# Patient Record
Sex: Male | Born: 1950 | Race: Black or African American | Hispanic: No | Marital: Married | Smoking: Former smoker
Health system: Southern US, Community
[De-identification: ages and names within clinical notes are randomized; demographics above are authoritative.]

## PROBLEM LIST (undated history)

## (undated) DIAGNOSIS — I10 Essential (primary) hypertension: Secondary | ICD-10-CM

## (undated) DIAGNOSIS — C801 Malignant (primary) neoplasm, unspecified: Secondary | ICD-10-CM

## (undated) DIAGNOSIS — E119 Type 2 diabetes mellitus without complications: Secondary | ICD-10-CM

## (undated) DIAGNOSIS — R0600 Dyspnea, unspecified: Secondary | ICD-10-CM

## (undated) HISTORY — PX: CARDIAC SURGERY: SHX584

## (undated) HISTORY — PX: OTHER SURGICAL HISTORY: SHX169

---

## 1997-08-03 ENCOUNTER — Emergency Department (HOSPITAL_COMMUNITY): Admission: EM | Admit: 1997-08-03 | Discharge: 1997-08-03 | Payer: Self-pay | Admitting: Emergency Medicine

## 1997-08-16 ENCOUNTER — Emergency Department (HOSPITAL_COMMUNITY): Admission: EM | Admit: 1997-08-16 | Discharge: 1997-08-16 | Payer: Self-pay | Admitting: Emergency Medicine

## 1997-10-16 ENCOUNTER — Ambulatory Visit (HOSPITAL_COMMUNITY): Admission: RE | Admit: 1997-10-16 | Discharge: 1997-10-16 | Payer: Self-pay | Admitting: Pulmonary Disease

## 1997-10-23 ENCOUNTER — Ambulatory Visit (HOSPITAL_COMMUNITY): Admission: RE | Admit: 1997-10-23 | Discharge: 1997-10-23 | Payer: Self-pay | Admitting: Pulmonary Disease

## 1998-10-21 ENCOUNTER — Emergency Department (HOSPITAL_COMMUNITY): Admission: EM | Admit: 1998-10-21 | Discharge: 1998-10-21 | Payer: Self-pay | Admitting: Emergency Medicine

## 1999-08-06 ENCOUNTER — Ambulatory Visit (HOSPITAL_COMMUNITY): Admission: RE | Admit: 1999-08-06 | Discharge: 1999-08-06 | Payer: Self-pay | Admitting: Pulmonary Disease

## 1999-08-06 ENCOUNTER — Encounter: Payer: Self-pay | Admitting: Pulmonary Disease

## 2000-07-06 ENCOUNTER — Emergency Department (HOSPITAL_COMMUNITY): Admission: EM | Admit: 2000-07-06 | Discharge: 2000-07-06 | Payer: Self-pay

## 2001-07-07 ENCOUNTER — Encounter: Payer: Self-pay | Admitting: Pulmonary Disease

## 2001-07-07 ENCOUNTER — Ambulatory Visit (HOSPITAL_COMMUNITY): Admission: RE | Admit: 2001-07-07 | Discharge: 2001-07-07 | Payer: Self-pay | Admitting: Pulmonary Disease

## 2001-11-15 ENCOUNTER — Encounter: Payer: Self-pay | Admitting: Pulmonary Disease

## 2001-11-15 ENCOUNTER — Ambulatory Visit (HOSPITAL_COMMUNITY): Admission: RE | Admit: 2001-11-15 | Discharge: 2001-11-15 | Payer: Self-pay | Admitting: Pulmonary Disease

## 2003-01-23 ENCOUNTER — Ambulatory Visit: Admission: RE | Admit: 2003-01-23 | Discharge: 2003-04-23 | Payer: Self-pay | Admitting: Radiation Oncology

## 2003-04-24 ENCOUNTER — Ambulatory Visit: Admission: RE | Admit: 2003-04-24 | Discharge: 2003-07-23 | Payer: Self-pay | Admitting: Radiation Oncology

## 2003-05-30 ENCOUNTER — Ambulatory Visit (HOSPITAL_COMMUNITY): Admission: RE | Admit: 2003-05-30 | Discharge: 2003-05-30 | Payer: Self-pay | Admitting: Pulmonary Disease

## 2003-11-07 ENCOUNTER — Ambulatory Visit: Admission: RE | Admit: 2003-11-07 | Discharge: 2003-11-07 | Payer: Self-pay | Admitting: Radiation Oncology

## 2004-03-18 ENCOUNTER — Emergency Department (HOSPITAL_COMMUNITY): Admission: EM | Admit: 2004-03-18 | Discharge: 2004-03-18 | Payer: Self-pay | Admitting: Emergency Medicine

## 2005-12-30 ENCOUNTER — Emergency Department (HOSPITAL_COMMUNITY): Admission: EM | Admit: 2005-12-30 | Discharge: 2005-12-30 | Payer: Self-pay | Admitting: Emergency Medicine

## 2006-12-07 ENCOUNTER — Emergency Department (HOSPITAL_COMMUNITY): Admission: EM | Admit: 2006-12-07 | Discharge: 2006-12-07 | Payer: Self-pay | Admitting: Emergency Medicine

## 2007-01-27 ENCOUNTER — Inpatient Hospital Stay (HOSPITAL_COMMUNITY): Admission: EM | Admit: 2007-01-27 | Discharge: 2007-01-31 | Payer: Self-pay | Admitting: Emergency Medicine

## 2007-02-18 ENCOUNTER — Encounter (HOSPITAL_COMMUNITY): Admission: RE | Admit: 2007-02-18 | Discharge: 2007-05-05 | Payer: Self-pay | Admitting: Cardiology

## 2007-03-12 ENCOUNTER — Ambulatory Visit (HOSPITAL_COMMUNITY): Admission: RE | Admit: 2007-03-12 | Discharge: 2007-03-12 | Payer: Self-pay | Admitting: Cardiology

## 2007-05-06 ENCOUNTER — Encounter (HOSPITAL_COMMUNITY): Admission: RE | Admit: 2007-05-06 | Discharge: 2007-07-03 | Payer: Self-pay | Admitting: Cardiology

## 2008-02-25 ENCOUNTER — Ambulatory Visit (HOSPITAL_COMMUNITY): Admission: RE | Admit: 2008-02-25 | Discharge: 2008-02-25 | Payer: Self-pay | Admitting: Cardiology

## 2008-12-16 ENCOUNTER — Observation Stay (HOSPITAL_COMMUNITY): Admission: EM | Admit: 2008-12-16 | Discharge: 2008-12-19 | Payer: Self-pay | Admitting: Emergency Medicine

## 2010-03-07 ENCOUNTER — Ambulatory Visit (HOSPITAL_COMMUNITY): Admission: RE | Admit: 2010-03-07 | Discharge: 2010-03-07 | Payer: Self-pay | Admitting: Pulmonary Disease

## 2010-06-13 ENCOUNTER — Encounter (HOSPITAL_COMMUNITY)
Admission: RE | Admit: 2010-06-13 | Discharge: 2010-06-13 | Disposition: A | Discharge status: Home / Self Care | Payer: BC Managed Care – PPO | Source: Ambulatory Visit | Attending: Orthopaedic Surgery | Admitting: Orthopaedic Surgery

## 2010-06-13 ENCOUNTER — Ambulatory Visit (HOSPITAL_COMMUNITY)
Admission: RE | Admit: 2010-06-13 | Discharge: 2010-06-13 | Disposition: A | Discharge status: Home / Self Care | Payer: BC Managed Care – PPO | Source: Ambulatory Visit | Attending: Orthopaedic Surgery | Admitting: Orthopaedic Surgery

## 2010-06-13 ENCOUNTER — Other Ambulatory Visit (HOSPITAL_COMMUNITY): Payer: Self-pay | Admitting: Orthopaedic Surgery

## 2010-06-13 DIAGNOSIS — M48061 Spinal stenosis, lumbar region without neurogenic claudication: Secondary | ICD-10-CM

## 2010-06-13 DIAGNOSIS — Z01818 Encounter for other preprocedural examination: Secondary | ICD-10-CM | POA: Insufficient documentation

## 2010-06-13 LAB — SURGICAL PCR SCREEN
MRSA, PCR: NEGATIVE
Staphylococcus aureus: POSITIVE — AB

## 2010-06-13 LAB — COMPREHENSIVE METABOLIC PANEL
AST: 25 U/L (ref 0–37)
Alkaline Phosphatase: 51 U/L (ref 39–117)
Calcium: 9.3 mg/dL (ref 8.4–10.5)
Creatinine, Ser: 1.05 mg/dL (ref 0.4–1.5)
Glucose, Bld: 145 mg/dL — ABNORMAL HIGH (ref 70–99)
Potassium: 4.7 mEq/L (ref 3.5–5.1)
Total Bilirubin: 0.9 mg/dL (ref 0.3–1.2)
Total Protein: 6.2 g/dL (ref 6.0–8.3)

## 2010-06-13 LAB — DIFFERENTIAL
Basophils Relative: 1 % (ref 0–1)
Eosinophils Absolute: 0.1 10*3/uL (ref 0.0–0.7)
Eosinophils Relative: 2 % (ref 0–5)
Neutro Abs: 4.7 10*3/uL (ref 1.7–7.7)
Neutrophils Relative %: 57 % (ref 43–77)

## 2010-06-13 LAB — CBC
MCH: 33.2 pg (ref 26.0–34.0)
RDW: 12.8 % (ref 11.5–15.5)
WBC: 8.2 10*3/uL (ref 4.0–10.5)

## 2010-06-14 ENCOUNTER — Other Ambulatory Visit (HOSPITAL_COMMUNITY): Payer: Self-pay | Admitting: Orthopaedic Surgery

## 2010-06-14 ENCOUNTER — Observation Stay (HOSPITAL_COMMUNITY)
Admission: RE | Admit: 2010-06-14 | Discharge: 2010-06-15 | Disposition: A | Discharge status: Home / Self Care | Payer: BC Managed Care – PPO | Source: Ambulatory Visit | Attending: Orthopaedic Surgery | Admitting: Orthopaedic Surgery

## 2010-06-14 ENCOUNTER — Ambulatory Visit (HOSPITAL_COMMUNITY)
Admission: RE | Admit: 2010-06-14 | Discharge: 2010-06-14 | Disposition: A | Discharge status: Home / Self Care | Payer: BC Managed Care – PPO | Source: Ambulatory Visit | Attending: Orthopaedic Surgery | Admitting: Orthopaedic Surgery

## 2010-06-14 DIAGNOSIS — M545 Low back pain, unspecified: Secondary | ICD-10-CM

## 2010-06-14 DIAGNOSIS — M48061 Spinal stenosis, lumbar region without neurogenic claudication: Secondary | ICD-10-CM | POA: Insufficient documentation

## 2010-06-14 DIAGNOSIS — Z01810 Encounter for preprocedural cardiovascular examination: Secondary | ICD-10-CM | POA: Insufficient documentation

## 2010-06-14 DIAGNOSIS — M8569 Other cyst of bone, multiple sites: Principal | ICD-10-CM | POA: Insufficient documentation

## 2010-06-14 LAB — GLUCOSE, CAPILLARY
Glucose-Capillary: 171 mg/dL — ABNORMAL HIGH (ref 70–99)
Glucose-Capillary: 179 mg/dL — ABNORMAL HIGH (ref 70–99)

## 2010-06-14 LAB — PROTIME-INR: Prothrombin Time: 12.7 seconds (ref 11.6–15.2)

## 2010-06-14 LAB — APTT: aPTT: 34 seconds (ref 24–37)

## 2010-06-15 LAB — GLUCOSE, CAPILLARY

## 2010-06-17 LAB — GLUCOSE, CAPILLARY: Glucose-Capillary: 124 mg/dL — ABNORMAL HIGH (ref 70–99)

## 2010-06-18 NOTE — Op Note (Signed)
Zachary Knox, Zachary Knox               ACCOUNT NO.:  [id number]  MEDICAL RECORD NO.:  [id number]           PATIENT TYPE:  I  LOCATION:  5015                         FACILITY:  [facility]  PHYSICIAN:  Mark C. Ophelia Charter, M.D.    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  PROCEDURES:  Left L3 partial hemilaminotomy.  Foraminotomy and removal of compressive facet cyst.  SURGEON:  Mark C. Ophelia Charter, MD  ASSISTANT:  Wende Neighbors, PA-C  ANESTHESIA:  GOT plus Marcaine skin local.  This 60 year old male has had progressive increased back pain, leg pain, and left leg weakness and over the last 6-9 months has had progressive enlargement of facet cyst, now causing severe stenosis which is really pseudostenosis from the cyst.  There was no contralateral right foraminal stenosis.  He had increasing pain, weakness, and leg giving way in his left quad.  PROCEDURE:  After induction of general anesthesia, the patient was placed in prone position, chest rolls, careful padding and positioning. Back was prepped with DuraPrep.  Preoperative Ancef was given.  Area was squared with towels.  Betadine and Steri-Drape were applied after the standard DuraPrep and squaring it with towels.  Laminectomy sheet, spinal needle placed, 3-4 cross-table lateral x-ray.  Cross-table lateral confirmed that it was at 3-4 level.  A midline incision was made.  Foraminotomy was performed on the left.  Lamina was thinned with the bur.  2 and 3 mm Kerrisons were used to remove the remaining portions of the lamina and then the wall out to the level of pedicle. There were thick chunks of ligamentum which were carefully removed after probing with the operative microscope draped and  brought in to make sure that there was not any adherent dura.  The cyst was large and actually cyst itself was larger than the size of the dura by MRI was causing significant compression.  Microdissection developed the layer where it was somewhat adherent to the dura, released some scar tissue once it was isolated.  Chunks were then removed with the large pituitary and 3 and 4- mm Kerrison with the dura, gently protected with the D'Errico. Laminotomy was removed above and below, and the entire top to bottom portion of the disk was visualized and halfway up the vertebral body on the floor to make sure that there was no remaining cyst cephalad or caudad to cause any remaining compression.  Foramen was enlarged passing with a hockey stick underneath the nerve root, on the shoulder of the nerve root was performed.  No central area of compression.  Disk was flat.  No microdiskectomy was performed.  Copious irrigation was then gained with palpation.  Operative site was dry.  Some veins in the lateral gutter were coagulated with bipolar during the case under microscopic visualization.  Dura was intact, round, free.  No areas of compression along the lateral gutter.  Irrigation with saline  solution in standard layered closure with 0 Vicryl on the deep fascia, 2- 0 on the subcutaneous tissue, subcuticular skin closure.  Tincture of benzoin and Steri-Strips, Marcaine infiltration, postop dressing, and transferred to the recovery room.  A time-out procedure was completed at the time of closure.     Mark C. Ophelia Charter, M.D.     MCY/MEDQ  D:  06/14/2010  T:  06/15/2010  Job:  [id number]  Electronically Signed by Annell Greening M.D. on 06/18/2010 05:07:43 PM

## 2010-08-10 LAB — GLUCOSE, CAPILLARY
Glucose-Capillary: 118 mg/dL — ABNORMAL HIGH (ref 70–99)
Glucose-Capillary: 148 mg/dL — ABNORMAL HIGH (ref 70–99)
Glucose-Capillary: 149 mg/dL — ABNORMAL HIGH (ref 70–99)
Glucose-Capillary: 149 mg/dL — ABNORMAL HIGH (ref 70–99)

## 2010-08-10 LAB — HEPARIN LEVEL (UNFRACTIONATED): Heparin Unfractionated: 0.12 IU/mL — ABNORMAL LOW (ref 0.30–0.70)

## 2010-08-10 LAB — CBC
RBC: 4.38 MIL/uL (ref 4.22–5.81)
WBC: 6.6 10*3/uL (ref 4.0–10.5)

## 2010-08-11 LAB — URINALYSIS, ROUTINE W REFLEX MICROSCOPIC
Bilirubin Urine: NEGATIVE
Ketones, ur: NEGATIVE mg/dL
Nitrite: NEGATIVE
Protein, ur: NEGATIVE mg/dL
Specific Gravity, Urine: 1.022 (ref 1.005–1.030)
Urobilinogen, UA: 1 mg/dL (ref 0.0–1.0)

## 2010-08-11 LAB — CBC
HCT: 44.1 % (ref 39.0–52.0)
Hemoglobin: 15.1 g/dL (ref 13.0–17.0)
MCHC: 34.3 g/dL (ref 30.0–36.0)
MCHC: 34.4 g/dL (ref 30.0–36.0)
MCV: 98 fL (ref 78.0–100.0)
MCV: 99.2 fL (ref 78.0–100.0)
Platelets: 237 10*3/uL (ref 150–400)
RBC: 4.45 MIL/uL (ref 4.22–5.81)
RDW: 13.7 % (ref 11.5–15.5)
RDW: 14.2 % (ref 11.5–15.5)

## 2010-08-11 LAB — DIFFERENTIAL
Eosinophils Relative: 3 % (ref 0–5)
Lymphocytes Relative: 17 % (ref 12–46)
Lymphs Abs: 1.5 10*3/uL (ref 0.7–4.0)

## 2010-08-11 LAB — GLUCOSE, CAPILLARY
Glucose-Capillary: 156 mg/dL — ABNORMAL HIGH (ref 70–99)
Glucose-Capillary: 159 mg/dL — ABNORMAL HIGH (ref 70–99)
Glucose-Capillary: 159 mg/dL — ABNORMAL HIGH (ref 70–99)

## 2010-08-11 LAB — HEMOGLOBIN A1C
Hgb A1c MFr Bld: 7.4 % — ABNORMAL HIGH (ref 4.6–6.1)
Mean Plasma Glucose: 166 mg/dL

## 2010-08-11 LAB — HEPARIN LEVEL (UNFRACTIONATED)
Heparin Unfractionated: 0.72 IU/mL — ABNORMAL HIGH (ref 0.30–0.70)
Heparin Unfractionated: 0.94 IU/mL — ABNORMAL HIGH (ref 0.30–0.70)

## 2010-08-11 LAB — LIPASE, BLOOD: Lipase: 27 U/L (ref 11–59)

## 2010-08-11 LAB — POCT CARDIAC MARKERS
CKMB, poc: <1 ng/mL — ABNORMAL LOW (ref 1.0–8.0)
Troponin i, poc: <0.05 ng/mL (ref 0.00–0.09)

## 2010-08-11 LAB — COMPREHENSIVE METABOLIC PANEL
AST: 27 U/L (ref 0–37)
Albumin: 3.7 g/dL (ref 3.5–5.2)
CO2: 25 mEq/L (ref 19–32)
Calcium: 9.3 mg/dL (ref 8.4–10.5)
Creatinine, Ser: 1.09 mg/dL (ref 0.4–1.5)
GFR calc Af Amer: >60 mL/min (ref >60)
GFR calc non Af Amer: >60 mL/min (ref >60)

## 2010-08-11 LAB — CARDIAC PANEL(CRET KIN+CKTOT+MB+TROPI): Troponin I: 0.03 ng/mL (ref 0.00–0.06)

## 2010-08-11 LAB — LIPID PANEL
HDL: 34 mg/dL — ABNORMAL LOW (ref >39)
Total CHOL/HDL Ratio: 3.1 RATIO

## 2010-08-11 LAB — CK TOTAL AND CKMB (NOT AT ARMC): CK, MB: 2.7 ng/mL (ref 0.3–4.0)

## 2010-08-11 LAB — TROPONIN I: Troponin I: 0.02 ng/mL (ref 0.00–0.06)

## 2010-09-10 ENCOUNTER — Ambulatory Visit (HOSPITAL_COMMUNITY)
Admission: RE | Admit: 2010-09-10 | Discharge: 2010-09-10 | Disposition: A | Discharge status: Home / Self Care | Payer: BC Managed Care – PPO | Source: Ambulatory Visit | Attending: Cardiology | Admitting: Cardiology

## 2010-09-10 DIAGNOSIS — E119 Type 2 diabetes mellitus without complications: Secondary | ICD-10-CM | POA: Insufficient documentation

## 2010-09-10 DIAGNOSIS — Z9861 Coronary angioplasty status: Secondary | ICD-10-CM | POA: Insufficient documentation

## 2010-09-10 DIAGNOSIS — I252 Old myocardial infarction: Secondary | ICD-10-CM | POA: Insufficient documentation

## 2010-09-10 DIAGNOSIS — R0602 Shortness of breath: Secondary | ICD-10-CM | POA: Insufficient documentation

## 2010-09-10 DIAGNOSIS — I2510 Atherosclerotic heart disease of native coronary artery without angina pectoris: Secondary | ICD-10-CM | POA: Insufficient documentation

## 2010-09-10 DIAGNOSIS — F17200 Nicotine dependence, unspecified, uncomplicated: Secondary | ICD-10-CM | POA: Insufficient documentation

## 2010-09-10 DIAGNOSIS — Z8249 Family history of ischemic heart disease and other diseases of the circulatory system: Secondary | ICD-10-CM | POA: Insufficient documentation

## 2010-09-10 DIAGNOSIS — I10 Essential (primary) hypertension: Secondary | ICD-10-CM | POA: Insufficient documentation

## 2010-09-10 DIAGNOSIS — E7800 Pure hypercholesterolemia, unspecified: Secondary | ICD-10-CM | POA: Insufficient documentation

## 2010-09-10 DIAGNOSIS — E039 Hypothyroidism, unspecified: Secondary | ICD-10-CM | POA: Insufficient documentation

## 2010-09-10 DIAGNOSIS — R079 Chest pain, unspecified: Secondary | ICD-10-CM | POA: Insufficient documentation

## 2010-09-10 LAB — GLUCOSE, CAPILLARY: Glucose-Capillary: 196 mg/dL — ABNORMAL HIGH (ref 70–99)

## 2010-09-17 NOTE — Cardiovascular Report (Signed)
Zachary Knox               ACCOUNT NO.:  [id number]   MEDICAL RECORD NO.:  [id number]          PATIENT TYPE:  INP   LOCATION:  3712                         FACILITY:  [facility]   PHYSICIAN:  Zachary Knox, M.D. DATE OF BIRTH:  1951-04-19   DATE OF PROCEDURE:  01/27/2007  DATE OF DISCHARGE:  01/31/2007                            CARDIAC CATHETERIZATION   PROCEDURE:  1. Left cardiac catheterization.  2. Selective left and right coronary angiography.  3. Left ventriculography via right groin using Judkins technique.   INDICATIONS FOR PROCEDURE:  Zachary Knox is a 60 year old black  male with past medical history significant for hypertension,  hypercholesteremia, hypothyroidism, tobacco abuse who came to the ER  complaining of retrosternal chest pain localized, grade 8/10 while  moving chairs without associated symptoms.  The patient received aspirin  and nitroglycerin without relief and then received morphine sulfate with  relief of chest pain.  Denies any shortness of breath, palpitation,  lightheadedness or syncope.  Denies such episodes of chest pain in the  past.  EKG done in the ER showed normal sinus rhythm with septal Q waves  and reverse R wave progression in V1-V3. States chest pain gets better  after burping. The patient was noted to have mildly elevated CPK-MB and  troponin I suggestive of non-Q-wave myocardial infarction. Due to  multiple risk factors and elevated cardiac enzymes, discussed with the  patient regarding left catheterization, possible PTCA stenting, its  risks and benefits; i.e., death, MI, stroke, need for emergency CABG,  risk of restenosis, local vascular complications, etc., and consented  for the procedure.   DESCRIPTION OF PROCEDURE:  After obtaining informed consent, the patient  was brought to the catheterization lab and was placed on fluoroscopy  table.  The right groin was prepped and draped in usual fashion; 2%  Xylocaine was used for  local anesthesia in the right groin. With the  help of thin-wall needle, a 6-French arterial sheath was placed.  The  sheath was aspirated and flushed.  Next, 6-French left Judkins catheter  was advanced over the wire under fluoroscopic guidance up to the  ascending aorta.  Wire was pulled out. The catheter was aspirated and  connected to the manifold.  Catheter was further advanced and engaged  into left coronary ostium.  Multiple views of the left system were  taken. Next, the catheter was disengaged and was pulled out over the  wire and was replaced with 6-French right Judkins catheter which was  advanced over the wire under fluoroscopic guidance to the ascending  aorta.  Wire was pulled out. The catheter was aspirated and connected to  the manifold.  Catheter was further advanced and engaged into right  coronary ostium.  Multiple views of the right system were taken.  Next,  the catheter was disengaged and was pulled out over the wire and was  replaced with 6-French pigtail catheter which was advanced over the wire  under fluoroscopic guidance up to the ascending aorta.  Wire was pulled  out. The catheter was aspirated and connected to the manifold.  Catheter  was further  advanced across aortic valve into the LV.  LV pressures were  recorded.  Next, left ventriculography was done in 30-degree RAO  position.  Post angiographic pressures were recorded from LV, and then  pullback pressures were recorded from the aorta.  There was no gradient  across the aortic valve.  Next, pigtail catheter was pulled out.  Sheaths were aspirated and flushed.   FINDINGS:  LV showed good  LV systolic function.  There was mild  inferior wall hypokinesia, EF of 50-55%.   Left main was patent.   LAD has 40% proximal stenosis and 20-30% mid stenosis and 50-60% ostial  diagonal-1 stenosis.   Left circumflex was 100% occluded proximally with TIMI-0 flow.  RCA has  15-20% proximal and 10-15% distal stenoses  providing collaterals to the  distal left circumflex. PDA has 20-30% distal stenosis.   INTERVENTIONAL PROCEDURE:  Successful PTCA to proximal 100% occluded  left circumflex done using 2.5 x 8 mm long Voyager balloon for  predilatation, and then 3.0 x 16 mm long TAXUS drug-eluting stent was  deployed at 15 atmospheres pressure in proximal left circumflex.  The  proximal left circumflex stent was post dilated using 3.25 x 8 mm long  PowerSail balloon going up to 18 atmospheres pressure.  Angiogram showed  filling defect beyond the distal edge of the stent at the bifurcation  with OM-1 and then PTCA to the left circumflex done using 3.0 x 8 mm  long Voyager balloon going up to 6 atmospheres pressure.  Angiogram  showed TIMI-3 distal flow without evidence of dissection or distal  embolization with persistent minimal haziness.  Lesion was dilated from  100% to 0% residual with excellent TIMI grade 3 distal flow.  The  patient received weight-based heparin, Integrilin, and total of 600 mg  of Plavix during the procedure.  The patient tolerated procedure well.  There are no complications.  The patient was transferred to the recovery  room in stable condition.      Zachary Knox. Zachary Knox, M.D.  Electronically Signed     MNH/MEDQ  D:  01/31/2007  T:  01/31/2007  Job:  [id number]   cc:   Zachary Knox, M.D.  Catheterization Lab

## 2010-09-17 NOTE — Discharge Summary (Signed)
NAMEPARKE, JANDREAU               ACCOUNT NO.:  [id number]   MEDICAL RECORD NO.:  [id number]          PATIENT TYPE:  INP   LOCATION:  3712                         FACILITY:  [facility]   PHYSICIAN:  Mohan N. Sharyn Lull, M.D. DATE OF BIRTH:  14-Dec-1950   DATE OF ADMISSION:  01/26/2007  DATE OF DISCHARGE:  01/31/2007                               DISCHARGE SUMMARY   ADMITTING DIAGNOSES:  1. Atypical chest, pain rule out myocardial infarction.  2. Hypertension.  3. Hypercholesteremia.  4. Tobacco abuse.  5. Positive family history of coronary artery disease.  6. Hypothyroidism.   DISCHARGE DIAGNOSIS:  1. Status post non-Q-wave myocardial infarction, status post      percutaneous transluminal cardiac angioplasty and stenting to left      circumflex.  2. Hypertension.  3. Hypercholesteremia.  4. Hypothyroidism.  5. Tobacco abuse.  6. Positive family history of coronary artery disease.   DISCHARGE HOME MEDICATIONS:  1. Enteric-coated aspirin 325 mg 1 tablet daily.  2. Plavix 75 mg 1 tablet daily with food.  3. Toprol XL 50 mg 1 tablet daily.  4. Altace 2.5 mg 1 capsule daily.  5. Lipitor 20 mg 1 tablet daily.  6. Zantac 150 mg twice daily.  7. Synthroid 100 mcg 1 tablet daily.  8. Nitrostat 0.4 mg sublingual. Use as directed.   DIET:  Low salt, low cholesterol.   Post PTCA and stent instructions have been given.   ACTIVITY:  Increase activity slowly but avoid any lifting, pushing or  pulling for 1 week.   FOLLOWUP:  Follow up with me in 1 week and Dr. Petra Kuba as scheduled.   CONDITION AT DISCHARGE:  Stable.   BRIEF HISTORY AND HOSPITAL COURSE:  Zachary Knox is a 60 year old  black male with past medical history significant for hypertension,  hypercholesteremia, hypothyroidism, tobacco abuse who came to the ER  complaining of retrosternal chest pain localized, grade 8/10 while  moving chairs without associated symptoms.  The patient received aspirin  and nitroglycerin  without relief and then received morphine sulfate with  relief of chest pain.  Denies any shortness of breath, palpitations,  lightheadedness or syncope.  Denies such episodes of chest pain in the  past.  EKG done in the ER showed normal sinus rhythm with septal Q-waves  with reverse R-wave progression in V1-V3.  Denies any cough, fever,  chills.  States chest pain feels better after burping.   PAST MEDICAL HISTORY:  As above.   PAST SURGICAL HISTORY:  None.   ALLERGIES:  None.   MEDICATION AT HOME:  He is on Zantac, Bentyl, Benicar, Synthroid and  Lipitor.   SOCIAL HISTORY:  He is single, one child.  Smoked 1/2 pack per day for  38 years.  No history of alcohol abuse.  Used to drink socially.  He  works as Naval architect   FAMILY HISTORY:  Father died of MI at the age of 18.  Mother died of  ruptured aneurysm at age of 44.  One sister had heart problems and  pacemaker.   PHYSICAL EXAMINATION:  GENERAL:  He was alert,  awake, oriented x3 in no  acute distress.  VITAL SIGNS:  Blood pressure was 117/73, pulse 69 and regular.  HEENT:  Conjunctivae was pink.  NECK:  Supple, no JVD, no bruit.  LUNGS:  Clear to auscultation without rhonchi or rales.  CARDIOVASCULAR:  S1 and S2 were normal.  There was soft systolic murmur.  ABDOMEN:  Soft.  Bowel sounds were present, nontender.  EXTREMITIES:  There was no clubbing, cyanosis or edema.   LABORATORY DATA THIS ADMISSION:  CPK-MB was 1.3; second was 1.1.  Troponin I two sets were 0.05 x2. Creatinine was 0.9.  Repeat CPK was  278, MB of 16.3, relative index 5.9.  Troponin I was 0.24.  His next CK  was 1531, MB 230, and relative index 15.1. Potassium was 4.0, BUN 9,  creatinine 1.0.  Troponin I was 29.16.  Today next set CPK was 598, MB  21.5, relative index 4.2.  Troponin I was 12.07.  Next CPK was 278, MB  4.1, troponin 10.89.  Today labs show CPK 197, MB 2.0.  Troponin I was  5.99.  TSH is 0.14,   BRIEF HOSPITAL COURSE:  The patient on  was taken to the catheterization  lab and had PTCA and stenting to left circumflex as per procedure  report.  The patient tolerated procedure well.  There were no  complications..  Phase I cardiac rehab was called.  The patient has been  ambulating in hallway without any problems.  Postprocedure, the patient  did not have any episodes of chest pain during the hospital stay.  His  groin is stable.  He has no evidence of hematoma or bruit.  The patient  will be discharged home on above medications and will be followed up in  my office in 1 week and with Dr. Petra Kuba as scheduled.  The patient  will be referred for phase II cardiac rehab as outpatient.     Eduardo Osier. Sharyn Lull, M.D.  Electronically Signed    MNH/MEDQ  D:  01/31/2007  T:  01/31/2007  Job:  [id number]   cc:   Mina Marble, M.D.

## 2010-09-17 NOTE — Discharge Summary (Signed)
NAMEETIENNE, MOWERS               ACCOUNT NO.:  [id number]   MEDICAL RECORD NO.:  [id number]          PATIENT TYPE:  OBV   LOCATION:  3729                         FACILITY:  [facility]   PHYSICIAN:  Mohan N. Sharyn Lull, M.D. DATE OF BIRTH:  02-May-1951   DATE OF ADMISSION:  12/16/2008  DATE OF DISCHARGE:  12/19/2008                               DISCHARGE SUMMARY   ADMITTING DIAGNOSES:  1. Unstable angina.  2. Rule out myocardial infarction.  3. Coronary artery disease.  4. 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  5. History of percutaneous transluminal coronary angioplasty stenting      to 100% occluded left circumflex.  6. Hypercholesteremia.  7. Hypertension.  8. Hypothyroidism.  9. Tobacco abuse.   FINAL DIAGNOSES:  1. Stable angina.  2. Myocardial infarction ruled out.  3. Negative Persantine Myoview.  4. Coronary artery disease.  5. 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  6. Status post percutaneous coronary intervention to 100% occluded      left circumflex.  7. Hypertension.  8. New onset diabetes mellitus.  9. Hypercholesteremia.  10.Hypothyroidism.  11.Tobacco abuse.   DISCHARGE HOME MEDICATIONS:  1. Enteric-coated aspirin 81 mg 1 tablet daily.  2. Plavix 75 mg 1 tablet daily with food.  3. Toprol-XL 50 mg 1 tablet daily.  4. Lipitor 20 mg 1 tablet daily.  5. Trilipix 135 mg 1 tablet daily.  6. Protonix 40 mg 1 tablet daily.  7. Levothyroxine 150 mcg 1 tablet daily.  8. Nitrostat 0.4 mg sublingual use as directed.  9. Metformin 500 mg 1 tablet twice daily.   DIET:  Low salt and low cholesterol, 1800 calories ADA diet.   ACTIVITY:  Increase activity as tolerated.   DISCHARGE INSTRUCTIONS:  Monitor blood sugar twice daily and chart.   FOLLOWUP:  Follow up with me in 1 week.   CONDITION AT DISCHARGE:  Stable.   BRIEF HISTORY AND HOSPITAL COURSE:  Mr. Baskette is a 60 year old black  male with past medical history  significant for coronary artery disease,  history of non-Q-wave myocardial infarction, hypertension,  hypothyroidism, tobacco abuse, and hypercholesteremia, was admitted by  Dr. Shana Chute on December 16, 2008, because of retrosternal chest pain.  He  described the chest pain as discomfort in the substernal region  radiating to the right side associated with mild dizziness.  There was  no palpitation, lightheadedness, or syncope.  The patient received  sublingual nitro in ED with partial relief of chest pain.  The patient  had EKG done, which showed no evidence of acute ischemic changes.  The  patient was admitted for further evaluation.   PAST MEDICAL HISTORY:  As above.   PAST SURGICAL HISTORY:  He continues to smoke occasionally.  No history  of alcohol abuse.   FAMILY HISTORY:  Positive for coronary artery disease.   PHYSICAL EXAMINATION:  GENERAL:  He is alert, awake, and oriented x3.  VITAL SIGNS:  Blood pressure was 116/91 and pulse was 74.  HEENT:  Unremarkable.  NECK:  No JVD or no bruit.  LUNGS:  Clear to auscultation.  CARDIOVASCULAR:  S1 and S2 was normal.  There was no S3 gallop or rub.  ABDOMEN:  Soft.  No masses.  EXTREMITIES:  There is no clubbing, cyanosis, or edema.   LABORATORIES:  Admission hemoglobin was 15.4, hematocrit 44.9, and white  count of 8.8.  Sodium was 136, potassium 4.0, glucose 155, BUN 15, and  creatinine 1.09.  Three sets of cardiac enzymes were negative.  Cholesterol was 106, triglyceride 160, HDL 34, and LDL of 40.  His  hemoglobin A1c was 7.4.  His CBGs during the hospital stay remained  between 120-160.   BRIEF HOSPITAL COURSE:  The patient was admitted to telemetry unit.  MI  was ruled out by serial enzymes and EKG.  The patient did not have any  further episodes of chest pain during the hospital stay.  The patient  underwent Persantine Myoview yesterday, which showed no evidence of  infarction or ischemia with normal EF of 61%.  The patient  has been  ambulating in the hallway without any problems.  The patient has been  counseled about stopping  the tobacco abuse to which he agrees.  The patient will be followed up  in my office in 1 week.  The patient has been advised to monitor blood  sugar twice daily and chart, and will be monitored closely as an  outpatient.  The patient will be scheduled for his outpatient diabetic  education classes.      Eduardo Osier. Sharyn Lull, M.D.  Electronically Signed     Eduardo Osier. Sharyn Lull, M.D.  Electronically Signed    MNH/MEDQ  D:  12/19/2008  T:  12/19/2008  Job:  [id number]

## 2010-09-22 NOTE — Cardiovascular Report (Signed)
Zachary Knox, Zachary Knox               ACCOUNT NO.:  [id number]  MEDICAL RECORD NO.:  [id number]           PATIENT TYPE:  O  LOCATION:  [facility]                         FACILITY:  [facility]  PHYSICIAN:  Traye Bates N. Sharyn Lull, M.D. DATE OF BIRTH:  07-Mar-1951  DATE OF PROCEDURE:  09/10/2010 DATE OF DISCHARGE:                           CARDIAC CATHETERIZATION   PROCEDURES: 1. Left cardiac catheterization with selective left and right coronary     angiography, left ventriculography via right groin using Judkins     technique. 2. Successful closure of arteriotomy using ProGlide Perclose without     complications.  INDICATIONS FOR PROCEDURE:  Mr. 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  States also lately gets short of breath with minimal exertion associated with feeling weak, tired, and fatigued, and no energy and have to stop while working.  Denies any palpitation, lightheadedness, or syncope.  Denies PND, orthopnea, or leg swelling.  Denies relation of chest pain to food, breathing, or movement.  PAST MEDICAL HISTORY:  As above.  PAST SURGICAL HISTORY:  None.  ALLERGIES:  No known drug allergies.  MEDICATIONS:  At home he is on; 1. Enteric-coated aspirin 81 mg p.o. daily. 2. Plavix 75 mg p.o. daily, which was recently stopped and then     restarted. 3. Toprol-XL 50 mg p.o. daily. 4. Lipitor 20 mg p.o. daily. 5. Metformin 500 mg p.o. b.i.d. 6. Synthroid 150 mcg p.o. daily. 7. Nitrostat sublingual p.r.n.  SOCIAL HISTORY:  He is married, has one child.  Smoked half pack per day for 38 years.  No history of alcohol abuse.  FAMILY HISTORY:  Father  died of MI at the age of 11.  Mother died of ruptured aneurysm at the age of 42.  One sister has heart problem and permanent pacemaker.  PHYSICAL EXAMINATION:  GENERAL:  He is alert, awake, and oriented x3, in no acute distress. VITAL SIGNS:  Blood pressure is 130/80, pulse of 68. HEENT:  Conjunctivae pink. NECK:  Supple.  No JVD, no bruit. LUNGS:  Clear to auscultation without rhonchi or rales. CARDIOVASCULAR:  S1, S2 is normal.  There is soft systolic murmur. ABDOMEN:  Soft.  Bowel sounds are present, nontender. EXTREMITIES:  There is no clubbing, cyanosis, or edema.  IMPRESSION: 1. New-onset angina, rule out progression of coronary artery disease. 2. Coronary artery disease. 3. History of non-Q-wave myocardial infarction in the past status post     percutaneous coronary intervention to left circumflex in the past. 4. Hypertension. 5. Non-insulin-dependent diabetes mellitus. 6. Hypothyroidism. 7. Hypercholesteremia. 8. Tobacco abuse. 9. Positive family history of coronary artery disease.  Discussed with the patient regarding noninvasive stress testing versus left cath, possible PTCA and stenting, its risks and benefits, i.e., death, MI, and stroke, need for emergency CABG, local vascular complications, risk of restenosis, etc.  He consented for PCI.  PROCEDURE IN DETAIL:  After obtaining the informed consent, the patient was brought to the cath lab and was placed on fluoroscopy table.  Right groin was prepped and draped in usual fashion.  Xylocaine 1% was used for local anesthesia in the right groin.  With the help of thin-wall needle,  5-French arterial sheath was placed.  The sheath was aspirated and flushed.  Next, 5-French left Judkins catheter was advanced over the wire under fluoroscopic guidance up to the ascending aorta.  Wire was pulled out, the catheter was aspirated and connected to the manifold. Catheter was further advanced and engaged into the left coronary  ostium. Multiple views of the left system were taken.  Next, catheter was disengaged and was pulled out over the wire and was replaced with 5- [place] right Judkins catheter, which was advanced over the wire under fluoroscopic guidance up to the ascending aorta.  Wire was pulled out. The catheter was aspirated and connected to the manifold.  Catheter was further advanced and engaged into right coronary ostium.  Multiple views of the right system were taken.  Next, the catheter was disengaged and was pulled out over the wire and was replaced with 5-French pigtail catheter, which was advanced over the wire under fluoroscopic guidance up to the ascending aorta.  Wire was pulled out, the catheter was aspirated and connected to the manifold.  Catheter was further advanced across the aortic valve into the LV.  LV pressures were recorded.  Next LV graft was done in 30-degree RAO position.  Post angiographic pressures were recorded from LV and then pullback pressures were recorded from aorta.  There was no gradient across the aortic valve. Next, the pigtail catheter was pulled out over the wire.  Sheaths were aspirated and flushed.  FINDINGS:  LV showed inferior mid and basal wall hypokinesia, EF of 50- 55%.  Left main was patent.  The LAD has 30-40% proximal and mid stenosis, which appears better than prior angiogram.  Diagonal 1 has 20- 30% ostial stenosis, which also appears to be better than prior angiogram.  Left circumflex has 10-20% ostial and proximal stenosis. Stented segment is widely patent.  OM1 is very, very small.  OM2 is moderate size and has mild disease.  OM3 is small, which is patent.  RCA has 10-15% proximal and mid stenosis.  PDA has 30-40% mid stenosis.  PLV branches were small, which were patent.  The patient tolerated the procedure well.  There were no complications.  Arteriotomy was closed by ProGlide Perclose without any complications.  The patient tolerated  the procedure well and was transferred to recovery room in stable condition.     Eduardo Osier. Sharyn Lull, M.D.     MNH/MEDQ  D:  09/10/2010  T:  09/10/2010  Job:  [id number]  Electronically Signed by Rinaldo Cloud M.D. on 09/22/2010 08:56:32 PM

## 2011-02-13 LAB — CBC
HCT: 34.1 — ABNORMAL LOW
HCT: 34.5 — ABNORMAL LOW
HCT: 40.5
Hemoglobin: 11.6 — ABNORMAL LOW
Hemoglobin: 14.2
MCHC: 33.8
MCV: 96.8
MCV: 97.1
Platelets: 196
Platelets: 204
Platelets: 255
RBC: 3.47 — ABNORMAL LOW
RBC: 3.56 — ABNORMAL LOW
RBC: 4.27
RDW: 13
WBC: 10.1
WBC: 11.2 — ABNORMAL HIGH
WBC: 14.2 — ABNORMAL HIGH
WBC: 9.7

## 2011-02-13 LAB — COMPREHENSIVE METABOLIC PANEL
ALT: 35
Albumin: 3.7
Alkaline Phosphatase: 79
BUN: 11
GFR calc Af Amer: >60
Potassium: 4.3
Sodium: 135
Total Protein: 6.7

## 2011-02-13 LAB — LIPID PANEL
Total CHOL/HDL Ratio: 3
VLDL: 18

## 2011-02-13 LAB — BASIC METABOLIC PANEL
BUN: 11
BUN: 7
BUN: 8
CO2: 21
CO2: 23
CO2: 26
Chloride: 104
Chloride: 106
Chloride: 109
Glucose, Bld: 108 — ABNORMAL HIGH
Glucose, Bld: 116 — ABNORMAL HIGH
Potassium: 4
Potassium: 4
Potassium: 4.2
Sodium: 140

## 2011-02-13 LAB — CK TOTAL AND CKMB (NOT AT ARMC)
CK, MB: 16.3 — ABNORMAL HIGH
Relative Index: 5.9 — ABNORMAL HIGH

## 2011-02-13 LAB — DIFFERENTIAL
Basophils Relative: 0
Basophils Relative: 1
Eosinophils Absolute: 0.2
Eosinophils Relative: 1
Lymphs Abs: 3.1
Monocytes Absolute: 0.4
Monocytes Relative: 4
Neutro Abs: 9.3 — ABNORMAL HIGH
Neutrophils Relative %: 70

## 2011-02-13 LAB — I-STAT 8, (EC8 V) (CONVERTED LAB)
Acid-base deficit: 2
Chloride: 107
Glucose, Bld: 155 — ABNORMAL HIGH
Hemoglobin: 14.6
Potassium: 3.6
Sodium: 139
TCO2: 21

## 2011-02-13 LAB — CARDIAC PANEL(CRET KIN+CKTOT+MB+TROPI)
CK, MB: 2
CK, MB: 294.9 — ABNORMAL HIGH
Relative Index: 14.2 — ABNORMAL HIGH
Relative Index: 18.8 — ABNORMAL HIGH
Relative Index: 4.2 — ABNORMAL HIGH
Total CK: 197
Troponin I: 12.03
Troponin I: 37.54

## 2011-02-13 LAB — HEPATIC FUNCTION PANEL
Bilirubin, Direct: 0.2
Indirect Bilirubin: 0.7
Total Protein: 6.4

## 2011-02-13 LAB — POCT CARDIAC MARKERS
Myoglobin, poc: 119
Operator id: 277751
Operator id: 277751

## 2011-02-13 LAB — APTT: aPTT: 31

## 2011-02-13 LAB — TSH: TSH: 0.154 — ABNORMAL LOW

## 2011-02-13 LAB — POCT I-STAT CREATININE: Operator id: 277751

## 2011-02-13 LAB — TROPONIN I: Troponin I: 10.89

## 2011-02-13 LAB — PLATELET COUNT: Platelets: 205

## 2011-04-10 ENCOUNTER — Other Ambulatory Visit: Payer: Self-pay | Admitting: Cardiology

## 2012-08-07 ENCOUNTER — Encounter (HOSPITAL_COMMUNITY): Payer: Self-pay | Admitting: *Deleted

## 2012-08-07 ENCOUNTER — Emergency Department (HOSPITAL_COMMUNITY)
Admission: EM | Admit: 2012-08-07 | Discharge: 2012-08-07 | Disposition: A | Discharge status: Home / Self Care | Payer: BC Managed Care – PPO | Attending: Emergency Medicine | Admitting: Emergency Medicine

## 2012-08-07 DIAGNOSIS — J029 Acute pharyngitis, unspecified: Secondary | ICD-10-CM

## 2012-08-07 DIAGNOSIS — Z87891 Personal history of nicotine dependence: Secondary | ICD-10-CM | POA: Insufficient documentation

## 2012-08-07 DIAGNOSIS — R05 Cough: Secondary | ICD-10-CM | POA: Insufficient documentation

## 2012-08-07 DIAGNOSIS — R059 Cough, unspecified: Secondary | ICD-10-CM | POA: Insufficient documentation

## 2012-08-07 DIAGNOSIS — R599 Enlarged lymph nodes, unspecified: Secondary | ICD-10-CM | POA: Insufficient documentation

## 2012-08-07 DIAGNOSIS — J3489 Other specified disorders of nose and nasal sinuses: Secondary | ICD-10-CM | POA: Insufficient documentation

## 2012-08-07 DIAGNOSIS — J019 Acute sinusitis, unspecified: Secondary | ICD-10-CM | POA: Insufficient documentation

## 2012-08-07 DIAGNOSIS — E119 Type 2 diabetes mellitus without complications: Secondary | ICD-10-CM | POA: Insufficient documentation

## 2012-08-07 HISTORY — DX: Type 2 diabetes mellitus without complications: E11.9

## 2012-08-07 MED ORDER — HYDROCOD POLST-CHLORPHEN POLST 10-8 MG/5ML PO LQCR: 5.0000 mL | Freq: Two times a day (BID) | ORAL | Status: DC | PRN

## 2012-08-07 MED ORDER — FLUTICASONE PROPIONATE 50 MCG/ACT NA SUSP: 2.0000 | Freq: Every day | NASAL | Status: DC

## 2012-08-07 MED ORDER — AMOXICILLIN 500 MG PO CAPS: 500.0000 mg | ORAL_CAPSULE | Freq: Three times a day (TID) | ORAL | Status: DC

## 2012-08-07 NOTE — ED Provider Notes (Signed)
History     CSN: [id number]  Arrival date & time 08/07/12  0525   First MD Initiated Contact with Patient 08/07/12 [phone]      Chief Complaint  Patient presents with  . Sore Throat  . Sinus Problem    (Consider location/radiation/quality/duration/timing/severity/associated sxs/prior treatment) HPI Comments: Pt presents to the ED for sinus congestion and sore throat x3 days.  Associated sx include an intermittent, non-productive cough.  Denies any chest pain, SOB, dizziness, fever, difficulty swallowing, nausea, vomiting, or diarrhea.  No sick contacts.  Has tried OTC cold medication and saline nasal spray without significant relief.  The history is provided by the patient.    Past Medical History  Diagnosis Date  . Diabetes mellitus without complication     Past Surgical History  Procedure Laterality Date  . Stent in heart    . Cardiac surgery      Heart attack in 2008 with stent placement    History reviewed. No pertinent family history.  History  Substance Use Topics  . Smoking status: Former Games developer  . Smokeless tobacco: Not on file  . Alcohol Use: Yes     Comment: occassional       Review of Systems  HENT: Positive for sore throat and sinus pressure.   All other systems reviewed and are negative.    Allergies  Review of patient's allergies indicates no known allergies.  Home Medications  No current outpatient prescriptions on file.  BP 133/98  Pulse 98  Temp(Src) 97.5 F (36.4 C) (Oral)  SpO2 96%  Physical Exam  Nursing note and vitals reviewed. Constitutional: He is oriented to person, place, and time. He appears well-developed and well-nourished.  HENT:  Head: Normocephalic and atraumatic. No trismus in the jaw.  Right Ear: Tympanic membrane and ear canal normal.  Left Ear: Tympanic membrane and ear canal normal.  Nose: Right sinus exhibits maxillary sinus tenderness and frontal sinus tenderness. Left sinus exhibits maxillary sinus tenderness  and frontal sinus tenderness.  Mouth/Throat: Uvula is midline and mucous membranes are normal. No oral lesions. No edematous. Posterior oropharyngeal erythema present. No oropharyngeal exudate, posterior oropharyngeal edema or tonsillar abscesses.  Frontal and maxillary sinus tenderness, turbinates swollen and erythematous  Eyes: Conjunctivae and EOM are normal. Pupils are equal, round, and reactive to light.  Neck: Normal range of motion. Neck supple.  Cardiovascular: Normal rate, regular rhythm and normal heart sounds.   Pulmonary/Chest: Effort normal and breath sounds normal. He has no wheezes. He has no rhonchi.  Abdominal: Soft. Bowel sounds are normal. There is no tenderness. There is no guarding.  Musculoskeletal: Normal range of motion. He exhibits no edema.  Lymphadenopathy:    He has cervical adenopathy (anterior).  Neurological: He is alert and oriented to person, place, and time.  Skin: Skin is warm and dry.  Psychiatric: He has a normal mood and affect.    ED Course  Procedures (including critical care time)  Labs Reviewed - No data to display No results found.   1. Acute sinusitis   2. Pharyngitis       MDM    Pt presenting to ED for sore throat and sinus congestion x3 days.  No SOB or wheezes, lungs CTAB- low suspicion for bronchitis or pneumonia.  Deferred rapid strep- will tx with abx regardless of result.  Rx amoxicillin, tussionex, and flonase.  FU with PCP if sx not improving.  Discussed plan with pt- he agreed.  Return precautions advised.  Garlon Hatchet, PA-C 08/07/12 [phone]

## 2012-08-07 NOTE — ED Provider Notes (Signed)
Medical screening examination/treatment/procedure(s) were performed by non-physician practitioner and as supervising physician I was immediately available for consultation/collaboration.  Olivia Mackie, MD 08/07/12 [phone]

## 2012-08-07 NOTE — ED Notes (Signed)
Pt stated that about 3-4 days ago he's been experiencing sore throat, head congestion, and problems with his sinus.

## 2012-08-07 NOTE — Discharge Instructions (Signed)
Take prescribed medications as directed. Follow up with your primary care physician if symptoms worsen or you feel you are not improving. Return to the ED for new concerns.

## 2012-08-21 ENCOUNTER — Emergency Department (HOSPITAL_COMMUNITY)
Admission: EM | Admit: 2012-08-21 | Discharge: 2012-08-21 | Disposition: A | Discharge status: Home / Self Care | Payer: BC Managed Care – PPO | Attending: Emergency Medicine | Admitting: Emergency Medicine

## 2012-08-21 ENCOUNTER — Encounter (HOSPITAL_COMMUNITY): Payer: Self-pay | Admitting: Family Medicine

## 2012-08-21 DIAGNOSIS — I10 Essential (primary) hypertension: Secondary | ICD-10-CM | POA: Insufficient documentation

## 2012-08-21 DIAGNOSIS — Z79899 Other long term (current) drug therapy: Secondary | ICD-10-CM | POA: Insufficient documentation

## 2012-08-21 DIAGNOSIS — R21 Rash and other nonspecific skin eruption: Secondary | ICD-10-CM

## 2012-08-21 DIAGNOSIS — E119 Type 2 diabetes mellitus without complications: Secondary | ICD-10-CM | POA: Insufficient documentation

## 2012-08-21 DIAGNOSIS — T783XXA Angioneurotic edema, initial encounter: Secondary | ICD-10-CM

## 2012-08-21 DIAGNOSIS — Z7982 Long term (current) use of aspirin: Secondary | ICD-10-CM | POA: Insufficient documentation

## 2012-08-21 DIAGNOSIS — Z87891 Personal history of nicotine dependence: Secondary | ICD-10-CM | POA: Insufficient documentation

## 2012-08-21 MED ORDER — DEXAMETHASONE SODIUM PHOSPHATE 10 MG/ML IJ SOLN
10.0000 mg | Freq: Once | INTRAMUSCULAR | Status: AC
  Administered 2012-08-21: 10 mg via INTRAMUSCULAR
  Filled 2012-08-21: qty 1

## 2012-08-21 MED ORDER — FAMOTIDINE IN NACL 20-0.9 MG/50ML-% IV SOLN
20.0000 mg | Freq: Once | INTRAVENOUS | Status: AC
  Administered 2012-08-21: 20 mg via INTRAVENOUS
  Filled 2012-08-21: qty 50

## 2012-08-21 MED ORDER — DIPHENHYDRAMINE HCL 25 MG PO CAPS: 25.0000 mg | ORAL_CAPSULE | Freq: Four times a day (QID) | ORAL | Status: DC | PRN

## 2012-08-21 MED ORDER — METHYLPREDNISOLONE SODIUM SUCC 125 MG IJ SOLR
125.0000 mg | Freq: Once | INTRAMUSCULAR | Status: AC
  Administered 2012-08-21: 125 mg via INTRAVENOUS
  Filled 2012-08-21: qty 2

## 2012-08-21 MED ORDER — DIPHENHYDRAMINE HCL 12.5 MG/5ML PO ELIX
12.5000 mg | ORAL_SOLUTION | Freq: Once | ORAL | Status: AC
  Administered 2012-08-21: 12.5 mg via ORAL
  Filled 2012-08-21: qty 10

## 2012-08-21 NOTE — ED Notes (Signed)
Per pt sts rash that started last night that covers his arms, back neck and face. Pt also has swelling to bottom lip. sts took some benadryl last night without relief.

## 2012-08-21 NOTE — ED Provider Notes (Signed)
History     CSN: [id number]  Arrival date & time 08/21/12  1191   First MD Initiated Contact with Patient 08/21/12 1004      Chief Complaint  Patient presents with  . Allergic Reaction    HPI Patient presents with rash which began last night and then started having lower lip swelling this morning.  Patient does not recall any new food or medication and is taken.  He denies any difficulty breathing or swallowing.  Patient's never had this happen before.  Patient currently takes a beta blocker but is not on an ACE inhibitor. Past Medical History  Diagnosis Date  . Diabetes mellitus without complication     Past Surgical History  Procedure Laterality Date  . Stent in heart    . Cardiac surgery      Heart attack in 2008 with stent placement    History reviewed. No pertinent family history.  History  Substance Use Topics  . Smoking status: Former Games developer  . Smokeless tobacco: Not on file  . Alcohol Use: Yes     Comment: occassional       Review of Systems All other systems reviewed and are negative Allergies  Review of patient's allergies indicates no known allergies.  Home Medications   Current Outpatient Rx  Name  Route  Sig  Dispense  Refill  . aspirin EC 81 MG tablet   Oral   Take 162 mg by mouth daily.         Marland Kitchen atorvastatin (LIPITOR) 20 MG tablet   Oral   Take 20 mg by mouth daily.         Marland Kitchen glimepiride (AMARYL) 4 MG tablet   Oral   Take 4 mg by mouth daily.         Marland Kitchen levothyroxine (SYNTHROID, LEVOTHROID) 125 MCG tablet   Oral   Take 125 mcg by mouth daily before breakfast.         . linagliptin (TRADJENTA) 5 MG TABS tablet   Oral   Take 5 mg by mouth daily.         . metFORMIN (GLUCOPHAGE) 1000 MG tablet   Oral   Take 1,000 mg by mouth 2 (two) times daily with a meal.         . metoprolol succinate (TOPROL-XL) 50 MG 24 hr tablet   Oral   Take 50 mg by mouth daily. Take with or immediately following a meal.         . ranitidine  (ZANTAC) 150 MG tablet   Oral   Take 450 mg by mouth daily.         . diphenhydrAMINE (BENADRYL) 25 mg capsule   Oral   Take 1 capsule (25 mg total) by mouth every 6 (six) hours as needed (Take for 2 days).   30 capsule   0     BP 128/93  Pulse 74  Temp(Src) 98.9 F (37.2 C) (Oral)  Resp 20  SpO2 96%  Physical Exam  Nursing note and vitals reviewed. Constitutional: He is oriented to person, place, and time. He appears well-developed and well-nourished. No distress.  HENT:  Head: Normocephalic and atraumatic.  Patient has swelling to the lower lip.  No evidence of tongue, uvula or airway difficulty.  Eyes: Pupils are equal, round, and reactive to light.  Neck: Normal range of motion.  Cardiovascular: Normal rate and intact distal pulses.   Pulmonary/Chest: No respiratory distress.  Abdominal: Normal appearance. He exhibits no distension.  Musculoskeletal: Normal range of motion.  Neurological: He is alert and oriented to person, place, and time. No cranial nerve deficit.  Skin: Skin is warm and dry. Rash noted.  Psychiatric: He has a normal mood and affect. His behavior is normal.    ED Course  Procedures (including critical care time) Meds ordered this encounter  Medications  . diphenhydrAMINE (BENADRYL) 12.5 MG/5ML elixir 12.5 mg    Sig:   . famotidine (PEPCID) IVPB 20 mg    Sig:   . methylPREDNISolone sodium succinate (SOLU-MEDROL) 125 mg/2 mL injection 125 mg    Sig:     Labs Reviewed - No data to display No results found.   1. Angioedema, initial encounter   2. Rash       MDM  After treatment in the ED the patient feels back to baseline and wants to go home.  Patient has had improvement in his symptoms and appears stable to go home.       Nelia Shi, MD 08/21/12 [phone]

## 2012-08-23 ENCOUNTER — Emergency Department (HOSPITAL_COMMUNITY)
Admission: EM | Admit: 2012-08-23 | Discharge: 2012-08-23 | Disposition: A | Discharge status: Home / Self Care | Payer: BC Managed Care – PPO | Attending: Emergency Medicine | Admitting: Emergency Medicine

## 2012-08-23 ENCOUNTER — Encounter (HOSPITAL_COMMUNITY): Payer: Self-pay | Admitting: Emergency Medicine

## 2012-08-23 DIAGNOSIS — Y929 Unspecified place or not applicable: Secondary | ICD-10-CM | POA: Insufficient documentation

## 2012-08-23 DIAGNOSIS — Y939 Activity, unspecified: Secondary | ICD-10-CM | POA: Insufficient documentation

## 2012-08-23 DIAGNOSIS — Z9861 Coronary angioplasty status: Secondary | ICD-10-CM | POA: Insufficient documentation

## 2012-08-23 DIAGNOSIS — R220 Localized swelling, mass and lump, head: Secondary | ICD-10-CM | POA: Insufficient documentation

## 2012-08-23 DIAGNOSIS — Z79899 Other long term (current) drug therapy: Secondary | ICD-10-CM | POA: Insufficient documentation

## 2012-08-23 DIAGNOSIS — T783XXA Angioneurotic edema, initial encounter: Secondary | ICD-10-CM | POA: Insufficient documentation

## 2012-08-23 DIAGNOSIS — Z87891 Personal history of nicotine dependence: Secondary | ICD-10-CM | POA: Insufficient documentation

## 2012-08-23 DIAGNOSIS — T783XXD Angioneurotic edema, subsequent encounter: Secondary | ICD-10-CM

## 2012-08-23 DIAGNOSIS — Z7982 Long term (current) use of aspirin: Secondary | ICD-10-CM | POA: Insufficient documentation

## 2012-08-23 DIAGNOSIS — T4995XA Adverse effect of unspecified topical agent, initial encounter: Secondary | ICD-10-CM | POA: Insufficient documentation

## 2012-08-23 DIAGNOSIS — T7840XD Allergy, unspecified, subsequent encounter: Secondary | ICD-10-CM

## 2012-08-23 DIAGNOSIS — E119 Type 2 diabetes mellitus without complications: Secondary | ICD-10-CM | POA: Insufficient documentation

## 2012-08-23 DIAGNOSIS — R221 Localized swelling, mass and lump, neck: Secondary | ICD-10-CM | POA: Insufficient documentation

## 2012-08-23 DIAGNOSIS — T65891A Toxic effect of other specified substances, accidental (unintentional), initial encounter: Secondary | ICD-10-CM | POA: Insufficient documentation

## 2012-08-23 MED ORDER — DIPHENHYDRAMINE HCL 25 MG PO TABS: 25.0000 mg | ORAL_TABLET | Freq: Four times a day (QID) | ORAL | Status: DC

## 2012-08-23 MED ORDER — FAMOTIDINE 20 MG PO TABS
20.0000 mg | ORAL_TABLET | Freq: Once | ORAL | Status: AC
  Administered 2012-08-23: 20 mg via ORAL
  Filled 2012-08-23: qty 1

## 2012-08-23 MED ORDER — METHYLPREDNISOLONE SODIUM SUCC 125 MG IJ SOLR
125.0000 mg | Freq: Once | INTRAMUSCULAR | Status: AC
Start: 2012-08-23 — End: 2012-08-23
  Administered 2012-08-23: 125 mg via INTRAMUSCULAR
  Filled 2012-08-23: qty 2

## 2012-08-23 MED ORDER — PREDNISONE 20 MG PO TABS: 40.0000 mg | ORAL_TABLET | Freq: Every day | ORAL | Status: DC

## 2012-08-23 MED ORDER — DIPHENHYDRAMINE HCL 25 MG PO CAPS
50.0000 mg | ORAL_CAPSULE | Freq: Once | ORAL | Status: AC
  Administered 2012-08-23: 50 mg via ORAL
  Filled 2012-08-23: qty 2

## 2012-08-23 MED ORDER — DEXAMETHASONE SODIUM PHOSPHATE 10 MG/ML IJ SOLN
10.0000 mg | Freq: Once | INTRAMUSCULAR | Status: AC
  Administered 2012-08-23: 10 mg via INTRAMUSCULAR
  Filled 2012-08-23: qty 1

## 2012-08-23 MED ORDER — FAMOTIDINE 20 MG PO TABS: 20.0000 mg | ORAL_TABLET | Freq: Two times a day (BID) | ORAL | Status: DC

## 2012-08-23 NOTE — ED Provider Notes (Signed)
History    This chart was scribed for non-physician practitioner working with Gilda Crease, * by Donne Anon, ED Scribe. This patient was seen in room TR08C/TR08C and the patient's care was started at 1709.   CSN: [id number]  Arrival date & time 08/23/12  1551   First MD Initiated Contact with Patient 08/23/12 1709      Chief Complaint  Patient presents with  . Allergic Reaction  . Rash     The history is provided by the patient. No language interpreter was used.   Zachary Knox is a 62 y.o. male who presents to the Emergency Department complaining of gradual onset, gradually worsening severe lip swelling which began last night. He was seen in the ED 3 days ago for the same symptoms. He also has associated hives and swelling around his lips, neck, and arms at that time. He was treated and states he felt better upon discharge from the hospital. He reports he has been taking 1 pill of benadryl every 4 hours at home. He denies any new encounters. He denies difficulty breathing.  Past Medical History  Diagnosis Date  . Diabetes mellitus without complication     Past Surgical History  Procedure Laterality Date  . Stent in heart    . Cardiac surgery      Heart attack in 2008 with stent placement    No family history on file.  History  Substance Use Topics  . Smoking status: Former Games developer  . Smokeless tobacco: Not on file  . Alcohol Use: Yes     Comment: occassional       Review of Systems  Constitutional: Negative for fever and diaphoresis.  HENT: Positive for facial swelling. Negative for mouth sores, trouble swallowing, neck pain and neck stiffness.   Eyes: Negative for visual disturbance.  Respiratory: Negative for apnea, chest tightness and shortness of breath.   Cardiovascular: Negative for chest pain and palpitations.  Gastrointestinal: Negative for nausea, vomiting, diarrhea and constipation.  Genitourinary: Negative for dysuria.  Musculoskeletal:  Negative for gait problem.  Skin: Positive for rash.       pruitic wheals on face, neck, and bilateral arms  Neurological: Negative for dizziness, weakness, light-headedness, numbness and headaches.    Allergies  Review of patient's allergies indicates no known allergies.  Home Medications   Current Outpatient Rx  Name  Route  Sig  Dispense  Refill  . aspirin EC 81 MG tablet   Oral   Take 162 mg by mouth daily.         Marland Kitchen atorvastatin (LIPITOR) 20 MG tablet   Oral   Take 20 mg by mouth daily.         . diphenhydrAMINE (BENADRYL) 25 mg capsule   Oral   Take 25 mg by mouth every 6 (six) hours as needed for itching.         Marland Kitchen glimepiride (AMARYL) 4 MG tablet   Oral   Take 4 mg by mouth daily.         Marland Kitchen levothyroxine (SYNTHROID, LEVOTHROID) 125 MCG tablet   Oral   Take 125 mcg by mouth daily before breakfast.         . linagliptin (TRADJENTA) 5 MG TABS tablet   Oral   Take 5 mg by mouth daily.         . metFORMIN (GLUCOPHAGE) 1000 MG tablet   Oral   Take 1,000 mg by mouth 2 (two) times daily with a meal.         .  metoprolol succinate (TOPROL-XL) 50 MG 24 hr tablet   Oral   Take 50 mg by mouth daily. Take with or immediately following a meal.         . ranitidine (ZANTAC) 150 MG tablet   Oral   Take 150 mg by mouth daily as needed for heartburn.            BP 121/87  Pulse 78  Temp(Src) 98.7 F (37.1 C) (Oral)  Resp 16  Ht 5\' 9"  (1.753 m)  Wt 195 lb (88.451 kg)  BMI 28.78 kg/m2  SpO2 99%  Physical Exam  Nursing note and vitals reviewed. Constitutional: He is oriented to person, place, and time. He appears well-developed and well-nourished. No distress.  HENT:  Head: Normocephalic and atraumatic.  Eyes: Conjunctivae and EOM are normal.  Neck: Normal range of motion. Neck supple.  No meningeal signs  Cardiovascular: Normal rate, regular rhythm and normal heart sounds.  Exam reveals no gallop and no friction rub.   No murmur  heard. Pulmonary/Chest: Effort normal and breath sounds normal. No respiratory distress. He has no wheezes. He has no rales. He exhibits no tenderness.  Abdominal: Soft.  Musculoskeletal: Normal range of motion. He exhibits no edema and no tenderness.  Neurological: He is alert and oriented to person, place, and time. No cranial nerve deficit.  Skin: Skin is warm and dry. He is not diaphoretic. No erythema.  Pruritic wheals on bilateral arms, face and neck. Lips are swollen. Airway is patent. No sores on inside of mouth. No swelling of airway noted.   Psychiatric: He has a normal mood and affect.    ED Course  Procedures (including critical care time) DIAGNOSTIC STUDIES: Oxygen Saturation is 99% on room air, normal by my interpretation.    COORDINATION OF CARE: 5:16 PM Discussed treatment plan which includes Pepcid and Prednisone with pt at bedside and pt agreed to plan. Advised pt to follow up with PCP.  Meds ordered this encounter  Medications  . diphenhydrAMINE (BENADRYL) 25 mg capsule    Sig: Take 25 mg by mouth every 6 (six) hours as needed for itching.  . diphenhydrAMINE (BENADRYL) capsule 50 mg    Sig:   . dexamethasone (DECADRON) injection 10 mg    Sig:   . famotidine (PEPCID) tablet 20 mg    Sig:   . methylPREDNISolone sodium succinate (SOLU-MEDROL) 125 mg/2 mL injection 125 mg    Sig:    Medications  diphenhydrAMINE (BENADRYL) capsule 50 mg (50 mg Oral Given 08/23/12 1752)  dexamethasone (DECADRON) injection 10 mg (10 mg Intramuscular Given 08/23/12 1753)  famotidine (PEPCID) tablet 20 mg (20 mg Oral Given 08/23/12 1752)  methylPREDNISolone sodium succinate (SOLU-MEDROL) 125 mg/2 mL injection 125 mg (125 mg Intramuscular Given 08/23/12 1757)     Labs Reviewed - No data to display No results found. Discharge Medication List as of 08/23/2012  6:15 PM    START taking these medications   Details  diphenhydrAMINE (BENADRYL) 25 MG tablet Take 1 tablet (25 mg total) by  mouth every 6 (six) hours., Starting 08/23/2012, Until Discontinued, Print    famotidine (PEPCID) 20 MG tablet Take 1 tablet (20 mg total) by mouth 2 (two) times daily., Starting 08/23/2012, Until Discontinued, Print    predniSONE (DELTASONE) 20 MG tablet Take 2 tablets (40 mg total) by mouth daily., Starting 08/23/2012, Until Discontinued, Print         1. Angioedema, subsequent encounter   2. Allergic reaction, subsequent encounter  MDM  61 y.o. Male presents for returning symptoms of unknowns allergens s/p treatment for same in ED 4 days ago. Pt is not on ACE inhibitor for BP treatment. Pt states no difficulty breathing, no sensation of throat closing, but was concerned for facial/lip swelling that returned. Pt felt relief and looked better after course of benadryl, pepcid, decadron, and solumderol. Swelling noted to have subsided substantially. Airway patent.   Patient re-evaluated prior to dc, is hemodynamically stable, in no respiratory distress, and denies the feeling of throat closing. Prescribed benadryl, pepcid, decadron, and solumderol for home and gave strict return precautions in the event he feels  throat closing, difficulty breathing, swelling of lips face or tongue). Pt is to follow up with their PCP and stressed importance of determining source of this allergen. Pt and family at bedside is agreeable with plan & verbalizes understanding.        Glade Nurse, PA-C 08/24/12 [phone]

## 2012-08-23 NOTE — ED Notes (Signed)
Onset 4 days ago light pink rash right elbow, left arm, and swelling of face. Seen in ED 3 days ago and was given prednisone.  States also been taking benadryl.  Felt better Saturday however last night rash returning with swelling.  Airway intact bilateral equal chest rise and fall.

## 2012-08-27 NOTE — ED Provider Notes (Signed)
Medical screening examination/treatment/procedure(s) were performed by non-physician practitioner and as supervising physician I was immediately available for consultation/collaboration.    Christopher J. Pollina, MD 08/27/12 1545 

## 2015-05-08 ENCOUNTER — Other Ambulatory Visit: Payer: Self-pay | Admitting: Cardiology

## 2015-05-08 DIAGNOSIS — R079 Chest pain, unspecified: Secondary | ICD-10-CM

## 2015-05-14 ENCOUNTER — Other Ambulatory Visit: Payer: Self-pay | Admitting: Cardiology

## 2015-05-14 ENCOUNTER — Encounter (HOSPITAL_COMMUNITY)
Admission: RE | Admit: 2015-05-14 | Discharge: 2015-05-14 | Disposition: A | Discharge status: Home / Self Care | Payer: Medicare Other | Source: Ambulatory Visit | Attending: Cardiology | Admitting: Cardiology

## 2015-05-14 DIAGNOSIS — R079 Chest pain, unspecified: Secondary | ICD-10-CM | POA: Diagnosis not present

## 2015-05-14 MED ORDER — TECHNETIUM TC 99M SESTAMIBI - CARDIOLITE
30.0000 | Freq: Once | INTRAVENOUS | Status: AC | PRN
  Administered 2015-05-14: 30 via INTRAVENOUS

## 2015-05-14 MED ORDER — REGADENOSON 0.4 MG/5ML IV SOLN
0.4000 mg | Freq: Once | INTRAVENOUS | Status: AC
  Administered 2015-05-14: 0.4 mg via INTRAVENOUS

## 2015-05-14 MED ORDER — TECHNETIUM TC 99M SESTAMIBI GENERIC - CARDIOLITE
10.0000 | Freq: Once | INTRAVENOUS | Status: AC | PRN
  Administered 2015-05-14: 10 via INTRAVENOUS

## 2015-05-14 MED ORDER — REGADENOSON 0.4 MG/5ML IV SOLN
INTRAVENOUS | Status: AC
  Filled 2015-05-14: qty 5

## 2015-05-15 LAB — NM MYOCAR MULTI W/SPECT W/WALL MOTION / EF
CHL CUP STRESS STAGE 1 GRADE: 0 %
CHL CUP STRESS STAGE 2 GRADE: 0 %
CHL CUP STRESS STAGE 2 HR: 57 {beats}/min
CHL CUP STRESS STAGE 3 HR: 76 {beats}/min
CHL CUP STRESS STAGE 4 SBP: 133 mmHg
CHL CUP STRESS STAGE 4 SPEED: 0 mph
CSEPPBP: 133 mmHg
CSEPPHR: 71 {beats}/min
CSEPPMHR: 45 %
Estimated workload: 1 METS
Stage 1 DBP: 85 mmHg
Stage 1 HR: 57 {beats}/min
Stage 1 SBP: 124 mmHg
Stage 1 Speed: 0 mph
Stage 2 Speed: 0 mph
Stage 3 DBP: 93 mmHg
Stage 3 Grade: 0 %
Stage 3 SBP: 149 mmHg
Stage 3 Speed: 0 mph
Stage 4 DBP: 92 mmHg
Stage 4 Grade: 0 %
Stage 4 HR: 71 {beats}/min

## 2016-02-15 ENCOUNTER — Ambulatory Visit (HOSPITAL_COMMUNITY)
Admission: RE | Admit: 2016-02-15 | Discharge: 2016-02-15 | Disposition: A | Discharge status: Home / Self Care | Payer: Medicare Other | Source: Ambulatory Visit | Attending: Pulmonary Disease | Admitting: Pulmonary Disease

## 2016-02-15 ENCOUNTER — Other Ambulatory Visit (HOSPITAL_COMMUNITY): Payer: Self-pay | Admitting: Pulmonary Disease

## 2016-02-15 DIAGNOSIS — R05 Cough: Secondary | ICD-10-CM

## 2016-02-15 DIAGNOSIS — I700 Atherosclerosis of aorta: Secondary | ICD-10-CM | POA: Insufficient documentation

## 2016-02-15 DIAGNOSIS — Z8546 Personal history of malignant neoplasm of prostate: Secondary | ICD-10-CM | POA: Insufficient documentation

## 2016-02-15 DIAGNOSIS — R918 Other nonspecific abnormal finding of lung field: Secondary | ICD-10-CM | POA: Insufficient documentation

## 2016-02-15 DIAGNOSIS — R053 Chronic cough: Secondary | ICD-10-CM

## 2017-05-22 ENCOUNTER — Other Ambulatory Visit: Payer: Self-pay | Admitting: Urology

## 2017-05-22 DIAGNOSIS — C61 Malignant neoplasm of prostate: Secondary | ICD-10-CM

## 2017-06-08 ENCOUNTER — Encounter (HOSPITAL_COMMUNITY)
Admission: RE | Admit: 2017-06-08 | Discharge: 2017-06-08 | Disposition: A | Discharge status: Home / Self Care | Payer: Medicare Other | Source: Ambulatory Visit | Attending: Urology | Admitting: Urology

## 2017-06-08 DIAGNOSIS — C61 Malignant neoplasm of prostate: Secondary | ICD-10-CM

## 2017-06-08 MED ORDER — TECHNETIUM TC 99M MEDRONATE IV KIT
25.0000 | PACK | Freq: Once | INTRAVENOUS | Status: AC | PRN
  Administered 2017-06-08: 21.4 via INTRAVENOUS

## 2017-08-31 ENCOUNTER — Other Ambulatory Visit: Payer: Self-pay | Admitting: Cardiology

## 2017-08-31 DIAGNOSIS — R079 Chest pain, unspecified: Secondary | ICD-10-CM

## 2017-09-07 ENCOUNTER — Encounter (HOSPITAL_COMMUNITY)
Admission: RE | Admit: 2017-09-07 | Discharge: 2017-09-07 | Disposition: A | Discharge status: Home / Self Care | Payer: Medicare Other | Source: Ambulatory Visit | Attending: Cardiology | Admitting: Cardiology

## 2017-09-07 DIAGNOSIS — R079 Chest pain, unspecified: Secondary | ICD-10-CM | POA: Insufficient documentation

## 2017-09-07 MED ORDER — TECHNETIUM TC 99M TETROFOSMIN IV KIT
10.0000 | PACK | Freq: Once | INTRAVENOUS | Status: AC | PRN
  Administered 2017-09-07: 10 via INTRAVENOUS

## 2017-09-07 MED ORDER — REGADENOSON 0.4 MG/5ML IV SOLN
INTRAVENOUS | Status: AC
  Administered 2017-09-07: 0.4 mg via INTRAVENOUS
  Filled 2017-09-07: qty 5

## 2017-09-07 MED ORDER — REGADENOSON 0.4 MG/5ML IV SOLN: 0.4000 mg | Freq: Once | INTRAVENOUS | Status: AC

## 2017-09-07 MED ORDER — TECHNETIUM TC 99M TETROFOSMIN IV KIT
30.0000 | PACK | Freq: Once | INTRAVENOUS | Status: AC | PRN
  Administered 2017-09-07: 30 via INTRAVENOUS

## 2018-02-12 ENCOUNTER — Ambulatory Visit (HOSPITAL_COMMUNITY)
Admission: RE | Admit: 2018-02-12 | Discharge: 2018-02-12 | Disposition: A | Discharge status: Home / Self Care | Payer: Medicare Other | Source: Ambulatory Visit | Attending: Pulmonary Disease | Admitting: Pulmonary Disease

## 2018-02-12 ENCOUNTER — Other Ambulatory Visit (HOSPITAL_COMMUNITY): Payer: Self-pay | Admitting: Pulmonary Disease

## 2018-02-12 DIAGNOSIS — M79604 Pain in right leg: Secondary | ICD-10-CM | POA: Insufficient documentation

## 2018-02-12 DIAGNOSIS — M545 Low back pain, unspecified: Secondary | ICD-10-CM

## 2018-02-12 DIAGNOSIS — Q7649 Other congenital malformations of spine, not associated with scoliosis: Secondary | ICD-10-CM | POA: Insufficient documentation

## 2018-02-12 DIAGNOSIS — M79605 Pain in left leg: Secondary | ICD-10-CM | POA: Insufficient documentation

## 2018-02-12 DIAGNOSIS — M5136 Other intervertebral disc degeneration, lumbar region: Secondary | ICD-10-CM | POA: Insufficient documentation

## 2018-05-05 DIAGNOSIS — C801 Malignant (primary) neoplasm, unspecified: Secondary | ICD-10-CM

## 2018-05-05 HISTORY — DX: Malignant (primary) neoplasm, unspecified: C80.1

## 2018-07-05 ENCOUNTER — Emergency Department (HOSPITAL_COMMUNITY): Payer: Medicare Other

## 2018-07-05 ENCOUNTER — Other Ambulatory Visit: Payer: Self-pay

## 2018-07-05 ENCOUNTER — Emergency Department (HOSPITAL_COMMUNITY)
Admission: EM | Admit: 2018-07-05 | Discharge: 2018-07-05 | Disposition: A | Discharge status: Home / Self Care | Payer: Medicare Other | Attending: Emergency Medicine | Admitting: Emergency Medicine

## 2018-07-05 DIAGNOSIS — Z7984 Long term (current) use of oral hypoglycemic drugs: Secondary | ICD-10-CM | POA: Diagnosis not present

## 2018-07-05 DIAGNOSIS — R1011 Right upper quadrant pain: Secondary | ICD-10-CM | POA: Diagnosis present

## 2018-07-05 DIAGNOSIS — E119 Type 2 diabetes mellitus without complications: Secondary | ICD-10-CM | POA: Insufficient documentation

## 2018-07-05 DIAGNOSIS — Z79899 Other long term (current) drug therapy: Secondary | ICD-10-CM | POA: Insufficient documentation

## 2018-07-05 DIAGNOSIS — Z87891 Personal history of nicotine dependence: Secondary | ICD-10-CM | POA: Diagnosis not present

## 2018-07-05 DIAGNOSIS — R079 Chest pain, unspecified: Secondary | ICD-10-CM

## 2018-07-05 DIAGNOSIS — R918 Other nonspecific abnormal finding of lung field: Secondary | ICD-10-CM | POA: Diagnosis not present

## 2018-07-05 DIAGNOSIS — Z7982 Long term (current) use of aspirin: Secondary | ICD-10-CM | POA: Diagnosis not present

## 2018-07-05 DIAGNOSIS — R0789 Other chest pain: Secondary | ICD-10-CM | POA: Insufficient documentation

## 2018-07-05 DIAGNOSIS — R109 Unspecified abdominal pain: Secondary | ICD-10-CM

## 2018-07-05 LAB — CBC
HEMATOCRIT: 41.4 % (ref 39.0–52.0)
Hemoglobin: 14 g/dL (ref 13.0–17.0)
MCH: 31.9 pg (ref 26.0–34.0)
MCHC: 33.8 g/dL (ref 30.0–36.0)
MCV: 94.3 fL (ref 80.0–100.0)
NRBC: 0 % (ref 0.0–0.2)
Platelets: 218 10*3/uL (ref 150–400)
RBC: 4.39 MIL/uL (ref 4.22–5.81)
RDW: 13.2 % (ref 11.5–15.5)
WBC: 6.7 10*3/uL (ref 4.0–10.5)

## 2018-07-05 LAB — COMPREHENSIVE METABOLIC PANEL
ALT: 22 U/L (ref 0–44)
AST: 25 U/L (ref 15–41)
Albumin: 3.6 g/dL (ref 3.5–5.0)
Alkaline Phosphatase: 60 U/L (ref 38–126)
Anion gap: 11 (ref 5–15)
BILIRUBIN TOTAL: 1.1 mg/dL (ref 0.3–1.2)
BUN: 9 mg/dL (ref 8–23)
CHLORIDE: 105 mmol/L (ref 98–111)
CO2: 21 mmol/L — AB (ref 22–32)
Calcium: 8.8 mg/dL — ABNORMAL LOW (ref 8.9–10.3)
Creatinine, Ser: 0.92 mg/dL (ref 0.61–1.24)
GLUCOSE: 136 mg/dL — AB (ref 70–99)
POTASSIUM: 4 mmol/L (ref 3.5–5.1)
Sodium: 137 mmol/L (ref 135–145)
Total Protein: 6.7 g/dL (ref 6.5–8.1)

## 2018-07-05 LAB — URINALYSIS, ROUTINE W REFLEX MICROSCOPIC
Bilirubin Urine: NEGATIVE
Glucose, UA: NEGATIVE mg/dL
Hgb urine dipstick: NEGATIVE
KETONES UR: NEGATIVE mg/dL
LEUKOCYTE UA: NEGATIVE
NITRITE: NEGATIVE
Protein, ur: NEGATIVE mg/dL
Specific Gravity, Urine: 1.017 (ref 1.005–1.030)
pH: 7 (ref 5.0–8.0)

## 2018-07-05 LAB — LIPASE, BLOOD: Lipase: 29 U/L (ref 11–51)

## 2018-07-05 LAB — I-STAT TROPONIN, ED: TROPONIN I, POC: 0 ng/mL (ref 0.00–0.08)

## 2018-07-05 MED ORDER — IOHEXOL 300 MG/ML  SOLN
75.0000 mL | Freq: Once | INTRAMUSCULAR | Status: AC | PRN
  Administered 2018-07-05: 75 mL via INTRAVENOUS

## 2018-07-05 MED ORDER — TRAMADOL HCL 50 MG PO TABS: 50.0000 mg | ORAL_TABLET | Freq: Four times a day (QID) | ORAL | 0 refills | Status: DC | PRN

## 2018-07-05 MED ORDER — TRAMADOL HCL 50 MG PO TABS
50.0000 mg | ORAL_TABLET | Freq: Once | ORAL | Status: AC
  Administered 2018-07-05: 50 mg via ORAL
  Filled 2018-07-05: qty 1

## 2018-07-05 NOTE — ED Triage Notes (Signed)
Pt reports a right sided abdominal/side pain that started around 2 weeks ago. Pt also reports that he has sharp pains radiate up into the right side of his chest at times. Pt's wife reports the patient has been "coughing all the time." Pt reports two loose stools in the last 24 hours.

## 2018-07-05 NOTE — Discharge Instructions (Addendum)
It was our pleasure to provide your ER care today - we hope that you feel better.  As we discussed, your imaging studies show a right lung mass, that is suspicious for being a lung cancer. Please follow up at the [facility] in the coming week - see information/phone number above. Call that number this AM, and ask for the New Patient Scheduler/Coordinator.  Take ibuprofen or aleve as need for pain.  You may also take ultram as need for pain - no driving when taking. The ultram prescription was sent electronically to your pharmacy.   Return to ER if worse, new symptoms trouble breathing, other concern.

## 2018-07-05 NOTE — ED Notes (Signed)
Patient transported to X-ray 

## 2018-07-05 NOTE — ED Provider Notes (Signed)
[facility] EMERGENCY DEPARTMENT Provider Note   CSN: [id number] Arrival date & time: 07/05/18  3154    History   Chief Complaint Chief Complaint  Patient presents with  . Abdominal Pain    HPI Zachary Knox is a 68 y.o. male.     Patient c/o right sided upper abd pain and right chest pain for the past 2 weeks. Symptoms occur at rest, dull, moderate, non radiating. Symptoms episodic, recurrent, last minutest to hours. Pain is not pleuritic. No sob. No exertional cp or discomfort. Denies specific exacerbating or alleviating factors. Not related to eating. No back or flank pain. No hx gallstones. Spouse notes increased non prod cough. Denies fever or chills. Denies trauma or fall, injury or strain to area.   The history is provided by the patient.  Abdominal Pain  Associated symptoms: chest pain and cough   Associated symptoms: no diarrhea, no dysuria, no fever, no hematuria, no shortness of breath, no sore throat and no vomiting     Past Medical History:  Diagnosis Date  . Diabetes mellitus without complication     There are no active problems to display for this patient.   Past Surgical History:  Procedure Laterality Date  . CARDIAC SURGERY     Heart attack in 2008 with stent placement  . stent in heart          Home Medications    Prior to Admission medications   Medication Sig Start Date End Date Taking? Authorizing Provider  aspirin EC 81 MG tablet Take 162 mg by mouth daily.    [provider]  atorvastatin (LIPITOR) 20 MG tablet Take 20 mg by mouth daily.    [provider]  diphenhydrAMINE (BENADRYL) 25 mg capsule Take 25 mg by mouth every 6 (six) hours as needed for itching. 08/21/12   Leonard Schwartz, MD  diphenhydrAMINE (BENADRYL) 25 MG tablet Take 1 tablet (25 mg total) by mouth every 6 (six) hours. 08/23/12   Marny Lowenstein, PA-C  famotidine (PEPCID) 20 MG tablet Take 1 tablet (20 mg total) by mouth 2 (two) times  daily. 08/23/12   Coralee North A, PA-C  glimepiride (AMARYL) 4 MG tablet Take 4 mg by mouth daily.    [provider]  levothyroxine (SYNTHROID, LEVOTHROID) 125 MCG tablet Take 125 mcg by mouth daily before breakfast.    [provider]  linagliptin (TRADJENTA) 5 MG TABS tablet Take 5 mg by mouth daily.    [provider]  metFORMIN (GLUCOPHAGE) 1000 MG tablet Take 1,000 mg by mouth 2 (two) times daily with a meal.    [provider]  metoprolol succinate (TOPROL-XL) 50 MG 24 hr tablet Take 50 mg by mouth daily. Take with or immediately following a meal.    [provider]  predniSONE (DELTASONE) 20 MG tablet Take 2 tablets (40 mg total) by mouth daily. 08/23/12   Marny Lowenstein, PA-C  ranitidine (ZANTAC) 150 MG tablet Take 150 mg by mouth daily as needed for heartburn.     [provider]    Family History No family history on file.  Social History Social History   Tobacco Use  . Smoking status: Former Smoker  Substance Use Topics  . Alcohol use: Yes    Comment: occassional   . Drug use: Not on file     Allergies   Patient has no known allergies.   Review of Systems Review of Systems  Constitutional: Negative for fever.  HENT: Negative for sore throat.   Eyes: Negative for redness.  Respiratory: Positive for cough. Negative for shortness of breath.   Cardiovascular: Positive for chest pain.  Gastrointestinal: Positive for abdominal pain. Negative for diarrhea and vomiting.  Genitourinary: Negative for dysuria, flank pain and hematuria.  Musculoskeletal: Negative for back pain and neck pain.  Skin: Negative for rash.  Neurological: Negative for headaches.  Hematological: Does not bruise/bleed easily.  Psychiatric/Behavioral: Negative for confusion.     Physical Exam Updated Vital Signs Ht 1.753 m (5\' 9" )   Wt 82.6 kg   BMI 26.88 kg/m   Physical Exam Vitals signs and nursing note reviewed.  Constitutional:       Appearance: Normal appearance. He is well-developed.  HENT:     Head: Atraumatic.     Nose: Nose normal.     Mouth/Throat:     Mouth: Mucous membranes are moist.     Pharynx: Oropharynx is clear.  Eyes:     General: No scleral icterus.    Conjunctiva/sclera: Conjunctivae normal.     Pupils: Pupils are equal, round, and reactive to light.  Neck:     Musculoskeletal: Normal range of motion and neck supple. No neck rigidity.     Trachea: No tracheal deviation.  Cardiovascular:     Rate and Rhythm: Normal rate and regular rhythm.     Pulses: Normal pulses.     Heart sounds: Normal heart sounds. No murmur. No friction rub. No gallop.   Pulmonary:     Effort: Pulmonary effort is normal. No accessory muscle usage or respiratory distress.     Breath sounds: Normal breath sounds.  Chest:     Chest wall: No tenderness.  Abdominal:     General: Bowel sounds are normal. There is no distension.     Palpations: Abdomen is soft. There is no mass.     Tenderness: There is no abdominal tenderness. There is no guarding or rebound.     Hernia: No hernia is present.  Genitourinary:    Comments: No cva tenderness. Musculoskeletal:        General: No swelling.     Comments: T/L spine non tender, aligned.   Skin:    General: Skin is warm and dry.     Findings: No rash.     Comments: No shingles/rash to area of pain  Neurological:     Mental Status: He is alert.     Comments: Alert, speech clear. Motor/sens grossly intact. Steady gait.   Psychiatric:        Mood and Affect: Mood normal.      ED Treatments / Results  Labs (all labs ordered are listed, but only abnormal results are displayed) Results for orders placed or performed during the hospital encounter of 07/05/18  CBC  Result Value Ref Range   WBC 6.7 4.0 - 10.5 K/uL   RBC 4.39 4.22 - 5.81 MIL/uL   Hemoglobin 14.0 13.0 - 17.0 g/dL   HCT 41.4 39.0 - 52.0 %   MCV 94.3 80.0 - 100.0 fL   MCH 31.9 26.0 - 34.0 pg   MCHC 33.8 30.0  - 36.0 g/dL   RDW 13.2 11.5 - 15.5 %   Platelets 218 150 - 400 K/uL   nRBC 0.0 0.0 - 0.2 %  Comprehensive metabolic panel  Result Value Ref Range   Sodium 137 135 - 145 mmol/L   Potassium 4.0 3.5 - 5.1 mmol/L   Chloride 105 98 - 111 mmol/L  CO2 21 (L) 22 - 32 mmol/L   Glucose, Bld 136 (H) 70 - 99 mg/dL   BUN 9 8 - 23 mg/dL   Creatinine, Ser 0.92 0.61 - 1.24 mg/dL   Calcium 8.8 (L) 8.9 - 10.3 mg/dL   Total Protein 6.7 6.5 - 8.1 g/dL   Albumin 3.6 3.5 - 5.0 g/dL   AST 25 15 - 41 U/L   ALT 22 0 - 44 U/L   Alkaline Phosphatase 60 38 - 126 U/L   Total Bilirubin 1.1 0.3 - 1.2 mg/dL   GFR calc non Af Amer >60 >60 mL/min   GFR calc Af Amer >60 >60 mL/min   Anion gap 11 5 - 15  Lipase, blood  Result Value Ref Range   Lipase 29 11 - 51 U/L  Urinalysis, Routine w reflex microscopic  Result Value Ref Range   Color, Urine YELLOW YELLOW   APPearance CLEAR CLEAR   Specific Gravity, Urine 1.017 1.005 - 1.030   pH 7.0 5.0 - 8.0   Glucose, UA NEGATIVE NEGATIVE mg/dL   Hgb urine dipstick NEGATIVE NEGATIVE   Bilirubin Urine NEGATIVE NEGATIVE   Ketones, ur NEGATIVE NEGATIVE mg/dL   Protein, ur NEGATIVE NEGATIVE mg/dL   Nitrite NEGATIVE NEGATIVE   Leukocytes,Ua NEGATIVE NEGATIVE  I-stat troponin, ED  Result Value Ref Range   Troponin i, poc 0.00 0.00 - 0.08 ng/mL   Comment 3           Dg Chest 2 View  Result Date: 07/05/2018 CLINICAL DATA:  RIGHT-side abdominal pain for 2 weeks EXAM: CHEST - 2 VIEW COMPARISON:  02/15/2016 FINDINGS: Enlargement of cardiac silhouette. Mediastinal contours and pulmonary vascularity normal. Peribronchial thickening, chronic. On lateral view, abnormal density projects over the posterior midthoracic spine, poorly localized on AP view. This could represent subtle infiltrate or atelectasis but mass is not excluded. Remaining lungs clear. No pleural effusion or pneumothorax. No acute osseous findings. IMPRESSION: Enlargement of cardiac silhouette. Abnormal density  projecting over the midthoracic spine on lateral view question infiltrate versus atelectasis or mass; further evaluation by CT chest with contrast recommended. Electronically Signed   By: Lavonia Dana M.D.   On: 07/05/2018 08:13   Ct Chest W Contrast  Result Date: 07/05/2018 CLINICAL DATA:  68 year old male with right side chest pain and abnormal right lung density on radiographs today. EXAM: CT CHEST WITH CONTRAST TECHNIQUE: Multidetector CT imaging of the chest was performed during intravenous contrast administration. CONTRAST:  42mL OMNIPAQUE IOHEXOL 300 MG/ML  SOLN COMPARISON:  Chest radiographs 0804 hours today. CTA chest 12/16/2008. FINDINGS: Cardiovascular: No cardiomegaly or pericardial effusion. Calcified coronary artery atherosclerosis and/or stents (series 3, image 80). No significant atherosclerosis of the visible aorta. The left vertebral artery arises very near the arch (normal variant). Mediastinum/Nodes: There is new right hilar lymphadenopathy since the 2010 CT with nodes ranging from 10-17 millimeters short axis (series 3, images 64 and 69. Subcarinal nodal tissue has also increased up to 13 millimeters short axis. Anterior carina node measuring 9 millimeter short axis is larger but remains normal by size criteria. No left hilar lymphadenopathy. Lungs/Pleura: Paraseptal emphysema in both upper lobes with some progression since 2010. In the superior segment of the right lower lobe there is a spiculated approximately 29 x 40 x 35 millimeter mass/density corresponding to the radiographic abnormality this morning. This is in proximity to the posterior right hilum. No other acute pulmonary opacity to suggest infection. Major airways remain patent. There is chronic posterior/dependent lower lobe scarring  and atelectasis which is greater on the right. No pleural effusion. Upper Abdomen: Chronic hepatic steatosis. Otherwise negative visible liver, gallbladder, spleen, pancreas, adrenal glands and bowel.  Exophytic low-density probable simple cyst of the left kidney on series 3, image 157. Musculoskeletal: No acute or suspicious osseous lesion identified. IMPRESSION: 1. Spiculated 4 cm superior segment right lower lobe lung mass corresponding to the radiographic abnormality is highly suspicious for Bronchogenic Carcinoma, with associated Right hilar and subcarinal lymphadenopathy. 2.  Emphysema (ICD10-J43.9). 3. Calcified coronary artery atherosclerosis. Evidence of chronic fatty liver disease. Electronically Signed   By: Genevie Ann M.D.   On: 07/05/2018 09:23    EKG EKG Interpretation  Date/Time:  Monday July 05 2018 07:43:52 EST Ventricular Rate:  64 PR Interval:    QRS Duration: 106 QT Interval:  410 QTC Calculation: 423 R Axis:   -56 Text Interpretation:  Sinus rhythm Left anterior fascicular block Left axis deviation No significant change since last tracing Confirmed by Lajean Saver [phone]) on 07/05/2018 7:51:41 AM   Radiology Dg Chest 2 View  Result Date: 07/05/2018 CLINICAL DATA:  RIGHT-side abdominal pain for 2 weeks EXAM: CHEST - 2 VIEW COMPARISON:  02/15/2016 FINDINGS: Enlargement of cardiac silhouette. Mediastinal contours and pulmonary vascularity normal. Peribronchial thickening, chronic. On lateral view, abnormal density projects over the posterior midthoracic spine, poorly localized on AP view. This could represent subtle infiltrate or atelectasis but mass is not excluded. Remaining lungs clear. No pleural effusion or pneumothorax. No acute osseous findings. IMPRESSION: Enlargement of cardiac silhouette. Abnormal density projecting over the midthoracic spine on lateral view question infiltrate versus atelectasis or mass; further evaluation by CT chest with contrast recommended. Electronically Signed   By: Lavonia Dana M.D.   On: 07/05/2018 08:13   Ct Chest W Contrast  Result Date: 07/05/2018 CLINICAL DATA:  67 year old male with right side chest pain and abnormal right lung density on  radiographs today. EXAM: CT CHEST WITH CONTRAST TECHNIQUE: Multidetector CT imaging of the chest was performed during intravenous contrast administration. CONTRAST:  64mL OMNIPAQUE IOHEXOL 300 MG/ML  SOLN COMPARISON:  Chest radiographs 0804 hours today. CTA chest 12/16/2008. FINDINGS: Cardiovascular: No cardiomegaly or pericardial effusion. Calcified coronary artery atherosclerosis and/or stents (series 3, image 80). No significant atherosclerosis of the visible aorta. The left vertebral artery arises very near the arch (normal variant). Mediastinum/Nodes: There is new right hilar lymphadenopathy since the 2010 CT with nodes ranging from 10-17 millimeters short axis (series 3, images 64 and 69. Subcarinal nodal tissue has also increased up to 13 millimeters short axis. Anterior carina node measuring 9 millimeter short axis is larger but remains normal by size criteria. No left hilar lymphadenopathy. Lungs/Pleura: Paraseptal emphysema in both upper lobes with some progression since 2010. In the superior segment of the right lower lobe there is a spiculated approximately 29 x 40 x 35 millimeter mass/density corresponding to the radiographic abnormality this morning. This is in proximity to the posterior right hilum. No other acute pulmonary opacity to suggest infection. Major airways remain patent. There is chronic posterior/dependent lower lobe scarring and atelectasis which is greater on the right. No pleural effusion. Upper Abdomen: Chronic hepatic steatosis. Otherwise negative visible liver, gallbladder, spleen, pancreas, adrenal glands and bowel. Exophytic low-density probable simple cyst of the left kidney on series 3, image 157. Musculoskeletal: No acute or suspicious osseous lesion identified. IMPRESSION: 1. Spiculated 4 cm superior segment right lower lobe lung mass corresponding to the radiographic abnormality is highly suspicious for Bronchogenic Carcinoma, with associated  Right hilar and subcarinal  lymphadenopathy. 2.  Emphysema (ICD10-J43.9). 3. Calcified coronary artery atherosclerosis. Evidence of chronic fatty liver disease. Electronically Signed   By: Genevie Ann M.D.   On: 07/05/2018 09:23    Procedures Procedures (including critical care time)  Medications Ordered in ED Medications - No data to display   Initial Impression / Assessment and Plan / ED Course  I have reviewed the triage vital signs and the nursing notes.  Pertinent labs & imaging results that were available during my care of the patient were reviewed by me and considered in my medical decision making (see chart for details).  Iv ns. Labs sent. Imaging ordered.   Reviewed nursing notes and prior charts for additional history.   Labs reviewed - trop is normal.   cxr reviewed - ?mass. Radiologist rec CT.  Ct ordered.  Ultram po for pain (pt has ride, does not have to drive).   Recheck pt, no increased wob, pt comfortable.   CT reviewed - right lung mass suspicious for ca. Discussed ct with pt and need for close outpt f/u.     Final Clinical Impressions(s) / ED Diagnoses   Final diagnoses:  None    ED Discharge Orders    None       Lajean Saver, MD 07/05/18 1016

## 2018-07-08 ENCOUNTER — Telehealth: Payer: Self-pay | Admitting: Internal Medicine

## 2018-07-08 NOTE — Telephone Encounter (Signed)
A new patient appt has been scheduled for the pt to see Dr. Julien Nordmann on 3/9 at 215pm w/labs at 145pm. Appt date and time has been given to the pt's wife.

## 2018-07-09 ENCOUNTER — Other Ambulatory Visit: Payer: Self-pay | Admitting: Medical Oncology

## 2018-07-09 DIAGNOSIS — J984 Other disorders of lung: Secondary | ICD-10-CM

## 2018-07-12 ENCOUNTER — Inpatient Hospital Stay: Payer: Medicare Other

## 2018-07-12 ENCOUNTER — Inpatient Hospital Stay: Payer: Medicare Other | Attending: Internal Medicine | Admitting: Internal Medicine

## 2018-07-12 ENCOUNTER — Telehealth: Payer: Self-pay | Admitting: Internal Medicine

## 2018-07-12 DIAGNOSIS — I252 Old myocardial infarction: Secondary | ICD-10-CM | POA: Diagnosis not present

## 2018-07-12 DIAGNOSIS — J984 Other disorders of lung: Secondary | ICD-10-CM

## 2018-07-12 DIAGNOSIS — R918 Other nonspecific abnormal finding of lung field: Secondary | ICD-10-CM | POA: Diagnosis present

## 2018-07-12 DIAGNOSIS — Z8546 Personal history of malignant neoplasm of prostate: Secondary | ICD-10-CM | POA: Insufficient documentation

## 2018-07-12 DIAGNOSIS — Z803 Family history of malignant neoplasm of breast: Secondary | ICD-10-CM | POA: Insufficient documentation

## 2018-07-12 DIAGNOSIS — I10 Essential (primary) hypertension: Secondary | ICD-10-CM | POA: Insufficient documentation

## 2018-07-12 DIAGNOSIS — E039 Hypothyroidism, unspecified: Secondary | ICD-10-CM | POA: Diagnosis not present

## 2018-07-12 DIAGNOSIS — Z720 Tobacco use: Secondary | ICD-10-CM | POA: Diagnosis not present

## 2018-07-12 DIAGNOSIS — R0789 Other chest pain: Secondary | ICD-10-CM | POA: Diagnosis not present

## 2018-07-12 DIAGNOSIS — E119 Type 2 diabetes mellitus without complications: Secondary | ICD-10-CM | POA: Insufficient documentation

## 2018-07-12 DIAGNOSIS — C3490 Malignant neoplasm of unspecified part of unspecified bronchus or lung: Secondary | ICD-10-CM

## 2018-07-12 DIAGNOSIS — R05 Cough: Secondary | ICD-10-CM

## 2018-07-12 DIAGNOSIS — F1010 Alcohol abuse, uncomplicated: Secondary | ICD-10-CM | POA: Insufficient documentation

## 2018-07-12 LAB — CBC WITH DIFFERENTIAL (CANCER CENTER ONLY)
Abs Immature Granulocytes: 0.02 10*3/uL (ref 0.00–0.07)
BASOS PCT: 1 %
Basophils Absolute: 0.1 10*3/uL (ref 0.0–0.1)
Eosinophils Absolute: 0.2 10*3/uL (ref 0.0–0.5)
Eosinophils Relative: 3 %
HCT: 40 % (ref 39.0–52.0)
HEMOGLOBIN: 13.4 g/dL (ref 13.0–17.0)
Immature Granulocytes: 0 %
LYMPHS PCT: 31 %
Lymphs Abs: 2.4 10*3/uL (ref 0.7–4.0)
MCH: 31.7 pg (ref 26.0–34.0)
MCHC: 33.5 g/dL (ref 30.0–36.0)
MCV: 94.6 fL (ref 80.0–100.0)
Monocytes Absolute: 0.6 10*3/uL (ref 0.1–1.0)
Monocytes Relative: 8 %
NEUTROS ABS: 4.5 10*3/uL (ref 1.7–7.7)
Neutrophils Relative %: 57 %
Platelet Count: 214 10*3/uL (ref 150–400)
RBC: 4.23 MIL/uL (ref 4.22–5.81)
RDW: 13.4 % (ref 11.5–15.5)
WBC Count: 7.8 10*3/uL (ref 4.0–10.5)
nRBC: 0 % (ref 0.0–0.2)

## 2018-07-12 LAB — CMP (CANCER CENTER ONLY)
ALT: 19 U/L (ref 0–44)
AST: 17 U/L (ref 15–41)
Albumin: 3.6 g/dL (ref 3.5–5.0)
Alkaline Phosphatase: 61 U/L (ref 38–126)
Anion gap: 11 (ref 5–15)
BUN: 11 mg/dL (ref 8–23)
CO2: 25 mmol/L (ref 22–32)
Calcium: 8.8 mg/dL — ABNORMAL LOW (ref 8.9–10.3)
Chloride: 104 mmol/L (ref 98–111)
Creatinine: 0.99 mg/dL (ref 0.61–1.24)
GFR, Est AFR Am: >60 mL/min (ref >60)
GFR, Estimated: >60 mL/min (ref >60)
Glucose, Bld: 110 mg/dL — ABNORMAL HIGH (ref 70–99)
POTASSIUM: 4.4 mmol/L (ref 3.5–5.1)
Sodium: 140 mmol/L (ref 135–145)
TOTAL PROTEIN: 6.8 g/dL (ref 6.5–8.1)
Total Bilirubin: 1 mg/dL (ref 0.3–1.2)

## 2018-07-12 NOTE — Telephone Encounter (Signed)
Scheduled appt per 3/9 los,  Printed calendar and avs.

## 2018-07-12 NOTE — Progress Notes (Signed)
[employer] [phone]   [phone]  CONSULT NOTE  REFERRING PHYSICIAN: Dr. Lajean Saver  REASON FOR CONSULTATION:  68 years old African-American male with suspicious lung cancer.  HPI Zachary Knox is a 68 y.o. male with past medical history significant for diabetes mellitus, hypertension, hypothyroidism, myocardial infarction 2008, dyslipidemia, history of prostate cancer in 2004 as well as long history of smoking.  The patient mentioned that he has been complaining of pain on the right lower side of the chest and right upper quadrant of the abdomen for over a month.  He also has mild cough.  The patient presented to the emergency department for evaluation.  CT scan of the chest with contrast performed on July 05, 2018 was performed and it showed spiculated approximately 2.9 x 4.0 x 3.5 cm mass/density corresponding to the radiographic abnormality seen on previous chest x-ray and proximity to the posterior right hilum.  The scan also showed new right hilar lymphadenopathy as well as subcarinal nodal tissue increased up to 1.3 cm as well as anterior carinal node measuring 0.9 cm in short axis. The patient was referred to the clinic today for further evaluation and recommendation regarding treatment of his condition. When seen today he continues to complain of pain on the back of the lower side of the chest as well as right upper quadrant.  He is currently on pain medication from the emergency room physician.  He also has mild cough with no hemoptysis.  He denied having any shortness of breath.  He has no significant nausea, vomiting, diarrhea or constipation.  He denied having any weight loss or night sweats.  He has no headache or visual changes. Family history significant for mother died from brain aneurysm, father has unknown medical history and sister had breast cancer. The patient is married and he was accompanied by his wife Zachary Knox and his daughter  Zachary Knox.  He used to work as a Education officer, community.  He has a history of smoking 1 pack/day for around 35 years and quit in 2009.  He also drinks a lot of beer and alcohol on the weekend.  He has no history of drug abuse.  HPI  Past Medical History:  Diagnosis Date  . Diabetes mellitus without complication     Past Surgical History:  Procedure Laterality Date  . CARDIAC SURGERY     Heart attack in 2008 with stent placement  . stent in heart      No family history on file.  Social History Social History   Tobacco Use  . Smoking status: Former Smoker  Substance Use Topics  . Alcohol use: Yes    Comment: occassional   . Drug use: Not on file    No Known Allergies  Current Outpatient Medications  Medication Sig Dispense Refill  . acetaminophen (TYLENOL) 500 MG tablet Take 500 mg by mouth every 6 (six) hours as needed for mild pain.    Marland Kitchen amLODipine (NORVASC) 5 MG tablet Take 5 mg by mouth daily.    Marland Kitchen aspirin EC 81 MG tablet Take 162 mg by mouth daily.    . carvedilol (COREG) 6.25 MG tablet Take 6.25 mg by mouth 2 (two) times daily.    . diphenhydrAMINE (BENADRYL) 25 MG tablet Take 1 tablet (25 mg total) by mouth every 6 (six) hours. (Patient not taking: Reported on 07/05/2018) 20 tablet 0  . famotidine (PEPCID) 20 MG tablet Take 1 tablet (20 mg total) by  mouth 2 (two) times daily. (Patient not taking: Reported on 07/05/2018) 10 tablet 0  . gabapentin (NEURONTIN) 300 MG capsule Take 300 mg by mouth 3 (three) times daily.    Marland Kitchen levothyroxine (SYNTHROID, LEVOTHROID) 100 MCG tablet Take 100 mcg by mouth daily.    . metFORMIN (GLUCOPHAGE) 1000 MG tablet Take 500-1,000 mg by mouth See admin instructions. Take 1000mg  in the am, 500mg  at noon and 1000mg  in the pm.    . metoprolol succinate (TOPROL-XL) 50 MG 24 hr tablet Take 100 mg by mouth daily. Take with or immediately following a meal.    . pantoprazole (PROTONIX) 40 MG tablet Take 40 mg by mouth 2 (two) times daily.    .  ramipril (ALTACE) 10 MG capsule Take 10 mg by mouth daily.    Nelva Nay SOLOSTAR 300 UNIT/ML SOPN Inject 18 Units into the skin every morning.    . traMADol (ULTRAM) 50 MG tablet Take 1 tablet (50 mg total) by mouth every 6 (six) hours as needed. 20 tablet 0   No current facility-administered medications for this visit.     Review of Systems  Constitutional: negative Eyes: negative Ears, nose, mouth, throat, and face: negative Respiratory: positive for cough and pleurisy/chest pain Cardiovascular: negative Gastrointestinal: negative Genitourinary:negative Integument/breast: negative Hematologic/lymphatic: negative Musculoskeletal:negative Neurological: negative Behavioral/Psych: negative Endocrine: negative Allergic/Immunologic: negative  Physical Exam  WER:XVQMG, healthy, no distress, well nourished, well developed and anxious SKIN: skin color, texture, turgor are normal, no rashes or significant lesions HEAD: Normocephalic, No masses, lesions, tenderness or abnormalities EYES: normal, PERRLA, Conjunctiva are pink and non-injected EARS: External ears normal, Canals clear OROPHARYNX:no exudate, no erythema and lips, buccal mucosa, and tongue normal  NECK: supple, no adenopathy, no JVD LYMPH:  no palpable lymphadenopathy, no hepatosplenomegaly LUNGS: clear to auscultation , and palpation HEART: regular rate & rhythm, no murmurs and no gallops ABDOMEN:abdomen soft, non-tender, normal bowel sounds and no masses or organomegaly BACK: No CVA tenderness, Range of motion is normal EXTREMITIES:no joint deformities, effusion, or inflammation, no edema  NEURO: alert & oriented x 3 with fluent speech, no focal motor/sensory deficits  PERFORMANCE STATUS: ECOG 1  LABORATORY DATA: Lab Results  Component Value Date   WBC 7.8 07/12/2018   HGB 13.4 07/12/2018   HCT 40.0 07/12/2018   MCV 94.6 07/12/2018   PLT 214 07/12/2018      Chemistry      Component Value Date/Time   NA 137  07/05/2018 0751   K 4.0 07/05/2018 0751   CL 105 07/05/2018 0751   CO2 21 (L) 07/05/2018 0751   BUN 9 07/05/2018 0751   CREATININE 0.92 07/05/2018 0751      Component Value Date/Time   CALCIUM 8.8 (L) 07/05/2018 0751   ALKPHOS 60 07/05/2018 0751   AST 25 07/05/2018 0751   ALT 22 07/05/2018 0751   BILITOT 1.1 07/05/2018 0751       RADIOGRAPHIC STUDIES: Dg Chest 2 View  Result Date: 07/05/2018 CLINICAL DATA:  RIGHT-side abdominal pain for 2 weeks EXAM: CHEST - 2 VIEW COMPARISON:  02/15/2016 FINDINGS: Enlargement of cardiac silhouette. Mediastinal contours and pulmonary vascularity normal. Peribronchial thickening, chronic. On lateral view, abnormal density projects over the posterior midthoracic spine, poorly localized on AP view. This could represent subtle infiltrate or atelectasis but mass is not excluded. Remaining lungs clear. No pleural effusion or pneumothorax. No acute osseous findings. IMPRESSION: Enlargement of cardiac silhouette. Abnormal density projecting over the midthoracic spine on lateral view question infiltrate versus atelectasis or  mass; further evaluation by CT chest with contrast recommended. Electronically Signed   By: Lavonia Dana M.D.   On: 07/05/2018 08:13   Ct Chest W Contrast  Result Date: 07/05/2018 CLINICAL DATA:  68 year old male with right side chest pain and abnormal right lung density on radiographs today. EXAM: CT CHEST WITH CONTRAST TECHNIQUE: Multidetector CT imaging of the chest was performed during intravenous contrast administration. CONTRAST:  20mL OMNIPAQUE IOHEXOL 300 MG/ML  SOLN COMPARISON:  Chest radiographs 0804 hours today. CTA chest 12/16/2008. FINDINGS: Cardiovascular: No cardiomegaly or pericardial effusion. Calcified coronary artery atherosclerosis and/or stents (series 3, image 80). No significant atherosclerosis of the visible aorta. The left vertebral artery arises very near the arch (normal variant). Mediastinum/Nodes: There is new right hilar  lymphadenopathy since the 2010 CT with nodes ranging from 10-17 millimeters short axis (series 3, images 64 and 69. Subcarinal nodal tissue has also increased up to 13 millimeters short axis. Anterior carina node measuring 9 millimeter short axis is larger but remains normal by size criteria. No left hilar lymphadenopathy. Lungs/Pleura: Paraseptal emphysema in both upper lobes with some progression since 2010. In the superior segment of the right lower lobe there is a spiculated approximately 29 x 40 x 35 millimeter mass/density corresponding to the radiographic abnormality this morning. This is in proximity to the posterior right hilum. No other acute pulmonary opacity to suggest infection. Major airways remain patent. There is chronic posterior/dependent lower lobe scarring and atelectasis which is greater on the right. No pleural effusion. Upper Abdomen: Chronic hepatic steatosis. Otherwise negative visible liver, gallbladder, spleen, pancreas, adrenal glands and bowel. Exophytic low-density probable simple cyst of the left kidney on series 3, image 157. Musculoskeletal: No acute or suspicious osseous lesion identified. IMPRESSION: 1. Spiculated 4 cm superior segment right lower lobe lung mass corresponding to the radiographic abnormality is highly suspicious for Bronchogenic Carcinoma, with associated Right hilar and subcarinal lymphadenopathy. 2.  Emphysema (ICD10-J43.9). 3. Calcified coronary artery atherosclerosis. Evidence of chronic fatty liver disease. Electronically Signed   By: Genevie Ann M.D.   On: 07/05/2018 09:23    ASSESSMENT: This is a very pleasant 68 years old African-American male with highly suspicious stage IIIa (T2b, N2, M0) lung cancer, pending tissue diagnosis and staging work-up.  He presented with right lower lobe lung mass in addition to right hilar and mediastinal lymphadenopathy.   PLAN: I had a lengthy discussion with the patient and his family today about his current condition and  further investigation to confirm his diagnosis. I personally and independently reviewed the scan images and discussed the result and showed the images to the patient and his family. I recommended for the patient to complete the staging work-up by ordering a PET scan as well as MRI of the brain to rule out brain metastasis. Once the PET scan becomes available, we will decide on the best location for tissue diagnosis was that with CT-guided core biopsy of the right lower lobe lung mass or bronchoscopy with endobronchial ultrasound and evaluation of the mediastinal lymph nodes as well. I will arrange for the patient to come back for follow-up visit in less than 2 weeks for evaluation and discussion of the next step of his management after the imaging studies. For pain management he will continue with his current pain medication for now. For hypertension and diabetes mellitus the patient will continue with his current home medications. For smoking cessation and alcohol abuse, I strongly encouraged the patient to quit both smoking and alcohol drinking.  He was advised to call immediately if he has any concerning symptoms in the interval. The patient voices understanding of current disease status and treatment options and is in agreement with the current care plan.  All questions were answered. The patient knows to call the clinic with any problems, questions or concerns. We can certainly see the patient much sooner if necessary.  Thank you so much for allowing me to participate in the care of LYNDOL VANDERHEIDEN. I will continue to follow up the patient with you and assist in his care.  I spent 40 minutes counseling the patient face to face. The total time spent in the appointment was 60 minutes.  Disclaimer: This note was dictated with voice recognition software. Similar sounding words can inadvertently be transcribed and may not be corrected upon review.   Eilleen Kempf July 12, 2018, 2:49 PM

## 2018-07-20 ENCOUNTER — Other Ambulatory Visit: Payer: Self-pay

## 2018-07-20 ENCOUNTER — Ambulatory Visit (HOSPITAL_COMMUNITY)
Admission: RE | Admit: 2018-07-20 | Discharge: 2018-07-20 | Disposition: A | Discharge status: Home / Self Care | Payer: Medicare Other | Source: Ambulatory Visit | Attending: Internal Medicine | Admitting: Internal Medicine

## 2018-07-20 ENCOUNTER — Emergency Department (HOSPITAL_COMMUNITY)
Admission: EM | Admit: 2018-07-20 | Discharge: 2018-07-21 | Disposition: A | Discharge status: Home / Self Care | Payer: Medicare Other | Attending: Emergency Medicine | Admitting: Emergency Medicine

## 2018-07-20 ENCOUNTER — Encounter (HOSPITAL_COMMUNITY)
Admission: RE | Admit: 2018-07-20 | Discharge: 2018-07-20 | Disposition: A | Discharge status: Home / Self Care | Payer: Medicare Other | Source: Ambulatory Visit | Attending: Internal Medicine | Admitting: Internal Medicine

## 2018-07-20 ENCOUNTER — Encounter (HOSPITAL_COMMUNITY): Payer: Self-pay | Admitting: *Deleted

## 2018-07-20 ENCOUNTER — Emergency Department (HOSPITAL_COMMUNITY): Payer: Medicare Other

## 2018-07-20 DIAGNOSIS — Z87891 Personal history of nicotine dependence: Secondary | ICD-10-CM | POA: Diagnosis not present

## 2018-07-20 DIAGNOSIS — C3491 Malignant neoplasm of unspecified part of right bronchus or lung: Secondary | ICD-10-CM | POA: Diagnosis not present

## 2018-07-20 DIAGNOSIS — Z79899 Other long term (current) drug therapy: Secondary | ICD-10-CM | POA: Insufficient documentation

## 2018-07-20 DIAGNOSIS — C7951 Secondary malignant neoplasm of bone: Secondary | ICD-10-CM | POA: Diagnosis not present

## 2018-07-20 DIAGNOSIS — J439 Emphysema, unspecified: Secondary | ICD-10-CM | POA: Insufficient documentation

## 2018-07-20 DIAGNOSIS — E119 Type 2 diabetes mellitus without complications: Secondary | ICD-10-CM | POA: Insufficient documentation

## 2018-07-20 DIAGNOSIS — C3490 Malignant neoplasm of unspecified part of unspecified bronchus or lung: Secondary | ICD-10-CM

## 2018-07-20 DIAGNOSIS — I700 Atherosclerosis of aorta: Secondary | ICD-10-CM | POA: Diagnosis not present

## 2018-07-20 DIAGNOSIS — R1011 Right upper quadrant pain: Secondary | ICD-10-CM | POA: Diagnosis present

## 2018-07-20 DIAGNOSIS — Z7982 Long term (current) use of aspirin: Secondary | ICD-10-CM | POA: Insufficient documentation

## 2018-07-20 LAB — BASIC METABOLIC PANEL
Anion gap: 10 (ref 5–15)
BUN: 14 mg/dL (ref 8–23)
CO2: 20 mmol/L — ABNORMAL LOW (ref 22–32)
Calcium: 9 mg/dL (ref 8.9–10.3)
Chloride: 106 mmol/L (ref 98–111)
Creatinine, Ser: 0.84 mg/dL (ref 0.61–1.24)
GFR calc non Af Amer: >60 mL/min (ref >60)
Glucose, Bld: 139 mg/dL — ABNORMAL HIGH (ref 70–99)
Potassium: 4.4 mmol/L (ref 3.5–5.1)
Sodium: 136 mmol/L (ref 135–145)

## 2018-07-20 LAB — CBC
HCT: 40.7 % (ref 39.0–52.0)
Hemoglobin: 13.8 g/dL (ref 13.0–17.0)
MCH: 32.1 pg (ref 26.0–34.0)
MCHC: 33.9 g/dL (ref 30.0–36.0)
MCV: 94.7 fL (ref 80.0–100.0)
Platelets: 232 10*3/uL (ref 150–400)
RBC: 4.3 MIL/uL (ref 4.22–5.81)
RDW: 13.2 % (ref 11.5–15.5)
WBC: 8.1 10*3/uL (ref 4.0–10.5)
nRBC: 0 % (ref 0.0–0.2)

## 2018-07-20 LAB — GLUCOSE, CAPILLARY: Glucose-Capillary: 130 mg/dL — ABNORMAL HIGH (ref 70–99)

## 2018-07-20 MED ORDER — HYDROMORPHONE HCL 1 MG/ML IJ SOLN
1.0000 mg | Freq: Once | INTRAMUSCULAR | Status: AC
  Administered 2018-07-21: 1 mg via INTRAVENOUS
  Filled 2018-07-20: qty 1

## 2018-07-20 MED ORDER — OXYCODONE-ACETAMINOPHEN 5-325 MG PO TABS: 1.0000 | ORAL_TABLET | ORAL | 0 refills | Status: DC | PRN

## 2018-07-20 MED ORDER — GADOBUTROL 1 MMOL/ML IV SOLN
8.0000 mL | Freq: Once | INTRAVENOUS | Status: AC | PRN
  Administered 2018-07-20: 7.5 mL via INTRAVENOUS

## 2018-07-20 MED ORDER — SODIUM CHLORIDE (PF) 0.9 % IJ SOLN
INTRAMUSCULAR | Status: AC
  Filled 2018-07-20: qty 50

## 2018-07-20 MED ORDER — IOHEXOL 350 MG/ML SOLN
100.0000 mL | Freq: Once | INTRAVENOUS | Status: AC | PRN
  Administered 2018-07-20: 100 mL via INTRAVENOUS

## 2018-07-20 MED ORDER — FLUDEOXYGLUCOSE F - 18 (FDG) INJECTION
10.1500 | Freq: Once | INTRAVENOUS | Status: AC | PRN
  Administered 2018-07-20: 10.15 via INTRAVENOUS

## 2018-07-20 MED ORDER — HYDROMORPHONE HCL 1 MG/ML IJ SOLN
1.0000 mg | Freq: Once | INTRAMUSCULAR | Status: AC
  Administered 2018-07-20: 1 mg via INTRAVENOUS
  Filled 2018-07-20: qty 1

## 2018-07-20 NOTE — ED Triage Notes (Signed)
Pt reports RUQ pain that radiates around to his R back.  Sxs onset was about a month and half ago, worsened 2 weeks ago so he went to [facility] ED.  They found a mass in his R lung.  Had an MRI and CT today.  Pt reports pain is severe today.  He has taken a Tramadol without relief.  He denies any cough or fever/chills.

## 2018-07-20 NOTE — ED Provider Notes (Signed)
[place] DEPT Provider Note   CSN: [id number] Arrival date & time: 07/20/18  2057    History   Chief Complaint Chief Complaint  Patient presents with  . Abdominal Pain  . Back Pain    HPI Zachary Knox is a 68 y.o. male.   HPI Patient presents to the emergency room for evaluation of right-sided chest pain.  Patient has a history of lung cancer.  Patient was recently diagnosed by CT scan earlier this month.  His CT scan demonstrated a 4 cm spiculated mass in the right lower lobe.  Patient has followed up with oncology and had a PET scan as well as an MRI tonight.  Patient states tonight he started having more acute right-sided chest pain.  It hurts when he takes a deep breath.  He has been taking tramadol that previously was controlling his pain but not today.  Patient denies any leg swelling.  No fevers.  No vomiting or diarrhea. Past Medical History:  Diagnosis Date  . Diabetes mellitus without complication [facility])     Patient Active Problem List   Diagnosis Date Noted  . Tobacco abuse disorder 07/12/2018  . Alcohol abuse 07/12/2018    Past Surgical History:  Procedure Laterality Date  . CARDIAC SURGERY     Heart attack in 2008 with stent placement  . stent in heart          Home Medications    Prior to Admission medications   Medication Sig Start Date End Date Taking? Authorizing Provider  acetaminophen (TYLENOL) 500 MG tablet Take 1,000 mg by mouth every 6 (six) hours as needed for mild pain.    Yes [provider]  amLODipine (NORVASC) 5 MG tablet Take 5 mg by mouth daily. 05/30/18  Yes [provider]  aspirin EC 81 MG tablet Take 81 mg by mouth daily.    Yes [provider]  carvedilol (COREG) 6.25 MG tablet Take 6.25 mg by mouth 2 (two) times daily. 06/26/18  Yes [provider]  gabapentin (NEURONTIN) 300 MG capsule Take 300 mg by mouth at bedtime.  02/11/18  Yes [provider]   levothyroxine (SYNTHROID, LEVOTHROID) 100 MCG tablet Take 100 mcg by mouth daily. 06/26/18  Yes [provider]  metFORMIN (GLUCOPHAGE) 1000 MG tablet Take 500-1,000 mg by mouth See admin instructions. Take 1000mg  in the am and 500mg  in the pm.   Yes [provider]  metoprolol succinate (TOPROL-XL) 50 MG 24 hr tablet Take 100 mg by mouth daily. Take with or immediately following a meal.   Yes [provider]  pantoprazole (PROTONIX) 40 MG tablet Take 40 mg by mouth 2 (two) times daily. 05/17/18  Yes [provider]  ramipril (ALTACE) 10 MG capsule Take 10 mg by mouth daily. 04/21/18  Yes [provider]  TOUJEO SOLOSTAR 300 UNIT/ML SOPN Inject 18 Units into the skin every morning. 06/16/18  Yes [provider]  traMADol (ULTRAM) 50 MG tablet Take 1 tablet (50 mg total) by mouth every 6 (six) hours as needed. Patient taking differently: Take 50 mg by mouth every 6 (six) hours as needed for moderate pain.  07/05/18  Yes Lajean Saver, MD  diphenhydrAMINE (BENADRYL) 25 MG tablet Take 1 tablet (25 mg total) by mouth every 6 (six) hours. Patient not taking: Reported on 07/05/2018 08/23/12   Coralee North A, PA-C  famotidine (PEPCID) 20 MG tablet Take 1 tablet (20 mg total) by mouth 2 (two) times daily.  Patient not taking: Reported on 07/20/2018 08/23/12   Marny Lowenstein, PA-C  oxyCODONE-acetaminophen (PERCOCET/ROXICET) 5-325 MG tablet Take 1 tablet by mouth every 4 (four) hours as needed for severe pain. 07/20/18   Dorie Rank, MD    Family History No family history on file.  Social History Social History   Tobacco Use  . Smoking status: Former Research scientist (life sciences)  . Smokeless tobacco: Never Used  Substance Use Topics  . Alcohol use: Yes    Comment: occassional   . Drug use: Not on file     Allergies   Patient has no known allergies.   Review of Systems Review of Systems  All other systems reviewed and are negative.    Physical Exam Updated Vital  Signs BP 129/83 (BP Location: Left Arm)   Pulse 66   Temp 97.6 F (36.4 C) (Oral)   Resp 16   Ht 1.753 m (5\' 9" )   Wt 81.6 kg   SpO2 100%   BMI 26.58 kg/m   Physical Exam Vitals signs and nursing note reviewed.  Constitutional:      General: He is not in acute distress.    Appearance: He is well-developed.  HENT:     Head: Normocephalic and atraumatic.     Right Ear: External ear normal.     Left Ear: External ear normal.  Eyes:     General: No scleral icterus.       Right eye: No discharge.        Left eye: No discharge.     Conjunctiva/sclera: Conjunctivae normal.  Neck:     Musculoskeletal: Neck supple.     Trachea: No tracheal deviation.  Cardiovascular:     Rate and Rhythm: Normal rate and regular rhythm.  Pulmonary:     Effort: Pulmonary effort is normal. No respiratory distress.     Breath sounds: Normal breath sounds. No stridor. No wheezing or rales.  Abdominal:     General: Bowel sounds are normal. There is no distension.     Palpations: Abdomen is soft.     Tenderness: There is no abdominal tenderness. There is no guarding or rebound.  Musculoskeletal:        General: No tenderness.  Skin:    General: Skin is warm and dry.     Findings: No rash.  Neurological:     Mental Status: He is alert.     Cranial Nerves: No cranial nerve deficit (no facial droop, extraocular movements intact, no slurred speech).     Sensory: No sensory deficit.     Motor: No abnormal muscle tone or seizure activity.     Coordination: Coordination normal.      ED Treatments / Results  Labs (all labs ordered are listed, but only abnormal results are displayed) Labs Reviewed  BASIC METABOLIC PANEL - Abnormal; Notable for the following components:      Result Value   CO2 20 (*)    Glucose, Bld 139 (*)    All other components within normal limits  CBC    EKG None  Radiology Ct Angio Chest Pe W And/or Wo Contrast  Result Date: 07/20/2018 CLINICAL DATA:  Pt reports RUQ  pain that radiates around to his R back. Sxs onset was about a month and half ago, worsened 2 weeks ago so he went to [facility]. They found a mass in his R lung. Had an MRI and PET scan today. Pt reports pain is severe today. He has taken a Tramadol without  relief. He denies any cough or fever/chills. EXAM: CT ANGIOGRAPHY CHEST WITH CONTRAST TECHNIQUE: Multidetector CT imaging of the chest was performed using the standard protocol during bolus administration of intravenous contrast. Multiplanar CT image reconstructions and MIPs were obtained to evaluate the vascular anatomy. CONTRAST:  14mL OMNIPAQUE IOHEXOL 350 MG/ML SOLN COMPARISON:  PET-CT, 07/20/2018.  Chest CT 07/05/2018. FINDINGS: Cardiovascular: There is satisfactory opacification of the pulmonary arteries to the segmental level. No evidence of a pulmonary embolism. Heart is top-normal in size. Three-vessel coronary artery calcifications. No pericardial effusion. Great vessels normal caliber. Mild aortic atherosclerosis. No aortic dissection. Mediastinum/Nodes: Prominent right hilar and subcarinal nodes, hypermetabolic on PET-CT consistent with metastatic disease. No other mediastinal adenopathy. No left hilar adenopathy. No mediastinal masses. No neck base or axillary masses or adenopathy. Trachea and esophagus are unremarkable. Lungs/Pleura: Mass, posterior aspect of the right lobe superior segment, measuring 3.4 x 2.1 x 3.3 cm on the current study, without change from the prior exams allowing for differences in measurement technique. Dependent linear and reticular opacities in the lower lobes consistent with subsegmental atelectasis. Centrilobular and paraseptal emphysema in the upper lobes. No evidence of pneumonia or pulmonary edema. No pleural effusion or pneumothorax. Upper Abdomen: No acute findings. Musculoskeletal: Destructive bone lesion, posterolateral right eighth rib consistent with metastatic disease as noted on the current PET-CT. No other bone  lesions. Review of the MIP images confirms the above findings. IMPRESSION: 1. No evidence of a pulmonary embolism. 2. Right lung mass consistent with bronchogenic carcinoma with right hilar and subcarinal metastatic adenopathy. 3. Destructive metastatic lesion in the posterolateral right eighth rib. This may be the source of the patient's back pain. 4. Chronic findings of centrilobular and paraseptal emphysema, coronary artery calcifications and mild aortic atherosclerosis. Aortic Atherosclerosis (ICD10-I70.0) and Emphysema (ICD10-J43.9). Electronically Signed   By: Lajean Manes M.D.   On: 07/20/2018 23:27   Mr Jeri Cos VE Contrast  Result Date: 07/20/2018 CLINICAL DATA:  Lung cancer staging EXAM: MRI HEAD WITHOUT AND WITH CONTRAST TECHNIQUE: Multiplanar, multiecho pulse sequences of the brain and surrounding structures were obtained without and with intravenous contrast. CONTRAST:  8 mL Gadovist IV COMPARISON:  CT head 12/30/2005 FINDINGS: Brain: Ventricle size and cerebral volume normal. Minimal chronic changes in the periventricular white matter. No acute infarct. Negative for hemorrhage mass or edema. No enhancing metastatic deposits are identified. Vascular: Normal arterial flow voids Skull and upper cervical spine: Negative Sinuses/Orbits: Paranasal sinuses clear. Small right mastoid effusion. Normal orbit. Other: None IMPRESSION: Negative for metastatic disease to the brain. No acute abnormality. Minimal chronic white matter changes. Electronically Signed   By: Franchot Gallo M.D.   On: 07/20/2018 13:40   Nm Pet Image Initial (pi) Skull Base To Thigh  Result Date: 07/20/2018 CLINICAL DATA:  Initial treatment strategy for non-small-cell lung cancer. EXAM: NUCLEAR MEDICINE PET SKULL BASE TO THIGH TECHNIQUE: 10.2 mCi F-18 FDG was injected intravenously. Full-ring PET imaging was performed from the skull base to thigh after the radiotracer. CT data was obtained and used for attenuation correction and  anatomic localization. Fasting blood glucose: 130 mg/dl COMPARISON:  Chest CT 07/05/2018 FINDINGS: Mediastinal blood pool activity: SUV max 3.3 NECK: No hypermetabolic lymph nodes in the neck. Incidental CT findings: none CHEST: The posterior right lower lobe mass is markedly hypermetabolic with SUV max = 9.6. Hypermetabolic metastases to the right hilum demonstrates SUV max = 8.8. Hypermetabolic subcarinal nodal metastases demonstrate SUV max = 10.8. Hypermetabolic small precarinal lymph node associated. Incidental CT  findings: Atherosclerotic calcification is noted in the wall of the thoracic aorta. Coronary artery calcification is evident. Centrilobular and paraseptal emphysema noted in the lungs bilaterally. ABDOMEN/PELVIS: No abnormal hypermetabolic activity within the liver, pancreas, adrenal glands, or spleen. No hypermetabolic lymph nodes in the abdomen or pelvis. Small focus of FDG uptake in the posterior stomach likely physiologic. Incidental CT findings: none SKELETON: 2.6 x 1.1 cm lesion in the posterior right eighth rib (83/4) is markedly hypermetabolic with SUV max = 8.6. Low level uptake in the right SI joint is probably degenerative. No other suspicious hypermetabolic bony disease evident. Incidental CT findings: none IMPRESSION: 1. Right lower lobe pulmonary mass is markedly hypermetabolic consistent with history of non-small-cell lung cancer. Hypermetabolic metastatic disease is seen in the right hilum and mediastinum. 2. Hypermetabolic posterior right eighth rib lesion consistent with bony metastatic involvement. No additional hypermetabolic bone metastases evident. 3.  Aortic Atherosclerois (ICD10-170.0) 4.  Emphysema. (DVV61-Y07.9) Electronically Signed   By: Zachary Stanley M.D.   On: 07/20/2018 17:02    Procedures Procedures (including critical care time)  Medications Ordered in ED Medications  sodium chloride (PF) 0.9 % injection (has no administration in time range)  HYDROmorphone  (DILAUDID) injection 1 mg (has no administration in time range)  HYDROmorphone (DILAUDID) injection 1 mg (1 mg Intravenous Given 07/20/18 2155)  iohexol (OMNIPAQUE) 350 MG/ML injection 100 mL (100 mLs Intravenous Contrast Given 07/20/18 2259)     Initial Impression / Assessment and Plan / ED Course  I have reviewed the triage vital signs and the nursing notes.  Pertinent labs & imaging results that were available during my care of the patient were reviewed by me and considered in my medical decision making (see chart for details).   Patient presented to the emergency room for evaluation of right-sided chest pain.  Patient has a known history of lung cancer.  He did have acute worsening of his pain and felt short of breath at times therefore CT scan was done to rule out PE.  No evidence of PE but he does have a lytic lesion in his ribs.  I suspect this is the cause of his pain.  Patient improved with Dilaudid.  He is taking Ultram at home and I will give a prescription for Percocet.  Patient is stable for continued outpatient follow-up.  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

## 2018-07-20 NOTE — Discharge Instructions (Signed)
Take the medications as prescribed for pain, follow-up with Dr. Earlie Server for further treatment

## 2018-07-22 ENCOUNTER — Telehealth: Payer: Self-pay | Admitting: Medical Oncology

## 2018-07-22 NOTE — Telephone Encounter (Signed)
Negative on Travel screen questions.

## 2018-07-23 ENCOUNTER — Inpatient Hospital Stay (HOSPITAL_BASED_OUTPATIENT_CLINIC_OR_DEPARTMENT_OTHER): Payer: Medicare Other | Admitting: Internal Medicine

## 2018-07-23 ENCOUNTER — Telehealth: Payer: Self-pay | Admitting: Internal Medicine

## 2018-07-23 ENCOUNTER — Telehealth: Payer: Self-pay | Admitting: Radiation Oncology

## 2018-07-23 ENCOUNTER — Encounter: Payer: Self-pay | Admitting: Internal Medicine

## 2018-07-23 ENCOUNTER — Other Ambulatory Visit: Payer: Self-pay

## 2018-07-23 VITALS — BP 118/79 | HR 63 | Temp 97.9°F | Resp 18 | Ht 69.0 in | Wt 182.5 lb

## 2018-07-23 DIAGNOSIS — Z720 Tobacco use: Secondary | ICD-10-CM

## 2018-07-23 DIAGNOSIS — R079 Chest pain, unspecified: Secondary | ICD-10-CM

## 2018-07-23 DIAGNOSIS — R918 Other nonspecific abnormal finding of lung field: Secondary | ICD-10-CM

## 2018-07-23 DIAGNOSIS — R599 Enlarged lymph nodes, unspecified: Secondary | ICD-10-CM

## 2018-07-23 DIAGNOSIS — C7951 Secondary malignant neoplasm of bone: Secondary | ICD-10-CM

## 2018-07-23 DIAGNOSIS — G893 Neoplasm related pain (acute) (chronic): Secondary | ICD-10-CM | POA: Insufficient documentation

## 2018-07-23 NOTE — Telephone Encounter (Signed)
Gave avs and calendar ° °

## 2018-07-23 NOTE — Telephone Encounter (Signed)
New Message:    Cld pt to schedule from referral received on 07/23/18

## 2018-07-23 NOTE — Progress Notes (Signed)
[place] [phone]   [phone]  OFFICE PROGRESS NOTE  Vincente Liberty, MD [place] [address]  DIAGNOSIS: suspicious stage IV (T2b, N2, M1c) lung cancer, pending tissue diagnosis presented with right lower lobe lung mass as well as right hilar and mediastinal lymphadenopathy as well as bone metastasis in the right eighth rib.  PRIOR THERAPY: None  CURRENT THERAPY: None  INTERVAL HISTORY: Zachary Knox 68 y.o. male returns to the clinic today for follow-up visit accompanied by his wife.  His daughter and son were available by phone.  The patient is feeling fine today with no concerning complaints except for pain on the right side of the chest.  He denied having any recent shortness of breath, cough or hemoptysis.  He denied having any fever or chills.  He has no nausea, vomiting, diarrhea or constipation.  He has no headache or visual changes.  The patient had several studies performed recently including a PET scan as well as MRI of the brain.  He is here today for evaluation and discussion of his scan results and treatment options.  MEDICAL HISTORY: Past Medical History:  Diagnosis Date   Diabetes mellitus without complication ([place])     ALLERGIES:  has No Known Allergies.  MEDICATIONS:  Current Outpatient Medications  Medication Sig Dispense Refill   acetaminophen (TYLENOL) 500 MG tablet Take 1,000 mg by mouth every 6 (six) hours as needed for mild pain.      amLODipine (NORVASC) 5 MG tablet Take 5 mg by mouth daily.     aspirin EC 81 MG tablet Take 81 mg by mouth daily.      carvedilol (COREG) 6.25 MG tablet Take 6.25 mg by mouth 2 (two) times daily.     diphenhydrAMINE (BENADRYL) 25 MG tablet Take 1 tablet (25 mg total) by mouth every 6 (six) hours. (Patient not taking: Reported on 07/05/2018) 20 tablet 0   famotidine (PEPCID) 20 MG tablet Take 1 tablet (20 mg total) by mouth 2 (two) times daily.  (Patient not taking: Reported on 07/20/2018) 10 tablet 0   gabapentin (NEURONTIN) 300 MG capsule Take 300 mg by mouth at bedtime.      levothyroxine (SYNTHROID, LEVOTHROID) 100 MCG tablet Take 100 mcg by mouth daily.     metFORMIN (GLUCOPHAGE) 1000 MG tablet Take 500-1,000 mg by mouth See admin instructions. Take 1000mg  in the am and 500mg  in the pm.     metoprolol succinate (TOPROL-XL) 50 MG 24 hr tablet Take 100 mg by mouth daily. Take with or immediately following a meal.     oxyCODONE-acetaminophen (PERCOCET/ROXICET) 5-325 MG tablet Take 1 tablet by mouth every 4 (four) hours as needed for severe pain. 30 tablet 0   pantoprazole (PROTONIX) 40 MG tablet Take 40 mg by mouth 2 (two) times daily.     ramipril (ALTACE) 10 MG capsule Take 10 mg by mouth daily.     TOUJEO SOLOSTAR 300 UNIT/ML SOPN Inject 18 Units into the skin every morning.     traMADol (ULTRAM) 50 MG tablet Take 1 tablet (50 mg total) by mouth every 6 (six) hours as needed. (Patient taking differently: Take 50 mg by mouth every 6 (six) hours as needed for moderate pain. ) 20 tablet 0   No current facility-administered medications for this visit.     SURGICAL HISTORY:  Past Surgical History:  Procedure Laterality Date   CARDIAC SURGERY     Heart attack in  2008 with stent placement   stent in heart      REVIEW OF SYSTEMS:  Constitutional: positive for fatigue Eyes: negative Ears, nose, mouth, throat, and face: negative Respiratory: positive for pleurisy/chest pain Cardiovascular: negative Gastrointestinal: negative Genitourinary:negative Integument/breast: negative Hematologic/lymphatic: negative Musculoskeletal:negative Neurological: negative Behavioral/Psych: negative Endocrine: negative Allergic/Immunologic: negative   PHYSICAL EXAMINATION: General appearance: alert, cooperative, fatigued and no distress Head: Normocephalic, without obvious abnormality, atraumatic Neck: no adenopathy, no JVD, supple,  symmetrical, trachea midline and thyroid not enlarged, symmetric, no tenderness/mass/nodules Lymph nodes: Cervical, supraclavicular, and axillary nodes normal. Resp: clear to auscultation bilaterally Back: symmetric, no curvature. ROM normal. No CVA tenderness. Cardio: regular rate and rhythm, S1, S2 normal, no murmur, click, rub or gallop GI: soft, non-tender; bowel sounds normal; no masses,  no organomegaly Extremities: extremities normal, atraumatic, no cyanosis or edema Neurologic: Alert and oriented X 3, normal strength and tone. Normal symmetric reflexes. Normal coordination and gait  ECOG PERFORMANCE STATUS: 1 - Symptomatic but completely ambulatory  Blood pressure 118/79, pulse 63, temperature 97.9 F (36.6 C), temperature source Oral, resp. rate 18, height 5\' 9"  (1.753 m), weight 182 lb 8 oz (82.8 kg), SpO2 99 %.  LABORATORY DATA: Lab Results  Component Value Date   WBC 8.1 07/20/2018   HGB 13.8 07/20/2018   HCT 40.7 07/20/2018   MCV 94.7 07/20/2018   PLT 232 07/20/2018      Chemistry      Component Value Date/Time   NA 136 07/20/2018 2156   K 4.4 07/20/2018 2156   CL 106 07/20/2018 2156   CO2 20 (L) 07/20/2018 2156   BUN 14 07/20/2018 2156   CREATININE 0.84 07/20/2018 2156   CREATININE 0.99 07/12/2018 1417      Component Value Date/Time   CALCIUM 9.0 07/20/2018 2156   ALKPHOS 61 07/12/2018 1417   AST 17 07/12/2018 1417   ALT 19 07/12/2018 1417   BILITOT 1.0 07/12/2018 1417       RADIOGRAPHIC STUDIES: Dg Chest 2 View  Result Date: 07/05/2018 CLINICAL DATA:  RIGHT-side abdominal pain for 2 weeks EXAM: CHEST - 2 VIEW COMPARISON:  02/15/2016 FINDINGS: Enlargement of cardiac silhouette. Mediastinal contours and pulmonary vascularity normal. Peribronchial thickening, chronic. On lateral view, abnormal density projects over the posterior midthoracic spine, poorly localized on AP view. This could represent subtle infiltrate or atelectasis but mass is not excluded.  Remaining lungs clear. No pleural effusion or pneumothorax. No acute osseous findings. IMPRESSION: Enlargement of cardiac silhouette. Abnormal density projecting over the midthoracic spine on lateral view question infiltrate versus atelectasis or mass; further evaluation by CT chest with contrast recommended. Electronically Signed   By: Lavonia Dana M.D.   On: 07/05/2018 08:13   Ct Chest W Contrast  Result Date: 07/05/2018 CLINICAL DATA:  68 year old male with right side chest pain and abnormal right lung density on radiographs today. EXAM: CT CHEST WITH CONTRAST TECHNIQUE: Multidetector CT imaging of the chest was performed during intravenous contrast administration. CONTRAST:  4mL OMNIPAQUE IOHEXOL 300 MG/ML  SOLN COMPARISON:  Chest radiographs 0804 hours today. CTA chest 12/16/2008. FINDINGS: Cardiovascular: No cardiomegaly or pericardial effusion. Calcified coronary artery atherosclerosis and/or stents (series 3, image 80). No significant atherosclerosis of the visible aorta. The left vertebral artery arises very near the arch (normal variant). Mediastinum/Nodes: There is new right hilar lymphadenopathy since the 2010 CT with nodes ranging from 10-17 millimeters short axis (series 3, images 64 and 69. Subcarinal nodal tissue has also increased up to 13 millimeters short axis.  Anterior carina node measuring 9 millimeter short axis is larger but remains normal by size criteria. No left hilar lymphadenopathy. Lungs/Pleura: Paraseptal emphysema in both upper lobes with some progression since 2010. In the superior segment of the right lower lobe there is a spiculated approximately 29 x 40 x 35 millimeter mass/density corresponding to the radiographic abnormality this morning. This is in proximity to the posterior right hilum. No other acute pulmonary opacity to suggest infection. Major airways remain patent. There is chronic posterior/dependent lower lobe scarring and atelectasis which is greater on the right. No  pleural effusion. Upper Abdomen: Chronic hepatic steatosis. Otherwise negative visible liver, gallbladder, spleen, pancreas, adrenal glands and bowel. Exophytic low-density probable simple cyst of the left kidney on series 3, image 157. Musculoskeletal: No acute or suspicious osseous lesion identified. IMPRESSION: 1. Spiculated 4 cm superior segment right lower lobe lung mass corresponding to the radiographic abnormality is highly suspicious for Bronchogenic Carcinoma, with associated Right hilar and subcarinal lymphadenopathy. 2.  Emphysema (ICD10-J43.9). 3. Calcified coronary artery atherosclerosis. Evidence of chronic fatty liver disease. Electronically Signed   By: Genevie Ann M.D.   On: 07/05/2018 09:23   Ct Angio Chest Pe W And/or Wo Contrast  Result Date: 07/20/2018 CLINICAL DATA:  Pt reports RUQ pain that radiates around to his R back. Sxs onset was about a month and half ago, worsened 2 weeks ago so he went to [facility] ED. They found a mass in his R lung. Had an MRI and PET scan today. Pt reports pain is severe today. He has taken a Tramadol without relief. He denies any cough or fever/chills. EXAM: CT ANGIOGRAPHY CHEST WITH CONTRAST TECHNIQUE: Multidetector CT imaging of the chest was performed using the standard protocol during bolus administration of intravenous contrast. Multiplanar CT image reconstructions and MIPs were obtained to evaluate the vascular anatomy. CONTRAST:  168mL OMNIPAQUE IOHEXOL 350 MG/ML SOLN COMPARISON:  PET-CT, 07/20/2018.  Chest CT 07/05/2018. FINDINGS: Cardiovascular: There is satisfactory opacification of the pulmonary arteries to the segmental level. No evidence of a pulmonary embolism. Heart is top-normal in size. Three-vessel coronary artery calcifications. No pericardial effusion. Great vessels normal caliber. Mild aortic atherosclerosis. No aortic dissection. Mediastinum/Nodes: Prominent right hilar and subcarinal nodes, hypermetabolic on PET-CT consistent with metastatic  disease. No other mediastinal adenopathy. No left hilar adenopathy. No mediastinal masses. No neck base or axillary masses or adenopathy. Trachea and esophagus are unremarkable. Lungs/Pleura: Mass, posterior aspect of the right lobe superior segment, measuring 3.4 x 2.1 x 3.3 cm on the current study, without change from the prior exams allowing for differences in measurement technique. Dependent linear and reticular opacities in the lower lobes consistent with subsegmental atelectasis. Centrilobular and paraseptal emphysema in the upper lobes. No evidence of pneumonia or pulmonary edema. No pleural effusion or pneumothorax. Upper Abdomen: No acute findings. Musculoskeletal: Destructive bone lesion, posterolateral right eighth rib consistent with metastatic disease as noted on the current PET-CT. No other bone lesions. Review of the MIP images confirms the above findings. IMPRESSION: 1. No evidence of a pulmonary embolism. 2. Right lung mass consistent with bronchogenic carcinoma with right hilar and subcarinal metastatic adenopathy. 3. Destructive metastatic lesion in the posterolateral right eighth rib. This may be the source of the patient's back pain. 4. Chronic findings of centrilobular and paraseptal emphysema, coronary artery calcifications and mild aortic atherosclerosis. Aortic Atherosclerosis (ICD10-I70.0) and Emphysema (ICD10-J43.9). Electronically Signed   By: Lajean Manes M.D.   On: 07/20/2018 23:27   Mr Jeri Cos UE Contrast  Result Date: 07/20/2018 CLINICAL DATA:  Lung cancer staging EXAM: MRI HEAD WITHOUT AND WITH CONTRAST TECHNIQUE: Multiplanar, multiecho pulse sequences of the brain and surrounding structures were obtained without and with intravenous contrast. CONTRAST:  8 mL Gadovist IV COMPARISON:  CT head 12/30/2005 FINDINGS: Brain: Ventricle size and cerebral volume normal. Minimal chronic changes in the periventricular white matter. No acute infarct. Negative for hemorrhage mass or edema.  No enhancing metastatic deposits are identified. Vascular: Normal arterial flow voids Skull and upper cervical spine: Negative Sinuses/Orbits: Paranasal sinuses clear. Small right mastoid effusion. Normal orbit. Other: None IMPRESSION: Negative for metastatic disease to the brain. No acute abnormality. Minimal chronic white matter changes. Electronically Signed   By: Franchot Gallo M.D.   On: 07/20/2018 13:40   Nm Pet Image Initial (pi) Skull Base To Thigh  Result Date: 07/20/2018 CLINICAL DATA:  Initial treatment strategy for non-small-cell lung cancer. EXAM: NUCLEAR MEDICINE PET SKULL BASE TO THIGH TECHNIQUE: 10.2 mCi F-18 FDG was injected intravenously. Full-ring PET imaging was performed from the skull base to thigh after the radiotracer. CT data was obtained and used for attenuation correction and anatomic localization. Fasting blood glucose: 130 mg/dl COMPARISON:  Chest CT 07/05/2018 FINDINGS: Mediastinal blood pool activity: SUV max 3.3 NECK: No hypermetabolic lymph nodes in the neck. Incidental CT findings: none CHEST: The posterior right lower lobe mass is markedly hypermetabolic with SUV max = 9.6. Hypermetabolic metastases to the right hilum demonstrates SUV max = 8.8. Hypermetabolic subcarinal nodal metastases demonstrate SUV max = 10.8. Hypermetabolic small precarinal lymph node associated. Incidental CT findings: Atherosclerotic calcification is noted in the wall of the thoracic aorta. Coronary artery calcification is evident. Centrilobular and paraseptal emphysema noted in the lungs bilaterally. ABDOMEN/PELVIS: No abnormal hypermetabolic activity within the liver, pancreas, adrenal glands, or spleen. No hypermetabolic lymph nodes in the abdomen or pelvis. Small focus of FDG uptake in the posterior stomach likely physiologic. Incidental CT findings: none SKELETON: 2.6 x 1.1 cm lesion in the posterior right eighth rib (83/4) is markedly hypermetabolic with SUV max = 8.6. Low level uptake in the  right SI joint is probably degenerative. No other suspicious hypermetabolic bony disease evident. Incidental CT findings: none IMPRESSION: 1. Right lower lobe pulmonary mass is markedly hypermetabolic consistent with history of non-small-cell lung cancer. Hypermetabolic metastatic disease is seen in the right hilum and mediastinum. 2. Hypermetabolic posterior right eighth rib lesion consistent with bony metastatic involvement. No additional hypermetabolic bone metastases evident. 3.  Aortic Atherosclerois (ICD10-170.0) 4.  Emphysema. ([id number]) Electronically Signed   By: Misty Stanley M.D.   On: 07/20/2018 17:02    ASSESSMENT AND PLAN: This is a very pleasant 68 years old African-American male recently diagnosed with stage IV (T2b, N2, M1 C) lung cancer pending tissue diagnosis presented with right lower lobe lung mass in addition to mediastinal lymphadenopathy and metastatic disease to the right eighth rib. I had a lengthy discussion with the patient and his family about his current disease stage, prognosis and treatment options. I recommended for the patient to have CT-guided core biopsy of the right lower lobe lung mass for tissue diagnosis and molecular studies if needed. I would not recommend proceeding with biopsy of the right eighth rib because of requirement for decalcification and loss of the ability to do any molecular testing on the specimen. For the right-sided chest pain, I will refer the patient to Dr. Sondra Come for consideration of palliative radiotherapy to this area. Once the tissue diagnosis is confirmed, I will  discuss with the patient his treatment options including a course of concurrent chemoradiation for the locally advanced disease in the lung and the mediastinum versus consideration of systemic chemotherapy for his metastatic disease. The patient will come back for follow-up visit in less than 2 weeks for further evaluation and more discussion of his treatment options after the  biopsy results. He was advised to call immediately if he has any concerning symptoms in the interval. The patient voices understanding of current disease status and treatment options and is in agreement with the current care plan.  All questions were answered. The patient knows to call the clinic with any problems, questions or concerns. We can certainly see the patient much sooner if necessary.  I spent 15 minutes counseling the patient face to face. The total time spent in the appointment was 25 minutes.  Disclaimer: This note was dictated with voice recognition software. Similar sounding words can inadvertently be transcribed and may not be corrected upon review.

## 2018-07-26 ENCOUNTER — Telehealth: Payer: Self-pay | Admitting: Medical Oncology

## 2018-07-26 ENCOUNTER — Ambulatory Visit
Admission: RE | Admit: 2018-07-26 | Discharge: 2018-07-26 | Disposition: A | Discharge status: Home / Self Care | Payer: Medicare Other | Source: Ambulatory Visit | Attending: Radiation Oncology | Admitting: Radiation Oncology

## 2018-07-26 ENCOUNTER — Encounter: Payer: Self-pay | Admitting: Radiation Oncology

## 2018-07-26 ENCOUNTER — Other Ambulatory Visit: Payer: Self-pay

## 2018-07-26 DIAGNOSIS — Z7982 Long term (current) use of aspirin: Secondary | ICD-10-CM | POA: Diagnosis not present

## 2018-07-26 DIAGNOSIS — M899 Disorder of bone, unspecified: Secondary | ICD-10-CM | POA: Diagnosis not present

## 2018-07-26 DIAGNOSIS — I2510 Atherosclerotic heart disease of native coronary artery without angina pectoris: Secondary | ICD-10-CM | POA: Diagnosis not present

## 2018-07-26 DIAGNOSIS — J439 Emphysema, unspecified: Secondary | ICD-10-CM | POA: Insufficient documentation

## 2018-07-26 DIAGNOSIS — Z79899 Other long term (current) drug therapy: Secondary | ICD-10-CM | POA: Insufficient documentation

## 2018-07-26 DIAGNOSIS — C7951 Secondary malignant neoplasm of bone: Secondary | ICD-10-CM | POA: Insufficient documentation

## 2018-07-26 DIAGNOSIS — Z923 Personal history of irradiation: Secondary | ICD-10-CM | POA: Diagnosis not present

## 2018-07-26 DIAGNOSIS — R918 Other nonspecific abnormal finding of lung field: Secondary | ICD-10-CM | POA: Insufficient documentation

## 2018-07-26 DIAGNOSIS — C3431 Malignant neoplasm of lower lobe, right bronchus or lung: Secondary | ICD-10-CM

## 2018-07-26 DIAGNOSIS — Z87891 Personal history of nicotine dependence: Secondary | ICD-10-CM | POA: Diagnosis not present

## 2018-07-26 DIAGNOSIS — Z7984 Long term (current) use of oral hypoglycemic drugs: Secondary | ICD-10-CM | POA: Diagnosis not present

## 2018-07-26 DIAGNOSIS — E119 Type 2 diabetes mellitus without complications: Secondary | ICD-10-CM | POA: Insufficient documentation

## 2018-07-26 DIAGNOSIS — Z8546 Personal history of malignant neoplasm of prostate: Secondary | ICD-10-CM | POA: Insufficient documentation

## 2018-07-26 DIAGNOSIS — K760 Fatty (change of) liver, not elsewhere classified: Secondary | ICD-10-CM | POA: Diagnosis not present

## 2018-07-26 NOTE — Patient Instructions (Signed)
Coronavirus (COVID-19) Are you at risk?  Are you at risk for the Coronavirus (COVID-19)?  To be considered HIGH RISK for Coronavirus (COVID-19), you have to meet the following criteria:  . Traveled to [place], [place], [place], [place] or [place]; or in the [place] to [place], [place], [place], or [place]; and have fever, cough, and shortness of breath within the last 2 weeks of travel OR . Been in close contact with a person diagnosed with COVID-19 within the last 2 weeks and have fever, cough, and shortness of breath . IF YOU DO NOT MEET THESE CRITERIA, YOU ARE CONSIDERED LOW RISK FOR COVID-19.  What to do if you are HIGH RISK for COVID-19?  . If you are having a medical emergency, call 911. . Seek medical care right away. Before you go to a doctor's office, urgent care or emergency department, call ahead and tell them about your recent travel, contact with someone diagnosed with COVID-19, and your symptoms. You should receive instructions from your physician's office regarding next steps of care.  . When you arrive at healthcare provider, tell the healthcare staff immediately you have returned from visiting [place], [place], [place], [place] or [place]; or traveled in the [place] to [place], [place], [place], or [place]; in the last two weeks or you have been in close contact with a person diagnosed with COVID-19 in the last 2 weeks.   . Tell the health care staff about your symptoms: fever, cough and shortness of breath. . After you have been seen by a medical provider, you will be either: o Tested for (COVID-19) and discharged home on quarantine except to seek medical care if symptoms worsen, and asked to  - Stay home and avoid contact with others until you get your results (4-5 days)  - Avoid travel on public transportation if possible (such as bus, train, or airplane) or o Sent to the Emergency Department by EMS for evaluation, COVID-19 testing, and possible  admission depending on your condition and test results.  What to do if you are LOW RISK for COVID-19?  Reduce your risk of any infection by using the same precautions used for avoiding the common cold or flu:  . Wash your hands often with soap and warm water for at least 20 seconds.  If soap and water are not readily available, use an alcohol-based hand sanitizer with at least 60% alcohol.  . If coughing or sneezing, cover your mouth and nose by coughing or sneezing into the elbow areas of your shirt or coat, into a tissue or into your sleeve (not your hands). . Avoid shaking hands with others and consider head nods or verbal greetings only. . Avoid touching your eyes, nose, or mouth with unwashed hands.  . Avoid close contact with people who are sick. . Avoid places or events with large numbers of people in one location, like concerts or sporting events. . Carefully consider travel plans you have or are making. . If you are planning any travel outside or inside the [place], visit the CDC's Travelers' Health webpage for the latest health notices. . If you have some symptoms but not all symptoms, continue to monitor at home and seek medical attention if your symptoms worsen. . If you are having a medical emergency, call 911.   ADDITIONAL HEALTHCARE OPTIONS FOR PATIENTS  [facility] / e-Visit: [email or web address]         [facility]: [phone]  [facility] [facility]: [phone]                   [facility]: [phone]   

## 2018-07-26 NOTE — Progress Notes (Signed)
Radiation Oncology         [phone] ________________________________  Initial Outpatient Consultation  Name: Zachary Knox MRN: [id number]  Date: 07/26/2018  DOB: [birth year]  KW:[id number], Iona Beard, MD  Curt Bears, MD   REFERRING PHYSICIAN: Curt Bears, MD  DIAGNOSIS: Presumptive stageIV(T2b, N2, M1c) lung cancer, presented with right lower lobe lung mass as well as right hilar and mediastinal lymphadenopathy as well as bone metastasis in the right eighth rib  HISTORY OF PRESENT ILLNESS::Zachary Knox is a 68 y.o. male who is presenting to the office today for evaluation of presumptive lung cancer. He presented to the ED on 07/05/18 for right sided upper abd pain and right chest pain beginning 2 weeks prior. Following a CT scan performed during this visit, the patient was referred to outpatient follow-up for discussion of treatment options. The patient saw Dr. Julien Nordmann on 07/12/18 who ordered PET scan as well as MRI of the brain to rule out brain metastasis. He again presented to the ED 07/20/18 for right sided chest pain and was discharged with pain medication. He is here today to discuss radiation treatment options.  He underwent an X-ray while in the ED on 07/05/18 which showed enlargement of cardiac silhouette, as well as abnormal density projecting over the midthoracic spine on lateral view question infiltrate versus atelectasis or mass; further evaluation by CT chest with contrast was recommended. Accordingly, he underwent chest CT with contrast the same day which revealed a spiculated 4 cm superior segment right lower lobe lung mass corresponding to the radiographic abnormality is highly suspicious for Bronchogenic Carcinoma, with associated right hilar and subcarinal lymphadenopathy. Emphysema (ICD10-J43.9) and calcified coronary artery atherosclerosis as well as evidence of chronic fatty liver disease were noted.  His brain MRI on 07/20/18 was negative for metastatic  disease.   PET scan on 07/20/18 showed a right lower lobe pulmonary mass is markedly hypermetabolic consistent with history of non-small-cell lung cancer. Hypermetabolic metastatic disease is seen in the right hilum and Mediastinum. Hypermetabolic posterior right eighth rib lesion consistent with bony metastatic involvement. No additional hypermetabolic bone metastases evident.  he reports associated pain along the right lower lateral chest area. he denies any other symptoms.    PREVIOUS RADIATION THERAPY: Yes, prostate, 2004   PAST MEDICAL HISTORY:  has a past medical history of Diabetes mellitus without complication ([place]).    PAST SURGICAL HISTORY: Past Surgical History:  Procedure Laterality Date   CARDIAC SURGERY     Heart attack in 2008 with stent placement   stent in heart      FAMILY HISTORY: family history is not on file.  SOCIAL HISTORY:  reports that he has quit smoking. He has never used smokeless tobacco. He reports current alcohol use.  ALLERGIES: Patient has no known allergies.  MEDICATIONS:  Current Outpatient Medications  Medication Sig Dispense Refill   acetaminophen (TYLENOL) 500 MG tablet Take 1,000 mg by mouth every 6 (six) hours as needed for mild pain.      amLODipine (NORVASC) 5 MG tablet Take 5 mg by mouth daily.     aspirin EC 81 MG tablet Take 81 mg by mouth daily.      carvedilol (COREG) 6.25 MG tablet Take 6.25 mg by mouth 2 (two) times daily.     diphenhydrAMINE (BENADRYL) 25 MG tablet Take 1 tablet (25 mg total) by mouth every 6 (six) hours. 20 tablet 0   famotidine (PEPCID) 20 MG tablet Take 1 tablet (20 mg total) by mouth 2 (  two) times daily. 10 tablet 0   gabapentin (NEURONTIN) 300 MG capsule Take 300 mg by mouth at bedtime.      levothyroxine (SYNTHROID, LEVOTHROID) 100 MCG tablet Take 100 mcg by mouth daily.     metFORMIN (GLUCOPHAGE) 1000 MG tablet Take 500-1,000 mg by mouth See admin instructions. Take 1000mg  in the am and 500mg  in  the pm.     metoprolol succinate (TOPROL-XL) 50 MG 24 hr tablet Take 100 mg by mouth daily. Take with or immediately following a meal.     oxyCODONE-acetaminophen (PERCOCET/ROXICET) 5-325 MG tablet Take 1 tablet by mouth every 4 (four) hours as needed for severe pain. 30 tablet 0   pantoprazole (PROTONIX) 40 MG tablet Take 40 mg by mouth 2 (two) times daily.     ramipril (ALTACE) 10 MG capsule Take 10 mg by mouth daily.     TOUJEO SOLOSTAR 300 UNIT/ML SOPN Inject 18 Units into the skin every morning.     traMADol (ULTRAM) 50 MG tablet Take 1 tablet (50 mg total) by mouth every 6 (six) hours as needed. (Patient taking differently: Take 50 mg by mouth every 6 (six) hours as needed for moderate pain. ) 20 tablet 0   No current facility-administered medications for this encounter.     REVIEW OF SYSTEMS:  A 10+ POINT REVIEW OF SYSTEMS WAS OBTAINED including neurology, dermatology, psychiatry, cardiac, respiratory, lymph, extremities, GI, GU, musculoskeletal, constitutional, reproductive, HEENT. All pertinent positives are noted in the HPI. All others are negative.    PHYSICAL EXAM:  height is 5\' 9"  (1.753 m) and weight is 180 lb 3.2 oz (81.7 kg). His oral temperature is 98.2 F (36.8 C). His blood pressure is 106/92 (abnormal) and his pulse is 77. His respiration is 20 and oxygen saturation is 100%.   General: Alert and oriented, in no acute distress HEENT: Head is normocephalic. Extraocular movements are intact. Oropharynx is clear. Dentures in place Neck: Neck is supple, no palpable cervical or supraclavicular lymphadenopathy. Heart: Regular in rate and rhythm with no murmurs, rubs, or gallops. Chest: Clear to auscultation bilaterally, with no rhonchi, wheezes, or rales. Abdomen: Soft, nontender, nondistended, with no rigidity or guarding. Extremities: No cyanosis or edema. Lymphatics: see Neck Exam Skin: No concerning lesions. Musculoskeletal: symmetric strength and muscle tone  throughout. Neurologic: Cranial nerves II through XII are grossly intact. No obvious focalities. Speech is fluent. Coordination is intact. Psychiatric: Judgment and insight are intact. Affect is appropriate.   ECOG = 1  0 - Asymptomatic (Fully active, able to carry on all predisease activities without restriction)  1 - Symptomatic but completely ambulatory (Restricted in physically strenuous activity but ambulatory and able to carry out work of a light or sedentary nature. For example, light housework, office work)  2 - Symptomatic, <50% in bed during the day (Ambulatory and capable of all self care but unable to carry out any work activities. Up and about more than 50% of waking hours)  3 - Symptomatic, >50% in bed, but not bedbound (Capable of only limited self-care, confined to bed or chair 50% or more of waking hours)  4 - Bedbound (Completely disabled. Cannot carry on any self-care. Totally confined to bed or chair)  5 - Death   Eustace Pen MM, Creech RH, Tormey DC, et al. [phone]). "Toxicity and response criteria of the [facility]". [place] Oncol. 5 (6): 649-55  LABORATORY DATA:  Lab Results  Component Value Date   WBC 8.1 07/20/2018  HGB 13.8 07/20/2018   HCT 40.7 07/20/2018   MCV 94.7 07/20/2018   PLT 232 07/20/2018   NEUTROABS 4.5 07/12/2018   Lab Results  Component Value Date   NA 136 07/20/2018   K 4.4 07/20/2018   CL 106 07/20/2018   CO2 20 (L) 07/20/2018   GLUCOSE 139 (H) 07/20/2018   CREATININE 0.84 07/20/2018   CALCIUM 9.0 07/20/2018      RADIOGRAPHY: Dg Chest 2 View  Result Date: 07/05/2018 CLINICAL DATA:  RIGHT-side abdominal pain for 2 weeks EXAM: CHEST - 2 VIEW COMPARISON:  02/15/2016 FINDINGS: Enlargement of cardiac silhouette. Mediastinal contours and pulmonary vascularity normal. Peribronchial thickening, chronic. On lateral view, abnormal density projects over the posterior midthoracic spine, poorly localized on AP view. This  could represent subtle infiltrate or atelectasis but mass is not excluded. Remaining lungs clear. No pleural effusion or pneumothorax. No acute osseous findings. IMPRESSION: Enlargement of cardiac silhouette. Abnormal density projecting over the midthoracic spine on lateral view question infiltrate versus atelectasis or mass; further evaluation by CT chest with contrast recommended. Electronically Signed   By: Lavonia Dana M.D.   On: 07/05/2018 08:13   Ct Chest W Contrast  Result Date: 07/05/2018 CLINICAL DATA:  68 year old male with right side chest pain and abnormal right lung density on radiographs today. EXAM: CT CHEST WITH CONTRAST TECHNIQUE: Multidetector CT imaging of the chest was performed during intravenous contrast administration. CONTRAST:  53mL OMNIPAQUE IOHEXOL 300 MG/ML  SOLN COMPARISON:  Chest radiographs 0804 hours today. CTA chest 12/16/2008. FINDINGS: Cardiovascular: No cardiomegaly or pericardial effusion. Calcified coronary artery atherosclerosis and/or stents (series 3, image 80). No significant atherosclerosis of the visible aorta. The left vertebral artery arises very near the arch (normal variant). Mediastinum/Nodes: There is new right hilar lymphadenopathy since the 2010 CT with nodes ranging from 10-17 millimeters short axis (series 3, images 64 and 69. Subcarinal nodal tissue has also increased up to 13 millimeters short axis. Anterior carina node measuring 9 millimeter short axis is larger but remains normal by size criteria. No left hilar lymphadenopathy. Lungs/Pleura: Paraseptal emphysema in both upper lobes with some progression since 2010. In the superior segment of the right lower lobe there is a spiculated approximately 29 x 40 x 35 millimeter mass/density corresponding to the radiographic abnormality this morning. This is in proximity to the posterior right hilum. No other acute pulmonary opacity to suggest infection. Major airways remain patent. There is chronic  posterior/dependent lower lobe scarring and atelectasis which is greater on the right. No pleural effusion. Upper Abdomen: Chronic hepatic steatosis. Otherwise negative visible liver, gallbladder, spleen, pancreas, adrenal glands and bowel. Exophytic low-density probable simple cyst of the left kidney on series 3, image 157. Musculoskeletal: No acute or suspicious osseous lesion identified. IMPRESSION: 1. Spiculated 4 cm superior segment right lower lobe lung mass corresponding to the radiographic abnormality is highly suspicious for Bronchogenic Carcinoma, with associated Right hilar and subcarinal lymphadenopathy. 2.  Emphysema (ICD10-J43.9). 3. Calcified coronary artery atherosclerosis. Evidence of chronic fatty liver disease. Electronically Signed   By: Genevie Ann M.D.   On: 07/05/2018 09:23   Ct Angio Chest Pe W And/or Wo Contrast  Result Date: 07/20/2018 CLINICAL DATA:  Pt reports RUQ pain that radiates around to his R back. Sxs onset was about a month and half ago, worsened 2 weeks ago so he went to [facility] ED. They found a mass in his R lung. Had an MRI and PET scan today. Pt reports pain is severe today. He  has taken a Tramadol without relief. He denies any cough or fever/chills. EXAM: CT ANGIOGRAPHY CHEST WITH CONTRAST TECHNIQUE: Multidetector CT imaging of the chest was performed using the standard protocol during bolus administration of intravenous contrast. Multiplanar CT image reconstructions and MIPs were obtained to evaluate the vascular anatomy. CONTRAST:  12mL OMNIPAQUE IOHEXOL 350 MG/ML SOLN COMPARISON:  PET-CT, 07/20/2018.  Chest CT 07/05/2018. FINDINGS: Cardiovascular: There is satisfactory opacification of the pulmonary arteries to the segmental level. No evidence of a pulmonary embolism. Heart is top-normal in size. Three-vessel coronary artery calcifications. No pericardial effusion. Great vessels normal caliber. Mild aortic atherosclerosis. No aortic dissection. Mediastinum/Nodes: Prominent  right hilar and subcarinal nodes, hypermetabolic on PET-CT consistent with metastatic disease. No other mediastinal adenopathy. No left hilar adenopathy. No mediastinal masses. No neck base or axillary masses or adenopathy. Trachea and esophagus are unremarkable. Lungs/Pleura: Mass, posterior aspect of the right lobe superior segment, measuring 3.4 x 2.1 x 3.3 cm on the current study, without change from the prior exams allowing for differences in measurement technique. Dependent linear and reticular opacities in the lower lobes consistent with subsegmental atelectasis. Centrilobular and paraseptal emphysema in the upper lobes. No evidence of pneumonia or pulmonary edema. No pleural effusion or pneumothorax. Upper Abdomen: No acute findings. Musculoskeletal: Destructive bone lesion, posterolateral right eighth rib consistent with metastatic disease as noted on the current PET-CT. No other bone lesions. Review of the MIP images confirms the above findings. IMPRESSION: 1. No evidence of a pulmonary embolism. 2. Right lung mass consistent with bronchogenic carcinoma with right hilar and subcarinal metastatic adenopathy. 3. Destructive metastatic lesion in the posterolateral right eighth rib. This may be the source of the patient's back pain. 4. Chronic findings of centrilobular and paraseptal emphysema, coronary artery calcifications and mild aortic atherosclerosis. Aortic Atherosclerosis (ICD10-I70.0) and Emphysema (ICD10-J43.9). Electronically Signed   By: Lajean Manes M.D.   On: 07/20/2018 23:27   Mr Jeri Cos MG Contrast  Result Date: 07/20/2018 CLINICAL DATA:  Lung cancer staging EXAM: MRI HEAD WITHOUT AND WITH CONTRAST TECHNIQUE: Multiplanar, multiecho pulse sequences of the brain and surrounding structures were obtained without and with intravenous contrast. CONTRAST:  8 mL Gadovist IV COMPARISON:  CT head 12/30/2005 FINDINGS: Brain: Ventricle size and cerebral volume normal. Minimal chronic changes in the  periventricular white matter. No acute infarct. Negative for hemorrhage mass or edema. No enhancing metastatic deposits are identified. Vascular: Normal arterial flow voids Skull and upper cervical spine: Negative Sinuses/Orbits: Paranasal sinuses clear. Small right mastoid effusion. Normal orbit. Other: None IMPRESSION: Negative for metastatic disease to the brain. No acute abnormality. Minimal chronic white matter changes. Electronically Signed   By: Franchot Gallo M.D.   On: 07/20/2018 13:40   Nm Pet Image Initial (pi) Skull Base To Thigh  Result Date: 07/20/2018 CLINICAL DATA:  Initial treatment strategy for non-small-cell lung cancer. EXAM: NUCLEAR MEDICINE PET SKULL BASE TO THIGH TECHNIQUE: 10.2 mCi F-18 FDG was injected intravenously. Full-ring PET imaging was performed from the skull base to thigh after the radiotracer. CT data was obtained and used for attenuation correction and anatomic localization. Fasting blood glucose: 130 mg/dl COMPARISON:  Chest CT 07/05/2018 FINDINGS: Mediastinal blood pool activity: SUV max 3.3 NECK: No hypermetabolic lymph nodes in the neck. Incidental CT findings: none CHEST: The posterior right lower lobe mass is markedly hypermetabolic with SUV max = 9.6. Hypermetabolic metastases to the right hilum demonstrates SUV max = 8.8. Hypermetabolic subcarinal nodal metastases demonstrate SUV max = 10.8. Hypermetabolic small precarinal  lymph node associated. Incidental CT findings: Atherosclerotic calcification is noted in the wall of the thoracic aorta. Coronary artery calcification is evident. Centrilobular and paraseptal emphysema noted in the lungs bilaterally. ABDOMEN/PELVIS: No abnormal hypermetabolic activity within the liver, pancreas, adrenal glands, or spleen. No hypermetabolic lymph nodes in the abdomen or pelvis. Small focus of FDG uptake in the posterior stomach likely physiologic. Incidental CT findings: none SKELETON: 2.6 x 1.1 cm lesion in the posterior right eighth  rib (83/4) is markedly hypermetabolic with SUV max = 8.6. Low level uptake in the right SI joint is probably degenerative. No other suspicious hypermetabolic bony disease evident. Incidental CT findings: none IMPRESSION: 1. Right lower lobe pulmonary mass is markedly hypermetabolic consistent with history of non-small-cell lung cancer. Hypermetabolic metastatic disease is seen in the right hilum and mediastinum. 2. Hypermetabolic posterior right eighth rib lesion consistent with bony metastatic involvement. No additional hypermetabolic bone metastases evident. 3.  Aortic Atherosclerois (ICD10-170.0) 4.  Emphysema. ([id number]-Q00.9) Electronically Signed   By: Misty Stanley M.D.   On: 07/20/2018 17:02      IMPRESSION:  stageIV(T2b, N2, M1c) lung cancer, pending tissue diagnosispresented with right lower lobe lung mass as well as right hilar and mediastinal lymphadenopathy as well as bone metastasis in the right eighth rib.  I carefully reviewed the patient's CT and PET images and the lesion along the right eighth rib is in close proximity to his primary lesion. The patient appears to have oligometastasis and feel, given his excellent performance status, that he would be a candidate for a definitve course of treatment, including 6 weeks of radiation therapy along with radiosensitizing chemotherapy. Radiation treatments.   would encompass all known disease within the chest and right eighth rib. I have discussed this with Dr. Julien Nordmann who is in agreement. Pt will proceed with biopsy to confirm diagnosis. Also, he will have a port placed in preparation for chemotherapy. Pt is well-aware of radiation (previous radiation with prostate cancer).   Today, I talked to the patient about the findings and work-up thus far.  We discussed the natural history of lung cancer and general treatment, highlighting the role of radiotherapy in the management.  We discussed the available radiation techniques, and focused on the details  of logistics and delivery.  We reviewed the anticipated acute and late sequelae associated with radiation in this setting.  The patient was encouraged to ask questions that I answered to the best of my ability.  A patient consent form was discussed and signed.  We retained a copy for our records.  The patient would like to proceed with radiation and will be scheduled for CT simulation.   PLAN: He will return on Wednesday at 9 AM for CT simulation. Treatments to begin approximately a week later depending on when  Biopsy is scheduled .   ------------------------------------------------  Blair Promise, PhD, MD      This document serves as a record of services personally performed by Gery Pray, MD. It was created on his behalf by Mary-Margaret Loma Messing, a trained medical scribe. The creation of this record is based on the scribe's personal observations and the provider's statements to them. This document has been checked and approved by the attending provider.

## 2018-07-26 NOTE — Progress Notes (Signed)
Thoracic Location of Tumor / Histology: DIAGNOSIS: suspicious stage IV (T2b, N2, M1c) lung cancer, pending tissue diagnosis presented with right lower lobe lung mass as well as right hilar and mediastinal lymphadenopathy as well as bone metastasis in the right eighth rib.  Patient presented with symptoms of: The patient mentioned that he has been complaining of pain on the right lower side of the chest and right upper quadrant of the abdomen for over a month.  He also has mild cough.  The patient presented to the emergency department for evaluation.  CT scan of the chest with contrast performed on July 05, 2018 was performed and it showed spiculated approximately 2.9 x 4.0 x 3.5 cm mass/density corresponding to the radiographic abnormality seen on previous chest x-ray and proximity to the posterior right hilum.  The scan also showed new right hilar lymphadenopathy as well as subcarinal nodal tissue increased up to 1.3 cm as well as anterior carinal node measuring 0.9 cm in short axis.  Biopsies revealed: CT guided biopsy ordered per Dr. Earlie Server.  Tobacco/Marijuana/Snuff/ETOH use: He has a history of smoking 1 pack/day for around 35 years and quit in 2009.  He also drinks a lot of beer and alcohol on the weekend.  He has no history of drug abuse.  Past/Anticipated interventions by cardiothoracic surgery, if any: None at this time.  Past/Anticipated interventions by medical oncology, if any: Per Dr. Earlie Server 07/23/18:  ASSESSMENT AND PLAN: This is a very pleasant 68 years old African-American male recently diagnosed with stage IV (T2b, N2, M1 C) lung cancer pending tissue diagnosis presented with right lower lobe lung mass in addition to mediastinal lymphadenopathy and metastatic disease to the right eighth rib. I had a lengthy discussion with the patient and his family about his current disease stage, prognosis and treatment options. I recommended for the patient to have CT-guided core biopsy of the right  lower lobe lung mass for tissue diagnosis and molecular studies if needed. I would not recommend proceeding with biopsy of the right eighth rib because of requirement for decalcification and loss of the ability to do any molecular testing on the specimen. For the right-sided chest pain, I will refer the patient to Dr. Sondra Come for consideration of palliative radiotherapy to this area. Once the tissue diagnosis is confirmed, I will discuss with the patient his treatment options including a course of concurrent chemoradiation for the locally advanced disease in the lung and the mediastinum versus consideration of systemic chemotherapy for his metastatic disease. The patient will come back for follow-up visit in less than 2 weeks for further evaluation and more discussion of his treatment options after the biopsy results. He was advised to call immediately if he has any concerning symptoms in the interval. The patient voices understanding of current disease status and treatment options and is in agreement with the current care plan.  Signs/Symptoms  Weight changes, if any:  He denied having any weight loss or night sweats.  Respiratory complaints, if any:  He also has mild cough with no hemoptysis.  He denied having any shortness of breath.  Hemoptysis, if any: Denies  Pain issues, if any:  Pt reports pain, rated 6/10 to the RIGHT side/flank.   SAFETY ISSUES:  Prior radiation? Hx of prostate CA in 2004  Pacemaker/ICD? No  Possible current pregnancy? N/A, pt is male  Is the patient on methotrexate? No  Current Complaints / other details:  Pt presents today for initial consult with Dr. Sondra Come for Radiation  Oncology. Pt has wife and daughter on speaker phone.  BP (!) 106/92 (BP Location: Left Arm, Patient Position: Sitting)   Pulse 77   Temp 98.2 F (36.8 C) (Oral)   Resp 20   Ht 5\' 9"  (1.753 m)   Wt 180 lb 3.2 oz (81.7 kg)   SpO2 100%   BMI 26.61 kg/m   Wt Readings from Last 3  Encounters:  07/26/18 180 lb 3.2 oz (81.7 kg)  07/23/18 182 lb 8 oz (82.8 kg)  07/20/18 180 lb (81.6 kg)    Loma Sousa, RN BSN

## 2018-07-26 NOTE — Telephone Encounter (Signed)
BX is scheduled for a week after visit. Message sent to scheduling to r/s after bx.

## 2018-07-28 ENCOUNTER — Other Ambulatory Visit: Payer: Self-pay

## 2018-07-28 ENCOUNTER — Encounter: Payer: Self-pay | Admitting: *Deleted

## 2018-07-28 ENCOUNTER — Ambulatory Visit
Admission: RE | Admit: 2018-07-28 | Discharge: 2018-07-28 | Disposition: A | Discharge status: Home / Self Care | Payer: Medicare Other | Source: Ambulatory Visit | Attending: Radiation Oncology | Admitting: Radiation Oncology

## 2018-07-28 ENCOUNTER — Other Ambulatory Visit: Payer: Self-pay | Admitting: Radiation Oncology

## 2018-07-28 DIAGNOSIS — Z510 Encounter for antineoplastic radiation therapy: Secondary | ICD-10-CM | POA: Insufficient documentation

## 2018-07-28 DIAGNOSIS — C7951 Secondary malignant neoplasm of bone: Secondary | ICD-10-CM | POA: Diagnosis not present

## 2018-07-28 DIAGNOSIS — C801 Malignant (primary) neoplasm, unspecified: Secondary | ICD-10-CM | POA: Diagnosis not present

## 2018-07-28 DIAGNOSIS — R590 Localized enlarged lymph nodes: Secondary | ICD-10-CM | POA: Diagnosis not present

## 2018-07-28 DIAGNOSIS — R918 Other nonspecific abnormal finding of lung field: Secondary | ICD-10-CM | POA: Diagnosis not present

## 2018-07-28 MED ORDER — OXYCODONE-ACETAMINOPHEN 5-325 MG PO TABS: 1.0000 | ORAL_TABLET | ORAL | 0 refills | Status: DC | PRN

## 2018-07-28 NOTE — Addendum Note (Signed)
Encounter addended by: Gery Pray, MD on: 07/28/2018 6:29 PM  Actions taken: Pharmacy for encounter modified

## 2018-07-28 NOTE — Progress Notes (Signed)
  Radiation Oncology         (336) [phone] ________________________________  Name: Zachary Knox MRN: [id number]  Date: 07/28/2018  DOB: 1950-08-02  SIMULATION AND TREATMENT PLANNING NOTE    ICD-10-CM   1. 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  Identity was confirmed.  All relevant records and images related to the planned course of therapy were reviewed.  The patient freely provided informed written consent to proceed with treatment after reviewing the details related to the planned course of therapy. The consent form was witnessed and verified by the simulation staff.  Then, the patient was set-up in a stable reproducible  supine position for radiation therapy.  CT images were obtained.  Surface markings were placed.  The CT images were loaded into the planning software.  Then the target and avoidance structures were contoured.  Treatment planning then occurred.  The radiation prescription was entered and confirmed.  Then, I designed and supervised the construction of a total of 8 medically necessary complex treatment devices.  I have requested : 3D Simulation  I have requested a DVH of the following structures: GTV, PTV, heart, lungs, spinal cord, esophagus.  I have ordered:dose calc.  PLAN:  The patient will receive 60 Gy in 30 fractions along with radiosensitizing chemotherapy with radiation fields encompassing all PET positive disease including the right eighth rib.  Special Treatment Procedure Note: The patient will be receiving radiosensitizing chemotherapy. Given the potential of increased toxicities related to combined therapy and the necessity for close monitoring of the patient and blood work, this  constitutes a special treatment procedure.  -----------------------------------  Blair Promise, PhD, MD  This document serves as a record of services personally performed by Gery Pray, MD. It was created on his behalf by Mary-Margaret Loma Messing, a trained medical scribe. The creation of this record is based on the scribe's personal observations and the provider's statements to them. This document has been checked and approved by the attending provider.

## 2018-07-28 NOTE — Progress Notes (Signed)
Oncology Nurse Navigator Documentation  Oncology Nurse Navigator Flowsheets 07/28/2018  Navigator Location [facility]  Navigator Encounter Type Other/I followed up on Mr. Stankovich schedule. His biopsy is set for 4/13.  I contacted scheduling to re-schedule him an appt to see Dr. Julien Nordmann after biopsy.   Barriers/Navigation Needs Coordination of Care  Interventions Coordination of Care  Coordination of Care Other  Acuity Level 1  Time Spent with Patient 15

## 2018-07-29 ENCOUNTER — Telehealth: Payer: Self-pay | Admitting: Internal Medicine

## 2018-07-29 NOTE — Telephone Encounter (Signed)
R/s appt per 3/25 sch message - left message for patient with appt date and time

## 2018-08-03 DIAGNOSIS — Z510 Encounter for antineoplastic radiation therapy: Secondary | ICD-10-CM | POA: Diagnosis not present

## 2018-08-04 ENCOUNTER — Ambulatory Visit
Admission: RE | Admit: 2018-08-04 | Discharge: 2018-08-04 | Disposition: A | Discharge status: Home / Self Care | Payer: Medicare Other | Source: Ambulatory Visit | Attending: Radiation Oncology | Admitting: Radiation Oncology

## 2018-08-04 ENCOUNTER — Other Ambulatory Visit: Payer: Self-pay

## 2018-08-04 VITALS — Temp 97.7°F

## 2018-08-04 DIAGNOSIS — R918 Other nonspecific abnormal finding of lung field: Secondary | ICD-10-CM | POA: Insufficient documentation

## 2018-08-04 DIAGNOSIS — Z510 Encounter for antineoplastic radiation therapy: Secondary | ICD-10-CM | POA: Diagnosis not present

## 2018-08-04 DIAGNOSIS — C7951 Secondary malignant neoplasm of bone: Secondary | ICD-10-CM

## 2018-08-04 DIAGNOSIS — C801 Malignant (primary) neoplasm, unspecified: Secondary | ICD-10-CM | POA: Insufficient documentation

## 2018-08-04 DIAGNOSIS — R590 Localized enlarged lymph nodes: Secondary | ICD-10-CM | POA: Diagnosis not present

## 2018-08-04 NOTE — Progress Notes (Signed)
  Radiation Oncology         (336) [phone] ________________________________  Name: Zachary Knox MRN: [id number]  Date: 08/04/2018  DOB: 1950/06/03  Simulation Verification Note    ICD-10-CM   1. 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 The clinical setup was verified. Then port films were obtained and uploaded to the radiation oncology medical record software.  The treatment beams were carefully compared against the planned radiation fields. The position location and shape of the radiation fields was reviewed. They targeted volume of tissue appears to be appropriately covered by the radiation beams. Organs at risk appear to be excluded as planned.  Based on my personal review, I approved the simulation verification. The patient's treatment will proceed as planned.  -----------------------------------  Blair Promise, PhD, MD

## 2018-08-05 ENCOUNTER — Other Ambulatory Visit: Payer: Self-pay

## 2018-08-05 ENCOUNTER — Ambulatory Visit
Admission: RE | Admit: 2018-08-05 | Discharge: 2018-08-05 | Disposition: A | Discharge status: Home / Self Care | Payer: Medicare Other | Source: Ambulatory Visit | Attending: Radiation Oncology | Admitting: Radiation Oncology

## 2018-08-06 ENCOUNTER — Ambulatory Visit
Admission: RE | Admit: 2018-08-06 | Discharge: 2018-08-06 | Disposition: A | Discharge status: Home / Self Care | Payer: Medicare Other | Source: Ambulatory Visit | Attending: Radiation Oncology | Admitting: Radiation Oncology

## 2018-08-06 ENCOUNTER — Other Ambulatory Visit: Payer: Self-pay

## 2018-08-09 ENCOUNTER — Ambulatory Visit
Admission: RE | Admit: 2018-08-09 | Discharge: 2018-08-09 | Disposition: A | Discharge status: Home / Self Care | Payer: Medicare Other | Source: Ambulatory Visit | Attending: Radiation Oncology | Admitting: Radiation Oncology

## 2018-08-09 ENCOUNTER — Other Ambulatory Visit: Payer: Self-pay

## 2018-08-10 ENCOUNTER — Ambulatory Visit
Admission: RE | Admit: 2018-08-10 | Discharge: 2018-08-10 | Disposition: A | Discharge status: Home / Self Care | Payer: Medicare Other | Source: Ambulatory Visit | Attending: Radiation Oncology | Admitting: Radiation Oncology

## 2018-08-10 ENCOUNTER — Ambulatory Visit: Payer: Medicare Other | Admitting: Internal Medicine

## 2018-08-10 ENCOUNTER — Encounter (HOSPITAL_COMMUNITY): Payer: Self-pay | Admitting: Emergency Medicine

## 2018-08-10 ENCOUNTER — Emergency Department (HOSPITAL_COMMUNITY)
Admission: EM | Admit: 2018-08-10 | Discharge: 2018-08-10 | Disposition: A | Discharge status: Home / Self Care | Payer: Medicare Other | Attending: Emergency Medicine | Admitting: Emergency Medicine

## 2018-08-10 ENCOUNTER — Emergency Department (HOSPITAL_COMMUNITY): Payer: Medicare Other

## 2018-08-10 ENCOUNTER — Other Ambulatory Visit: Payer: Self-pay

## 2018-08-10 DIAGNOSIS — C7952 Secondary malignant neoplasm of bone marrow: Secondary | ICD-10-CM | POA: Insufficient documentation

## 2018-08-10 DIAGNOSIS — Z85118 Personal history of other malignant neoplasm of bronchus and lung: Secondary | ICD-10-CM | POA: Insufficient documentation

## 2018-08-10 DIAGNOSIS — R0789 Other chest pain: Secondary | ICD-10-CM | POA: Insufficient documentation

## 2018-08-10 DIAGNOSIS — C7951 Secondary malignant neoplasm of bone: Secondary | ICD-10-CM

## 2018-08-10 DIAGNOSIS — C779 Secondary and unspecified malignant neoplasm of lymph node, unspecified: Secondary | ICD-10-CM | POA: Insufficient documentation

## 2018-08-10 DIAGNOSIS — Z7982 Long term (current) use of aspirin: Secondary | ICD-10-CM | POA: Insufficient documentation

## 2018-08-10 DIAGNOSIS — R079 Chest pain, unspecified: Secondary | ICD-10-CM

## 2018-08-10 DIAGNOSIS — Z87891 Personal history of nicotine dependence: Secondary | ICD-10-CM | POA: Insufficient documentation

## 2018-08-10 DIAGNOSIS — Z79899 Other long term (current) drug therapy: Secondary | ICD-10-CM | POA: Insufficient documentation

## 2018-08-10 HISTORY — DX: Malignant (primary) neoplasm, unspecified: C80.1

## 2018-08-10 MED ORDER — SONAFINE EX EMUL
1.0000 "application " | Freq: Two times a day (BID) | CUTANEOUS | Status: DC
  Administered 2018-08-10: 1 via TOPICAL

## 2018-08-10 MED ORDER — HYDROMORPHONE HCL 1 MG/ML IJ SOLN
1.0000 mg | Freq: Once | INTRAMUSCULAR | Status: AC
  Administered 2018-08-10: 1 mg via INTRAVENOUS
  Filled 2018-08-10: qty 1

## 2018-08-10 MED ORDER — ONDANSETRON HCL 4 MG/2ML IJ SOLN
4.0000 mg | Freq: Once | INTRAMUSCULAR | Status: AC
  Administered 2018-08-10: 4 mg via INTRAVENOUS
  Filled 2018-08-10: qty 2

## 2018-08-10 MED ORDER — OXYCODONE-ACETAMINOPHEN 5-325 MG PO TABS
1.0000 | ORAL_TABLET | Freq: Once | ORAL | Status: AC
  Administered 2018-08-10: 1 via ORAL
  Filled 2018-08-10: qty 1

## 2018-08-10 NOTE — ED Triage Notes (Signed)
Pt reports having RUQ pain that has been ongoing for the last day. Pt reports having radiation treatment for lung cancer on right side.

## 2018-08-10 NOTE — Discharge Instructions (Signed)
We believe that your ongoing pain is due to the cancer in your right lung which has spread to your rib.  You may use 1-2 tablets of oxycodone/acetaminophen for pain control every 6 hours as needed.  Continue follow up with your Oncologist and Radiation physician to discuss your ongoing pain.  You may return to the ED for new or concerning symptoms.

## 2018-08-10 NOTE — ED Provider Notes (Signed)
[facility] DEPT Provider Note   CSN: [id number] Arrival date & time: 08/10/18  0038    History   Chief Complaint Chief Complaint  Patient presents with   Abdominal Pain    HPI Zachary Knox is a 68 y.o. male.    68 y/o with hx of CA of the R lung with metastasis to bone and lymph notes (last radiation yesterday; not currently on chemotherapy) presents to the ED for c/o R lower chest wall pain.  Patient states that he has had persistent pain over the past 3 weeks which is intermittent.  It can worsen with certain movements as well as when picking up something that is heavy.  He occasionally feels worsening discomfort with very deep breaths, but has not had any shortness of breath.  Usually has pain control with 1 tablet of Percocet at home, but this did not help him tonight.  He has not had any fevers, cough, hemoptysis, leg swelling, lightheadedness or dizziness, syncope.    States that his pain feels the same as when he was evaluated on 07/20/2018.  He underwent a negative CTA of his chest at this time.  Pain was felt to be secondary to his right lower lobe lung cancer with bony metastasis.  He is scheduled to undergo mass biopsy on Monday.  Oncology -Dr. 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 Authorizing Provider  acetaminophen (TYLENOL) 500 MG tablet Take 1,000 mg by mouth every 6 (six) hours as  needed for mild pain.    Yes [provider]  amLODipine (NORVASC) 5 MG tablet Take 5 mg by mouth daily. 05/30/18  Yes [provider]  aspirin EC 81 MG tablet Take 81 mg by mouth daily.    Yes [provider]  atorvastatin (LIPITOR) 20 MG tablet Take 20 mg by mouth daily.   Yes [provider]  carvedilol (COREG) 3.125 MG tablet Take 3.125 mg by mouth 2 (two) times daily with a meal.   Yes [provider]  gabapentin (NEURONTIN) 300 MG capsule Take 300 mg by mouth at bedtime.  02/11/18  Yes [provider]  ibuprofen (ADVIL,MOTRIN) 200 MG tablet Take 200 mg by mouth every 6 (six) hours as needed for moderate pain.   Yes [provider]  levothyroxine (SYNTHROID, LEVOTHROID) 100 MCG tablet Take 100 mcg by mouth daily. 06/26/18  Yes [provider]  metFORMIN (GLUCOPHAGE) 1000 MG tablet Take 500-1,000 mg by mouth See admin instructions. Take 1000mg  in the am and 500mg  in the pm.   Yes [provider]  metoprolol succinate (TOPROL-XL) 100 MG 24 hr tablet Take 100 mg by mouth daily. Take with or immediately following a meal.   Yes [provider]  oxyCODONE-acetaminophen (PERCOCET/ROXICET) 5-325 MG tablet Take 1 tablet by mouth every 4 (four) hours  as needed for severe pain. 07/28/18  Yes Gery Pray, MD  pantoprazole (PROTONIX) 40 MG tablet Take 40 mg by mouth 2 (two) times daily. 05/17/18  Yes [provider]  ramipril (ALTACE) 10 MG capsule Take 10 mg by mouth daily. 04/21/18  Yes [provider]  TOUJEO SOLOSTAR 300 UNIT/ML SOPN Inject 18 Units into the skin every morning. 06/16/18  Yes [provider]  traMADol (ULTRAM) 50 MG tablet Take 1 tablet (50 mg total) by mouth every 6 (six) hours as needed. Patient taking differently: Take 50 mg by mouth every 6 (six) hours as needed for moderate pain.  07/05/18  Yes Lajean Saver, MD  diphenhydrAMINE (BENADRYL) 25 MG tablet Take 1 tablet (25 mg  total) by mouth every 6 (six) hours. Patient not taking: Reported on 08/10/2018 08/23/12   Coralee North A, PA-C  famotidine (PEPCID) 20 MG tablet Take 1 tablet (20 mg total) by mouth 2 (two) times daily. Patient not taking: Reported on 08/10/2018 08/23/12   Ardath Sax    Family History History reviewed. No pertinent family history.  Social History Social History   Tobacco Use   Smoking status: Former Smoker   Smokeless tobacco: Never Used  Substance Use Topics   Alcohol use: Yes    Comment: occassional    Drug use: Not on file     Allergies   Patient has no known allergies.   Review of Systems Review of Systems  Gastrointestinal: Positive for abdominal pain.  Ten systems reviewed and are negative for acute change, except as noted in the HPI.     Physical Exam Updated Vital Signs BP (!) 135/99 (BP Location: Left Arm)    Pulse 61    Temp 98.1 F (36.7 C) (Oral)    Resp 15    Ht 5\' 9"  (1.753 m)    Wt 81.6 kg    SpO2 95%    BMI 26.58 kg/m   Physical Exam Vitals signs and nursing note reviewed.  Constitutional:      General: He is not in acute distress.    Appearance: He is well-developed. He is not diaphoretic.     Comments: Nontoxic appearing, pleasant.  HENT:     Head: Normocephalic and atraumatic.  Eyes:     General: No scleral icterus.    Conjunctiva/sclera: Conjunctivae normal.  Neck:     Musculoskeletal: Normal range of motion.  Cardiovascular:     Rate and Rhythm: Normal rate and regular rhythm.     Pulses: Normal pulses.  Pulmonary:     Effort: Pulmonary effort is normal. No respiratory distress.     Comments: Respirations even and unlabored. SpO2 100% on room air. Chest:     Chest wall: Tenderness (right lower chest wall; no crepitus.) present.  Abdominal:       Comments: Soft abdomen without focal TTP. No peritoneal signs. Negative Murphy's sign.  Musculoskeletal: Normal range of motion.     Comments: No BLE edema.  Skin:    General:  Skin is warm and dry.     Coloration: Skin is not pale.     Findings: No erythema or rash.  Neurological:     Mental Status: He is alert and oriented to person, place, and time.     Coordination: Coordination normal.     Comments: Moving all extremities spontaneously.  Psychiatric:        Behavior: Behavior normal.      ED Treatments / Results  Labs (all labs  ordered are listed, but only abnormal results are displayed) Labs Reviewed - No data to display  EKG None  Radiology Dg Chest 2 View  Result Date: 08/10/2018 CLINICAL DATA:  Right chest wall pain EXAM: CHEST - 2 VIEW COMPARISON:  Chest CT 07/20/2018 FINDINGS: Posterior right lung mass is better characterized on recent chest CT. No focal airspace consolidation or pulmonary edema. No pleural effusion or pneumothorax. IMPRESSION: No active cardiopulmonary disease. Electronically Signed   By: Ulyses Jarred M.D.   On: 08/10/2018 02:00    Procedures Procedures (including critical care time)  Medications Ordered in ED Medications  HYDROmorphone (DILAUDID) injection 1 mg (1 mg Intravenous Given 08/10/18 0153)  ondansetron (ZOFRAN) injection 4 mg (4 mg Intravenous Given 08/10/18 0153)  oxyCODONE-acetaminophen (PERCOCET/ROXICET) 5-325 MG per tablet 1 tablet (1 tablet Oral Given 08/10/18 0355)    3:27 AM Patient reassessed.  His pain is down to 3/10 from 9/10 on arrival.  The patient's chart has been reviewed.  He has history of negative CT angiogram of the chest 3 weeks ago in the setting of similar pain.  Pain has remained ongoing, consistent with chronic discomfort.  He has not had any tachycardia, hypoxia, hemoptysis, leg swelling, fevers.  Chest x-ray appears stable.  I do not believe the patient warrants repeat CTA at this time.  He has been encouraged to follow-up with his oncologist and IR physician.   Initial Impression / Assessment and Plan / ED Course  I have reviewed the triage vital signs and the nursing notes.  Pertinent  labs & imaging results that were available during my care of the patient were reviewed by me and considered in my medical decision making (see chart for details).        68 year old male presents the emergency department for right sided lower chest wall pain.  He has had similar pain for a number of weeks.  Was evaluated in the ED previously for this discomfort with reassuring evaluation.  History notable for negative CTA of the chest on 07/20/2018: No evidence of pulmonary embolus.  He usually takes Percocet for his symptoms, but this did not work for his pain tonight.  CXR today appears stable.  He has had improvement to his discomfort with 1 dose of 1 mg IV Dilaudid.  States that he is feeling much better.  Vitals are stable and patient is afebrile without hypoxia.  He is to continue follow-up with his oncologist and primary care doctor.  Return precautions discussed and provided. Patient discharged in satisfactory condition with no unaddressed concerns.  Zachary Knox was evaluated in Emergency Department on 08/10/2018 for the symptoms described in the history of present illness. He was evaluated in the context of the global COVID-19 pandemic, which necessitated consideration that the patient might be at risk for infection with the SARS-CoV-2 virus that causes COVID-19. Institutional protocols and algorithms that pertain to the evaluation of patients at risk for COVID-19 are in a state of rapid change based on information released by regulatory bodies including the CDC and federal and state organizations. These policies and algorithms were followed during the patient's care in the ED.   Final Clinical Impressions(s) / ED Diagnoses   Final diagnoses:  Right-sided chest pain    ED Discharge Orders    None       Antonietta Breach, PA-C 08/10/18 [id number]    Tegeler, Gwenyth Allegra, MD 08/10/18 [phone]

## 2018-08-10 NOTE — ED Notes (Signed)
Pt in xray

## 2018-08-11 ENCOUNTER — Other Ambulatory Visit: Payer: Self-pay

## 2018-08-11 ENCOUNTER — Ambulatory Visit
Admission: RE | Admit: 2018-08-11 | Discharge: 2018-08-11 | Disposition: A | Discharge status: Home / Self Care | Payer: Medicare Other | Source: Ambulatory Visit | Attending: Radiation Oncology | Admitting: Radiation Oncology

## 2018-08-12 ENCOUNTER — Ambulatory Visit
Admission: RE | Admit: 2018-08-12 | Discharge: 2018-08-12 | Disposition: A | Discharge status: Home / Self Care | Payer: Medicare Other | Source: Ambulatory Visit | Attending: Radiation Oncology | Admitting: Radiation Oncology

## 2018-08-12 ENCOUNTER — Other Ambulatory Visit: Payer: Self-pay | Admitting: Radiation Oncology

## 2018-08-12 ENCOUNTER — Other Ambulatory Visit: Payer: Self-pay

## 2018-08-12 MED ORDER — OXYCODONE-ACETAMINOPHEN 5-325 MG PO TABS: 1.0000 | ORAL_TABLET | ORAL | 0 refills | Status: DC | PRN

## 2018-08-13 ENCOUNTER — Other Ambulatory Visit: Payer: Self-pay | Admitting: Radiology

## 2018-08-13 ENCOUNTER — Other Ambulatory Visit: Payer: Self-pay | Admitting: Student

## 2018-08-13 ENCOUNTER — Ambulatory Visit
Admission: RE | Admit: 2018-08-13 | Discharge: 2018-08-13 | Disposition: A | Discharge status: Home / Self Care | Payer: Medicare Other | Source: Ambulatory Visit | Attending: Radiation Oncology | Admitting: Radiation Oncology

## 2018-08-13 ENCOUNTER — Other Ambulatory Visit: Payer: Self-pay

## 2018-08-16 ENCOUNTER — Ambulatory Visit (HOSPITAL_COMMUNITY)
Admission: RE | Admit: 2018-08-16 | Discharge: 2018-08-16 | Disposition: A | Discharge status: Home / Self Care | Payer: Medicare Other | Source: Ambulatory Visit | Attending: Internal Medicine | Admitting: Internal Medicine

## 2018-08-16 ENCOUNTER — Encounter (HOSPITAL_COMMUNITY): Payer: Self-pay

## 2018-08-16 ENCOUNTER — Ambulatory Visit (HOSPITAL_COMMUNITY)
Admission: RE | Admit: 2018-08-16 | Discharge: 2018-08-16 | Disposition: A | Discharge status: Home / Self Care | Payer: Medicare Other | Source: Ambulatory Visit | Attending: Diagnostic Radiology | Admitting: Diagnostic Radiology

## 2018-08-16 ENCOUNTER — Other Ambulatory Visit: Payer: Self-pay

## 2018-08-16 ENCOUNTER — Telehealth: Payer: Self-pay | Admitting: Internal Medicine

## 2018-08-16 ENCOUNTER — Ambulatory Visit
Admission: RE | Admit: 2018-08-16 | Discharge: 2018-08-16 | Disposition: A | Discharge status: Home / Self Care | Payer: Medicare Other | Source: Ambulatory Visit | Attending: Radiation Oncology | Admitting: Radiation Oncology

## 2018-08-16 DIAGNOSIS — R918 Other nonspecific abnormal finding of lung field: Secondary | ICD-10-CM | POA: Diagnosis not present

## 2018-08-16 DIAGNOSIS — M899 Disorder of bone, unspecified: Secondary | ICD-10-CM | POA: Diagnosis not present

## 2018-08-16 DIAGNOSIS — Z7982 Long term (current) use of aspirin: Secondary | ICD-10-CM | POA: Diagnosis not present

## 2018-08-16 DIAGNOSIS — Z79899 Other long term (current) drug therapy: Secondary | ICD-10-CM | POA: Insufficient documentation

## 2018-08-16 DIAGNOSIS — I10 Essential (primary) hypertension: Secondary | ICD-10-CM | POA: Insufficient documentation

## 2018-08-16 DIAGNOSIS — I252 Old myocardial infarction: Secondary | ICD-10-CM | POA: Insufficient documentation

## 2018-08-16 DIAGNOSIS — Z7984 Long term (current) use of oral hypoglycemic drugs: Secondary | ICD-10-CM | POA: Diagnosis not present

## 2018-08-16 DIAGNOSIS — E119 Type 2 diabetes mellitus without complications: Secondary | ICD-10-CM | POA: Diagnosis not present

## 2018-08-16 DIAGNOSIS — J439 Emphysema, unspecified: Secondary | ICD-10-CM | POA: Diagnosis not present

## 2018-08-16 DIAGNOSIS — Z79890 Hormone replacement therapy (postmenopausal): Secondary | ICD-10-CM | POA: Insufficient documentation

## 2018-08-16 DIAGNOSIS — Z955 Presence of coronary angioplasty implant and graft: Secondary | ICD-10-CM | POA: Diagnosis not present

## 2018-08-16 DIAGNOSIS — I2510 Atherosclerotic heart disease of native coronary artery without angina pectoris: Secondary | ICD-10-CM | POA: Insufficient documentation

## 2018-08-16 DIAGNOSIS — Z87891 Personal history of nicotine dependence: Secondary | ICD-10-CM | POA: Insufficient documentation

## 2018-08-16 DIAGNOSIS — Z8546 Personal history of malignant neoplasm of prostate: Secondary | ICD-10-CM | POA: Diagnosis not present

## 2018-08-16 LAB — PROTIME-INR
INR: 1 (ref 0.8–1.2)
Prothrombin Time: 13.3 seconds (ref 11.4–15.2)

## 2018-08-16 LAB — CBC
HCT: 40.3 % (ref 39.0–52.0)
Hemoglobin: 13.6 g/dL (ref 13.0–17.0)
MCH: 31.9 pg (ref 26.0–34.0)
MCHC: 33.7 g/dL (ref 30.0–36.0)
MCV: 94.6 fL (ref 80.0–100.0)
Platelets: 178 10*3/uL (ref 150–400)
RBC: 4.26 MIL/uL (ref 4.22–5.81)
RDW: 13.1 % (ref 11.5–15.5)
WBC: 5.3 10*3/uL (ref 4.0–10.5)
nRBC: 0 % (ref 0.0–0.2)

## 2018-08-16 LAB — GLUCOSE, CAPILLARY: Glucose-Capillary: 112 mg/dL — ABNORMAL HIGH (ref 70–99)

## 2018-08-16 LAB — APTT: aPTT: 32 seconds (ref 24–36)

## 2018-08-16 MED ORDER — FENTANYL CITRATE (PF) 100 MCG/2ML IJ SOLN
INTRAMUSCULAR | Status: AC
  Filled 2018-08-16: qty 2

## 2018-08-16 MED ORDER — FENTANYL CITRATE (PF) 100 MCG/2ML IJ SOLN
INTRAMUSCULAR | Status: AC | PRN
  Administered 2018-08-16 (×2): 25 ug via INTRAVENOUS
  Administered 2018-08-16: 50 ug via INTRAVENOUS

## 2018-08-16 MED ORDER — MIDAZOLAM HCL 2 MG/2ML IJ SOLN
INTRAMUSCULAR | Status: AC | PRN
  Administered 2018-08-16 (×2): 0.5 mg via INTRAVENOUS
  Administered 2018-08-16: 1 mg via INTRAVENOUS

## 2018-08-16 MED ORDER — SODIUM CHLORIDE 0.9 % IV SOLN: INTRAVENOUS | Status: DC

## 2018-08-16 MED ORDER — HYDROCODONE-ACETAMINOPHEN 5-325 MG PO TABS: 1.0000 | ORAL_TABLET | ORAL | Status: DC | PRN

## 2018-08-16 MED ORDER — MIDAZOLAM HCL 2 MG/2ML IJ SOLN
INTRAMUSCULAR | Status: AC
  Filled 2018-08-16: qty 2

## 2018-08-16 NOTE — Procedures (Signed)
Interventional Radiology Procedure:   Indications: Right lung lesion with right 8th rib lesion  Procedure: CT guided biopsy of rib lesion  Findings: 3 cores from right 8th rib lesion  Complications: None     EBL: Less than 5 ml  Plan: CXR in one hour, plan to discharge to home later today.     Magaly Pollina R. Anselm Pancoast, MD  Pager: [phone]

## 2018-08-16 NOTE — H&P (Signed)
Chief Complaint: Patient was seen in consultation today for right lung mass biopsy at the request of [facility]  Referring Physician(s): Mohamed,Mohamed  Supervising Physician: Markus Daft  Patient Status: [facility] - Out-pt  History of Present Illness: Zachary Knox is a 68 y.o. male   Hx DM; HTN; CAD/MI 2008 Hx prostate cancer Previous smoker Cough and right chest pain for over 1 mo  CT 07/05/18: IMPRESSION: 1. Spiculated 4 cm superior segment right lower lobe lung mass corresponding to the radiographic abnormality is highly suspicious for Bronchogenic Carcinoma, with associated Right hilar and subcarinal lymphadenopathy. 2.  Emphysema (ICD10-J43.9). 3. Calcified coronary artery atherosclerosis. Evidence of chronic fatty liver disease.  PET 3/17:  IMPRESSION: 1. Right lower lobe pulmonary mass is markedly hypermetabolic consistent with history of non-small-cell lung cancer. Hypermetabolic metastatic disease is seen in the right hilum and mediastinum. 2. Hypermetabolic posterior right eighth rib lesion consistent with bony metastatic involvement. No additional hypermetabolic bone metastases evident.  DIAGNOSIS:  PresumptivestageIV(T2b, N2, M1c) lung cancer, presented with right lower lobe lung mass as well as right hilar and mediastinal lymphadenopathy as well as bone metastasis in the right eighth rib  Was seen by Dr Julien Nordmann Need tissue diagnosis for treatment planning    Past Medical History:  Diagnosis Date   Cancer [facility]) 2020   lung   Diabetes mellitus without complication [facility])     Past Surgical History:  Procedure Laterality Date   CARDIAC SURGERY     Heart attack in 2008 with stent placement   stent in heart      Allergies: Patient has no known allergies.  Medications: Prior to Admission medications   Medication Sig Start Date End Date Taking? Authorizing Provider  acetaminophen (TYLENOL) 500 MG tablet Take 1,000 mg by mouth every 6  (six) hours as needed for mild pain.    Yes [provider]  amLODipine (NORVASC) 5 MG tablet Take 5 mg by mouth daily. 05/30/18  Yes [provider]  aspirin EC 81 MG tablet Take 81 mg by mouth daily.    Yes [provider]  atorvastatin (LIPITOR) 20 MG tablet Take 20 mg by mouth daily.   Yes [provider]  carvedilol (COREG) 3.125 MG tablet Take 3.125 mg by mouth 2 (two) times daily with a meal.   Yes [provider]  diphenhydrAMINE (BENADRYL) 25 MG tablet Take 1 tablet (25 mg total) by mouth every 6 (six) hours. 08/23/12  Yes Marny Lowenstein, PA-C  famotidine (PEPCID) 20 MG tablet Take 1 tablet (20 mg total) by mouth 2 (two) times daily. 08/23/12  Yes Coralee North A, PA-C  gabapentin (NEURONTIN) 300 MG capsule Take 300 mg by mouth at bedtime.  02/11/18  Yes [provider]  ibuprofen (ADVIL,MOTRIN) 200 MG tablet Take 200 mg by mouth every 6 (six) hours as needed for moderate pain.   Yes [provider]  levothyroxine (SYNTHROID, LEVOTHROID) 100 MCG tablet Take 100 mcg by mouth daily. 06/26/18  Yes [provider]  metFORMIN (GLUCOPHAGE) 1000 MG tablet Take 500-1,000 mg by mouth See admin instructions. Take 1000mg  in the am and 500mg  in the pm.   Yes [provider]  metoprolol succinate (TOPROL-XL) 100 MG 24 hr tablet Take 100 mg by mouth daily. Take with or immediately following a meal.   Yes [provider]  oxyCODONE-acetaminophen (PERCOCET/ROXICET) 5-325 MG tablet Take 1 tablet by mouth every 4 (four) hours as needed for severe pain. 08/12/18  Yes Gery Pray, MD  pantoprazole (PROTONIX) 40 MG tablet Take 40 mg by mouth 2 (two) times daily. 05/17/18  Yes [provider]  ramipril (ALTACE) 10 MG capsule Take 10 mg by mouth daily. 04/21/18  Yes [provider]  TOUJEO SOLOSTAR 300 UNIT/ML SOPN Inject 18 Units into the skin every morning. 06/16/18  Yes [provider]  traMADol  (ULTRAM) 50 MG tablet Take 1 tablet (50 mg total) by mouth every 6 (six) hours as needed. Patient taking differently: Take 50 mg by mouth every 6 (six) hours as needed for moderate pain.  07/05/18  Yes Lajean Saver, MD     History reviewed. No pertinent family history.  Social History   Socioeconomic History   Marital status: Married    Spouse name: Not on file   Number of children: Not on file   Years of education: Not on file   Highest education level: Not on file  Occupational History   Not on file  Social Needs   Financial resource strain: Not on file   Food insecurity:    Worry: Not on file    Inability: Not on file   Transportation needs:    Medical: Not on file    Non-medical: Not on file  Tobacco Use   Smoking status: Former Smoker   Smokeless tobacco: Never Used  Substance and Sexual Activity   Alcohol use: Yes    Comment: occassional    Drug use: Not on file   Sexual activity: Not on file  Lifestyle   Physical activity:    Days per week: Not on file    Minutes per session: Not on file   Stress: Not on file  Relationships   Social connections:    Talks on phone: Not on file    Gets together: Not on file    Attends religious service: Not on file    Active member of club or organization: Not on file    Attends meetings of clubs or organizations: Not on file    Relationship status: Not on file  Other Topics Concern   Not on file  Social History Narrative   Not on file    Review of Systems: A 12 point ROS discussed and pertinent positives are indicated in the HPI above.  All other systems are negative.  Review of Systems  Constitutional: Negative for activity change, fatigue and fever.  Respiratory: Positive for cough. Negative for shortness of breath.   Cardiovascular: Positive for chest pain.  Neurological: Positive for weakness.  Psychiatric/Behavioral: Negative for confusion and decreased concentration.    Vital Signs: BP (!)  142/86    Pulse 60    Temp 98 F (36.7 C) (Oral)    Resp 16    SpO2 100%   Physical Exam Vitals signs reviewed.  Cardiovascular:     Rate and Rhythm: Normal rate and regular rhythm.     Heart sounds: Normal heart sounds.  Pulmonary:     Breath sounds: Normal breath sounds.  Abdominal:     General: Bowel sounds are normal.  Musculoskeletal: Normal range of motion.  Skin:    General: Skin is warm and dry.  Neurological:     Mental Status: He is alert and oriented to person, place, and time.  Psychiatric:        Mood and Affect: Mood normal.        Behavior: Behavior normal.        Thought Content: Thought content normal.  Judgment: Judgment normal.     Imaging: Dg Chest 2 View  Result Date: 08/10/2018 CLINICAL DATA:  Right chest wall pain EXAM: CHEST - 2 VIEW COMPARISON:  Chest CT 07/20/2018 FINDINGS: Posterior right lung mass is better characterized on recent chest CT. No focal airspace consolidation or pulmonary edema. No pleural effusion or pneumothorax. IMPRESSION: No active cardiopulmonary disease. Electronically Signed   By: Ulyses Jarred M.D.   On: 08/10/2018 02:00   Ct Angio Chest Pe W And/or Wo Contrast  Result Date: 07/20/2018 CLINICAL DATA:  Pt reports RUQ pain that radiates around to his R back. Sxs onset was about a month and half ago, worsened 2 weeks ago so he went to [facility] ED. They found a mass in his R lung. Had an MRI and PET scan today. Pt reports pain is severe today. He has taken a Tramadol without relief. He denies any cough or fever/chills. EXAM: CT ANGIOGRAPHY CHEST WITH CONTRAST TECHNIQUE: Multidetector CT imaging of the chest was performed using the standard protocol during bolus administration of intravenous contrast. Multiplanar CT image reconstructions and MIPs were obtained to evaluate the vascular anatomy. CONTRAST:  111mL OMNIPAQUE IOHEXOL 350 MG/ML SOLN COMPARISON:  PET-CT, 07/20/2018.  Chest CT 07/05/2018. FINDINGS: Cardiovascular: There is  satisfactory opacification of the pulmonary arteries to the segmental level. No evidence of a pulmonary embolism. Heart is top-normal in size. Three-vessel coronary artery calcifications. No pericardial effusion. Great vessels normal caliber. Mild aortic atherosclerosis. No aortic dissection. Mediastinum/Nodes: Prominent right hilar and subcarinal nodes, hypermetabolic on PET-CT consistent with metastatic disease. No other mediastinal adenopathy. No left hilar adenopathy. No mediastinal masses. No neck base or axillary masses or adenopathy. Trachea and esophagus are unremarkable. Lungs/Pleura: Mass, posterior aspect of the right lobe superior segment, measuring 3.4 x 2.1 x 3.3 cm on the current study, without change from the prior exams allowing for differences in measurement technique. Dependent linear and reticular opacities in the lower lobes consistent with subsegmental atelectasis. Centrilobular and paraseptal emphysema in the upper lobes. No evidence of pneumonia or pulmonary edema. No pleural effusion or pneumothorax. Upper Abdomen: No acute findings. Musculoskeletal: Destructive bone lesion, posterolateral right eighth rib consistent with metastatic disease as noted on the current PET-CT. No other bone lesions. Review of the MIP images confirms the above findings. IMPRESSION: 1. No evidence of a pulmonary embolism. 2. Right lung mass consistent with bronchogenic carcinoma with right hilar and subcarinal metastatic adenopathy. 3. Destructive metastatic lesion in the posterolateral right eighth rib. This may be the source of the patient's back pain. 4. Chronic findings of centrilobular and paraseptal emphysema, coronary artery calcifications and mild aortic atherosclerosis. Aortic Atherosclerosis (ICD10-I70.0) and Emphysema (ICD10-J43.9). Electronically Signed   By: Lajean Manes M.D.   On: 07/20/2018 23:27   Mr Jeri Cos GE Contrast  Result Date: 07/20/2018 CLINICAL DATA:  Lung cancer staging EXAM: MRI HEAD  WITHOUT AND WITH CONTRAST TECHNIQUE: Multiplanar, multiecho pulse sequences of the brain and surrounding structures were obtained without and with intravenous contrast. CONTRAST:  8 mL Gadovist IV COMPARISON:  CT head 12/30/2005 FINDINGS: Brain: Ventricle size and cerebral volume normal. Minimal chronic changes in the periventricular white matter. No acute infarct. Negative for hemorrhage mass or edema. No enhancing metastatic deposits are identified. Vascular: Normal arterial flow voids Skull and upper cervical spine: Negative Sinuses/Orbits: Paranasal sinuses clear. Small right mastoid effusion. Normal orbit. Other: None IMPRESSION: Negative for metastatic disease to the brain. No acute abnormality. Minimal chronic white matter changes. Electronically Signed  By: Franchot Gallo M.D.   On: 07/20/2018 13:40   Nm Pet Image Initial (pi) Skull Base To Thigh  Result Date: 07/20/2018 CLINICAL DATA:  Initial treatment strategy for non-small-cell lung cancer. EXAM: NUCLEAR MEDICINE PET SKULL BASE TO THIGH TECHNIQUE: 10.2 mCi F-18 FDG was injected intravenously. Full-ring PET imaging was performed from the skull base to thigh after the radiotracer. CT data was obtained and used for attenuation correction and anatomic localization. Fasting blood glucose: 130 mg/dl COMPARISON:  Chest CT 07/05/2018 FINDINGS: Mediastinal blood pool activity: SUV max 3.3 NECK: No hypermetabolic lymph nodes in the neck. Incidental CT findings: none CHEST: The posterior right lower lobe mass is markedly hypermetabolic with SUV max = 9.6. Hypermetabolic metastases to the right hilum demonstrates SUV max = 8.8. Hypermetabolic subcarinal nodal metastases demonstrate SUV max = 10.8. Hypermetabolic small precarinal lymph node associated. Incidental CT findings: Atherosclerotic calcification is noted in the wall of the thoracic aorta. Coronary artery calcification is evident. Centrilobular and paraseptal emphysema noted in the lungs bilaterally.  ABDOMEN/PELVIS: No abnormal hypermetabolic activity within the liver, pancreas, adrenal glands, or spleen. No hypermetabolic lymph nodes in the abdomen or pelvis. Small focus of FDG uptake in the posterior stomach likely physiologic. Incidental CT findings: none SKELETON: 2.6 x 1.1 cm lesion in the posterior right eighth rib (83/4) is markedly hypermetabolic with SUV max = 8.6. Low level uptake in the right SI joint is probably degenerative. No other suspicious hypermetabolic bony disease evident. Incidental CT findings: none IMPRESSION: 1. Right lower lobe pulmonary mass is markedly hypermetabolic consistent with history of non-small-cell lung cancer. Hypermetabolic metastatic disease is seen in the right hilum and mediastinum. 2. Hypermetabolic posterior right eighth rib lesion consistent with bony metastatic involvement. No additional hypermetabolic bone metastases evident. 3.  Aortic Atherosclerois (ICD10-170.0) 4.  Emphysema. ([id number]-Y40.9) Electronically Signed   By: Misty Stanley M.D.   On: 07/20/2018 17:02    Labs:  CBC: Recent Labs    07/05/18 0751 07/12/18 1417 07/20/18 2156 08/16/18 0921  WBC 6.7 7.8 8.1 5.3  HGB 14.0 13.4 13.8 13.6  HCT 41.4 40.0 40.7 40.3  PLT 218 214 232 178    COAGS: Recent Labs    08/16/18 0921  INR 1.0  APTT 32    BMP: Recent Labs    07/05/18 0751 07/12/18 1417 07/20/18 2156  NA 137 140 136  K 4.0 4.4 4.4  CL 105 104 106  CO2 21* 25 20*  GLUCOSE 136* 110* 139*  BUN 9 11 14   CALCIUM 8.8* 8.8* 9.0  CREATININE 0.92 0.99 0.84  GFRNONAA >60 >60 >60  GFRAA >60 >60 >60    LIVER FUNCTION TESTS: Recent Labs    07/05/18 0751 07/12/18 1417  BILITOT 1.1 1.0  AST 25 17  ALT 22 19  ALKPHOS 60 61  PROT 6.7 6.8  ALBUMIN 3.6 3.6    TUMOR MARKERS: No results for input(s): AFPTM, CEA, CA199, CHROMGRNA in the last 8760 hours.  Assessment and Plan:  Right lung mass- presumed lung cancer +PET Need tissue diagnosis Has been seen by Dr Julien Nordmann  and Dr Sondra Come Plan for biopsy today for treatment planning Risks and benefits of CT guided lung nodule biopsy was discussed with the patient including, but not limited to bleeding, hemoptysis, respiratory failure requiring intubation, infection, pneumothorax requiring chest tube placement, stroke from air embolism or even death.  All of the patient's questions were answered and the patient is agreeable to proceed. Consent signed and in chart.  Thank you for this interesting consult.  I greatly enjoyed meeting Zachary Knox and look forward to participating in their care.  A copy of this report was sent to the requesting provider on this date.  Electronically Signed: Lavonia Drafts, PA-C 08/16/2018, 10:34 AM   I spent a total of  30 Minutes   in face to face in clinical consultation, greater than 50% of which was counseling/coordinating care for right lung mass biopsy

## 2018-08-16 NOTE — Telephone Encounter (Signed)
Per 4/8 schedule message moved f/u from 4/15 to 4/16. Spoke with spouse.

## 2018-08-16 NOTE — Discharge Instructions (Addendum)
Needle Biopsy of the Lung, Care After °This sheet gives you information about how to care for yourself after your procedure. Your health care provider may also give you more specific instructions. If you have problems or questions, contact your health care provider. °What can I expect after the procedure? °After the procedure, it is common to have: °· Soreness, pain, and tenderness where a tissue sample was taken (biopsy site). °· A cough. °· A sore throat. °Follow these instructions at home: °Biopsy site care °· Follow instructions from your health care provider about when to remove the bandage that was placed on the biopsy site. °· Keep the bandage dry until it has been removed. °· Check your biopsy site every day for signs of infection. Check for: °? More redness, swelling, or pain. °? More fluid or blood. °? Warmth to the touch. °? Pus or a bad smell. °General instructions ° °· Rest as directed by your health care provider. Ask your health care provider what activities are safe for you. °· Do not take baths, swim, or use a hot tub until your health care provider approves. °· Take over-the-counter and prescription medicines only as told by your health care provider. °· If you have airplane travel scheduled, talk with your health care provider about when it is safe for you to travel by airplane. °· It is up to you to get the results of your procedure. Ask your health care provider, or the department that is doing the procedure, when your results will be ready. °· Keep all follow-up visits as told by your health care provider. This is important. °Contact a health care provider if: °· You have more redness, swelling, or pain around your biopsy site. °· You have more fluid or blood coming from your biopsy site. °· Your biopsy site feels warm to the touch. °· You have pus or a bad smell coming from your biopsy site. °· You have a fever. °· You have pain that does not get better with medicine. °Get help right away  if: °· You have problems breathing. °· You have chest pain. °· You cough up blood. °· You faint. °· You have a fast heart rate. °Summary °· After a needle biopsy of the lung, it is common to have a cough, a sore throat, or soreness, pain, and tenderness where a tissue sample was taken (biopsy site). °· You should check your biopsy area every day for signs of infection, including pus or a bad smell, warmth, more fluid or blood, or more redness, swelling, or pain. °· You should not take baths, swim, or use a hot tub until your health care provider approves. °· It is up to you to get the results of your procedure. Ask your health care provider, or the department that is doing the procedure, when your results will be ready. °This information is not intended to replace advice given to you by your health care provider. Make sure you discuss any questions you have with your health care provider. °Document Released: 02/16/2007 Document Revised: 03/12/2016 Document Reviewed: 03/12/2016 °Elsevier Interactive Patient Education © 2019 [employer] ° ° ° °Moderate Conscious Sedation, Adult, Care After °These instructions provide you with information about caring for yourself after your procedure. Your health care provider may also give you more specific instructions. Your treatment has been planned according to current medical practices, but problems sometimes occur. Call your health care provider if you have any problems or questions after your procedure. °What can I expect   after the procedure? °After your procedure, it is common: °· To feel sleepy for several hours. °· To feel clumsy and have poor balance for several hours. °· To have poor judgment for several hours. °· To vomit if you eat too soon. °Follow these instructions at home: °For at least 24 hours after the procedure: ° °· Do not: °? Participate in activities where you could fall or become injured. °? Drive. °? Use heavy machinery. °? Drink alcohol. °? Take sleeping  pills or medicines that cause drowsiness. °? Make important decisions or sign legal documents. °? Take care of children on your own. °· Rest. °Eating and drinking °· Follow the diet recommended by your health care provider. °· If you vomit: °? Drink water, juice, or soup when you can drink without vomiting. °? Make sure you have little or no nausea before eating solid foods. °General instructions °· Have a responsible adult stay with you until you are awake and alert. °· Take over-the-counter and prescription medicines only as told by your health care provider. °· If you smoke, do not smoke without supervision. °· Keep all follow-up visits as told by your health care provider. This is important. °Contact a health care provider if: °· You keep feeling nauseous or you keep vomiting. °· You feel light-headed. °· You develop a rash. °· You have a fever. °Get help right away if: °· You have trouble breathing. °This information is not intended to replace advice given to you by your health care provider. Make sure you discuss any questions you have with your health care provider. °Document Released: 02/09/2013 Document Revised: 09/24/2015 Document Reviewed: 08/11/2015 °Elsevier Interactive Patient Education © 2019 [employer] ° ° °

## 2018-08-17 ENCOUNTER — Other Ambulatory Visit: Payer: Self-pay

## 2018-08-17 ENCOUNTER — Ambulatory Visit
Admission: RE | Admit: 2018-08-17 | Discharge: 2018-08-17 | Disposition: A | Discharge status: Home / Self Care | Payer: Medicare Other | Source: Ambulatory Visit | Attending: Radiation Oncology | Admitting: Radiation Oncology

## 2018-08-18 ENCOUNTER — Other Ambulatory Visit: Payer: Self-pay

## 2018-08-18 ENCOUNTER — Ambulatory Visit
Admission: RE | Admit: 2018-08-18 | Discharge: 2018-08-18 | Disposition: A | Discharge status: Home / Self Care | Payer: Medicare Other | Source: Ambulatory Visit | Attending: Radiation Oncology | Admitting: Radiation Oncology

## 2018-08-18 ENCOUNTER — Ambulatory Visit: Payer: Medicare Other | Admitting: Internal Medicine

## 2018-08-19 ENCOUNTER — Other Ambulatory Visit: Payer: Self-pay

## 2018-08-19 ENCOUNTER — Encounter: Payer: Self-pay | Admitting: Internal Medicine

## 2018-08-19 ENCOUNTER — Encounter: Payer: Self-pay | Admitting: *Deleted

## 2018-08-19 ENCOUNTER — Inpatient Hospital Stay: Payer: Medicare Other | Attending: Internal Medicine | Admitting: Internal Medicine

## 2018-08-19 ENCOUNTER — Ambulatory Visit
Admission: RE | Admit: 2018-08-19 | Discharge: 2018-08-19 | Disposition: A | Discharge status: Home / Self Care | Payer: Medicare Other | Source: Ambulatory Visit | Attending: Radiation Oncology | Admitting: Radiation Oncology

## 2018-08-19 VITALS — BP 121/83 | HR 60 | Temp 97.9°F | Resp 17 | Ht 69.0 in | Wt 181.2 lb

## 2018-08-19 DIAGNOSIS — G893 Neoplasm related pain (acute) (chronic): Secondary | ICD-10-CM

## 2018-08-19 DIAGNOSIS — Z5111 Encounter for antineoplastic chemotherapy: Secondary | ICD-10-CM | POA: Insufficient documentation

## 2018-08-19 DIAGNOSIS — C7951 Secondary malignant neoplasm of bone: Secondary | ICD-10-CM

## 2018-08-19 DIAGNOSIS — C3491 Malignant neoplasm of unspecified part of right bronchus or lung: Secondary | ICD-10-CM | POA: Insufficient documentation

## 2018-08-19 DIAGNOSIS — Z7189 Other specified counseling: Secondary | ICD-10-CM

## 2018-08-19 DIAGNOSIS — C3431 Malignant neoplasm of lower lobe, right bronchus or lung: Secondary | ICD-10-CM | POA: Diagnosis not present

## 2018-08-19 NOTE — Progress Notes (Signed)
Oncology Nurse Navigator Documentation  Oncology Nurse Navigator Flowsheets 08/19/2018  Navigator Location [facility]  Navigator Encounter Type Other/per Dr. Julien Nordmann, I updated pathology on his request for foundation one and PDL 1 testing on tissue [id number].   Barriers/Navigation Needs Coordination of Care  Interventions Coordination of Care  Coordination of Care Other  Acuity Level 2  Time Spent with Patient 15

## 2018-08-19 NOTE — Progress Notes (Signed)
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  PRIOR THERAPY: None  CURRENT THERAPY: Palliative radiotherapy to the right eighth rib in addition to concurrent chemoradiation with weekly carboplatin for AUC of 2 and paclitaxel 45 mg/M2 to the lung mass and mediastinal lymphadenopathy for the locally advanced disease  INTERVAL HISTORY: Zachary Knox 68 y.o. male returns to the clinic today for follow-up visit.  His wife and daughter were available by phone.  He is feeling fine today with no concerning complaints except for persistent pain on the right side of the chest from the metastatic disease in the right eighth rib.  The patient denied having any current shortness of breath, cough or hemoptysis.  He denied having any weight loss or night sweats.  He has no nausea, vomiting, diarrhea or constipation.  He has no headache or visual changes.  He underwent CT-guided core biopsy of the lytic lesion in the right eighth rib and the final pathology was consistent with metastatic adenocarcinoma.  The patient was also seen by Dr. Sondra Come for consideration of palliative radiotherapy to the right rib lesion.  He started this treatment a week ago.  He is also undergoing a course of radiotherapy to the locally advanced disease in the lung.  He is here today for evaluation and discussion of his systemic treatment options.  MEDICAL HISTORY: Past Medical History:  Diagnosis Date   Cancer ([facility]) 2020   lung   Diabetes mellitus without complication ([facility])     ALLERGIES:  has No Known Allergies.  MEDICATIONS:  Current Outpatient  Medications  Medication Sig Dispense Refill   acetaminophen (TYLENOL) 500 MG tablet Take 1,000 mg by mouth every 6 (six) hours as needed for mild pain.      amLODipine (NORVASC) 5 MG tablet Take 5 mg by mouth daily.     aspirin EC 81 MG tablet Take 81 mg by mouth daily.      atorvastatin (LIPITOR) 20 MG tablet Take 20 mg by mouth daily.     carvedilol (COREG) 3.125 MG tablet Take 3.125 mg by mouth 2 (two) times daily with a meal.     diphenhydrAMINE (BENADRYL) 25 MG tablet Take 1 tablet (25 mg total) by mouth every 6 (six) hours. 20 tablet 0   famotidine (PEPCID) 20 MG tablet Take 1 tablet (20 mg total) by mouth 2 (two) times daily. 10 tablet 0   gabapentin (NEURONTIN) 300 MG capsule Take 300 mg by mouth at bedtime.      ibuprofen (ADVIL,MOTRIN) 200 MG tablet Take 200 mg by mouth every 6 (six) hours as needed for moderate pain.     levothyroxine (SYNTHROID, LEVOTHROID) 100 MCG tablet Take 100 mcg by mouth daily.     metFORMIN (GLUCOPHAGE) 1000 MG tablet Take 500-1,000 mg by mouth See admin instructions. Take 1000mg  in the am and 500mg  in the pm.     metoprolol succinate (TOPROL-XL) 100 MG 24 hr tablet Take 100 mg by mouth daily. Take with or immediately following a meal.     oxyCODONE-acetaminophen (PERCOCET/ROXICET) 5-325 MG tablet Take 1 tablet by mouth every 4 (four) hours as needed for severe  pain. 60 tablet 0   pantoprazole (PROTONIX) 40 MG tablet Take 40 mg by mouth 2 (two) times daily.     ramipril (ALTACE) 10 MG capsule Take 10 mg by mouth daily.     TOUJEO SOLOSTAR 300 UNIT/ML SOPN Inject 18 Units into the skin every morning.     traMADol (ULTRAM) 50 MG tablet Take 1 tablet (50 mg total) by mouth every 6 (six) hours as needed. (Patient taking differently: Take 50 mg by mouth every 6 (six) hours as needed for moderate pain. ) 20 tablet 0   No current facility-administered medications for this visit.     SURGICAL HISTORY:  Past Surgical History:  Procedure Laterality  Date   CARDIAC SURGERY     Heart attack in 2008 with stent placement   stent in heart      REVIEW OF SYSTEMS:  Constitutional: positive for fatigue Eyes: negative Ears, nose, mouth, throat, and face: negative Respiratory: positive for pleurisy/chest pain Cardiovascular: negative Gastrointestinal: negative Genitourinary:negative Integument/breast: negative Hematologic/lymphatic: negative Musculoskeletal:negative Neurological: negative Behavioral/Psych: negative Endocrine: negative Allergic/Immunologic: negative   PHYSICAL EXAMINATION: General appearance: alert, cooperative, fatigued and no distress Head: Normocephalic, without obvious abnormality, atraumatic Neck: no adenopathy, no JVD, supple, symmetrical, trachea midline and thyroid not enlarged, symmetric, no tenderness/mass/nodules Lymph nodes: Cervical, supraclavicular, and axillary nodes normal. Resp: clear to auscultation bilaterally Back: symmetric, no curvature. ROM normal. No CVA tenderness. Cardio: regular rate and rhythm, S1, S2 normal, no murmur, click, rub or gallop GI: soft, non-tender; bowel sounds normal; no masses,  no organomegaly Extremities: extremities normal, atraumatic, no cyanosis or edema Neurologic: Alert and oriented X 3, normal strength and tone. Normal symmetric reflexes. Normal coordination and gait  ECOG PERFORMANCE STATUS: 1 - Symptomatic but completely ambulatory  Blood pressure 121/83, pulse 60, temperature 97.9 F (36.6 C), temperature source Oral, resp. rate 17, height 5\' 9"  (1.753 m), weight 181 lb 3.2 oz (82.2 kg), SpO2 100 %.  LABORATORY DATA: Lab Results  Component Value Date   WBC 5.3 08/16/2018   HGB 13.6 08/16/2018   HCT 40.3 08/16/2018   MCV 94.6 08/16/2018   PLT 178 08/16/2018      Chemistry      Component Value Date/Time   NA 136 07/20/2018 2156   K 4.4 07/20/2018 2156   CL 106 07/20/2018 2156   CO2 20 (L) 07/20/2018 2156   BUN 14 07/20/2018 2156   CREATININE 0.84  07/20/2018 2156   CREATININE 0.99 07/12/2018 1417      Component Value Date/Time   CALCIUM 9.0 07/20/2018 2156   ALKPHOS 61 07/12/2018 1417   AST 17 07/12/2018 1417   ALT 19 07/12/2018 1417   BILITOT 1.0 07/12/2018 1417       RADIOGRAPHIC STUDIES: Dg Chest 2 View  Result Date: 08/10/2018 CLINICAL DATA:  Right chest wall pain EXAM: CHEST - 2 VIEW COMPARISON:  Chest CT 07/20/2018 FINDINGS: Posterior right lung mass is better characterized on recent chest CT. No focal airspace consolidation or pulmonary edema. No pleural effusion or pneumothorax. IMPRESSION: No active cardiopulmonary disease. Electronically Signed   By: Ulyses Jarred M.D.   On: 08/10/2018 02:00   Ct Angio Chest Pe W And/or Wo Contrast  Result Date: 07/20/2018 CLINICAL DATA:  Pt reports RUQ pain that radiates around to his R back. Sxs onset was about a month and half ago, worsened 2 weeks ago so he went to [facility] ED. They found a mass in his R lung. Had an MRI and PET scan  today. Pt reports pain is severe today. He has taken a Tramadol without relief. He denies any cough or fever/chills. EXAM: CT ANGIOGRAPHY CHEST WITH CONTRAST TECHNIQUE: Multidetector CT imaging of the chest was performed using the standard protocol during bolus administration of intravenous contrast. Multiplanar CT image reconstructions and MIPs were obtained to evaluate the vascular anatomy. CONTRAST:  147mL OMNIPAQUE IOHEXOL 350 MG/ML SOLN COMPARISON:  PET-CT, 07/20/2018.  Chest CT 07/05/2018. FINDINGS: Cardiovascular: There is satisfactory opacification of the pulmonary arteries to the segmental level. No evidence of a pulmonary embolism. Heart is top-normal in size. Three-vessel coronary artery calcifications. No pericardial effusion. Great vessels normal caliber. Mild aortic atherosclerosis. No aortic dissection. Mediastinum/Nodes: Prominent right hilar and subcarinal nodes, hypermetabolic on PET-CT consistent with metastatic disease. No other mediastinal  adenopathy. No left hilar adenopathy. No mediastinal masses. No neck base or axillary masses or adenopathy. Trachea and esophagus are unremarkable. Lungs/Pleura: Mass, posterior aspect of the right lobe superior segment, measuring 3.4 x 2.1 x 3.3 cm on the current study, without change from the prior exams allowing for differences in measurement technique. Dependent linear and reticular opacities in the lower lobes consistent with subsegmental atelectasis. Centrilobular and paraseptal emphysema in the upper lobes. No evidence of pneumonia or pulmonary edema. No pleural effusion or pneumothorax. Upper Abdomen: No acute findings. Musculoskeletal: Destructive bone lesion, posterolateral right eighth rib consistent with metastatic disease as noted on the current PET-CT. No other bone lesions. Review of the MIP images confirms the above findings. IMPRESSION: 1. No evidence of a pulmonary embolism. 2. Right lung mass consistent with bronchogenic carcinoma with right hilar and subcarinal metastatic adenopathy. 3. Destructive metastatic lesion in the posterolateral right eighth rib. This may be the source of the patient's back pain. 4. Chronic findings of centrilobular and paraseptal emphysema, coronary artery calcifications and mild aortic atherosclerosis. Aortic Atherosclerosis (ICD10-I70.0) and Emphysema (ICD10-J43.9). Electronically Signed   By: Lajean Manes M.D.   On: 07/20/2018 23:27   Mr Jeri Cos NW Contrast  Result Date: 07/20/2018 CLINICAL DATA:  Lung cancer staging EXAM: MRI HEAD WITHOUT AND WITH CONTRAST TECHNIQUE: Multiplanar, multiecho pulse sequences of the brain and surrounding structures were obtained without and with intravenous contrast. CONTRAST:  8 mL Gadovist IV COMPARISON:  CT head 12/30/2005 FINDINGS: Brain: Ventricle size and cerebral volume normal. Minimal chronic changes in the periventricular white matter. No acute infarct. Negative for hemorrhage mass or edema. No enhancing metastatic  deposits are identified. Vascular: Normal arterial flow voids Skull and upper cervical spine: Negative Sinuses/Orbits: Paranasal sinuses clear. Small right mastoid effusion. Normal orbit. Other: None IMPRESSION: Negative for metastatic disease to the brain. No acute abnormality. Minimal chronic white matter changes. Electronically Signed   By: Franchot Gallo M.D.   On: 07/20/2018 13:40   Nm Pet Image Initial (pi) Skull Base To Thigh  Result Date: 07/20/2018 CLINICAL DATA:  Initial treatment strategy for non-small-cell lung cancer. EXAM: NUCLEAR MEDICINE PET SKULL BASE TO THIGH TECHNIQUE: 10.2 mCi F-18 FDG was injected intravenously. Full-ring PET imaging was performed from the skull base to thigh after the radiotracer. CT data was obtained and used for attenuation correction and anatomic localization. Fasting blood glucose: 130 mg/dl COMPARISON:  Chest CT 07/05/2018 FINDINGS: Mediastinal blood pool activity: SUV max 3.3 NECK: No hypermetabolic lymph nodes in the neck. Incidental CT findings: none CHEST: The posterior right lower lobe mass is markedly hypermetabolic with SUV max = 9.6. Hypermetabolic metastases to the right hilum demonstrates SUV max = 8.8. Hypermetabolic subcarinal nodal metastases  demonstrate SUV max = 10.8. Hypermetabolic small precarinal lymph node associated. Incidental CT findings: Atherosclerotic calcification is noted in the wall of the thoracic aorta. Coronary artery calcification is evident. Centrilobular and paraseptal emphysema noted in the lungs bilaterally. ABDOMEN/PELVIS: No abnormal hypermetabolic activity within the liver, pancreas, adrenal glands, or spleen. No hypermetabolic lymph nodes in the abdomen or pelvis. Small focus of FDG uptake in the posterior stomach likely physiologic. Incidental CT findings: none SKELETON: 2.6 x 1.1 cm lesion in the posterior right eighth rib (83/4) is markedly hypermetabolic with SUV max = 8.6. Low level uptake in the right SI joint is probably  degenerative. No other suspicious hypermetabolic bony disease evident. Incidental CT findings: none IMPRESSION: 1. Right lower lobe pulmonary mass is markedly hypermetabolic consistent with history of non-small-cell lung cancer. Hypermetabolic metastatic disease is seen in the right hilum and mediastinum. 2. Hypermetabolic posterior right eighth rib lesion consistent with bony metastatic involvement. No additional hypermetabolic bone metastases evident. 3.  Aortic Atherosclerois (ICD10-170.0) 4.  Emphysema. (EYC14-G81.9) Electronically Signed   By: Misty Stanley M.D.   On: 07/20/2018 17:02   Ct Biopsy  Result Date: 08/16/2018 INDICATION: 68 year old with a lung lesion in the right lower lobe superior segment. Suspicious chest lymphadenopathy and suspicious right eighth rib lesion. Patient needs a tissue diagnosis. EXAM: CT-GUIDED CORE BIOPSY OF THE RIGHT EIGHTH RIB LESION MEDICATIONS: None. ANESTHESIA/SEDATION: Moderate (conscious) sedation was employed during this procedure. A total of Versed 2.0 mg and Fentanyl 100 mcg was administered intravenously. Moderate Sedation Time: 25 minutes. The patient's level of consciousness and vital signs were monitored continuously by radiology nursing throughout the procedure under my direct supervision. FLUOROSCOPY TIME:  None COMPLICATIONS: None immediate. PROCEDURE: Informed written consent was obtained from the patient after a thorough discussion of the procedural risks, benefits and alternatives. All questions were addressed. Maximal Sterile Barrier Technique was utilized including caps, mask, sterile gowns, sterile gloves, sterile drape, hand hygiene and skin antiseptic. A timeout was performed prior to the initiation of the procedure. Patient was placed prone on the CT scanner. Images through the chest were obtained. The lesion involving the posterior right eighth rib was targeted for biopsy. Right side of the back was prepped with chlorhexidine and sterile field was  created. Skin and soft tissues were anesthetized with 1% lidocaine. 17 gauge coaxial needle was directed into the soft tissue component of the rib lesion. Needle position confirmed with CT guidance. Core biopsies were obtained with an 18 gauge device and specimens placed in formalin. Needle removed without complication. Bandage placed over the puncture site. FINDINGS: Lesion in superior segment of the right lower lobe. Partially destructive lesion involving the posterior right eighth rib with a soft tissue component. Core biopsies were obtained from the soft tissue component. IMPRESSION: CT-guided core biopsies of the right eighth rib lesion. Electronically Signed   By: Markus Daft M.D.   On: 08/16/2018 15:50   Dg Chest Port 1 View  Result Date: 08/16/2018 CLINICAL DATA:  68 year old male with a history of eighth rib biopsy EXAM: PORTABLE CHEST 1 VIEW COMPARISON:  08/10/2018, 07/05/2018 FINDINGS: Cardiomediastinal silhouette within normal limits in size and contour. No evidence pleural effusion, pneumothorax, or confluent airspace disease. Destructive lesion at the posterior aspect of the right eighth rib. IMPRESSION: No complicating features of the chest x-ray, status post right eighth rib lesion biopsy. Electronically Signed   By: Corrie Mckusick D.O.   On: 08/16/2018 13:52    ASSESSMENT AND PLAN: This is a very pleasant  68 years old African-American male recently diagnosed with stage IV (T2b, N2, M1c) non-small cell lung cancer, adenocarcinoma diagnosed in April 2020 and presented with right lower lobe lung mass in addition to mediastinal lymphadenopathy and metastatic disease to the right eighth rib. I had a lengthy discussion with the patient today about his current disease stage, prognosis and treatment options. I recommended for the patient to send the tissue block to [facility] for molecular studies and PDL 1 expression. The patient understand that he has incurable condition and all the treatment  will be of palliative nature. 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 I discussed his treatment plan with Dr. 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 I will consider the patient for a course of concurrent chemoradiation with weekly carboplatin for AUC of 2 and paclitaxel 45 mg/M2 during his radiation. I discussed with the patient the adverse effect of this treatment including but not limited to alopecia, myelosuppression, nausea and vomiting, peripheral neuropathy, liver or renal dysfunction. I will arrange for the patient to come back for follow-up visit in 2 weeks for evaluation and management of any adverse effect of his treatment.  I will call his pharmacy with prescription for Compazine 10 mg p.o. every 6 hours as needed for nausea. He was advised to call immediately if he has any concerning symptoms in the interval. The patient voices understanding of current disease status and treatment options and is in agreement with the current care plan.  All questions were answered. The patient knows to call the clinic with any problems, questions or concerns. We can certainly see the patient much sooner if necessary.  I spent 15 minutes counseling the patient face to face. The total time spent in the appointment was 25 minutes.  Disclaimer: This note was dictated with voice recognition software. Similar sounding words can inadvertently be transcribed and may not be corrected upon review.

## 2018-08-19 NOTE — Progress Notes (Signed)
START ON PATHWAY REGIMEN - Non-Small Cell Lung     Administer weekly:     Paclitaxel      Carboplatin   **Always confirm dose/schedule in your pharmacy ordering system**  Patient Characteristics: Stage III - Unresectable, PS = 0, 1 AJCC T Category: T2b Current Disease Status: No Distant Mets or Local Recurrence AJCC N Category: N2 AJCC M Category: M0 AJCC 8 Stage Grouping: IIIA ECOG Performance Status: 1 Intent of Therapy: Non-Curative / Palliative Intent, Discussed with Patient

## 2018-08-20 ENCOUNTER — Other Ambulatory Visit: Payer: Self-pay | Admitting: *Deleted

## 2018-08-20 ENCOUNTER — Other Ambulatory Visit: Payer: Self-pay

## 2018-08-20 ENCOUNTER — Ambulatory Visit
Admission: RE | Admit: 2018-08-20 | Discharge: 2018-08-20 | Disposition: A | Discharge status: Home / Self Care | Payer: Medicare Other | Source: Ambulatory Visit | Attending: Radiation Oncology | Admitting: Radiation Oncology

## 2018-08-20 ENCOUNTER — Telehealth: Payer: Self-pay | Admitting: *Deleted

## 2018-08-20 ENCOUNTER — Telehealth: Payer: Self-pay | Admitting: Internal Medicine

## 2018-08-20 DIAGNOSIS — C3491 Malignant neoplasm of unspecified part of right bronchus or lung: Secondary | ICD-10-CM

## 2018-08-20 MED ORDER — PROCHLORPERAZINE MALEATE 10 MG PO TABS: 10.0000 mg | ORAL_TABLET | Freq: Four times a day (QID) | ORAL | 1 refills | Status: AC | PRN

## 2018-08-20 NOTE — Telephone Encounter (Signed)
Prescription for compazine sent in per order Dr. Julien Nordmann. Call made to wife to notify her of prescription. She voiced understanding

## 2018-08-20 NOTE — Telephone Encounter (Signed)
Received call from pt's wife. She states pt is experiencing some difficulty/painful swallowing and nausea. Pt is currently receiving radiation treatments but has not started chemotherapy yet. Chemo to start next week. Advised to try warm salt water gargle and soft foods. Instructed wife to discuss swallowing concerns with Dr. Sondra Come. Will check with Dr, Julien Nordmann re: medications for nausea. None listed on his med list.  Please advise.

## 2018-08-20 NOTE — Telephone Encounter (Signed)
Compazine 10 mg q 6 hours pen #[phone]

## 2018-08-20 NOTE — Telephone Encounter (Signed)
Called regarding schedule °

## 2018-08-23 ENCOUNTER — Ambulatory Visit
Admission: RE | Admit: 2018-08-23 | Discharge: 2018-08-23 | Disposition: A | Discharge status: Home / Self Care | Payer: Medicare Other | Source: Ambulatory Visit | Attending: Radiation Oncology | Admitting: Radiation Oncology

## 2018-08-23 ENCOUNTER — Inpatient Hospital Stay: Payer: Medicare Other

## 2018-08-23 ENCOUNTER — Telehealth: Payer: Self-pay | Admitting: *Deleted

## 2018-08-23 ENCOUNTER — Other Ambulatory Visit: Payer: Self-pay

## 2018-08-23 NOTE — Telephone Encounter (Signed)
Called pt & spoke with wife & asked if OK for pt to p/u up patient education packet while here for radiation & then Education RN to call & do patient ed over the phone.  Wife & pt in agreement that this is OK.  Binder with education materials left at front desk for p/u when he comes in for radiation this am. Will call pt later.

## 2018-08-24 ENCOUNTER — Other Ambulatory Visit: Payer: Self-pay

## 2018-08-24 ENCOUNTER — Ambulatory Visit
Admission: RE | Admit: 2018-08-24 | Discharge: 2018-08-24 | Disposition: A | Discharge status: Home / Self Care | Payer: Medicare Other | Source: Ambulatory Visit | Attending: Radiation Oncology | Admitting: Radiation Oncology

## 2018-08-24 ENCOUNTER — Other Ambulatory Visit: Payer: Self-pay | Admitting: Internal Medicine

## 2018-08-24 ENCOUNTER — Inpatient Hospital Stay: Payer: Medicare Other

## 2018-08-24 VITALS — BP 116/89 | HR 79 | Temp 97.8°F | Resp 18 | Wt 176.5 lb

## 2018-08-24 DIAGNOSIS — Z5111 Encounter for antineoplastic chemotherapy: Secondary | ICD-10-CM | POA: Diagnosis not present

## 2018-08-24 DIAGNOSIS — C3491 Malignant neoplasm of unspecified part of right bronchus or lung: Secondary | ICD-10-CM

## 2018-08-24 LAB — CMP (CANCER CENTER ONLY)
ALT: 15 U/L (ref 0–44)
AST: 15 U/L (ref 15–41)
Albumin: 3.6 g/dL (ref 3.5–5.0)
Alkaline Phosphatase: 63 U/L (ref 38–126)
Anion gap: 12 (ref 5–15)
BUN: 11 mg/dL (ref 8–23)
CO2: 20 mmol/L — ABNORMAL LOW (ref 22–32)
Calcium: 9.2 mg/dL (ref 8.9–10.3)
Chloride: 104 mmol/L (ref 98–111)
Creatinine: 1.06 mg/dL (ref 0.61–1.24)
GFR, Est AFR Am: >60 mL/min (ref >60)
GFR, Estimated: >60 mL/min (ref >60)
Glucose, Bld: 196 mg/dL — ABNORMAL HIGH (ref 70–99)
Potassium: 4.3 mmol/L (ref 3.5–5.1)
Sodium: 136 mmol/L (ref 135–145)
Total Bilirubin: 1.2 mg/dL (ref 0.3–1.2)
Total Protein: 7 g/dL (ref 6.5–8.1)

## 2018-08-24 LAB — CBC WITH DIFFERENTIAL (CANCER CENTER ONLY)
Abs Immature Granulocytes: 0.02 10*3/uL (ref 0.00–0.07)
Basophils Absolute: 0.1 10*3/uL (ref 0.0–0.1)
Basophils Relative: 1 %
Eosinophils Absolute: 0.2 10*3/uL (ref 0.0–0.5)
Eosinophils Relative: 2 %
HCT: 43.1 % (ref 39.0–52.0)
Hemoglobin: 14.9 g/dL (ref 13.0–17.0)
Immature Granulocytes: 0 %
Lymphocytes Relative: 13 %
Lymphs Abs: 1.1 10*3/uL (ref 0.7–4.0)
MCH: 31.6 pg (ref 26.0–34.0)
MCHC: 34.6 g/dL (ref 30.0–36.0)
MCV: 91.3 fL (ref 80.0–100.0)
Monocytes Absolute: 0.9 10*3/uL (ref 0.1–1.0)
Monocytes Relative: 11 %
Neutro Abs: 6.1 10*3/uL (ref 1.7–7.7)
Neutrophils Relative %: 73 %
Platelet Count: 189 10*3/uL (ref 150–400)
RBC: 4.72 MIL/uL (ref 4.22–5.81)
RDW: 13.1 % (ref 11.5–15.5)
WBC Count: 8.3 10*3/uL (ref 4.0–10.5)
nRBC: 0 % (ref 0.0–0.2)

## 2018-08-24 MED ORDER — SODIUM CHLORIDE 0.9 % IV SOLN
Freq: Once | INTRAVENOUS | Status: AC
  Administered 2018-08-24: 10:00:00 via INTRAVENOUS
  Filled 2018-08-24: qty 250

## 2018-08-24 MED ORDER — DIPHENHYDRAMINE HCL 50 MG/ML IJ SOLN
INTRAMUSCULAR | Status: AC
  Filled 2018-08-24: qty 1

## 2018-08-24 MED ORDER — SODIUM CHLORIDE 0.9 % IV SOLN
20.0000 mg | Freq: Once | INTRAVENOUS | Status: AC
  Administered 2018-08-24: 20 mg via INTRAVENOUS
  Filled 2018-08-24: qty 2

## 2018-08-24 MED ORDER — DIPHENHYDRAMINE HCL 50 MG/ML IJ SOLN
50.0000 mg | Freq: Once | INTRAMUSCULAR | Status: AC
  Administered 2018-08-24: 10:00:00 50 mg via INTRAVENOUS

## 2018-08-24 MED ORDER — SODIUM CHLORIDE 0.9 % IV SOLN
20.0000 mg | Freq: Once | INTRAVENOUS | Status: AC
  Administered 2018-08-24: 10:00:00 20 mg via INTRAVENOUS
  Filled 2018-08-24: qty 20

## 2018-08-24 MED ORDER — SODIUM CHLORIDE 0.9 % IV SOLN
45.0000 mg/m2 | Freq: Once | INTRAVENOUS | Status: AC
  Administered 2018-08-24: 90 mg via INTRAVENOUS
  Filled 2018-08-24: qty 15

## 2018-08-24 MED ORDER — PALONOSETRON HCL INJECTION 0.25 MG/5ML
INTRAVENOUS | Status: AC
  Filled 2018-08-24: qty 5

## 2018-08-24 MED ORDER — PALONOSETRON HCL INJECTION 0.25 MG/5ML
0.2500 mg | Freq: Once | INTRAVENOUS | Status: AC
  Administered 2018-08-24: 0.25 mg via INTRAVENOUS

## 2018-08-24 MED ORDER — SODIUM CHLORIDE 0.9 % IV SOLN
205.0000 mg | Freq: Once | INTRAVENOUS | Status: AC
  Administered 2018-08-24: 210 mg via INTRAVENOUS
  Filled 2018-08-24: qty 21

## 2018-08-24 NOTE — Patient Instructions (Signed)
[facility] Discharge Instructions for Patients Receiving Chemotherapy  Today you received the following chemotherapy agents: Taxol and Carboplatin.  To help prevent nausea and vomiting after your treatment, we encourage you to take your nausea medication as directed. If you develop nausea and vomiting that is not controlled by your nausea medication, call the clinic.   BELOW ARE SYMPTOMS THAT SHOULD BE REPORTED IMMEDIATELY:  *FEVER GREATER THAN 100.5 F  *CHILLS WITH OR WITHOUT FEVER  NAUSEA AND VOMITING THAT IS NOT CONTROLLED WITH YOUR NAUSEA MEDICATION  *UNUSUAL SHORTNESS OF BREATH  *UNUSUAL BRUISING OR BLEEDING  TENDERNESS IN MOUTH AND THROAT WITH OR WITHOUT PRESENCE OF ULCERS  *URINARY PROBLEMS  *BOWEL PROBLEMS  UNUSUAL RASH Items with * indicate a potential emergency and should be followed up as soon as possible.  Feel free to call the clinic should you have any questions or concerns. The clinic phone number is [phone].  Please show the [facility] at check-in to the Emergency Department and triage nurse.  Paclitaxel injection What is this medicine? PACLITAXEL (PAK li TAX el) is a chemotherapy drug. It targets fast dividing cells, like cancer cells, and causes these cells to die. This medicine is used to treat ovarian cancer, breast cancer, lung cancer, Kaposi's sarcoma, and other cancers. This medicine may be used for other purposes; ask your health care provider or pharmacist if you have questions. COMMON BRAND NAME(S): Onxol, Taxol What should I tell my health care provider before I take this medicine? They need to know if you have any of these conditions: -history of irregular heartbeat -liver disease -low blood counts, like low white cell, platelet, or red cell counts -lung or breathing disease, like asthma -tingling of the fingers or toes, or other nerve disorder -an unusual or allergic reaction to paclitaxel, alcohol, polyoxyethylated  castor oil, other chemotherapy, other medicines, foods, dyes, or preservatives -pregnant or trying to get pregnant -breast-feeding How should I use this medicine? This drug is given as an infusion into a vein. It is administered in a hospital or clinic by a specially trained health care professional. Talk to your pediatrician regarding the use of this medicine in children. Special care may be needed. Overdosage: If you think you have taken too much of this medicine contact a poison control center or emergency room at once. NOTE: This medicine is only for you. Do not share this medicine with others. What if I miss a dose? It is important not to miss your dose. Call your doctor or health care professional if you are unable to keep an appointment. What may interact with this medicine? Do not take this medicine with any of the following medications: -disulfiram -metronidazole This medicine may also interact with the following medications: -antiviral medicines for hepatitis, HIV or AIDS -certain antibiotics like erythromycin and clarithromycin -certain medicines for fungal infections like ketoconazole and itraconazole -certain medicines for seizures like carbamazepine, phenobarbital, phenytoin -gemfibrozil -nefazodone -rifampin -St. John's wort This list may not describe all possible interactions. Give your health care provider a list of all the medicines, herbs, non-prescription drugs, or dietary supplements you use. Also tell them if you smoke, drink alcohol, or use illegal drugs. Some items may interact with your medicine. What should I watch for while using this medicine? Your condition will be monitored carefully while you are receiving this medicine. You will need important blood work done while you are taking this medicine. This medicine can cause serious allergic reactions. To reduce your risk  you will need to take other medicine(s) before treatment with this medicine. If you experience  allergic reactions like skin rash, itching or hives, swelling of the face, lips, or tongue, tell your doctor or health care professional right away. In some cases, you may be given additional medicines to help with side effects. Follow all directions for their use. This drug may make you feel generally unwell. This is not uncommon, as chemotherapy can affect healthy cells as well as cancer cells. Report any side effects. Continue your course of treatment even though you feel ill unless your doctor tells you to stop. Call your doctor or health care professional for advice if you get a fever, chills or sore throat, or other symptoms of a cold or flu. Do not treat yourself. This drug decreases your body's ability to fight infections. Try to avoid being around people who are sick. This medicine may increase your risk to bruise or bleed. Call your doctor or health care professional if you notice any unusual bleeding. Be careful brushing and flossing your teeth or using a toothpick because you may get an infection or bleed more easily. If you have any dental work done, tell your dentist you are receiving this medicine. Avoid taking products that contain aspirin, acetaminophen, ibuprofen, naproxen, or ketoprofen unless instructed by your doctor. These medicines may hide a fever. Do not become pregnant while taking this medicine. Women should inform their doctor if they wish to become pregnant or think they might be pregnant. There is a potential for serious side effects to an unborn child. Talk to your health care professional or pharmacist for more information. Do not breast-feed an infant while taking this medicine. Men are advised not to father a child while receiving this medicine. This product may contain alcohol. Ask your pharmacist or healthcare provider if this medicine contains alcohol. Be sure to tell all healthcare providers you are taking this medicine. Certain medicines, like metronidazole and  disulfiram, can cause an unpleasant reaction when taken with alcohol. The reaction includes flushing, headache, nausea, vomiting, sweating, and increased thirst. The reaction can last from 30 minutes to several hours. What side effects may I notice from receiving this medicine? Side effects that you should report to your doctor or health care professional as soon as possible: -allergic reactions like skin rash, itching or hives, swelling of the face, lips, or tongue -breathing problems -changes in vision -fast, irregular heartbeat -high or low blood pressure -mouth sores -pain, tingling, numbness in the hands or feet -signs of decreased platelets or bleeding - bruising, pinpoint red spots on the skin, black, tarry stools, blood in the urine -signs of decreased red blood cells - unusually weak or tired, feeling faint or lightheaded, falls -signs of infection - fever or chills, cough, sore throat, pain or difficulty passing urine -signs and symptoms of liver injury like dark yellow or brown urine; general ill feeling or flu-like symptoms; light-colored stools; loss of appetite; nausea; right upper belly pain; unusually weak or tired; yellowing of the eyes or skin -swelling of the ankles, feet, hands -unusually slow heartbeat Side effects that usually do not require medical attention (report to your doctor or health care professional if they continue or are bothersome): -diarrhea -hair loss -loss of appetite -muscle or joint pain -nausea, vomiting -pain, redness, or irritation at site where injected -tiredness This list may not describe all possible side effects. Call your doctor for medical advice about side effects. You may report side effects to FDA  at 1-800-FDA-1088. Where should I keep my medicine? This drug is given in a hospital or clinic and will not be stored at home. NOTE: This sheet is a summary. It may not cover all possible information. If you have questions about this medicine,  talk to your doctor, pharmacist, or health care provider.  2019 Elsevier/Gold Standard (2016-12-23 13:14:55) Carboplatin injection What is this medicine? CARBOPLATIN (KAR boe pla tin) is a chemotherapy drug. It targets fast dividing cells, like cancer cells, and causes these cells to die. This medicine is used to treat ovarian cancer and many other cancers. This medicine may be used for other purposes; ask your health care provider or pharmacist if you have questions. COMMON BRAND NAME(S): Paraplatin What should I tell my health care provider before I take this medicine? They need to know if you have any of these conditions: -blood disorders -hearing problems -kidney disease -recent or ongoing radiation therapy -an unusual or allergic reaction to carboplatin, cisplatin, other chemotherapy, other medicines, foods, dyes, or preservatives -pregnant or trying to get pregnant -breast-feeding How should I use this medicine? This drug is usually given as an infusion into a vein. It is administered in a hospital or clinic by a specially trained health care professional. Talk to your pediatrician regarding the use of this medicine in children. Special care may be needed. Overdosage: If you think you have taken too much of this medicine contact a poison control center or emergency room at once. NOTE: This medicine is only for you. Do not share this medicine with others. What if I miss a dose? It is important not to miss a dose. Call your doctor or health care professional if you are unable to keep an appointment. What may interact with this medicine? -medicines for seizures -medicines to increase blood counts like filgrastim, pegfilgrastim, sargramostim -some antibiotics like amikacin, gentamicin, neomycin, streptomycin, tobramycin -vaccines Talk to your doctor or health care professional before taking any of these medicines: -acetaminophen -aspirin -ibuprofen -ketoprofen -naproxen This list  may not describe all possible interactions. Give your health care provider a list of all the medicines, herbs, non-prescription drugs, or dietary supplements you use. Also tell them if you smoke, drink alcohol, or use illegal drugs. Some items may interact with your medicine. What should I watch for while using this medicine? Your condition will be monitored carefully while you are receiving this medicine. You will need important blood work done while you are taking this medicine. This drug may make you feel generally unwell. This is not uncommon, as chemotherapy can affect healthy cells as well as cancer cells. Report any side effects. Continue your course of treatment even though you feel ill unless your doctor tells you to stop. In some cases, you may be given additional medicines to help with side effects. Follow all directions for their use. Call your doctor or health care professional for advice if you get a fever, chills or sore throat, or other symptoms of a cold or flu. Do not treat yourself. This drug decreases your body's ability to fight infections. Try to avoid being around people who are sick. This medicine may increase your risk to bruise or bleed. Call your doctor or health care professional if you notice any unusual bleeding. Be careful brushing and flossing your teeth or using a toothpick because you may get an infection or bleed more easily. If you have any dental work done, tell your dentist you are receiving this medicine. Avoid taking products that contain aspirin,  acetaminophen, ibuprofen, naproxen, or ketoprofen unless instructed by your doctor. These medicines may hide a fever. Do not become pregnant while taking this medicine. Women should inform their doctor if they wish to become pregnant or think they might be pregnant. There is a potential for serious side effects to an unborn child. Talk to your health care professional or pharmacist for more information. Do not breast-feed an  infant while taking this medicine. What side effects may I notice from receiving this medicine? Side effects that you should report to your doctor or health care professional as soon as possible: -allergic reactions like skin rash, itching or hives, swelling of the face, lips, or tongue -signs of infection - fever or chills, cough, sore throat, pain or difficulty passing urine -signs of decreased platelets or bleeding - bruising, pinpoint red spots on the skin, black, tarry stools, nosebleeds -signs of decreased red blood cells - unusually weak or tired, fainting spells, lightheadedness -breathing problems -changes in hearing -changes in vision -chest pain -high blood pressure -low blood counts - This drug may decrease the number of white blood cells, red blood cells and platelets. You may be at increased risk for infections and bleeding. -nausea and vomiting -pain, swelling, redness or irritation at the injection site -pain, tingling, numbness in the hands or feet -problems with balance, talking, walking -trouble passing urine or change in the amount of urine Side effects that usually do not require medical attention (report to your doctor or health care professional if they continue or are bothersome): -hair loss -loss of appetite -metallic taste in the mouth or changes in taste This list may not describe all possible side effects. Call your doctor for medical advice about side effects. You may report side effects to FDA at 1-800-FDA-1088. Where should I keep my medicine? This drug is given in a hospital or clinic and will not be stored at home. NOTE: This sheet is a summary. It may not cover all possible information. If you have questions about this medicine, talk to your doctor, pharmacist, or health care provider.  2019 Elsevier/Gold Standard (2007-07-27 14:38:05) This information is directly available on the CDC website:  RunningShows.co.za.html    Source:CDC Reference to specific commercial products, manufacturers, companies, or trademarks does not constitute its endorsement or recommendation by the [place], [place], or Centers for [employer] and [employer].

## 2018-08-25 ENCOUNTER — Ambulatory Visit: Payer: Medicare Other

## 2018-08-26 ENCOUNTER — Telehealth: Payer: Self-pay | Admitting: *Deleted

## 2018-08-26 ENCOUNTER — Ambulatory Visit
Admission: RE | Admit: 2018-08-26 | Discharge: 2018-08-26 | Disposition: A | Discharge status: Home / Self Care | Payer: Medicare Other | Source: Ambulatory Visit | Attending: Radiation Oncology | Admitting: Radiation Oncology

## 2018-08-26 ENCOUNTER — Other Ambulatory Visit: Payer: Self-pay

## 2018-08-26 NOTE — Telephone Encounter (Signed)
-----   Message from Flo Shanks, RN sent at 08/24/2018 12:21 PM EDT ----- Regarding: Dr. Julien Nordmann First time Chemo Please call and follow up after first treatment. Thank you!

## 2018-08-26 NOTE — Telephone Encounter (Addendum)
Called Georjean Mode for chemotherapy F/U.  Patient is doing well.  Denies n/v.  Denies any new side effects or symptoms.  Bowel and bladder functioning well.  "My LBM was this morning."  Eating and drinking well.  Instructed to drink 64 oz minimum daily or at least the day before, of and after treatment.   Denies questions or needs at this time.   Reports:  "My throat bothers me.  Pain started before chemotherapy.  Comes and goes to mid chest, hurts after swallowing cold beverages and coke.  No, it's not the area being radiated.  Not a sore throat, I feel need to clear throat like getting hoarse.  Dr. Sondra Come said there is something that could coat my throat but I'm using Biotene, warm salt water gargles and good mouth care.  I may ask Dr. Sondra Come to order this when I see him again next Tuesday."  Encouraged to call [phone] Mon -Fri 8:00 am - 4:30 pm or anytime as needed for symptoms, changes or event outside office hours.

## 2018-08-27 ENCOUNTER — Ambulatory Visit
Admission: RE | Admit: 2018-08-27 | Discharge: 2018-08-27 | Disposition: A | Discharge status: Home / Self Care | Payer: Medicare Other | Source: Ambulatory Visit | Attending: Radiation Oncology | Admitting: Radiation Oncology

## 2018-08-27 ENCOUNTER — Other Ambulatory Visit: Payer: Self-pay

## 2018-08-27 NOTE — Telephone Encounter (Signed)
Note forwarded to Dr Sondra Come and Sharee Pimple, P.

## 2018-08-30 ENCOUNTER — Encounter: Payer: Self-pay | Admitting: Internal Medicine

## 2018-08-30 ENCOUNTER — Inpatient Hospital Stay (HOSPITAL_BASED_OUTPATIENT_CLINIC_OR_DEPARTMENT_OTHER): Payer: Medicare Other | Admitting: Internal Medicine

## 2018-08-30 ENCOUNTER — Inpatient Hospital Stay: Payer: Medicare Other

## 2018-08-30 ENCOUNTER — Ambulatory Visit: Payer: Medicare Other

## 2018-08-30 ENCOUNTER — Other Ambulatory Visit: Payer: Self-pay | Admitting: Radiation Oncology

## 2018-08-30 ENCOUNTER — Other Ambulatory Visit: Payer: Self-pay

## 2018-08-30 ENCOUNTER — Ambulatory Visit
Admission: RE | Admit: 2018-08-30 | Discharge: 2018-08-30 | Disposition: A | Discharge status: Home / Self Care | Payer: Medicare Other | Source: Ambulatory Visit | Attending: Radiation Oncology | Admitting: Radiation Oncology

## 2018-08-30 VITALS — BP 131/89 | HR 65 | Temp 97.6°F | Resp 18 | Ht 69.0 in | Wt 177.7 lb

## 2018-08-30 DIAGNOSIS — R1310 Dysphagia, unspecified: Secondary | ICD-10-CM | POA: Diagnosis not present

## 2018-08-30 DIAGNOSIS — C3431 Malignant neoplasm of lower lobe, right bronchus or lung: Secondary | ICD-10-CM

## 2018-08-30 DIAGNOSIS — C3491 Malignant neoplasm of unspecified part of right bronchus or lung: Secondary | ICD-10-CM

## 2018-08-30 DIAGNOSIS — Z5111 Encounter for antineoplastic chemotherapy: Secondary | ICD-10-CM | POA: Diagnosis not present

## 2018-08-30 DIAGNOSIS — C7951 Secondary malignant neoplasm of bone: Secondary | ICD-10-CM | POA: Diagnosis not present

## 2018-08-30 DIAGNOSIS — R079 Chest pain, unspecified: Secondary | ICD-10-CM

## 2018-08-30 LAB — CMP (CANCER CENTER ONLY)
ALT: 28 U/L (ref 0–44)
AST: 24 U/L (ref 15–41)
Albumin: 3.1 g/dL — ABNORMAL LOW (ref 3.5–5.0)
Alkaline Phosphatase: 68 U/L (ref 38–126)
Anion gap: 9 (ref 5–15)
BUN: 15 mg/dL (ref 8–23)
CO2: 23 mmol/L (ref 22–32)
Calcium: 8.4 mg/dL — ABNORMAL LOW (ref 8.9–10.3)
Chloride: 105 mmol/L (ref 98–111)
Creatinine: 0.85 mg/dL (ref 0.61–1.24)
GFR, Est AFR Am: >60 mL/min (ref >60)
GFR, Estimated: >60 mL/min (ref >60)
Glucose, Bld: 127 mg/dL — ABNORMAL HIGH (ref 70–99)
Potassium: 4.2 mmol/L (ref 3.5–5.1)
Sodium: 137 mmol/L (ref 135–145)
Total Bilirubin: 1 mg/dL (ref 0.3–1.2)
Total Protein: 6.3 g/dL — ABNORMAL LOW (ref 6.5–8.1)

## 2018-08-30 LAB — CBC WITH DIFFERENTIAL (CANCER CENTER ONLY)
Abs Immature Granulocytes: 0.05 10*3/uL (ref 0.00–0.07)
Basophils Absolute: 0 10*3/uL (ref 0.0–0.1)
Basophils Relative: 1 %
Eosinophils Absolute: 0.1 10*3/uL (ref 0.0–0.5)
Eosinophils Relative: 3 %
HCT: 40.4 % (ref 39.0–52.0)
Hemoglobin: 13.4 g/dL (ref 13.0–17.0)
Immature Granulocytes: 1 %
Lymphocytes Relative: 18 %
Lymphs Abs: 0.7 10*3/uL (ref 0.7–4.0)
MCH: 31.7 pg (ref 26.0–34.0)
MCHC: 33.2 g/dL (ref 30.0–36.0)
MCV: 95.5 fL (ref 80.0–100.0)
Monocytes Absolute: 0.2 10*3/uL (ref 0.1–1.0)
Monocytes Relative: 6 %
Neutro Abs: 2.9 10*3/uL (ref 1.7–7.7)
Neutrophils Relative %: 71 %
Platelet Count: 166 10*3/uL (ref 150–400)
RBC: 4.23 MIL/uL (ref 4.22–5.81)
RDW: 13.3 % (ref 11.5–15.5)
WBC Count: 4 10*3/uL (ref 4.0–10.5)
nRBC: 0 % (ref 0.0–0.2)

## 2018-08-30 MED ORDER — SODIUM CHLORIDE 0.9 % IV SOLN
Freq: Once | INTRAVENOUS | Status: AC
  Administered 2018-08-30: 11:00:00 via INTRAVENOUS
  Filled 2018-08-30: qty 250

## 2018-08-30 MED ORDER — SODIUM CHLORIDE 0.9 % IV SOLN
45.0000 mg/m2 | Freq: Once | INTRAVENOUS | Status: AC
  Administered 2018-08-30: 90 mg via INTRAVENOUS
  Filled 2018-08-30: qty 15

## 2018-08-30 MED ORDER — SODIUM CHLORIDE 0.9 % IV SOLN
205.0000 mg | Freq: Once | INTRAVENOUS | Status: AC
  Administered 2018-08-30: 210 mg via INTRAVENOUS
  Filled 2018-08-30: qty 21

## 2018-08-30 MED ORDER — DIPHENHYDRAMINE HCL 50 MG/ML IJ SOLN
INTRAMUSCULAR | Status: AC
  Filled 2018-08-30: qty 1

## 2018-08-30 MED ORDER — FAMOTIDINE IN NACL 20-0.9 MG/50ML-% IV SOLN
20.0000 mg | Freq: Once | INTRAVENOUS | Status: AC
  Administered 2018-08-30: 20 mg via INTRAVENOUS

## 2018-08-30 MED ORDER — PALONOSETRON HCL INJECTION 0.25 MG/5ML
0.2500 mg | Freq: Once | INTRAVENOUS | Status: AC
  Administered 2018-08-30: 0.25 mg via INTRAVENOUS

## 2018-08-30 MED ORDER — PALONOSETRON HCL INJECTION 0.25 MG/5ML
INTRAVENOUS | Status: AC
  Filled 2018-08-30: qty 5

## 2018-08-30 MED ORDER — FAMOTIDINE IN NACL 20-0.9 MG/50ML-% IV SOLN
INTRAVENOUS | Status: AC
  Filled 2018-08-30: qty 50

## 2018-08-30 MED ORDER — DIPHENHYDRAMINE HCL 50 MG/ML IJ SOLN
50.0000 mg | Freq: Once | INTRAMUSCULAR | Status: AC
  Administered 2018-08-30: 11:00:00 50 mg via INTRAVENOUS

## 2018-08-30 MED ORDER — SUCRALFATE 1 G PO TABS: 1.0000 g | ORAL_TABLET | Freq: Three times a day (TID) | ORAL | 1 refills | Status: DC

## 2018-08-30 MED ORDER — SODIUM CHLORIDE 0.9 % IV SOLN
20.0000 mg | Freq: Once | INTRAVENOUS | Status: AC
  Administered 2018-08-30: 12:00:00 20 mg via INTRAVENOUS
  Filled 2018-08-30: qty 20

## 2018-08-30 NOTE — Patient Instructions (Signed)
[facility] Discharge Instructions for Patients Receiving Chemotherapy  Today you received the following chemotherapy agents: Taxol and Carboplatin.  To help prevent nausea and vomiting after your treatment, we encourage you to take your nausea medication as directed. If you develop nausea and vomiting that is not controlled by your nausea medication, call the clinic.   BELOW ARE SYMPTOMS THAT SHOULD BE REPORTED IMMEDIATELY:  *FEVER GREATER THAN 100.5 F  *CHILLS WITH OR WITHOUT FEVER  NAUSEA AND VOMITING THAT IS NOT CONTROLLED WITH YOUR NAUSEA MEDICATION  *UNUSUAL SHORTNESS OF BREATH  *UNUSUAL BRUISING OR BLEEDING  TENDERNESS IN MOUTH AND THROAT WITH OR WITHOUT PRESENCE OF ULCERS  *URINARY PROBLEMS  *BOWEL PROBLEMS  UNUSUAL RASH Items with * indicate a potential emergency and should be followed up as soon as possible.  Feel free to call the clinic should you have any questions or concerns. The clinic phone number is [phone].  Please show the [facility] at check-in to the Emergency Department and triage nurse.  Paclitaxel injection What is this medicine? PACLITAXEL (PAK li TAX el) is a chemotherapy drug. It targets fast dividing cells, like cancer cells, and causes these cells to die. This medicine is used to treat ovarian cancer, breast cancer, lung cancer, Kaposi's sarcoma, and other cancers. This medicine may be used for other purposes; ask your health care provider or pharmacist if you have questions. COMMON BRAND NAME(S): Onxol, Taxol What should I tell my health care provider before I take this medicine? They need to know if you have any of these conditions: -history of irregular heartbeat -liver disease -low blood counts, like low white cell, platelet, or red cell counts -lung or breathing disease, like asthma -tingling of the fingers or toes, or other nerve disorder -an unusual or allergic reaction to paclitaxel, alcohol, polyoxyethylated  castor oil, other chemotherapy, other medicines, foods, dyes, or preservatives -pregnant or trying to get pregnant -breast-feeding How should I use this medicine? This drug is given as an infusion into a vein. It is administered in a hospital or clinic by a specially trained health care professional. Talk to your pediatrician regarding the use of this medicine in children. Special care may be needed. Overdosage: If you think you have taken too much of this medicine contact a poison control center or emergency room at once. NOTE: This medicine is only for you. Do not share this medicine with others. What if I miss a dose? It is important not to miss your dose. Call your doctor or health care professional if you are unable to keep an appointment. What may interact with this medicine? Do not take this medicine with any of the following medications: -disulfiram -metronidazole This medicine may also interact with the following medications: -antiviral medicines for hepatitis, HIV or AIDS -certain antibiotics like erythromycin and clarithromycin -certain medicines for fungal infections like ketoconazole and itraconazole -certain medicines for seizures like carbamazepine, phenobarbital, phenytoin -gemfibrozil -nefazodone -rifampin -St. John's wort This list may not describe all possible interactions. Give your health care provider a list of all the medicines, herbs, non-prescription drugs, or dietary supplements you use. Also tell them if you smoke, drink alcohol, or use illegal drugs. Some items may interact with your medicine. What should I watch for while using this medicine? Your condition will be monitored carefully while you are receiving this medicine. You will need important blood work done while you are taking this medicine. This medicine can cause serious allergic reactions. To reduce your risk  you will need to take other medicine(s) before treatment with this medicine. If you experience  allergic reactions like skin rash, itching or hives, swelling of the face, lips, or tongue, tell your doctor or health care professional right away. In some cases, you may be given additional medicines to help with side effects. Follow all directions for their use. This drug may make you feel generally unwell. This is not uncommon, as chemotherapy can affect healthy cells as well as cancer cells. Report any side effects. Continue your course of treatment even though you feel ill unless your doctor tells you to stop. Call your doctor or health care professional for advice if you get a fever, chills or sore throat, or other symptoms of a cold or flu. Do not treat yourself. This drug decreases your body's ability to fight infections. Try to avoid being around people who are sick. This medicine may increase your risk to bruise or bleed. Call your doctor or health care professional if you notice any unusual bleeding. Be careful brushing and flossing your teeth or using a toothpick because you may get an infection or bleed more easily. If you have any dental work done, tell your dentist you are receiving this medicine. Avoid taking products that contain aspirin, acetaminophen, ibuprofen, naproxen, or ketoprofen unless instructed by your doctor. These medicines may hide a fever. Do not become pregnant while taking this medicine. Women should inform their doctor if they wish to become pregnant or think they might be pregnant. There is a potential for serious side effects to an unborn child. Talk to your health care professional or pharmacist for more information. Do not breast-feed an infant while taking this medicine. Men are advised not to father a child while receiving this medicine. This product may contain alcohol. Ask your pharmacist or healthcare provider if this medicine contains alcohol. Be sure to tell all healthcare providers you are taking this medicine. Certain medicines, like metronidazole and  disulfiram, can cause an unpleasant reaction when taken with alcohol. The reaction includes flushing, headache, nausea, vomiting, sweating, and increased thirst. The reaction can last from 30 minutes to several hours. What side effects may I notice from receiving this medicine? Side effects that you should report to your doctor or health care professional as soon as possible: -allergic reactions like skin rash, itching or hives, swelling of the face, lips, or tongue -breathing problems -changes in vision -fast, irregular heartbeat -high or low blood pressure -mouth sores -pain, tingling, numbness in the hands or feet -signs of decreased platelets or bleeding - bruising, pinpoint red spots on the skin, black, tarry stools, blood in the urine -signs of decreased red blood cells - unusually weak or tired, feeling faint or lightheaded, falls -signs of infection - fever or chills, cough, sore throat, pain or difficulty passing urine -signs and symptoms of liver injury like dark yellow or brown urine; general ill feeling or flu-like symptoms; light-colored stools; loss of appetite; nausea; right upper belly pain; unusually weak or tired; yellowing of the eyes or skin -swelling of the ankles, feet, hands -unusually slow heartbeat Side effects that usually do not require medical attention (report to your doctor or health care professional if they continue or are bothersome): -diarrhea -hair loss -loss of appetite -muscle or joint pain -nausea, vomiting -pain, redness, or irritation at site where injected -tiredness This list may not describe all possible side effects. Call your doctor for medical advice about side effects. You may report side effects to FDA  at 1-800-FDA-1088. Where should I keep my medicine? This drug is given in a hospital or clinic and will not be stored at home. NOTE: This sheet is a summary. It may not cover all possible information. If you have questions about this medicine,  talk to your doctor, pharmacist, or health care provider.  2019 Elsevier/Gold Standard (2016-12-23 13:14:55) Carboplatin injection What is this medicine? CARBOPLATIN (KAR boe pla tin) is a chemotherapy drug. It targets fast dividing cells, like cancer cells, and causes these cells to die. This medicine is used to treat ovarian cancer and many other cancers. This medicine may be used for other purposes; ask your health care provider or pharmacist if you have questions. COMMON BRAND NAME(S): Paraplatin What should I tell my health care provider before I take this medicine? They need to know if you have any of these conditions: -blood disorders -hearing problems -kidney disease -recent or ongoing radiation therapy -an unusual or allergic reaction to carboplatin, cisplatin, other chemotherapy, other medicines, foods, dyes, or preservatives -pregnant or trying to get pregnant -breast-feeding How should I use this medicine? This drug is usually given as an infusion into a vein. It is administered in a hospital or clinic by a specially trained health care professional. Talk to your pediatrician regarding the use of this medicine in children. Special care may be needed. Overdosage: If you think you have taken too much of this medicine contact a poison control center or emergency room at once. NOTE: This medicine is only for you. Do not share this medicine with others. What if I miss a dose? It is important not to miss a dose. Call your doctor or health care professional if you are unable to keep an appointment. What may interact with this medicine? -medicines for seizures -medicines to increase blood counts like filgrastim, pegfilgrastim, sargramostim -some antibiotics like amikacin, gentamicin, neomycin, streptomycin, tobramycin -vaccines Talk to your doctor or health care professional before taking any of these medicines: -acetaminophen -aspirin -ibuprofen -ketoprofen -naproxen This list  may not describe all possible interactions. Give your health care provider a list of all the medicines, herbs, non-prescription drugs, or dietary supplements you use. Also tell them if you smoke, drink alcohol, or use illegal drugs. Some items may interact with your medicine. What should I watch for while using this medicine? Your condition will be monitored carefully while you are receiving this medicine. You will need important blood work done while you are taking this medicine. This drug may make you feel generally unwell. This is not uncommon, as chemotherapy can affect healthy cells as well as cancer cells. Report any side effects. Continue your course of treatment even though you feel ill unless your doctor tells you to stop. In some cases, you may be given additional medicines to help with side effects. Follow all directions for their use. Call your doctor or health care professional for advice if you get a fever, chills or sore throat, or other symptoms of a cold or flu. Do not treat yourself. This drug decreases your body's ability to fight infections. Try to avoid being around people who are sick. This medicine may increase your risk to bruise or bleed. Call your doctor or health care professional if you notice any unusual bleeding. Be careful brushing and flossing your teeth or using a toothpick because you may get an infection or bleed more easily. If you have any dental work done, tell your dentist you are receiving this medicine. Avoid taking products that contain aspirin,  acetaminophen, ibuprofen, naproxen, or ketoprofen unless instructed by your doctor. These medicines may hide a fever. Do not become pregnant while taking this medicine. Women should inform their doctor if they wish to become pregnant or think they might be pregnant. There is a potential for serious side effects to an unborn child. Talk to your health care professional or pharmacist for more information. Do not breast-feed an  infant while taking this medicine. What side effects may I notice from receiving this medicine? Side effects that you should report to your doctor or health care professional as soon as possible: -allergic reactions like skin rash, itching or hives, swelling of the face, lips, or tongue -signs of infection - fever or chills, cough, sore throat, pain or difficulty passing urine -signs of decreased platelets or bleeding - bruising, pinpoint red spots on the skin, black, tarry stools, nosebleeds -signs of decreased red blood cells - unusually weak or tired, fainting spells, lightheadedness -breathing problems -changes in hearing -changes in vision -chest pain -high blood pressure -low blood counts - This drug may decrease the number of white blood cells, red blood cells and platelets. You may be at increased risk for infections and bleeding. -nausea and vomiting -pain, swelling, redness or irritation at the injection site -pain, tingling, numbness in the hands or feet -problems with balance, talking, walking -trouble passing urine or change in the amount of urine Side effects that usually do not require medical attention (report to your doctor or health care professional if they continue or are bothersome): -hair loss -loss of appetite -metallic taste in the mouth or changes in taste This list may not describe all possible side effects. Call your doctor for medical advice about side effects. You may report side effects to FDA at 1-800-FDA-1088. Where should I keep my medicine? This drug is given in a hospital or clinic and will not be stored at home. NOTE: This sheet is a summary. It may not cover all possible information. If you have questions about this medicine, talk to your doctor, pharmacist, or health care provider.  2019 Elsevier/Gold Standard (2007-07-27 14:38:05) This information is directly available on the CDC website:  RunningShows.co.za.html    Source:CDC Reference to specific commercial products, manufacturers, companies, or trademarks does not constitute its endorsement or recommendation by the [place], [place], or Centers for [employer] and [employer].

## 2018-08-30 NOTE — Progress Notes (Signed)
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  PRIOR THERAPY: None  CURRENT THERAPY: Palliative radiotherapy to the right eighth rib in addition to concurrent chemoradiation with weekly carboplatin for AUC of 2 and paclitaxel 45 mg/M2 to the lung mass and mediastinal lymphadenopathy for the locally advanced disease.  Status post 1 cycle of chemotherapy.  INTERVAL HISTORY: Zachary Knox 68 y.o. male returns to the clinic today for follow-up visit.  The patient is feeling fine with no concerning complaints except for mild odynophagia.  He also continues to have pain on the right side of the chest.  He denied having any shortness of breath, cough or hemoptysis.  He denied having any fever or chills.  He lost around 3 pounds in the last few weeks.  He has no nausea, vomiting, diarrhea or constipation.  He denied having any headache or visual changes.  The patient is here today for evaluation before starting cycle #2 of his chemotherapy.  MEDICAL HISTORY: Past Medical History:  Diagnosis Date   Cancer ([facility]) 2020   lung   Diabetes mellitus without complication ([facility])     ALLERGIES:  has No Known Allergies.  MEDICATIONS:  Current Outpatient Medications  Medication Sig Dispense Refill   acetaminophen (TYLENOL) 500 MG tablet Take 1,000 mg by mouth every 6 (six) hours as needed for mild pain.      amLODipine (NORVASC) 5 MG tablet Take 5 mg by mouth daily.     aspirin EC 81 MG tablet Take 81 mg by mouth daily.      atorvastatin (LIPITOR) 20 MG tablet Take 20 mg by mouth daily.     carvedilol (COREG) 3.125 MG tablet  Take 3.125 mg by mouth 2 (two) times daily with a meal.     diphenhydrAMINE (BENADRYL) 25 MG tablet Take 1 tablet (25 mg total) by mouth every 6 (six) hours. 20 tablet 0   famotidine (PEPCID) 20 MG tablet Take 1 tablet (20 mg total) by mouth 2 (two) times daily. 10 tablet 0   gabapentin (NEURONTIN) 400 MG capsule Take 1 capsule by mouth every 8 (eight) hours.     ibuprofen (ADVIL,MOTRIN) 200 MG tablet Take 200 mg by mouth every 6 (six) hours as needed for moderate pain.     levothyroxine (SYNTHROID, LEVOTHROID) 100 MCG tablet Take 100 mcg by mouth daily.     metFORMIN (GLUCOPHAGE) 1000 MG tablet Take 500-1,000 mg by mouth See admin instructions. Take 1000mg  in the am and 500mg  in the pm.     metoprolol succinate (TOPROL-XL) 100 MG 24 hr tablet Take 100 mg by mouth daily. Take with or immediately following a meal.     oxyCODONE-acetaminophen (PERCOCET/ROXICET) 5-325 MG tablet Take 1 tablet by mouth every 4 (four) hours as needed for severe pain. 60 tablet 0   pantoprazole (PROTONIX) 40 MG tablet Take 40 mg by mouth 2 (two) times daily.     prochlorperazine (COMPAZINE) 10 MG tablet Take 1 tablet (10 mg total) by mouth every 6 (six) hours as needed for nausea or vomiting. 30 tablet 1   ramipril (ALTACE) 10 MG capsule Take 10 mg by mouth  daily.     TOUJEO SOLOSTAR 300 UNIT/ML SOPN Inject 18 Units into the skin every morning.     traMADol (ULTRAM) 50 MG tablet Take 1 tablet (50 mg total) by mouth every 6 (six) hours as needed. (Patient taking differently: Take 50 mg by mouth every 6 (six) hours as needed for moderate pain. ) 20 tablet 0   No current facility-administered medications for this visit.     SURGICAL HISTORY:  Past Surgical History:  Procedure Laterality Date   CARDIAC SURGERY     Heart attack in 2008 with stent placement   stent in heart      REVIEW OF SYSTEMS:  A comprehensive review of systems was negative except for: Constitutional: positive for  fatigue Respiratory: positive for pleurisy/chest pain Gastrointestinal: positive for odynophagia   PHYSICAL EXAMINATION: General appearance: alert, cooperative, fatigued and no distress Head: Normocephalic, without obvious abnormality, atraumatic Neck: no adenopathy, no JVD, supple, symmetrical, trachea midline and thyroid not enlarged, symmetric, no tenderness/mass/nodules Lymph nodes: Cervical, supraclavicular, and axillary nodes normal. Resp: clear to auscultation bilaterally Back: symmetric, no curvature. ROM normal. No CVA tenderness. Cardio: regular rate and rhythm, S1, S2 normal, no murmur, click, rub or gallop GI: soft, non-tender; bowel sounds normal; no masses,  no organomegaly Extremities: extremities normal, atraumatic, no cyanosis or edema  ECOG PERFORMANCE STATUS: 1 - Symptomatic but completely ambulatory  Blood pressure 131/89, pulse 65, temperature 97.6 F (36.4 C), temperature source Oral, resp. rate 18, height 5\' 9"  (1.753 m), weight 177 lb 11.2 oz (80.6 kg), SpO2 100 %.  LABORATORY DATA: Lab Results  Component Value Date   WBC 4.0 08/30/2018   HGB 13.4 08/30/2018   HCT 40.4 08/30/2018   MCV 95.5 08/30/2018   PLT 166 08/30/2018      Chemistry      Component Value Date/Time   NA 137 08/30/2018 0925   K 4.2 08/30/2018 0925   CL 105 08/30/2018 0925   CO2 23 08/30/2018 0925   BUN 15 08/30/2018 0925   CREATININE 0.85 08/30/2018 0925      Component Value Date/Time   CALCIUM 8.4 (L) 08/30/2018 0925   ALKPHOS 68 08/30/2018 0925   AST 24 08/30/2018 0925   ALT 28 08/30/2018 0925   BILITOT 1.0 08/30/2018 0925       RADIOGRAPHIC STUDIES: Dg Chest 2 View  Result Date: 08/10/2018 CLINICAL DATA:  Right chest wall pain EXAM: CHEST - 2 VIEW COMPARISON:  Chest CT 07/20/2018 FINDINGS: Posterior right lung mass is better characterized on recent chest CT. No focal airspace consolidation or pulmonary edema. No pleural effusion or pneumothorax. IMPRESSION: No active  cardiopulmonary disease. Electronically Signed   By: Ulyses Jarred M.D.   On: 08/10/2018 02:00   Ct Biopsy  Result Date: 08/16/2018 INDICATION: 68 year old with a lung lesion in the right lower lobe superior segment. Suspicious chest lymphadenopathy and suspicious right eighth rib lesion. Patient needs a tissue diagnosis. EXAM: CT-GUIDED CORE BIOPSY OF THE RIGHT EIGHTH RIB LESION MEDICATIONS: None. ANESTHESIA/SEDATION: Moderate (conscious) sedation was employed during this procedure. A total of Versed 2.0 mg and Fentanyl 100 mcg was administered intravenously. Moderate Sedation Time: 25 minutes. The patient's level of consciousness and vital signs were monitored continuously by radiology nursing throughout the procedure under my direct supervision. FLUOROSCOPY TIME:  None COMPLICATIONS: None immediate. PROCEDURE: Informed written consent was obtained from the patient after a thorough discussion of the procedural risks, benefits and alternatives. All questions were addressed. Maximal Sterile Barrier Technique was utilized  including caps, mask, sterile gowns, sterile gloves, sterile drape, hand hygiene and skin antiseptic. A timeout was performed prior to the initiation of the procedure. Patient was placed prone on the CT scanner. Images through the chest were obtained. The lesion involving the posterior right eighth rib was targeted for biopsy. Right side of the back was prepped with chlorhexidine and sterile field was created. Skin and soft tissues were anesthetized with 1% lidocaine. 17 gauge coaxial needle was directed into the soft tissue component of the rib lesion. Needle position confirmed with CT guidance. Core biopsies were obtained with an 18 gauge device and specimens placed in formalin. Needle removed without complication. Bandage placed over the puncture site. FINDINGS: Lesion in superior segment of the right lower lobe. Partially destructive lesion involving the posterior right eighth rib with a  soft tissue component. Core biopsies were obtained from the soft tissue component. IMPRESSION: CT-guided core biopsies of the right eighth rib lesion. Electronically Signed   By: Markus Daft M.D.   On: 08/16/2018 15:50   Dg Chest Port 1 View  Result Date: 08/16/2018 CLINICAL DATA:  68 year old male with a history of eighth rib biopsy EXAM: PORTABLE CHEST 1 VIEW COMPARISON:  08/10/2018, 07/05/2018 FINDINGS: Cardiomediastinal silhouette within normal limits in size and contour. No evidence pleural effusion, pneumothorax, or confluent airspace disease. Destructive lesion at the posterior aspect of the right eighth rib. IMPRESSION: No complicating features of the chest x-ray, status post right eighth rib lesion biopsy. Electronically Signed   By: Corrie Mckusick D.O.   On: 08/16/2018 13:52    ASSESSMENT AND PLAN: This is a very pleasant 68 years old African-American male recently diagnosed with stage IV (T2b, N2, M1c) non-small cell lung cancer, adenocarcinoma diagnosed in April 2020 and presented with right lower lobe lung mass in addition to mediastinal lymphadenopathy and metastatic disease to the right eighth rib. He is currently undergoing a course of concurrent chemoradiation to the locally advanced disease in addition to palliative radiotherapy to the solitary right rib bone lesion. The patient is tolerating this treatment well except for mild odynophagia. I recommended for him to proceed with cycle #2 of his chemotherapy today as scheduled. I will see him back for follow-up visit in 2 weeks for evaluation before starting cycle #4. He was advised to call immediately if he has any concerning symptoms in the interval. The patient voices understanding of current disease status and treatment options and is in agreement with the current care plan. All questions were answered. The patient knows to call the clinic with any problems, questions or concerns. We can certainly see the patient much sooner if  necessary.  I spent 10 minutes counseling the patient face to face. The total time spent in the appointment was 15 minutes.  Disclaimer: This note was dictated with voice recognition software. Similar sounding words can inadvertently be transcribed and may not be corrected upon review.

## 2018-08-31 ENCOUNTER — Ambulatory Visit
Admission: RE | Admit: 2018-08-31 | Discharge: 2018-08-31 | Disposition: A | Discharge status: Home / Self Care | Payer: Medicare Other | Source: Ambulatory Visit | Attending: Radiation Oncology | Admitting: Radiation Oncology

## 2018-08-31 ENCOUNTER — Encounter (HOSPITAL_COMMUNITY): Payer: Self-pay | Admitting: Internal Medicine

## 2018-08-31 ENCOUNTER — Other Ambulatory Visit: Payer: Self-pay

## 2018-09-01 ENCOUNTER — Ambulatory Visit
Admission: RE | Admit: 2018-09-01 | Discharge: 2018-09-01 | Disposition: A | Discharge status: Home / Self Care | Payer: Medicare Other | Source: Ambulatory Visit | Attending: Radiation Oncology | Admitting: Radiation Oncology

## 2018-09-01 ENCOUNTER — Other Ambulatory Visit: Payer: Self-pay

## 2018-09-02 ENCOUNTER — Other Ambulatory Visit: Payer: Self-pay | Admitting: Radiation Oncology

## 2018-09-02 ENCOUNTER — Other Ambulatory Visit: Payer: Self-pay

## 2018-09-02 ENCOUNTER — Ambulatory Visit
Admission: RE | Admit: 2018-09-02 | Discharge: 2018-09-02 | Disposition: A | Discharge status: Home / Self Care | Payer: Medicare Other | Source: Ambulatory Visit | Attending: Radiation Oncology | Admitting: Radiation Oncology

## 2018-09-02 MED ORDER — OXYCODONE-ACETAMINOPHEN 5-325 MG PO TABS: 1.0000 | ORAL_TABLET | ORAL | 0 refills | Status: DC | PRN

## 2018-09-03 ENCOUNTER — Ambulatory Visit
Admission: RE | Admit: 2018-09-03 | Discharge: 2018-09-03 | Disposition: A | Discharge status: Home / Self Care | Payer: Medicare Other | Source: Ambulatory Visit | Attending: Radiation Oncology | Admitting: Radiation Oncology

## 2018-09-03 ENCOUNTER — Other Ambulatory Visit: Payer: Self-pay

## 2018-09-03 DIAGNOSIS — R918 Other nonspecific abnormal finding of lung field: Secondary | ICD-10-CM | POA: Insufficient documentation

## 2018-09-03 DIAGNOSIS — C801 Malignant (primary) neoplasm, unspecified: Secondary | ICD-10-CM | POA: Insufficient documentation

## 2018-09-03 DIAGNOSIS — R590 Localized enlarged lymph nodes: Secondary | ICD-10-CM | POA: Diagnosis not present

## 2018-09-03 DIAGNOSIS — Z510 Encounter for antineoplastic radiation therapy: Secondary | ICD-10-CM | POA: Diagnosis not present

## 2018-09-03 DIAGNOSIS — C7951 Secondary malignant neoplasm of bone: Secondary | ICD-10-CM | POA: Diagnosis not present

## 2018-09-06 ENCOUNTER — Inpatient Hospital Stay: Payer: Medicare Other

## 2018-09-06 ENCOUNTER — Ambulatory Visit
Admission: RE | Admit: 2018-09-06 | Discharge: 2018-09-06 | Disposition: A | Discharge status: Home / Self Care | Payer: Medicare Other | Source: Ambulatory Visit | Attending: Radiation Oncology | Admitting: Radiation Oncology

## 2018-09-06 ENCOUNTER — Other Ambulatory Visit: Payer: Self-pay

## 2018-09-06 ENCOUNTER — Inpatient Hospital Stay: Payer: Medicare Other | Attending: Internal Medicine

## 2018-09-06 VITALS — BP 117/82 | HR 68 | Temp 98.0°F | Resp 18 | Ht 69.0 in | Wt 175.0 lb

## 2018-09-06 DIAGNOSIS — C3431 Malignant neoplasm of lower lobe, right bronchus or lung: Secondary | ICD-10-CM | POA: Insufficient documentation

## 2018-09-06 DIAGNOSIS — Z5111 Encounter for antineoplastic chemotherapy: Secondary | ICD-10-CM | POA: Diagnosis not present

## 2018-09-06 DIAGNOSIS — C3491 Malignant neoplasm of unspecified part of right bronchus or lung: Secondary | ICD-10-CM

## 2018-09-06 DIAGNOSIS — C7951 Secondary malignant neoplasm of bone: Secondary | ICD-10-CM | POA: Diagnosis present

## 2018-09-06 DIAGNOSIS — Z510 Encounter for antineoplastic radiation therapy: Secondary | ICD-10-CM | POA: Diagnosis not present

## 2018-09-06 LAB — CBC WITH DIFFERENTIAL (CANCER CENTER ONLY)
Abs Immature Granulocytes: 0.02 10*3/uL (ref 0.00–0.07)
Basophils Absolute: 0.1 10*3/uL (ref 0.0–0.1)
Basophils Relative: 1 %
Eosinophils Absolute: 0.1 10*3/uL (ref 0.0–0.5)
Eosinophils Relative: 2 %
HCT: 37.8 % — ABNORMAL LOW (ref 39.0–52.0)
Hemoglobin: 12.7 g/dL — ABNORMAL LOW (ref 13.0–17.0)
Immature Granulocytes: 1 %
Lymphocytes Relative: 14 %
Lymphs Abs: 0.6 10*3/uL — ABNORMAL LOW (ref 0.7–4.0)
MCH: 31.7 pg (ref 26.0–34.0)
MCHC: 33.6 g/dL (ref 30.0–36.0)
MCV: 94.3 fL (ref 80.0–100.0)
Monocytes Absolute: 0.4 10*3/uL (ref 0.1–1.0)
Monocytes Relative: 10 %
Neutro Abs: 2.8 10*3/uL (ref 1.7–7.7)
Neutrophils Relative %: 72 %
Platelet Count: 221 10*3/uL (ref 150–400)
RBC: 4.01 MIL/uL — ABNORMAL LOW (ref 4.22–5.81)
RDW: 13.5 % (ref 11.5–15.5)
WBC Count: 3.9 10*3/uL — ABNORMAL LOW (ref 4.0–10.5)
nRBC: 0 % (ref 0.0–0.2)

## 2018-09-06 LAB — CMP (CANCER CENTER ONLY)
ALT: 21 U/L (ref 0–44)
AST: 19 U/L (ref 15–41)
Albumin: 3.1 g/dL — ABNORMAL LOW (ref 3.5–5.0)
Alkaline Phosphatase: 68 U/L (ref 38–126)
Anion gap: 11 (ref 5–15)
BUN: 16 mg/dL (ref 8–23)
CO2: 21 mmol/L — ABNORMAL LOW (ref 22–32)
Calcium: 8.7 mg/dL — ABNORMAL LOW (ref 8.9–10.3)
Chloride: 105 mmol/L (ref 98–111)
Creatinine: 0.88 mg/dL (ref 0.61–1.24)
GFR, Est AFR Am: >60 mL/min (ref >60)
GFR, Estimated: >60 mL/min (ref >60)
Glucose, Bld: 168 mg/dL — ABNORMAL HIGH (ref 70–99)
Potassium: 4.1 mmol/L (ref 3.5–5.1)
Sodium: 137 mmol/L (ref 135–145)
Total Bilirubin: 1.2 mg/dL (ref 0.3–1.2)
Total Protein: 6.5 g/dL (ref 6.5–8.1)

## 2018-09-06 MED ORDER — SODIUM CHLORIDE 0.9 % IV SOLN
Freq: Once | INTRAVENOUS | Status: AC
  Administered 2018-09-06: 11:00:00 via INTRAVENOUS
  Filled 2018-09-06: qty 250

## 2018-09-06 MED ORDER — PALONOSETRON HCL INJECTION 0.25 MG/5ML
0.2500 mg | Freq: Once | INTRAVENOUS | Status: AC
  Administered 2018-09-06: 0.25 mg via INTRAVENOUS

## 2018-09-06 MED ORDER — SODIUM CHLORIDE 0.9 % IV SOLN
20.0000 mg | Freq: Once | INTRAVENOUS | Status: AC
  Administered 2018-09-06: 11:00:00 20 mg via INTRAVENOUS
  Filled 2018-09-06: qty 20

## 2018-09-06 MED ORDER — DIPHENHYDRAMINE HCL 50 MG/ML IJ SOLN
INTRAMUSCULAR | Status: AC
  Filled 2018-09-06: qty 1

## 2018-09-06 MED ORDER — PALONOSETRON HCL INJECTION 0.25 MG/5ML
INTRAVENOUS | Status: AC
  Filled 2018-09-06: qty 5

## 2018-09-06 MED ORDER — FAMOTIDINE IN NACL 20-0.9 MG/50ML-% IV SOLN
INTRAVENOUS | Status: AC
  Filled 2018-09-06: qty 50

## 2018-09-06 MED ORDER — SODIUM CHLORIDE 0.9 % IV SOLN
45.0000 mg/m2 | Freq: Once | INTRAVENOUS | Status: AC
  Administered 2018-09-06: 90 mg via INTRAVENOUS
  Filled 2018-09-06: qty 15

## 2018-09-06 MED ORDER — SODIUM CHLORIDE 0.9 % IV SOLN
205.0000 mg | Freq: Once | INTRAVENOUS | Status: AC
  Administered 2018-09-06: 13:00:00 210 mg via INTRAVENOUS
  Filled 2018-09-06: qty 21

## 2018-09-06 MED ORDER — FAMOTIDINE IN NACL 20-0.9 MG/50ML-% IV SOLN
20.0000 mg | Freq: Once | INTRAVENOUS | Status: AC
  Administered 2018-09-06: 11:00:00 20 mg via INTRAVENOUS

## 2018-09-06 MED ORDER — DIPHENHYDRAMINE HCL 50 MG/ML IJ SOLN
50.0000 mg | Freq: Once | INTRAMUSCULAR | Status: AC
  Administered 2018-09-06: 11:00:00 50 mg via INTRAVENOUS

## 2018-09-06 NOTE — Patient Instructions (Signed)
[facility] Discharge Instructions for Patients Receiving Chemotherapy  Today you received the following chemotherapy agents Taxol and Carboplatin.  To help prevent nausea and vomiting after your treatment, we encourage you to take your nausea medication as directed BUT NO ZOFRAN FOR 3 DAYS AFTER CHEMO FOR NAUSEA.   If you develop nausea and vomiting that is not controlled by your nausea medication, call the clinic.   BELOW ARE SYMPTOMS THAT SHOULD BE REPORTED IMMEDIATELY:  *FEVER GREATER THAN 100.5 F  *CHILLS WITH OR WITHOUT FEVER  NAUSEA AND VOMITING THAT IS NOT CONTROLLED WITH YOUR NAUSEA MEDICATION  *UNUSUAL SHORTNESS OF BREATH  *UNUSUAL BRUISING OR BLEEDING  TENDERNESS IN MOUTH AND THROAT WITH OR WITHOUT PRESENCE OF ULCERS  *URINARY PROBLEMS  *BOWEL PROBLEMS  UNUSUAL RASH Items with * indicate a potential emergency and should be followed up as soon as possible.  Feel free to call the clinic you have any questions or concerns. The clinic phone number is [phone].  Please show the [facility] at check-in to the Emergency Department and triage nurse.

## 2018-09-07 ENCOUNTER — Ambulatory Visit
Admission: RE | Admit: 2018-09-07 | Discharge: 2018-09-07 | Disposition: A | Discharge status: Home / Self Care | Payer: Medicare Other | Source: Ambulatory Visit | Attending: Radiation Oncology | Admitting: Radiation Oncology

## 2018-09-07 ENCOUNTER — Other Ambulatory Visit: Payer: Self-pay

## 2018-09-07 DIAGNOSIS — Z510 Encounter for antineoplastic radiation therapy: Secondary | ICD-10-CM | POA: Diagnosis not present

## 2018-09-08 ENCOUNTER — Ambulatory Visit
Admission: RE | Admit: 2018-09-08 | Discharge: 2018-09-08 | Disposition: A | Discharge status: Home / Self Care | Payer: Medicare Other | Source: Ambulatory Visit | Attending: Radiation Oncology | Admitting: Radiation Oncology

## 2018-09-08 ENCOUNTER — Other Ambulatory Visit: Payer: Self-pay

## 2018-09-08 DIAGNOSIS — Z510 Encounter for antineoplastic radiation therapy: Secondary | ICD-10-CM | POA: Diagnosis not present

## 2018-09-09 ENCOUNTER — Ambulatory Visit
Admission: RE | Admit: 2018-09-09 | Discharge: 2018-09-09 | Disposition: A | Discharge status: Home / Self Care | Payer: Medicare Other | Source: Ambulatory Visit | Attending: Radiation Oncology | Admitting: Radiation Oncology

## 2018-09-09 ENCOUNTER — Other Ambulatory Visit: Payer: Self-pay

## 2018-09-09 DIAGNOSIS — Z510 Encounter for antineoplastic radiation therapy: Secondary | ICD-10-CM | POA: Diagnosis not present

## 2018-09-10 ENCOUNTER — Other Ambulatory Visit: Payer: Self-pay

## 2018-09-10 ENCOUNTER — Encounter (HOSPITAL_COMMUNITY): Payer: Self-pay | Admitting: Internal Medicine

## 2018-09-10 ENCOUNTER — Ambulatory Visit
Admission: RE | Admit: 2018-09-10 | Discharge: 2018-09-10 | Disposition: A | Discharge status: Home / Self Care | Payer: Medicare Other | Source: Ambulatory Visit | Attending: Radiation Oncology | Admitting: Radiation Oncology

## 2018-09-10 DIAGNOSIS — Z510 Encounter for antineoplastic radiation therapy: Secondary | ICD-10-CM | POA: Diagnosis not present

## 2018-09-13 ENCOUNTER — Other Ambulatory Visit: Payer: Self-pay

## 2018-09-13 ENCOUNTER — Inpatient Hospital Stay: Payer: Medicare Other

## 2018-09-13 ENCOUNTER — Ambulatory Visit
Admission: RE | Admit: 2018-09-13 | Discharge: 2018-09-13 | Disposition: A | Discharge status: Home / Self Care | Payer: Medicare Other | Source: Ambulatory Visit | Attending: Radiation Oncology | Admitting: Radiation Oncology

## 2018-09-13 ENCOUNTER — Encounter: Payer: Self-pay | Admitting: Internal Medicine

## 2018-09-13 ENCOUNTER — Inpatient Hospital Stay (HOSPITAL_BASED_OUTPATIENT_CLINIC_OR_DEPARTMENT_OTHER): Payer: Medicare Other | Admitting: Physician Assistant

## 2018-09-13 ENCOUNTER — Encounter: Payer: Self-pay | Admitting: Physician Assistant

## 2018-09-13 ENCOUNTER — Other Ambulatory Visit: Payer: Medicare Other

## 2018-09-13 ENCOUNTER — Other Ambulatory Visit: Payer: Self-pay | Admitting: Physician Assistant

## 2018-09-13 VITALS — BP 111/82 | HR 70 | Temp 98.2°F | Resp 18 | Ht 69.0 in | Wt 178.3 lb

## 2018-09-13 DIAGNOSIS — G893 Neoplasm related pain (acute) (chronic): Secondary | ICD-10-CM

## 2018-09-13 DIAGNOSIS — Z5111 Encounter for antineoplastic chemotherapy: Secondary | ICD-10-CM

## 2018-09-13 DIAGNOSIS — C3491 Malignant neoplasm of unspecified part of right bronchus or lung: Secondary | ICD-10-CM

## 2018-09-13 DIAGNOSIS — K208 Other esophagitis: Secondary | ICD-10-CM

## 2018-09-13 DIAGNOSIS — C7951 Secondary malignant neoplasm of bone: Secondary | ICD-10-CM | POA: Diagnosis not present

## 2018-09-13 DIAGNOSIS — C3431 Malignant neoplasm of lower lobe, right bronchus or lung: Secondary | ICD-10-CM | POA: Diagnosis not present

## 2018-09-13 DIAGNOSIS — Z510 Encounter for antineoplastic radiation therapy: Secondary | ICD-10-CM | POA: Diagnosis not present

## 2018-09-13 LAB — CMP (CANCER CENTER ONLY)
ALT: 35 U/L (ref 0–44)
AST: 22 U/L (ref 15–41)
Albumin: 3.2 g/dL — ABNORMAL LOW (ref 3.5–5.0)
Alkaline Phosphatase: 69 U/L (ref 38–126)
Anion gap: 10 (ref 5–15)
BUN: 11 mg/dL (ref 8–23)
CO2: 20 mmol/L — ABNORMAL LOW (ref 22–32)
Calcium: 8.6 mg/dL — ABNORMAL LOW (ref 8.9–10.3)
Chloride: 108 mmol/L (ref 98–111)
Creatinine: 0.85 mg/dL (ref 0.61–1.24)
GFR, Est AFR Am: >60 mL/min (ref >60)
GFR, Estimated: >60 mL/min (ref >60)
Glucose, Bld: 144 mg/dL — ABNORMAL HIGH (ref 70–99)
Potassium: 3.9 mmol/L (ref 3.5–5.1)
Sodium: 138 mmol/L (ref 135–145)
Total Bilirubin: 1 mg/dL (ref 0.3–1.2)
Total Protein: 6.3 g/dL — ABNORMAL LOW (ref 6.5–8.1)

## 2018-09-13 LAB — CBC WITH DIFFERENTIAL (CANCER CENTER ONLY)
Abs Immature Granulocytes: 0.02 10*3/uL (ref 0.00–0.07)
Basophils Absolute: 0 10*3/uL (ref 0.0–0.1)
Basophils Relative: 1 %
Eosinophils Absolute: 0.1 10*3/uL (ref 0.0–0.5)
Eosinophils Relative: 2 %
HCT: 37.7 % — ABNORMAL LOW (ref 39.0–52.0)
Hemoglobin: 12.7 g/dL — ABNORMAL LOW (ref 13.0–17.0)
Immature Granulocytes: 1 %
Lymphocytes Relative: 15 %
Lymphs Abs: 0.5 10*3/uL — ABNORMAL LOW (ref 0.7–4.0)
MCH: 31.4 pg (ref 26.0–34.0)
MCHC: 33.7 g/dL (ref 30.0–36.0)
MCV: 93.3 fL (ref 80.0–100.0)
Monocytes Absolute: 0.4 10*3/uL (ref 0.1–1.0)
Monocytes Relative: 11 %
Neutro Abs: 2.3 10*3/uL (ref 1.7–7.7)
Neutrophils Relative %: 70 %
Platelet Count: 201 10*3/uL (ref 150–400)
RBC: 4.04 MIL/uL — ABNORMAL LOW (ref 4.22–5.81)
RDW: 13.4 % (ref 11.5–15.5)
WBC Count: 3.2 10*3/uL — ABNORMAL LOW (ref 4.0–10.5)
nRBC: 0 % (ref 0.0–0.2)

## 2018-09-13 MED ORDER — FAMOTIDINE IN NACL 20-0.9 MG/50ML-% IV SOLN
20.0000 mg | Freq: Once | INTRAVENOUS | Status: AC
  Administered 2018-09-13: 20 mg via INTRAVENOUS

## 2018-09-13 MED ORDER — SODIUM CHLORIDE 0.9 % IV SOLN
Freq: Once | INTRAVENOUS | Status: AC
  Administered 2018-09-13: 10:00:00 via INTRAVENOUS
  Filled 2018-09-13: qty 250

## 2018-09-13 MED ORDER — FAMOTIDINE IN NACL 20-0.9 MG/50ML-% IV SOLN
INTRAVENOUS | Status: AC
  Filled 2018-09-13: qty 50

## 2018-09-13 MED ORDER — SODIUM CHLORIDE 0.9 % IV SOLN
45.0000 mg/m2 | Freq: Once | INTRAVENOUS | Status: AC
  Administered 2018-09-13: 90 mg via INTRAVENOUS
  Filled 2018-09-13: qty 15

## 2018-09-13 MED ORDER — OXYCODONE-ACETAMINOPHEN 5-325 MG PO TABS
1.0000 | ORAL_TABLET | Freq: Once | ORAL | Status: AC
  Administered 2018-09-13: 11:00:00 1 via ORAL

## 2018-09-13 MED ORDER — DIPHENHYDRAMINE HCL 50 MG/ML IJ SOLN
INTRAMUSCULAR | Status: AC
  Filled 2018-09-13: qty 1

## 2018-09-13 MED ORDER — PALONOSETRON HCL INJECTION 0.25 MG/5ML
INTRAVENOUS | Status: AC
  Filled 2018-09-13: qty 5

## 2018-09-13 MED ORDER — SODIUM CHLORIDE 0.9 % IV SOLN
205.0000 mg | Freq: Once | INTRAVENOUS | Status: AC
  Administered 2018-09-13: 13:00:00 210 mg via INTRAVENOUS
  Filled 2018-09-13: qty 21

## 2018-09-13 MED ORDER — DIPHENHYDRAMINE HCL 50 MG/ML IJ SOLN
50.0000 mg | Freq: Once | INTRAMUSCULAR | Status: AC
  Administered 2018-09-13: 10:00:00 50 mg via INTRAVENOUS

## 2018-09-13 MED ORDER — SODIUM CHLORIDE 0.9 % IV SOLN
20.0000 mg | Freq: Once | INTRAVENOUS | Status: AC
  Administered 2018-09-13: 10:00:00 20 mg via INTRAVENOUS
  Filled 2018-09-13: qty 20

## 2018-09-13 MED ORDER — PALONOSETRON HCL INJECTION 0.25 MG/5ML
0.2500 mg | Freq: Once | INTRAVENOUS | Status: AC
  Administered 2018-09-13: 0.25 mg via INTRAVENOUS

## 2018-09-13 MED ORDER — OXYCODONE-ACETAMINOPHEN 5-325 MG PO TABS
ORAL_TABLET | ORAL | Status: AC
  Filled 2018-09-13: qty 1

## 2018-09-13 NOTE — Patient Instructions (Signed)
[facility] Discharge Instructions for Patients Receiving Chemotherapy  Today you received the following chemotherapy agents:  Taxol, Carboplatin  To help prevent nausea and vomiting after your treatment, we encourage you to take your nausea medication as prescribed.   If you develop nausea and vomiting that is not controlled by your nausea medication, call the clinic.   BELOW ARE SYMPTOMS THAT SHOULD BE REPORTED IMMEDIATELY:  *FEVER GREATER THAN 100.5 F  *CHILLS WITH OR WITHOUT FEVER  NAUSEA AND VOMITING THAT IS NOT CONTROLLED WITH YOUR NAUSEA MEDICATION  *UNUSUAL SHORTNESS OF BREATH  *UNUSUAL BRUISING OR BLEEDING  TENDERNESS IN MOUTH AND THROAT WITH OR WITHOUT PRESENCE OF ULCERS  *URINARY PROBLEMS  *BOWEL PROBLEMS  UNUSUAL RASH Items with * indicate a potential emergency and should be followed up as soon as possible.  Feel free to call the clinic should you have any questions or concerns. The clinic phone number is [phone].  Please show the CHEMO ALERT CARD at check-in to the Emergency Department and triage nurse.   

## 2018-09-13 NOTE — Progress Notes (Signed)
1140  -   Pt stated complete pain relief.

## 2018-09-13 NOTE — Progress Notes (Signed)
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  PRIOR THERAPY: None  CURRENT THERAPY: Palliative radiotherapy to the right eighth rib in addition to concurrent chemoradiation with weekly carboplatin for AUC of 2 and paclitaxel 45 mg/M2 to the lung mass and mediastinal lymphadenopathy for the locally advanced disease.  Status post 3 cycles of chemotherapy.  INTERVAL HISTORY: Zachary Knox 68 y.o. male returns to the clinic for a follow-up visit.  The patient is feeling well today without any concerning complaints except for mild odynophagia secondary to radiation-induced esophagitis without any weight loss.  His last radiation treatment is scheduled for Wednesday, Sep 15, 2018. He also had palliative radiation to the right 8th rib which has resolved his chest wall pain.   Otherwise, the patient continues to tolerate his chemotherapy well without any adverse effects.  He denies any fever, chills, or night sweats.  He denies any chest pain, cough, or hemoptysis. He reports baseline dyspnea on exertion.  Denies any nausea, vomiting, diarrhea, or any constipation.  He denies any headaches or visual changes.  He is here today for evaluation prior to starting cycle #4.  MEDICAL HISTORY: Past Medical History:  Diagnosis Date  . Cancer ([place]) 2020   lung  . Diabetes mellitus without complication (HCC)     ALLERGIES:  has No Known Allergies.  MEDICATIONS:  Current Outpatient Medications  Medication Sig Dispense Refill  . acetaminophen (TYLENOL) 500 MG tablet Take 1,000 mg by mouth every 6 (six) hours as needed for mild pain.     Marland Kitchen amLODipine (NORVASC) 5 MG tablet Take 5 mg by  mouth daily.    Marland Kitchen aspirin EC 81 MG tablet Take 81 mg by mouth daily.     Marland Kitchen atorvastatin (LIPITOR) 20 MG tablet Take 20 mg by mouth daily.    . carvedilol (COREG) 3.125 MG tablet Take 3.125 mg by mouth 2 (two) times daily with a meal.    . diphenhydrAMINE (BENADRYL) 25 MG tablet Take 1 tablet (25 mg total) by mouth every 6 (six) hours. 20 tablet 0  . famotidine (PEPCID) 20 MG tablet Take 1 tablet (20 mg total) by mouth 2 (two) times daily. 10 tablet 0  . gabapentin (NEURONTIN) 400 MG capsule Take 1 capsule by mouth every 8 (eight) hours.    Marland Kitchen ibuprofen (ADVIL,MOTRIN) 200 MG tablet Take 200 mg by mouth every 6 (six) hours as needed for moderate pain.    Marland Kitchen levothyroxine (SYNTHROID, LEVOTHROID) 100 MCG tablet Take 100 mcg by mouth daily.    . metFORMIN (GLUCOPHAGE) 1000 MG tablet Take 500-1,000 mg by mouth See admin instructions. Take 1000mg  in the am and 500mg  in the pm.    . metoprolol succinate (TOPROL-XL) 100 MG 24 hr tablet Take 100 mg by mouth daily. Take with or immediately following a meal.    . oxyCODONE-acetaminophen (PERCOCET/ROXICET) 5-325 MG tablet Take 1 tablet by mouth every 4 (four) hours as needed for severe pain. 60 tablet 0  . pantoprazole (PROTONIX) 40 MG tablet Take 40 mg by mouth 2 (two) times daily.    . prochlorperazine (COMPAZINE) 10 MG tablet Take 1 tablet (10 mg total) by mouth every 6 (six) hours as needed for  nausea or vomiting. (Patient not taking: Reported on 08/30/2018) 30 tablet 1  . ramipril (ALTACE) 10 MG capsule Take 10 mg by mouth daily.    . sucralfate (CARAFATE) 1 g tablet Take 1 tablet (1 g total) by mouth 4 (four) times daily -  with meals and at bedtime. Crush and dissolve in 10 mL of water prior to swallowing 60 tablet 1  . TOUJEO SOLOSTAR 300 UNIT/ML SOPN Inject 18 Units into the skin every morning.    . traMADol (ULTRAM) 50 MG tablet Take 1 tablet (50 mg total) by mouth every 6 (six) hours as needed. (Patient taking differently: Take 50 mg by mouth every 6  (six) hours as needed for moderate pain. ) 20 tablet 0   No current facility-administered medications for this visit.    Facility-Administered Medications Ordered in Other Visits  Medication Dose Route Frequency Provider Last Rate Last Dose  . 0.9 %  sodium chloride infusion   Intravenous Once Curt Bears, MD      . dexamethasone (DECADRON) 20 mg in sodium chloride 0.9 % 50 mL IVPB  20 mg Intravenous Once Curt Bears, MD      . diphenhydrAMINE (BENADRYL) injection 50 mg  50 mg Intravenous Once Curt Bears, MD      . famotidine (PEPCID) IVPB 20 mg premix  20 mg Intravenous Once Curt Bears, MD      . palonosetron (ALOXI) injection 0.25 mg  0.25 mg Intravenous Once Curt Bears, MD        SURGICAL HISTORY:  Past Surgical History:  Procedure Laterality Date  . CARDIAC SURGERY     Heart attack in 2008 with stent placement  . stent in heart      REVIEW OF SYSTEMS:   Review of Systems  Constitutional: Negative for appetite change, chills, fatigue, fever and unexpected weight change.  HENT:  Positive for odynophagia. Negative for mouth sores, nosebleeds, and trouble swallowing.   Eyes: Negative for eye problems and icterus.  Respiratory: Positive dyspnea on exertion. Negative for cough, hemoptysis, and wheezing.   Cardiovascular: Negative for chest pain and leg swelling.  Gastrointestinal: Negative for abdominal pain, constipation, diarrhea, nausea and vomiting.  Genitourinary: Negative for bladder incontinence, difficulty urinating, dysuria, frequency and hematuria.   Musculoskeletal: Negative for back pain, gait problem, neck pain and neck stiffness.  Skin: Negative for itching and rash.  Neurological: Negative for dizziness, extremity weakness, gait problem, headaches, light-headedness and seizures.  Hematological: Negative for adenopathy. Does not bruise/bleed easily.  Psychiatric/Behavioral: Negative for confusion, depression and sleep disturbance. The patient  is not nervous/anxious.     PHYSICAL EXAMINATION:  Blood pressure 111/82, pulse 70, temperature 98.2 F (36.8 C), temperature source Oral, resp. rate 18, height 5\' 9"  (1.753 m), weight 178 lb 4.8 oz (80.9 kg), SpO2 100 %.  ECOG PERFORMANCE STATUS: 1 - Symptomatic but completely ambulatory  Physical Exam  Constitutional: Oriented to person, place, and time and well-developed, well-nourished, and in no distress.  HENT:  Head: Normocephalic and atraumatic.  Mouth/Throat: Oropharynx is clear and moist. No oropharyngeal exudate.  Eyes: Conjunctivae are normal. Right eye exhibits no discharge. Left eye exhibits no discharge. No scleral icterus.  Neck: Normal range of motion. Neck supple.  Cardiovascular: Normal rate, regular rhythm, normal heart sounds and intact distal pulses.   Pulmonary/Chest: Effort normal and breath sounds normal. No respiratory distress. No wheezes. No rales.  Abdominal: Soft. Bowel sounds are normal. Exhibits no distension and no mass. There is no tenderness.  Musculoskeletal:  Normal range of motion. Exhibits no edema.  Lymphadenopathy:    No cervical adenopathy.  Neurological: Alert and oriented to person, place, and time. Exhibits normal muscle tone. Gait normal. Coordination normal.  Skin: Skin is warm and dry. No rash noted. Not diaphoretic. No erythema. No pallor.  Psychiatric: Mood, memory and judgment normal.  Vitals reviewed.  LABORATORY DATA: Lab Results  Component Value Date   WBC 3.2 (L) 09/13/2018   HGB 12.7 (L) 09/13/2018   HCT 37.7 (L) 09/13/2018   MCV 93.3 09/13/2018   PLT 201 09/13/2018      Chemistry      Component Value Date/Time   NA 138 09/13/2018 0732   K 3.9 09/13/2018 0732   CL 108 09/13/2018 0732   CO2 20 (L) 09/13/2018 0732   BUN 11 09/13/2018 0732   CREATININE 0.85 09/13/2018 0732      Component Value Date/Time   CALCIUM 8.6 (L) 09/13/2018 0732   ALKPHOS 69 09/13/2018 0732   AST 22 09/13/2018 0732   ALT 35 09/13/2018 0732    BILITOT 1.0 09/13/2018 0732       RADIOGRAPHIC STUDIES:  Ct Biopsy  Result Date: 08/16/2018 INDICATION: 68 year old with a lung lesion in the right lower lobe superior segment. Suspicious chest lymphadenopathy and suspicious right eighth rib lesion. Patient needs a tissue diagnosis. EXAM: CT-GUIDED CORE BIOPSY OF THE RIGHT EIGHTH RIB LESION MEDICATIONS: None. ANESTHESIA/SEDATION: Moderate (conscious) sedation was employed during this procedure. A total of Versed 2.0 mg and Fentanyl 100 mcg was administered intravenously. Moderate Sedation Time: 25 minutes. The patient's level of consciousness and vital signs were monitored continuously by radiology nursing throughout the procedure under my direct supervision. FLUOROSCOPY TIME:  None COMPLICATIONS: None immediate. PROCEDURE: Informed written consent was obtained from the patient after a thorough discussion of the procedural risks, benefits and alternatives. All questions were addressed. Maximal Sterile Barrier Technique was utilized including caps, mask, sterile gowns, sterile gloves, sterile drape, hand hygiene and skin antiseptic. A timeout was performed prior to the initiation of the procedure. Patient was placed prone on the CT scanner. Images through the chest were obtained. The lesion involving the posterior right eighth rib was targeted for biopsy. Right side of the back was prepped with chlorhexidine and sterile field was created. Skin and soft tissues were anesthetized with 1% lidocaine. 17 gauge coaxial needle was directed into the soft tissue component of the rib lesion. Needle position confirmed with CT guidance. Core biopsies were obtained with an 18 gauge device and specimens placed in formalin. Needle removed without complication. Bandage placed over the puncture site. FINDINGS: Lesion in superior segment of the right lower lobe. Partially destructive lesion involving the posterior right eighth rib with a soft tissue component. Core  biopsies were obtained from the soft tissue component. IMPRESSION: CT-guided core biopsies of the right eighth rib lesion. Electronically Signed   By: Markus Daft M.D.   On: 08/16/2018 15:50   Dg Chest Port 1 View  Result Date: 08/16/2018 CLINICAL DATA:  68 year old male with a history of eighth rib biopsy EXAM: PORTABLE CHEST 1 VIEW COMPARISON:  08/10/2018, 07/05/2018 FINDINGS: Cardiomediastinal silhouette within normal limits in size and contour. No evidence pleural effusion, pneumothorax, or confluent airspace disease. Destructive lesion at the posterior aspect of the right eighth rib. IMPRESSION: No complicating features of the chest x-ray, status post right eighth rib lesion biopsy. Electronically Signed   By: Corrie Mckusick D.O.   On: 08/16/2018 13:52     ASSESSMENT/PLAN:  This is a very pleasant 68 year old African-American male diagnosed with stage IV (T2b, N2, M1C) non-small cell lung cancer, adenocarcinoma.  He presented with a right lower lobe lung mass in addition to mediastinal lymphadenopathy and metastatic disease to the right eighth rib.  He was diagnosed in April 2020.   He is currently undergoing a course of concurrent chemoradiation with carboplatin for an AUC of 2 and paclitaxel 45 mg/m in addition to palliative radiotherapy to the solitary right 8th rib lesion.  The patient is status post 3 cycles.  He has been tolerating treatment well except for some mild odynophagia.  His last radiation treatment is scheduled for this week on Sep 15, 2018. The patient was seen with Dr. Julien Nordmann today.  Labs were reviewed with the patient.  We recommend that he proceed with his last cycle of treatment today, cycle #4.    I will arrange for restaging CT scan of the chest to be performed prior to his next visit.  We will see the patient back for follow-up visit in 1 month for evaluation and to review his scan results.   The patient's biopsy was sent for molecular studies by [facility].  Unfortunately, they were unable to obtain results from the sample. We will arrange for the patient to get molecular studies performed by [facility] today.   The patient was advised to call immediately if he has any concerning symptoms in the interval. The patient voices understanding of current disease status and treatment options and is in agreement with the current care plan. All questions were answered. The patient knows to call the clinic with any problems, questions or concerns. We can certainly see the patient much sooner if necessary  Orders Placed This Encounter  Procedures  . CT Chest W Contrast    Standing Status:   Future    Standing Expiration Date:   09/13/2019    Order Specific Question:   ** REASON FOR EXAM (FREE TEXT)    Answer:   restaging Lung cancer    Order Specific Question:   If indicated for the ordered procedure, I authorize the administration of contrast media per Radiology protocol    Answer:   Yes    Order Specific Question:   Preferred imaging location?    Answer:   [facility]    Order Specific Question:   Radiology Contrast Protocol - do NOT remove file path    Answer:   [email or web address]     Lum Stillinger L Desirai Traxler, PA-C 09/13/18  ADDENDUM: Hematology/Oncology Attending: I had a face-to-face encounter with the patient today.  I recommended his care plan.  The patient is a very pleasant 68 years old African-American male with stage IV non-small cell lung cancer presented with locally advanced disease in the chest in addition to solitary brain metastasis.  He underwent palliative radiotherapy to the ribs and currently undergoing a course of concurrent chemoradiation with weekly carboplatin and paclitaxel to the locally advanced disease in the chest.  The patient has been tolerating this treatment well with no concerning adverse effects. I recommended for him to proceed with cycle #4 today as scheduled. He is expected to  complete the radiotherapy later this week. We will see him back for follow-up visit in 4 weeks for evaluation with repeat CT scan of the chest for restaging of his disease. For the molecular studies there was insufficient tissue to be tested by [facility].  We will send blood sample to [facility] for molecular studies.  The patient was advised to call immediately if he has any concerning symptoms in the interval.  Disclaimer: This note was dictated with voice recognition software. Similar sounding words can inadvertently be transcribed and may be missed upon review. Eilleen Kempf, MD 09/13/18

## 2018-09-14 ENCOUNTER — Telehealth: Payer: Self-pay | Admitting: Physician Assistant

## 2018-09-14 ENCOUNTER — Ambulatory Visit: Payer: Medicare Other

## 2018-09-14 ENCOUNTER — Other Ambulatory Visit: Payer: Self-pay

## 2018-09-14 ENCOUNTER — Ambulatory Visit
Admission: RE | Admit: 2018-09-14 | Discharge: 2018-09-14 | Disposition: A | Discharge status: Home / Self Care | Payer: Medicare Other | Source: Ambulatory Visit | Attending: Radiation Oncology | Admitting: Radiation Oncology

## 2018-09-14 DIAGNOSIS — Z510 Encounter for antineoplastic radiation therapy: Secondary | ICD-10-CM | POA: Diagnosis not present

## 2018-09-14 NOTE — Telephone Encounter (Signed)
Scheduled appt per 5/11 LOS - reminder letter sent in the mail with appt date and time

## 2018-09-15 ENCOUNTER — Ambulatory Visit
Admission: RE | Admit: 2018-09-15 | Discharge: 2018-09-15 | Disposition: A | Discharge status: Home / Self Care | Payer: Medicare Other | Source: Ambulatory Visit | Attending: Radiation Oncology | Admitting: Radiation Oncology

## 2018-09-15 ENCOUNTER — Other Ambulatory Visit: Payer: Self-pay

## 2018-09-15 DIAGNOSIS — Z510 Encounter for antineoplastic radiation therapy: Secondary | ICD-10-CM | POA: Diagnosis not present

## 2018-09-20 ENCOUNTER — Other Ambulatory Visit: Payer: Medicare Other

## 2018-09-20 ENCOUNTER — Ambulatory Visit: Payer: Medicare Other

## 2018-09-22 ENCOUNTER — Other Ambulatory Visit: Payer: Self-pay | Admitting: Radiation Oncology

## 2018-09-22 MED ORDER — OXYCODONE-ACETAMINOPHEN 5-325 MG PO TABS: 1.0000 | ORAL_TABLET | ORAL | 0 refills | Status: DC | PRN

## 2018-09-22 MED ORDER — LIDOCAINE VISCOUS HCL 2 % MT SOLN: 15.0000 mL | Freq: Four times a day (QID) | OROMUCOSAL | 2 refills | Status: AC | PRN

## 2018-09-28 ENCOUNTER — Inpatient Hospital Stay: Payer: Medicare Other

## 2018-10-12 ENCOUNTER — Other Ambulatory Visit: Payer: Self-pay

## 2018-10-12 ENCOUNTER — Ambulatory Visit (HOSPITAL_COMMUNITY)
Admission: RE | Admit: 2018-10-12 | Discharge: 2018-10-12 | Disposition: A | Discharge status: Home / Self Care | Payer: Medicare Other | Source: Ambulatory Visit | Attending: Physician Assistant | Admitting: Physician Assistant

## 2018-10-12 ENCOUNTER — Telehealth: Payer: Self-pay | Admitting: Internal Medicine

## 2018-10-12 DIAGNOSIS — C3491 Malignant neoplasm of unspecified part of right bronchus or lung: Secondary | ICD-10-CM | POA: Diagnosis present

## 2018-10-12 MED ORDER — SODIUM CHLORIDE (PF) 0.9 % IJ SOLN
INTRAMUSCULAR | Status: AC
  Filled 2018-10-12: qty 50

## 2018-10-12 MED ORDER — IOHEXOL 300 MG/ML  SOLN
75.0000 mL | Freq: Once | INTRAMUSCULAR | Status: AC | PRN
  Administered 2018-10-12: 75 mL via INTRAVENOUS

## 2018-10-12 NOTE — Telephone Encounter (Signed)
Scheduled appt per sch msg. Called and left msg.  °

## 2018-10-15 ENCOUNTER — Telehealth: Payer: Self-pay | Admitting: Medical Oncology

## 2018-10-15 NOTE — Telephone Encounter (Signed)
Hemotpysis-Woke up and coughed up a streak of blood in clear sputum then gargled and a little more blood came out  then 3rd cough no blood streaks. Denies dizziness, chest pain ,sob, light headedness. I instructed pt to monitor for now and to call if blood amount  increases ,he has chest pain , sob, then go to ED.

## 2018-10-17 NOTE — Progress Notes (Addendum)
Radiation Oncology         [phone] ________________________________  Name: Zachary Knox MRN: [id number]  Date: 10/18/2018  DOB: 27-May-1950  Follow-Up Visit Note  CC: Vincente Liberty, MD  Curt Bears, MD    ICD-10-CM   1. 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 He reports pain in his back rated 7/10, one episode of hemoptysis, shortness of breath with activity, and lingering fatigue associated with radiation. He denies cough and difficulty breathing.   Since his last visit, he underwent chest CT on 10/12/2018. This showed: slight interval decrease in size of the right lower lobe lung lesion (3.3 versus 3.7 cm); improved right hilar and mediastinal adenopathy, no new/progressive findings; healing changes involving the right 8th rib lesion, no new bone lesions are identified; no findings for pulmonary metastatic disease; patchy vague ground-glass opacities in the right upper lobe and right middle lobe, likely inflammatory; stable advanced emphysematous changes, pulmonary scarring, aortic and coronary artery calcifications.   ALLERGIES:  has No Known Allergies.  Meds: Current Outpatient Medications  Medication Sig Dispense Refill   acetaminophen (TYLENOL) 500 MG tablet Take 1,000 mg by mouth every 6 (six) hours as needed for mild pain.      amLODipine (NORVASC) 5 MG tablet Take 5 mg by mouth daily.     aspirin EC 81 MG tablet Take 81 mg by mouth daily.      atorvastatin (LIPITOR) 20 MG tablet Take 20 mg by  mouth daily.     carvedilol (COREG) 3.125 MG tablet Take 3.125 mg by mouth 2 (two) times daily with a meal.     diphenhydrAMINE (BENADRYL) 25 MG tablet Take 1 tablet (25 mg total) by mouth every 6 (six) hours. 20 tablet 0   famotidine (PEPCID) 20 MG tablet Take 1 tablet (20 mg total) by mouth 2 (two) times daily. 10 tablet 0   gabapentin (NEURONTIN) 400 MG capsule Take 1 capsule by mouth every 8 (eight) hours.     ibuprofen (ADVIL,MOTRIN) 200 MG tablet Take 200 mg by mouth every 6 (six) hours as needed for moderate pain.     levothyroxine (SYNTHROID, LEVOTHROID) 100 MCG tablet Take 100 mcg by mouth daily.     lidocaine (XYLOCAINE) 2 % solution Use as directed 15 mLs in the mouth or throat every 6 (six) hours as needed (mouth/throat pain). Swish and swallow 100 mL 2   metFORMIN (GLUCOPHAGE) 1000 MG tablet Take 500-1,000 mg by mouth See admin instructions. Take 1000mg  in the am and 500mg  in the pm.     metoprolol succinate (TOPROL-XL) 100 MG 24 hr tablet Take 100 mg by mouth daily. Take with or immediately following a meal.     oxyCODONE-acetaminophen (PERCOCET/ROXICET) 5-325 MG tablet Take 1 tablet by mouth every 4 (four) hours as needed for severe pain. 30 tablet 0   pantoprazole (PROTONIX) 40 MG tablet Take 40 mg by mouth 2 (two) times daily.     ramipril (ALTACE) 10 MG capsule Take 10 mg by mouth daily.  sucralfate (CARAFATE) 1 g tablet Take 1 tablet (1 g total) by mouth 4 (four) times daily -  with meals and at bedtime. Crush and dissolve in 10 mL of water prior to swallowing 60 tablet 1   TOUJEO SOLOSTAR 300 UNIT/ML SOPN Inject 18 Units into the skin every morning.     traMADol (ULTRAM) 50 MG tablet Take 1 tablet (50 mg total) by mouth every 6 (six) hours as needed. (Patient taking differently: Take 50 mg by mouth every 6 (six) hours as needed for moderate pain. ) 20 tablet 0   prochlorperazine (COMPAZINE) 10 MG tablet Take 1 tablet (10 mg total) by mouth every 6 (six) hours as  needed for nausea or vomiting. (Patient not taking: Reported on 08/30/2018) 30 tablet 1   No current facility-administered medications for this encounter.     Physical Findings: The patient is in no acute distress. Patient is alert and oriented.  height is 5\' 9"  (1.753 m) and weight is 173 lb 6.4 oz (78.7 kg). His oral temperature is 98 F (36.7 C). His blood pressure is 119/92 (abnormal) and his pulse is 76. His respiration is 20 and oxygen saturation is 100%.   Lungs are clear to auscultation bilaterally. Heart has regular rate and rhythm. No palpable cervical, supraclavicular, or axillary adenopathy. Abdomen soft, non-tender, normal bowel sounds.   Lab Findings: Lab Results  Component Value Date   WBC 3.2 (L) 09/13/2018   HGB 12.7 (L) 09/13/2018   HCT 37.7 (L) 09/13/2018   MCV 93.3 09/13/2018   PLT 201 09/13/2018    Radiographic Findings: Ct Chest W Contrast  Result Date: 10/12/2018 CLINICAL DATA:  Restaging lung cancer. Initial diagnosis March 2020. Status post chemotherapy and radiation therapy. EXAM: CT CHEST WITH CONTRAST TECHNIQUE: Multidetector CT imaging of the chest was performed during intravenous contrast administration. CONTRAST:  65mL OMNIPAQUE IOHEXOL 300 MG/ML  SOLN COMPARISON:  Chest CT 07/20/2018 and PET-CT 07/20/2018 FINDINGS: Cardiovascular: The heart is normal in size and stable. No pericardial effusion. There is stable tortuosity, ectasia and atherosclerotic calcification involving the thoracic aorta. No focal aneurysm or dissection. Extensive three-vessel coronary artery calcifications are again noted. Mediastinum/Nodes: Stable 9 mm precarinal lymph node on image number 58. This was mildly hypermetabolic on the prior PET-CT. The right hilar node adjacent to the right main pulmonary artery on the prior CT scan is no longer identified. The more inferior posterior node is low-attenuation and measures 10 mm. It previously measured 12 mm. It was hypermetabolic on the prior  PET-CT. On the prior CT and PET-CT there was a hypermetabolic 11 mm subcarinal lymph node but this is no longer identified. The esophagus is grossly normal. Lungs/Pleura: Spiculated superior segment right lower lobe lung mass measures 3.3 x 2.4 cm. This measured 3.7 x 2.5 cm on the most recent prior CT scan. Stable advanced bullous emphysema and areas of pulmonary scarring. I do not see any new pulmonary lesions or new pulmonary nodules to suggest pulmonary metastatic disease. Scattered the ground-glass opacities noted in the right upper lobe and right middle lobe, likely inflammatory but attention on future scans is suggested. Scarring changes in the right lower lobe near the pulmonary lesion, likely radiation related. No pleural effusions. Upper Abdomen: No significant upper abdominal findings. No worrisome hepatic or adrenal gland lesions to suggest metastatic disease. Stable right renal cyst. Musculoskeletal: The right eighth rib lesion demonstrates interval healing changes with progressive sclerosis. Small central lucency remains. Adjacent vague density in the overlying pleura appears to  be calcification or callus formation. No new lytic or sclerotic bone lesions are identified. IMPRESSION: 1. Slight interval decrease in size of the right lower lobe lung lesion (3.3 x 2.4 versus 3.7 x 2.5 cm). 2. Improved right hilar and mediastinal adenopathy. No new/progressive findings. 3. Healing changes involving the right eighth rib lesion. No new bone lesions are identified. 4. No findings for pulmonary metastatic disease. 5. Patchy vague ground-glass opacities in the right upper lobe and right middle lobe, likely inflammatory but attention on future scans is suggested. 6. Stable advanced emphysematous changes and pulmonary scarring. 7. Stable advanced aortic and coronary artery calcifications for age. Aortic Atherosclerosis (ICD10-I70.0) and Emphysema (ICD10-J43.9). Electronically Signed   By: Marijo Sanes M.D.   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  Partial response to his chest radiation and radiosensitizing chemotherapy.  The patient's pain along the right lower rib cage is better with his radiation treatment.  I have given him 1 more refill on his pain medication, he is only requiring approximately once or twice every couple of days.  Plan: PRN follow-up in radiation oncology.  The patient will meet with Dr. Julien Nordmann later this week for discussion of additional therapy.  ____________________________________ Gery Pray, MD   This document serves as a record of services personally performed by Gery Pray, MD. It was created on his behalf by Wilburn Mylar, a trained medical scribe. The creation of this record is based on the scribe's personal observations and the provider's statements to them. This document has been checked and approved by the attending provider.

## 2018-10-18 ENCOUNTER — Other Ambulatory Visit: Payer: Self-pay

## 2018-10-18 ENCOUNTER — Encounter: Payer: Self-pay | Admitting: Radiation Oncology

## 2018-10-18 ENCOUNTER — Ambulatory Visit
Admission: RE | Admit: 2018-10-18 | Discharge: 2018-10-18 | Disposition: A | Discharge status: Home / Self Care | Payer: Medicare Other | Source: Ambulatory Visit | Attending: Radiation Oncology | Admitting: Radiation Oncology

## 2018-10-18 VITALS — BP 119/92 | HR 76 | Temp 98.0°F | Resp 20 | Ht 69.0 in | Wt 173.4 lb

## 2018-10-18 DIAGNOSIS — Z79899 Other long term (current) drug therapy: Secondary | ICD-10-CM | POA: Diagnosis not present

## 2018-10-18 DIAGNOSIS — R5383 Other fatigue: Secondary | ICD-10-CM | POA: Diagnosis not present

## 2018-10-18 DIAGNOSIS — C3481 Malignant neoplasm of overlapping sites of right bronchus and lung: Secondary | ICD-10-CM | POA: Diagnosis not present

## 2018-10-18 DIAGNOSIS — C3491 Malignant neoplasm of unspecified part of right bronchus or lung: Secondary | ICD-10-CM

## 2018-10-18 DIAGNOSIS — M549 Dorsalgia, unspecified: Secondary | ICD-10-CM | POA: Diagnosis not present

## 2018-10-18 DIAGNOSIS — C7951 Secondary malignant neoplasm of bone: Secondary | ICD-10-CM | POA: Diagnosis not present

## 2018-10-18 DIAGNOSIS — J439 Emphysema, unspecified: Secondary | ICD-10-CM | POA: Diagnosis not present

## 2018-10-18 DIAGNOSIS — I700 Atherosclerosis of aorta: Secondary | ICD-10-CM | POA: Diagnosis not present

## 2018-10-18 DIAGNOSIS — Z923 Personal history of irradiation: Secondary | ICD-10-CM | POA: Insufficient documentation

## 2018-10-18 MED ORDER — OXYCODONE-ACETAMINOPHEN 5-325 MG PO TABS: 1.0000 | ORAL_TABLET | ORAL | 0 refills | Status: DC | PRN

## 2018-10-18 NOTE — Progress Notes (Signed)
Zachary Knox presents today for f/u with Dr. Sondra Come. Pt reports pain in back, rated 7/10. Pt denies cough. Pt reports one episode of hemoptysis last week, wife had contacted Dr. Worthy Flank office. Hemoptysis resolved. Pt denies difficulty breathing, reports SOB with increased activity, such as lawn work. Pt c/o lingering fatigue associated with radiation.   BP (!) 119/92 (BP Location: Left Arm, Patient Position: Sitting)   Pulse 76   Temp 98 F (36.7 C) (Oral)   Resp 20   Ht 5\' 9"  (1.753 m)   Wt 173 lb 6.4 oz (78.7 kg)   SpO2 100%   BMI 25.61 kg/m   Wt Readings from Last 3 Encounters:  10/18/18 173 lb 6.4 oz (78.7 kg)  09/13/18 178 lb 4.8 oz (80.9 kg)  09/06/18 175 lb (79.4 kg)   Loma Sousa, RN BSN

## 2018-10-18 NOTE — Patient Instructions (Signed)
Coronavirus (COVID-19) Are you at risk?  Are you at risk for the Coronavirus (COVID-19)?  To be considered HIGH RISK for Coronavirus (COVID-19), you have to meet the following criteria:  . Traveled to [place], [place], [place], [place] or [place]; or in the [place] to [place], [place], [place], or [place]; and have fever, cough, and shortness of breath within the last 2 weeks of travel OR . Been in close contact with a person diagnosed with COVID-19 within the last 2 weeks and have fever, cough, and shortness of breath . IF YOU DO NOT MEET THESE CRITERIA, YOU ARE CONSIDERED LOW RISK FOR COVID-19.  What to do if you are HIGH RISK for COVID-19?  . If you are having a medical emergency, call 911. . Seek medical care right away. Before you go to a doctor's office, urgent care or emergency department, call ahead and tell them about your recent travel, contact with someone diagnosed with COVID-19, and your symptoms. You should receive instructions from your physician's office regarding next steps of care.  . When you arrive at healthcare provider, tell the healthcare staff immediately you have returned from visiting [place], [place], [place], [place] or [place]; or traveled in the [place] to [place], [place], [place], or [place]; in the last two weeks or you have been in close contact with a person diagnosed with COVID-19 in the last 2 weeks.   . Tell the health care staff about your symptoms: fever, cough and shortness of breath. . After you have been seen by a medical provider, you will be either: o Tested for (COVID-19) and discharged home on quarantine except to seek medical care if symptoms worsen, and asked to  - Stay home and avoid contact with others until you get your results (4-5 days)  - Avoid travel on public transportation if possible (such as bus, train, or airplane) or o Sent to the Emergency Department by EMS for evaluation, COVID-19 testing, and possible  admission depending on your condition and test results.  What to do if you are LOW RISK for COVID-19?  Reduce your risk of any infection by using the same precautions used for avoiding the common cold or flu:  . Wash your hands often with soap and warm water for at least 20 seconds.  If soap and water are not readily available, use an alcohol-based hand sanitizer with at least 60% alcohol.  . If coughing or sneezing, cover your mouth and nose by coughing or sneezing into the elbow areas of your shirt or coat, into a tissue or into your sleeve (not your hands). . Avoid shaking hands with others and consider head nods or verbal greetings only. . Avoid touching your eyes, nose, or mouth with unwashed hands.  . Avoid close contact with people who are sick. . Avoid places or events with large numbers of people in one location, like concerts or sporting events. . Carefully consider travel plans you have or are making. . If you are planning any travel outside or inside the [place], visit the CDC's Travelers' Health webpage for the latest health notices. . If you have some symptoms but not all symptoms, continue to monitor at home and seek medical attention if your symptoms worsen. . If you are having a medical emergency, call 911.   ADDITIONAL HEALTHCARE OPTIONS FOR PATIENTS   Telehealth / e-Visit: [email or web address]         [facility]: [phone]  [facility]: [phone]                   [facility]: [phone]   

## 2018-10-20 ENCOUNTER — Encounter: Payer: Self-pay | Admitting: Internal Medicine

## 2018-10-20 ENCOUNTER — Inpatient Hospital Stay: Payer: Medicare Other | Attending: Internal Medicine | Admitting: Internal Medicine

## 2018-10-20 ENCOUNTER — Other Ambulatory Visit: Payer: Self-pay

## 2018-10-20 ENCOUNTER — Inpatient Hospital Stay: Payer: Medicare Other

## 2018-10-20 VITALS — BP 119/85 | HR 72 | Temp 99.1°F | Resp 18 | Ht 69.0 in | Wt 174.0 lb

## 2018-10-20 DIAGNOSIS — Z5112 Encounter for antineoplastic immunotherapy: Secondary | ICD-10-CM | POA: Insufficient documentation

## 2018-10-20 DIAGNOSIS — C7951 Secondary malignant neoplasm of bone: Secondary | ICD-10-CM | POA: Insufficient documentation

## 2018-10-20 DIAGNOSIS — R079 Chest pain, unspecified: Secondary | ICD-10-CM | POA: Insufficient documentation

## 2018-10-20 DIAGNOSIS — Z79899 Other long term (current) drug therapy: Secondary | ICD-10-CM | POA: Insufficient documentation

## 2018-10-20 DIAGNOSIS — C3491 Malignant neoplasm of unspecified part of right bronchus or lung: Secondary | ICD-10-CM

## 2018-10-20 DIAGNOSIS — C3431 Malignant neoplasm of lower lobe, right bronchus or lung: Secondary | ICD-10-CM | POA: Diagnosis present

## 2018-10-20 DIAGNOSIS — R0602 Shortness of breath: Secondary | ICD-10-CM | POA: Insufficient documentation

## 2018-10-20 DIAGNOSIS — Z7189 Other specified counseling: Secondary | ICD-10-CM

## 2018-10-20 DIAGNOSIS — Z5111 Encounter for antineoplastic chemotherapy: Secondary | ICD-10-CM | POA: Diagnosis present

## 2018-10-20 LAB — CBC WITH DIFFERENTIAL (CANCER CENTER ONLY)
Abs Immature Granulocytes: 0.03 10*3/uL (ref 0.00–0.07)
Basophils Absolute: 0.1 10*3/uL (ref 0.0–0.1)
Basophils Relative: 1 %
Eosinophils Absolute: 0.2 10*3/uL (ref 0.0–0.5)
Eosinophils Relative: 3 %
HCT: 35.9 % — ABNORMAL LOW (ref 39.0–52.0)
Hemoglobin: 12 g/dL — ABNORMAL LOW (ref 13.0–17.0)
Immature Granulocytes: 1 %
Lymphocytes Relative: 21 %
Lymphs Abs: 1 10*3/uL (ref 0.7–4.0)
MCH: 31.9 pg (ref 26.0–34.0)
MCHC: 33.4 g/dL (ref 30.0–36.0)
MCV: 95.5 fL (ref 80.0–100.0)
Monocytes Absolute: 0.5 10*3/uL (ref 0.1–1.0)
Monocytes Relative: 10 %
Neutro Abs: 2.9 10*3/uL (ref 1.7–7.7)
Neutrophils Relative %: 64 %
Platelet Count: 184 10*3/uL (ref 150–400)
RBC: 3.76 MIL/uL — ABNORMAL LOW (ref 4.22–5.81)
RDW: 15 % (ref 11.5–15.5)
WBC Count: 4.6 10*3/uL (ref 4.0–10.5)
nRBC: 0 % (ref 0.0–0.2)

## 2018-10-20 LAB — CMP (CANCER CENTER ONLY)
ALT: 18 U/L (ref 0–44)
AST: 19 U/L (ref 15–41)
Albumin: 3.2 g/dL — ABNORMAL LOW (ref 3.5–5.0)
Alkaline Phosphatase: 70 U/L (ref 38–126)
Anion gap: 9 (ref 5–15)
BUN: 12 mg/dL (ref 8–23)
CO2: 25 mmol/L (ref 22–32)
Calcium: 9 mg/dL (ref 8.9–10.3)
Chloride: 104 mmol/L (ref 98–111)
Creatinine: 1.01 mg/dL (ref 0.61–1.24)
GFR, Est AFR Am: >60 mL/min (ref >60)
GFR, Estimated: >60 mL/min (ref >60)
Glucose, Bld: 189 mg/dL — ABNORMAL HIGH (ref 70–99)
Potassium: 4.2 mmol/L (ref 3.5–5.1)
Sodium: 138 mmol/L (ref 135–145)
Total Bilirubin: 0.6 mg/dL (ref 0.3–1.2)
Total Protein: 6.6 g/dL (ref 6.5–8.1)

## 2018-10-20 MED ORDER — LIDOCAINE-PRILOCAINE 2.5-2.5 % EX CREA: 1.0000 "application " | TOPICAL_CREAM | Freq: Once | CUTANEOUS | 0 refills | Status: AC

## 2018-10-20 MED ORDER — CYANOCOBALAMIN 1000 MCG/ML IJ SOLN
INTRAMUSCULAR | Status: AC
  Filled 2018-10-20: qty 1

## 2018-10-20 MED ORDER — FOLIC ACID 1 MG PO TABS: 1.0000 mg | ORAL_TABLET | Freq: Every day | ORAL | 4 refills | Status: AC

## 2018-10-20 MED ORDER — CYANOCOBALAMIN 1000 MCG/ML IJ SOLN
1000.0000 ug | Freq: Once | INTRAMUSCULAR | Status: AC
  Administered 2018-10-20: 09:00:00 1000 ug via INTRAMUSCULAR

## 2018-10-20 NOTE — Progress Notes (Signed)
[place] [phone]   [phone]  OFFICE PROGRESS NOTE  Vincente Liberty, MD [place] [address]  DIAGNOSIS: Stage IV (T2b, N2, M1c) non-small cell lung cancer, adenocarcinoma diagnosed in April 2020 and presented with right lower lobe lung mass as well as right hilar and mediastinal lymphadenopathy as well as bone metastasis in the right eighth rib.  [facility] Molecular study: NTRK1 F383F 0.3% Synonymous Alteration  PRIOR THERAPY: Palliative radiotherapy to the right eighth rib in addition to concurrent chemoradiation with weekly carboplatin for AUC of 2 and paclitaxel 45 mg/M2 to the lung mass and mediastinal lymphadenopathy for the locally advanced disease.  Status post 4 cycle of chemotherapy.  Last cycle was given on 09/13/2018.  CURRENT THERAPY: Systemic chemotherapy with carboplatin for AUC of 5, Alimta 500 mg/M2 and Keytruda 200 mg IV every 3 weeks.  First dose October 27, 2018.   INTERVAL HISTORY: Zachary Knox 68 y.o. male returns to the clinic today for follow-up visit.  His daughter and wife were not available by phone.  The patient is feeling fine today with no concerning complaints except for mild pain on the left side of the chest as well as shortness of breath with exertion.  He denied having any cough or hemoptysis.  She denied having fever or chills.  He has no nausea, vomiting, diarrhea or constipation.  He denied having any headache or visual changes.  He tolerated the previous course of his concurrent chemoradiation fairly well except for mild odynophagia.  He had repeat CT scan of the chest performed recently and he is here for evaluation and discussion of his scan results.  He did have molecular studies performed by guardant 360 and unfortunately it did not show any actionable mutations.   MEDICAL HISTORY: Past Medical History:  Diagnosis Date  . Cancer ([place]) 2020   lung  . Diabetes mellitus  without complication (HCC)     ALLERGIES:  has No Known Allergies.  MEDICATIONS:  Current Outpatient Medications  Medication Sig Dispense Refill  . acetaminophen (TYLENOL) 500 MG tablet Take 1,000 mg by mouth every 6 (six) hours as needed for mild pain.     Marland Kitchen amLODipine (NORVASC) 5 MG tablet Take 5 mg by mouth daily.    Marland Kitchen aspirin EC 81 MG tablet Take 81 mg by mouth daily.     Marland Kitchen atorvastatin (LIPITOR) 20 MG tablet Take 20 mg by mouth daily.    . carvedilol (COREG) 3.125 MG tablet Take 3.125 mg by mouth 2 (two) times daily with a meal.    . diphenhydrAMINE (BENADRYL) 25 MG tablet Take 1 tablet (25 mg total) by mouth every 6 (six) hours. 20 tablet 0  . famotidine (PEPCID) 20 MG tablet Take 1 tablet (20 mg total) by mouth 2 (two) times daily. 10 tablet 0  . gabapentin (NEURONTIN) 400 MG capsule Take 1 capsule by mouth every 8 (eight) hours.    Marland Kitchen ibuprofen (ADVIL,MOTRIN) 200 MG tablet Take 200 mg by mouth every 6 (six) hours as needed for moderate pain.    Marland Kitchen levothyroxine (SYNTHROID, LEVOTHROID) 100 MCG tablet Take 100 mcg by mouth daily.    Marland Kitchen lidocaine (XYLOCAINE) 2 % solution Use as directed 15 mLs in the mouth or throat every 6 (six) hours as needed (mouth/throat pain). Swish and swallow 100 mL 2  . metFORMIN (GLUCOPHAGE) 1000 MG tablet Take 500-1,000 mg by mouth See admin instructions. Take 1030m in the am  and 541m in the pm.    . metoprolol succinate (TOPROL-XL) 100 MG 24 hr tablet Take 100 mg by mouth daily. Take with or immediately following a meal.    . oxyCODONE-acetaminophen (PERCOCET/ROXICET) 5-325 MG tablet Take 1 tablet by mouth every 4 (four) hours as needed for severe pain. 30 tablet 0  . pantoprazole (PROTONIX) 40 MG tablet Take 40 mg by mouth 2 (two) times daily.    . prochlorperazine (COMPAZINE) 10 MG tablet Take 1 tablet (10 mg total) by mouth every 6 (six) hours as needed for nausea or vomiting. (Patient not taking: Reported on 08/30/2018) 30 tablet 1  . ramipril (ALTACE) 10  MG capsule Take 10 mg by mouth daily.    . sucralfate (CARAFATE) 1 g tablet Take 1 tablet (1 g total) by mouth 4 (four) times daily -  with meals and at bedtime. Crush and dissolve in 10 mL of water prior to swallowing 60 tablet 1  . TOUJEO SOLOSTAR 300 UNIT/ML SOPN Inject 18 Units into the skin every morning.    . traMADol (ULTRAM) 50 MG tablet Take 1 tablet (50 mg total) by mouth every 6 (six) hours as needed. (Patient taking differently: Take 50 mg by mouth every 6 (six) hours as needed for moderate pain. ) 20 tablet 0   No current facility-administered medications for this visit.     SURGICAL HISTORY:  Past Surgical History:  Procedure Laterality Date  . CARDIAC SURGERY     Heart attack in 2008 with stent placement  . stent in heart      REVIEW OF SYSTEMS:  Constitutional: positive for fatigue Eyes: negative Ears, nose, mouth, throat, and face: negative Respiratory: positive for dyspnea on exertion and pleurisy/chest pain Cardiovascular: negative Gastrointestinal: negative Genitourinary:negative Integument/breast: negative Hematologic/lymphatic: negative Musculoskeletal:negative Neurological: negative Behavioral/Psych: negative Endocrine: negative Allergic/Immunologic: negative   PHYSICAL EXAMINATION: General appearance: alert, cooperative, fatigued and no distress Head: Normocephalic, without obvious abnormality, atraumatic Neck: no adenopathy, no JVD, supple, symmetrical, trachea midline and thyroid not enlarged, symmetric, no tenderness/mass/nodules Lymph nodes: Cervical, supraclavicular, and axillary nodes normal. Resp: clear to auscultation bilaterally Back: symmetric, no curvature. ROM normal. No CVA tenderness. Cardio: regular rate and rhythm, S1, S2 normal, no murmur, click, rub or gallop GI: soft, non-tender; bowel sounds normal; no masses,  no organomegaly Extremities: extremities normal, atraumatic, no cyanosis or edema Neurologic: Alert and oriented X 3, normal  strength and tone. Normal symmetric reflexes. Normal coordination and gait  ECOG PERFORMANCE STATUS: 1 - Symptomatic but completely ambulatory  Blood pressure 119/85, pulse 72, temperature 99.1 F (37.3 C), temperature source Oral, resp. rate 18, height _0  (1.753 m), weight 174 lb (78.9 kg), SpO2 100 %.  LABORATORY DATA: Lab Results  Component Value Date   WBC 4.6 10/20/2018   HGB 12.0 (L) 10/20/2018   HCT 35.9 (L) 10/20/2018   MCV 95.5 10/20/2018   PLT 184 10/20/2018      Chemistry      Component Value Date/Time   NA 138 09/13/2018 0732   K 3.9 09/13/2018 0732   CL 108 09/13/2018 0732   CO2 20 (L) 09/13/2018 0732   BUN 11 09/13/2018 0732   CREATININE 0.85 09/13/2018 0732      Component Value Date/Time   CALCIUM 8.6 (L) 09/13/2018 0732   ALKPHOS 69 09/13/2018 0732   AST 22 09/13/2018 0732   ALT 35 09/13/2018 0732   BILITOT 1.0 09/13/2018 0732       RADIOGRAPHIC STUDIES: Ct Chest W Contrast  Result Date: 10/12/2018 CLINICAL DATA:  Restaging lung cancer. Initial diagnosis March 2020. Status post chemotherapy and radiation therapy. EXAM: CT CHEST WITH CONTRAST TECHNIQUE: Multidetector CT imaging of the chest was performed during intravenous contrast administration. CONTRAST:  63m OMNIPAQUE IOHEXOL 300 MG/ML  SOLN COMPARISON:  Chest CT 07/20/2018 and PET-CT 07/20/2018 FINDINGS: Cardiovascular: The heart is normal in size and stable. No pericardial effusion. There is stable tortuosity, ectasia and atherosclerotic calcification involving the thoracic aorta. No focal aneurysm or dissection. Extensive three-vessel coronary artery calcifications are again noted. Mediastinum/Nodes: Stable 9 mm precarinal lymph node on image number 58. This was mildly hypermetabolic on the prior PET-CT. The right hilar node adjacent to the right main pulmonary artery on the prior CT scan is no longer identified. The more inferior posterior node is low-attenuation and measures 10 mm. It previously  measured 12 mm. It was hypermetabolic on the prior PET-CT. On the prior CT and PET-CT there was a hypermetabolic 11 mm subcarinal lymph node but this is no longer identified. The esophagus is grossly normal. Lungs/Pleura: Spiculated superior segment right lower lobe lung mass measures 3.3 x 2.4 cm. This measured 3.7 x 2.5 cm on the most recent prior CT scan. Stable advanced bullous emphysema and areas of pulmonary scarring. I do not see any new pulmonary lesions or new pulmonary nodules to suggest pulmonary metastatic disease. Scattered the ground-glass opacities noted in the right upper lobe and right middle lobe, likely inflammatory but attention on future scans is suggested. Scarring changes in the right lower lobe near the pulmonary lesion, likely radiation related. No pleural effusions. Upper Abdomen: No significant upper abdominal findings. No worrisome hepatic or adrenal gland lesions to suggest metastatic disease. Stable right renal cyst. Musculoskeletal: The right eighth rib lesion demonstrates interval healing changes with progressive sclerosis. Small central lucency remains. Adjacent vague density in the overlying pleura appears to be calcification or callus formation. No new lytic or sclerotic bone lesions are identified. IMPRESSION: 1. Slight interval decrease in size of the right lower lobe lung lesion (3.3 x 2.4 versus 3.7 x 2.5 cm). 2. Improved right hilar and mediastinal adenopathy. No new/progressive findings. 3. Healing changes involving the right eighth rib lesion. No new bone lesions are identified. 4. No findings for pulmonary metastatic disease. 5. Patchy vague ground-glass opacities in the right upper lobe and right middle lobe, likely inflammatory but attention on future scans is suggested. 6. Stable advanced emphysematous changes and pulmonary scarring. 7. Stable advanced aortic and coronary artery calcifications for age. Aortic Atherosclerosis (ICD10-I70.0) and Emphysema (ICD10-J43.9).  Electronically Signed   By: PMarijo SanesM.D.   On: 10/12/2018 11:23    ASSESSMENT AND PLAN: This is a very pleasant 68years old African-American male recently diagnosed with stage IV (T2b, N2, M1c) non-small cell lung cancer, adenocarcinoma diagnosed in April 2020 and presented with right lower lobe lung mass in addition to mediastinal lymphadenopathy and metastatic disease to the right eighth rib. He underwent a course of concurrent chemoradiation to the locally advanced disease in addition to palliative radiotherapy to the solitary right rib bone lesion. The patient tolerated his treatment well except for mild odynophagia. He had repeat CT scan of the chest performed recently.  I personally and independently reviewed the scans and discussed the results with the patient and his family today. His scan showed some improvement of his disease in the mediastinal lymph node as well as the right upper lobe lung mass. I had a lengthy discussion with the patient and  his family today about his current condition and further treatment options. I recommended for the patient to consider systemic chemotherapy with carboplatin for AUC of 5, Alimta 500 mg/M2 and Keytruda 200 mg IgG every 3 weeks. I discussed with the patient the adverse effect of this treatment including but not limited to alopecia, myelosuppression, nausea vomiting, peripheral neuropathy, liver or renal dysfunction as well as the adverse effect of immunotherapy. He is expected to start the first cycle of this treatment next week. He will receive vitamin B12 injection today. I will call his pharmacy with prescription for folic acid 1 mg p.o. daily in addition to EMLA cream to be applied to the Port-A-Cath before treatment. I will refer the patient to interventional radiology for Port-A-Cath placement. I will see the patient back for follow-up visit in 2 weeks for evaluation and management of any adverse effect of his treatment. He was advised to  call immediately if he has any concerning symptoms in the interval. The patient voices understanding of current disease status and treatment options and is in agreement with the current care plan. All questions were answered. The patient knows to call the clinic with any problems, questions or concerns. We can certainly see the patient much sooner if necessary.  I spent 10 minutes counseling the patient face to face. The total time spent in the appointment was 15 minutes.  Disclaimer: This note was dictated with voice recognition software. Similar sounding words can inadvertently be transcribed and may not be corrected upon review.

## 2018-10-20 NOTE — Progress Notes (Signed)
DISCONTINUE ON PATHWAY REGIMEN - Non-Small Cell Lung     Administer weekly:     Paclitaxel      Carboplatin   **Always confirm dose/schedule in your pharmacy ordering system**  REASON: Other Reason PRIOR TREATMENT: [id number]: Carboplatin AUC=2 + Paclitaxel 45 mg/m2 Weekly During Radiation TREATMENT RESPONSE: Stable Disease (SD)  START ON PATHWAY REGIMEN - Non-Small Cell Lung     A cycle is every 21 days:     Pembrolizumab      Pemetrexed      Carboplatin   **Always confirm dose/schedule in your pharmacy ordering system**  Patient Characteristics: Stage IV Metastatic, Nonsquamous, Initial Chemotherapy/Immunotherapy, PS = 0, 1, ALK Rearrangement Negative and EGFR Mutation Negative/Non-Sensitizing, PD-L1 Expression Positive 1-49% (TPS) / Negative / Not Tested / Awaiting Test Results and  Immunotherapy Candidate AJCC T Category: T2b Current Disease Status: Distant Metastases AJCC N Category: N2 AJCC M Category: M1c AJCC 8 Stage Grouping: IVB Histology: Nonsquamous Cell ROS1 Rearrangement Status: Negative T790M Mutation Status: Not Applicable - EGFR Mutation Negative/Unknown Other Mutations/Biomarkers: No Other Actionable Mutations NTRK Gene Fusion Status: Negative PD-L1 Expression Status: Quantity Not Sufficient Chemotherapy/Immunotherapy LOT: Initial Chemotherapy/Immunotherapy Molecular Targeted Therapy: Not Appropriate ALK Rearrangement Status: Negative EGFR Mutation Status: Negative/Wild Type BRAF V600E Mutation Status: Negative ECOG Performance Status: 1 Immunotherapy Candidate Status: Candidate for Immunotherapy Intent of Therapy: Non-Curative / Palliative Intent, Discussed with Patient

## 2018-10-21 ENCOUNTER — Encounter: Payer: Self-pay | Admitting: Radiation Oncology

## 2018-10-21 NOTE — Progress Notes (Signed)
  Radiation Oncology         [phone] ________________________________  Name: RENNE PLATTS MRN: [id number]  Date: 10/21/2018  DOB: May 02, 1951  End of Treatment Note  Diagnosis:   Malignant neoplasm of lower lobe, right bronchus or lung     Indication for treatment:  Curative       Radiation treatment dates:  08/04/18-09/14/18  Site/dose:   Right lung; 60 Gy in 30 fractions of 2 Gy  Beams/energy:   3D Photon; 6X, 10X  Narrative: The patient tolerated radiation treatment relatively well. By the end of his treatment he reported SOB with exertion, difficulty with swallowing, and mild fatigue. He denied pain, coughing, and skin issues.    Plan: The patient has completed radiation treatment. The patient will return to radiation oncology clinic for routine followup in one month. I advised them to call or return sooner if they have any questions or concerns related to their recovery or treatment.  -----------------------------------  Blair Promise, PhD, MD  This document serves as a record of services personally performed by Gery Pray, MD. It was created on his behalf by Mary-Margaret Loma Messing, a trained medical scribe. The creation of this record is based on the scribe's personal observations and the provider's statements to them. This document has been checked and approved by the attending provider.

## 2018-10-22 ENCOUNTER — Telehealth: Payer: Self-pay | Admitting: Internal Medicine

## 2018-10-22 NOTE — Telephone Encounter (Signed)
Scheduled per los. Called and spoke with patients wife. Confirmed date and time of appts

## 2018-10-25 ENCOUNTER — Encounter: Payer: Self-pay | Admitting: *Deleted

## 2018-10-26 ENCOUNTER — Telehealth: Payer: Self-pay | Admitting: Medical Oncology

## 2018-10-26 NOTE — Telephone Encounter (Signed)
Letter taken up front for pickup .Unable to leave message about this on daughters phone. Marland Kitchen

## 2018-10-27 ENCOUNTER — Inpatient Hospital Stay: Payer: Medicare Other

## 2018-10-27 ENCOUNTER — Other Ambulatory Visit: Payer: Self-pay

## 2018-10-27 VITALS — BP 118/90 | HR 72 | Temp 97.8°F | Resp 18

## 2018-10-27 DIAGNOSIS — C3491 Malignant neoplasm of unspecified part of right bronchus or lung: Secondary | ICD-10-CM

## 2018-10-27 DIAGNOSIS — Z5112 Encounter for antineoplastic immunotherapy: Secondary | ICD-10-CM | POA: Diagnosis not present

## 2018-10-27 LAB — CBC WITH DIFFERENTIAL (CANCER CENTER ONLY)
Abs Immature Granulocytes: 0.03 10*3/uL (ref 0.00–0.07)
Basophils Absolute: 0.1 10*3/uL (ref 0.0–0.1)
Basophils Relative: 1 %
Eosinophils Absolute: 0.1 10*3/uL (ref 0.0–0.5)
Eosinophils Relative: 2 %
HCT: 35.8 % — ABNORMAL LOW (ref 39.0–52.0)
Hemoglobin: 12.2 g/dL — ABNORMAL LOW (ref 13.0–17.0)
Immature Granulocytes: 1 %
Lymphocytes Relative: 19 %
Lymphs Abs: 1.1 10*3/uL (ref 0.7–4.0)
MCH: 32.3 pg (ref 26.0–34.0)
MCHC: 34.1 g/dL (ref 30.0–36.0)
MCV: 94.7 fL (ref 80.0–100.0)
Monocytes Absolute: 0.6 10*3/uL (ref 0.1–1.0)
Monocytes Relative: 11 %
Neutro Abs: 3.8 10*3/uL (ref 1.7–7.7)
Neutrophils Relative %: 66 %
Platelet Count: 150 10*3/uL (ref 150–400)
RBC: 3.78 MIL/uL — ABNORMAL LOW (ref 4.22–5.81)
RDW: 15.3 % (ref 11.5–15.5)
WBC Count: 5.7 10*3/uL (ref 4.0–10.5)
nRBC: 0 % (ref 0.0–0.2)

## 2018-10-27 LAB — CMP (CANCER CENTER ONLY)
ALT: 25 U/L (ref 0–44)
AST: 23 U/L (ref 15–41)
Albumin: 3.3 g/dL — ABNORMAL LOW (ref 3.5–5.0)
Alkaline Phosphatase: 80 U/L (ref 38–126)
Anion gap: 10 (ref 5–15)
BUN: 5 mg/dL — ABNORMAL LOW (ref 8–23)
CO2: 21 mmol/L — ABNORMAL LOW (ref 22–32)
Calcium: 8.7 mg/dL — ABNORMAL LOW (ref 8.9–10.3)
Chloride: 107 mmol/L (ref 98–111)
Creatinine: 0.9 mg/dL (ref 0.61–1.24)
GFR, Est AFR Am: >60 mL/min (ref >60)
GFR, Estimated: >60 mL/min (ref >60)
Glucose, Bld: 204 mg/dL — ABNORMAL HIGH (ref 70–99)
Potassium: 4 mmol/L (ref 3.5–5.1)
Sodium: 138 mmol/L (ref 135–145)
Total Bilirubin: 0.9 mg/dL (ref 0.3–1.2)
Total Protein: 6.6 g/dL (ref 6.5–8.1)

## 2018-10-27 LAB — TSH: TSH: <0.08 u[IU]/mL — ABNORMAL LOW (ref 0.320–4.118)

## 2018-10-27 MED ORDER — SODIUM CHLORIDE 0.9 % IV SOLN
Freq: Once | INTRAVENOUS | Status: AC
  Administered 2018-10-27: 14:00:00 via INTRAVENOUS
  Filled 2018-10-27: qty 250

## 2018-10-27 MED ORDER — PALONOSETRON HCL INJECTION 0.25 MG/5ML
0.2500 mg | Freq: Once | INTRAVENOUS | Status: AC
  Administered 2018-10-27: 0.25 mg via INTRAVENOUS

## 2018-10-27 MED ORDER — SODIUM CHLORIDE 0.9 % IV SOLN
Freq: Once | INTRAVENOUS | Status: AC
  Administered 2018-10-27: 15:00:00 via INTRAVENOUS
  Filled 2018-10-27: qty 5

## 2018-10-27 MED ORDER — PALONOSETRON HCL INJECTION 0.25 MG/5ML
INTRAVENOUS | Status: AC
  Filled 2018-10-27: qty 5

## 2018-10-27 MED ORDER — SODIUM CHLORIDE 0.9 % IV SOLN
515.5000 mg | Freq: Once | INTRAVENOUS | Status: AC
  Administered 2018-10-27: 520 mg via INTRAVENOUS
  Filled 2018-10-27: qty 52

## 2018-10-27 MED ORDER — SODIUM CHLORIDE 0.9 % IV SOLN
200.0000 mg | Freq: Once | INTRAVENOUS | Status: AC
  Administered 2018-10-27: 200 mg via INTRAVENOUS
  Filled 2018-10-27: qty 8

## 2018-10-27 MED ORDER — SODIUM CHLORIDE 0.9 % IV SOLN
1000.0000 mg | Freq: Once | INTRAVENOUS | Status: AC
  Administered 2018-10-27: 1000 mg via INTRAVENOUS
  Filled 2018-10-27: qty 40

## 2018-10-27 NOTE — Patient Instructions (Signed)
[facility] Discharge Instructions for Patients Receiving Chemotherapy  Today you received the following chemotherapy agents : Keytruda, Alimta, Carboplatin.  To help prevent nausea and vomiting after your treatment, we encourage you to take your nausea medication as prescribed.   If you develop nausea and vomiting that is not controlled by your nausea medication, call the clinic.   BELOW ARE SYMPTOMS THAT SHOULD BE REPORTED IMMEDIATELY:  *FEVER GREATER THAN 100.5 F  *CHILLS WITH OR WITHOUT FEVER  NAUSEA AND VOMITING THAT IS NOT CONTROLLED WITH YOUR NAUSEA MEDICATION  *UNUSUAL SHORTNESS OF BREATH  *UNUSUAL BRUISING OR BLEEDING  TENDERNESS IN MOUTH AND THROAT WITH OR WITHOUT PRESENCE OF ULCERS  *URINARY PROBLEMS  *BOWEL PROBLEMS  UNUSUAL RASH Items with * indicate a potential emergency and should be followed up as soon as possible.  Feel free to call the clinic should you have any questions or concerns. The clinic phone number is [phone].  Please show the [facility] at check-in to the Emergency Department and triage nurse.  Pembrolizumab injection What is this medicine? PEMBROLIZUMAB (pem broe liz ue mab) is a monoclonal antibody. It is used to treat cervical cancer, esophageal cancer, head and neck cancer, hepatocellular cancer, Hodgkin lymphoma, kidney cancer, lymphoma, melanoma, Merkel cell carcinoma, lung cancer, stomach cancer, urothelial cancer, and cancers that have a certain genetic condition. This medicine may be used for other purposes; ask your health care provider or pharmacist if you have questions. COMMON BRAND NAME(S): Keytruda What should I tell my health care provider before I take this medicine? They need to know if you have any of these conditions: -diabetes -immune system problems -inflammatory bowel disease -liver disease -lung or breathing disease -lupus -received or scheduled to receive an organ transplant or a stem-cell  transplant that uses donor stem cells -an unusual or allergic reaction to pembrolizumab, other medicines, foods, dyes, or preservatives -pregnant or trying to get pregnant -breast-feeding How should I use this medicine? This medicine is for infusion into a vein. It is given by a health care professional in a hospital or clinic setting. A special MedGuide will be given to you before each treatment. Be sure to read this information carefully each time. Talk to your pediatrician regarding the use of this medicine in children. While this drug may be prescribed for selected conditions, precautions do apply. Overdosage: If you think you have taken too much of this medicine contact a poison control center or emergency room at once. NOTE: This medicine is only for you. Do not share this medicine with others. What if I miss a dose? It is important not to miss your dose. Call your doctor or health care professional if you are unable to keep an appointment. What may interact with this medicine? Interactions have not been studied. Give your health care provider a list of all the medicines, herbs, non-prescription drugs, or dietary supplements you use. Also tell them if you smoke, drink alcohol, or use illegal drugs. Some items may interact with your medicine. This list may not describe all possible interactions. Give your health care provider a list of all the medicines, herbs, non-prescription drugs, or dietary supplements you use. Also tell them if you smoke, drink alcohol, or use illegal drugs. Some items may interact with your medicine. What should I watch for while using this medicine? Your condition will be monitored carefully while you are receiving this medicine. You may need blood work done while you are taking this medicine. Do not  become pregnant while taking this medicine or for 4 months after stopping it. Women should inform their doctor if they wish to become pregnant or think they might be  pregnant. There is a potential for serious side effects to an unborn child. Talk to your health care professional or pharmacist for more information. Do not breast-feed an infant while taking this medicine or for 4 months after the last dose. What side effects may I notice from receiving this medicine? Side effects that you should report to your doctor or health care professional as soon as possible: -allergic reactions like skin rash, itching or hives, swelling of the face, lips, or tongue -bloody or black, tarry -breathing problems -changes in vision -chest pain -chills -confusion -constipation -cough -diarrhea -dizziness or feeling faint or lightheaded -fast or irregular heartbeat -fever -flushing -hair loss -joint pain -low blood counts - this medicine may decrease the number of white blood cells, red blood cells and platelets. You may be at increased risk for infections and bleeding. -muscle pain -muscle weakness -persistent headache -redness, blistering, peeling or loosening of the skin, including inside the mouth -signs and symptoms of high blood sugar such as dizziness; dry mouth; dry skin; fruity breath; nausea; stomach pain; increased hunger or thirst; increased urination -signs and symptoms of kidney injury like trouble passing urine or change in the amount of urine -signs and symptoms of liver injury like dark urine, light-colored stools, loss of appetite, nausea, right upper belly pain, yellowing of the eyes or skin -sweating -swollen lymph nodes -weight loss Side effects that usually do not require medical attention (report to your doctor or health care professional if they continue or are bothersome): -decreased appetite -muscle pain -tiredness This list may not describe all possible side effects. Call your doctor for medical advice about side effects. You may report side effects to FDA at 1-800-FDA-1088. Where should I keep my medicine? This drug is given in a  hospital or clinic and will not be stored at home. NOTE: This sheet is a summary. It may not cover all possible information. If you have questions about this medicine, talk to your doctor, pharmacist, or health care provider.  2019 Elsevier/Gold Standard (2017-12-03 15:06:10)  Pemetrexed injection What is this medicine? PEMETREXED (PEM e TREX ed) is a chemotherapy drug used to treat lung cancers like non-small cell lung cancer and mesothelioma. It may also be used to treat other cancers. This medicine may be used for other purposes; ask your health care provider or pharmacist if you have questions. COMMON BRAND NAME(S): Alimta What should I tell my health care provider before I take this medicine? They need to know if you have any of these conditions: -infection (especially a virus infection such as chickenpox, cold sores, or herpes) -kidney disease -low blood counts, like low white cell, platelet, or red cell counts -lung or breathing disease, like asthma -radiation therapy -an unusual or allergic reaction to pemetrexed, other medicines, foods, dyes, or preservative -pregnant or trying to get pregnant -breast-feeding How should I use this medicine? This drug is given as an infusion into a vein. It is administered in a hospital or clinic by a specially trained health care professional. Talk to your pediatrician regarding the use of this medicine in children. Special care may be needed. Overdosage: If you think you have taken too much of this medicine contact a poison control center or emergency room at once. NOTE: This medicine is only for you. Do not share this medicine with others.  What if I miss a dose? It is important not to miss your dose. Call your doctor or health care professional if you are unable to keep an appointment. What may interact with this medicine? This medicine may interact with the following medications: -Ibuprofen This list may not describe all possible  interactions. Give your health care provider a list of all the medicines, herbs, non-prescription drugs, or dietary supplements you use. Also tell them if you smoke, drink alcohol, or use illegal drugs. Some items may interact with your medicine. What should I watch for while using this medicine? Visit your doctor for checks on your progress. This drug may make you feel generally unwell. This is not uncommon, as chemotherapy can affect healthy cells as well as cancer cells. Report any side effects. Continue your course of treatment even though you feel ill unless your doctor tells you to stop. In some cases, you may be given additional medicines to help with side effects. Follow all directions for their use. Call your doctor or health care professional for advice if you get a fever, chills or sore throat, or other symptoms of a cold or flu. Do not treat yourself. This drug decreases your body's ability to fight infections. Try to avoid being around people who are sick. This medicine may increase your risk to bruise or bleed. Call your doctor or health care professional if you notice any unusual bleeding. Be careful brushing and flossing your teeth or using a toothpick because you may get an infection or bleed more easily. If you have any dental work done, tell your dentist you are receiving this medicine. Avoid taking products that contain aspirin, acetaminophen, ibuprofen, naproxen, or ketoprofen unless instructed by your doctor. These medicines may hide a fever. Call your doctor or health care professional if you get diarrhea or mouth sores. Do not treat yourself. To protect your kidneys, drink water or other fluids as directed while you are taking this medicine. Do not become pregnant while taking this medicine or for 6 months after stopping it. Women should inform their doctor if they wish to become pregnant or think they might be pregnant. Men should not father a child while taking this medicine and  for 3 months after stopping it. This may interfere with the ability to father a child. You should talk to your doctor or health care professional if you are concerned about your fertility. There is a potential for serious side effects to an unborn child. Talk to your health care professional or pharmacist for more information. Do not breast-feed an infant while taking this medicine or for 1 week after stopping it. What side effects may I notice from receiving this medicine? Side effects that you should report to your doctor or health care professional as soon as possible: -allergic reactions like skin rash, itching or hives, swelling of the face, lips, or tongue -breathing problems -redness, blistering, peeling or loosening of the skin, including inside the mouth -signs and symptoms of bleeding such as bloody or black, tarry stools; red or dark-brown urine; spitting up blood or brown material that looks like coffee grounds; red spots on the skin; unusual bruising or bleeding from the eye, gums, or nose -signs and symptoms of infection like fever or chills; cough; sore throat; pain or trouble passing urine -signs and symptoms of kidney injury like trouble passing urine or change in the amount of urine -signs and symptoms of liver injury like dark yellow or brown urine; general ill feeling or  flu-like symptoms; light-colored stools; loss of appetite; nausea; right upper belly pain; unusually weak or tired; yellowing of the eyes or skin Side effects that usually do not require medical attention (report to your doctor or health care professional if they continue or are bothersome): -constipation -mouth sores -nausea, vomiting -unusually weak or tired This list may not describe all possible side effects. Call your doctor for medical advice about side effects. You may report side effects to FDA at 1-800-FDA-1088. Where should I keep my medicine? This drug is given in a hospital or clinic and will not be  stored at home. NOTE: This sheet is a summary. It may not cover all possible information. If you have questions about this medicine, talk to your doctor, pharmacist, or health care provider.  2019 Elsevier/Gold Standard (2017-06-10 16:11:33)  Carboplatin injection What is this medicine? CARBOPLATIN (KAR boe pla tin) is a chemotherapy drug. It targets fast dividing cells, like cancer cells, and causes these cells to die. This medicine is used to treat ovarian cancer and many other cancers. This medicine may be used for other purposes; ask your health care provider or pharmacist if you have questions. COMMON BRAND NAME(S): Paraplatin What should I tell my health care provider before I take this medicine? They need to know if you have any of these conditions: -blood disorders -hearing problems -kidney disease -recent or ongoing radiation therapy -an unusual or allergic reaction to carboplatin, cisplatin, other chemotherapy, other medicines, foods, dyes, or preservatives -pregnant or trying to get pregnant -breast-feeding How should I use this medicine? This drug is usually given as an infusion into a vein. It is administered in a hospital or clinic by a specially trained health care professional. Talk to your pediatrician regarding the use of this medicine in children. Special care may be needed. Overdosage: If you think you have taken too much of this medicine contact a poison control center or emergency room at once. NOTE: This medicine is only for you. Do not share this medicine with others. What if I miss a dose? It is important not to miss a dose. Call your doctor or health care professional if you are unable to keep an appointment. What may interact with this medicine? -medicines for seizures -medicines to increase blood counts like filgrastim, pegfilgrastim, sargramostim -some antibiotics like amikacin, gentamicin, neomycin, streptomycin, tobramycin -vaccines Talk to your doctor or  health care professional before taking any of these medicines: -acetaminophen -aspirin -ibuprofen -ketoprofen -naproxen This list may not describe all possible interactions. Give your health care provider a list of all the medicines, herbs, non-prescription drugs, or dietary supplements you use. Also tell them if you smoke, drink alcohol, or use illegal drugs. Some items may interact with your medicine. What should I watch for while using this medicine? Your condition will be monitored carefully while you are receiving this medicine. You will need important blood work done while you are taking this medicine. This drug may make you feel generally unwell. This is not uncommon, as chemotherapy can affect healthy cells as well as cancer cells. Report any side effects. Continue your course of treatment even though you feel ill unless your doctor tells you to stop. In some cases, you may be given additional medicines to help with side effects. Follow all directions for their use. Call your doctor or health care professional for advice if you get a fever, chills or sore throat, or other symptoms of a cold or flu. Do not treat yourself. This drug decreases  your body's ability to fight infections. Try to avoid being around people who are sick. This medicine may increase your risk to bruise or bleed. Call your doctor or health care professional if you notice any unusual bleeding. Be careful brushing and flossing your teeth or using a toothpick because you may get an infection or bleed more easily. If you have any dental work done, tell your dentist you are receiving this medicine. Avoid taking products that contain aspirin, acetaminophen, ibuprofen, naproxen, or ketoprofen unless instructed by your doctor. These medicines may hide a fever. Do not become pregnant while taking this medicine. Women should inform their doctor if they wish to become pregnant or think they might be pregnant. There is a potential for  serious side effects to an unborn child. Talk to your health care professional or pharmacist for more information. Do not breast-feed an infant while taking this medicine. What side effects may I notice from receiving this medicine? Side effects that you should report to your doctor or health care professional as soon as possible: -allergic reactions like skin rash, itching or hives, swelling of the face, lips, or tongue -signs of infection - fever or chills, cough, sore throat, pain or difficulty passing urine -signs of decreased platelets or bleeding - bruising, pinpoint red spots on the skin, black, tarry stools, nosebleeds -signs of decreased red blood cells - unusually weak or tired, fainting spells, lightheadedness -breathing problems -changes in hearing -changes in vision -chest pain -high blood pressure -low blood counts - This drug may decrease the number of white blood cells, red blood cells and platelets. You may be at increased risk for infections and bleeding. -nausea and vomiting -pain, swelling, redness or irritation at the injection site -pain, tingling, numbness in the hands or feet -problems with balance, talking, walking -trouble passing urine or change in the amount of urine Side effects that usually do not require medical attention (report to your doctor or health care professional if they continue or are bothersome): -hair loss -loss of appetite -metallic taste in the mouth or changes in taste This list may not describe all possible side effects. Call your doctor for medical advice about side effects. You may report side effects to FDA at 1-800-FDA-1088. Where should I keep my medicine? This drug is given in a hospital or clinic and will not be stored at home. NOTE: This sheet is a summary. It may not cover all possible information. If you have questions about this medicine, talk to your doctor, pharmacist, or health care provider.  2019 Elsevier/Gold Standard  (2007-07-27 14:38:05)

## 2018-10-28 ENCOUNTER — Telehealth: Payer: Self-pay | Admitting: *Deleted

## 2018-10-28 NOTE — Telephone Encounter (Signed)
-----   Message from Teodoro Spray, RN sent at 10/27/2018  4:28 PM EDT ----- Regarding: Dr. Julien Nordmann first time chemo followup Dr. Julien Nordmann patient first time chemo followup. Pt tolerated treatment well.

## 2018-10-28 NOTE — Telephone Encounter (Signed)
TCT patient to follow up with patient after his first chemo. Spoke with patient. He states he feels pretty well, though he states he did not sleep well last night. He states he has been having trouble sleeping even before chemo. He denies n/v/d/fever/chills. He states he is eating well and drinking his fluids well. Reviewed upcoming appts with him. He is getting a portacath placed on 11/01/18 and understands where he is to go, and his other instructions as far as being NPO, having a driver etc. He voices understanding to call [phone] with any questions or concerns.

## 2018-10-29 ENCOUNTER — Other Ambulatory Visit: Payer: Self-pay | Admitting: Physician Assistant

## 2018-10-29 ENCOUNTER — Telehealth: Payer: Self-pay | Admitting: Medical Oncology

## 2018-10-29 NOTE — Telephone Encounter (Signed)
Explained palliative goal of therapy

## 2018-11-01 ENCOUNTER — Encounter (HOSPITAL_COMMUNITY): Payer: Self-pay

## 2018-11-01 ENCOUNTER — Ambulatory Visit (HOSPITAL_COMMUNITY)
Admission: RE | Admit: 2018-11-01 | Discharge: 2018-11-01 | Disposition: A | Discharge status: Home / Self Care | Payer: Medicare Other | Source: Ambulatory Visit | Attending: Internal Medicine | Admitting: Internal Medicine

## 2018-11-01 ENCOUNTER — Telehealth: Payer: Self-pay | Admitting: *Deleted

## 2018-11-01 ENCOUNTER — Other Ambulatory Visit: Payer: Self-pay

## 2018-11-01 ENCOUNTER — Other Ambulatory Visit: Payer: Self-pay | Admitting: Internal Medicine

## 2018-11-01 DIAGNOSIS — Z79890 Hormone replacement therapy (postmenopausal): Secondary | ICD-10-CM | POA: Diagnosis not present

## 2018-11-01 DIAGNOSIS — Z7984 Long term (current) use of oral hypoglycemic drugs: Secondary | ICD-10-CM | POA: Insufficient documentation

## 2018-11-01 DIAGNOSIS — Z7982 Long term (current) use of aspirin: Secondary | ICD-10-CM | POA: Insufficient documentation

## 2018-11-01 DIAGNOSIS — C3491 Malignant neoplasm of unspecified part of right bronchus or lung: Secondary | ICD-10-CM | POA: Insufficient documentation

## 2018-11-01 DIAGNOSIS — I252 Old myocardial infarction: Secondary | ICD-10-CM | POA: Diagnosis not present

## 2018-11-01 DIAGNOSIS — Z79899 Other long term (current) drug therapy: Secondary | ICD-10-CM | POA: Insufficient documentation

## 2018-11-01 DIAGNOSIS — E119 Type 2 diabetes mellitus without complications: Secondary | ICD-10-CM | POA: Insufficient documentation

## 2018-11-01 HISTORY — DX: Dyspnea, unspecified: R06.00

## 2018-11-01 HISTORY — PX: IR IMAGING GUIDED PORT INSERTION: IMG5740

## 2018-11-01 LAB — CBC
HCT: 35.3 % — ABNORMAL LOW (ref 39.0–52.0)
Hemoglobin: 12.3 g/dL — ABNORMAL LOW (ref 13.0–17.0)
MCH: 33.2 pg (ref 26.0–34.0)
MCHC: 34.8 g/dL (ref 30.0–36.0)
MCV: 95.4 fL (ref 80.0–100.0)
Platelets: 129 10*3/uL — ABNORMAL LOW (ref 150–400)
RBC: 3.7 MIL/uL — ABNORMAL LOW (ref 4.22–5.81)
RDW: 14.8 % (ref 11.5–15.5)
WBC: 5.3 10*3/uL (ref 4.0–10.5)
nRBC: 0 % (ref 0.0–0.2)

## 2018-11-01 LAB — GLUCOSE, CAPILLARY: Glucose-Capillary: 102 mg/dL — ABNORMAL HIGH (ref 70–99)

## 2018-11-01 LAB — APTT: aPTT: 31 seconds (ref 24–36)

## 2018-11-01 LAB — PROTIME-INR
INR: 1 (ref 0.8–1.2)
Prothrombin Time: 12.7 seconds (ref 11.4–15.2)

## 2018-11-01 MED ORDER — CEFAZOLIN SODIUM-DEXTROSE 2-4 GM/100ML-% IV SOLN
INTRAVENOUS | Status: AC
  Administered 2018-11-01: 2 g via INTRAVENOUS
  Filled 2018-11-01: qty 100

## 2018-11-01 MED ORDER — CEFAZOLIN SODIUM-DEXTROSE 2-4 GM/100ML-% IV SOLN
2.0000 g | Freq: Once | INTRAVENOUS | Status: AC
  Administered 2018-11-01: 2 g via INTRAVENOUS

## 2018-11-01 MED ORDER — LIDOCAINE-EPINEPHRINE (PF) 1 %-1:200000 IJ SOLN
INTRAMUSCULAR | Status: AC | PRN
  Administered 2018-11-01: 10 mL

## 2018-11-01 MED ORDER — MIDAZOLAM HCL 2 MG/2ML IJ SOLN
INTRAMUSCULAR | Status: AC
  Filled 2018-11-01: qty 4

## 2018-11-01 MED ORDER — MIDAZOLAM HCL 2 MG/2ML IJ SOLN
INTRAMUSCULAR | Status: AC | PRN
  Administered 2018-11-01 (×2): 1 mg via INTRAVENOUS

## 2018-11-01 MED ORDER — HEPARIN SOD (PORK) LOCK FLUSH 100 UNIT/ML IV SOLN
INTRAVENOUS | Status: AC
  Filled 2018-11-01: qty 5

## 2018-11-01 MED ORDER — FENTANYL CITRATE (PF) 100 MCG/2ML IJ SOLN
INTRAMUSCULAR | Status: AC | PRN
  Administered 2018-11-01 (×2): 50 ug via INTRAVENOUS

## 2018-11-01 MED ORDER — LIDOCAINE-EPINEPHRINE (PF) 2 %-1:200000 IJ SOLN
INTRAMUSCULAR | Status: AC
  Filled 2018-11-01: qty 20

## 2018-11-01 MED ORDER — HEPARIN SOD (PORK) LOCK FLUSH 100 UNIT/ML IV SOLN
INTRAVENOUS | Status: AC | PRN
  Administered 2018-11-01: 500 [IU] via INTRAVENOUS

## 2018-11-01 MED ORDER — SODIUM CHLORIDE 0.9 % IV SOLN
INTRAVENOUS | Status: DC
  Administered 2018-11-01: 11:00:00 via INTRAVENOUS

## 2018-11-01 MED ORDER — FENTANYL CITRATE (PF) 100 MCG/2ML IJ SOLN
INTRAMUSCULAR | Status: AC
  Filled 2018-11-01: qty 2

## 2018-11-01 MED ORDER — LIDOCAINE HCL (PF) 1 % IJ SOLN
INTRAMUSCULAR | Status: AC | PRN
  Administered 2018-11-01: 5 mL

## 2018-11-01 NOTE — H&P (Signed)
Referring Physician(s): Mohamed,Mohamed  Supervising Physician: Sandi Mariscal  Patient Status:  [facility] OP  Chief Complaint: "I'm here for a port a cath"   Subjective: Patient familiar to IR service from right eighth rib lesion biopsy on 08/16/2018.  He has a history of stage IV adenocarcinoma of the right lung and presents again today for Port-A-Cath placement for chemotherapy.  He denies fever, headache, worsening dyspnea, cough, nausea, vomiting or bleeding.  He does have occasional right lateral and right lower chest/epigastric discomfort.  Past Medical History:  Diagnosis Date  . Cancer ([place]) 2020   lung  . Diabetes mellitus without complication ([place])   . Dyspnea    increased exertion   Past Surgical History:  Procedure Laterality Date  . CARDIAC SURGERY     Heart attack in 2008 with stent placement  . stent in heart       Allergies: Patient has no known allergies.  Medications: Prior to Admission medications   Medication Sig Start Date End Date Taking? Authorizing Provider  acetaminophen (TYLENOL) 500 MG tablet Take 1,000 mg by mouth every 6 (six) hours as needed for mild pain.    Yes [provider]  amLODipine (NORVASC) 5 MG tablet Take 5 mg by mouth daily. 05/30/18  Yes [provider]  aspirin EC 81 MG tablet Take 81 mg by mouth daily.    Yes [provider]  atorvastatin (LIPITOR) 20 MG tablet Take 20 mg by mouth daily.   Yes [provider]  carvedilol (COREG) 3.125 MG tablet Take 3.125 mg by mouth 2 (two) times daily with a meal.   Yes [provider]  diphenhydrAMINE (BENADRYL) 25 MG tablet Take 1 tablet (25 mg total) by mouth every 6 (six) hours. 08/23/12  Yes Marny Lowenstein, PA-C  famotidine (PEPCID) 20 MG tablet Take 1 tablet (20 mg total) by mouth 2 (two) times daily. 08/23/12  Yes Marny Lowenstein, PA-C  folic acid (FOLVITE) 1 MG tablet Take 1 tablet (1 mg total) by mouth daily. 10/20/18  Yes Curt Bears, MD   gabapentin (NEURONTIN) 400 MG capsule Take 1 capsule by mouth every 8 (eight) hours. 08/17/18  Yes [provider]  ibuprofen (ADVIL,MOTRIN) 200 MG tablet Take 200 mg by mouth every 6 (six) hours as needed for moderate pain.   Yes [provider]  levothyroxine (SYNTHROID, LEVOTHROID) 100 MCG tablet Take 100 mcg by mouth daily. 06/26/18  Yes [provider]  lidocaine (XYLOCAINE) 2 % solution Use as directed 15 mLs in the mouth or throat every 6 (six) hours as needed (mouth/throat pain). Swish and swallow 09/22/18  Yes Gery Pray, MD  metFORMIN (GLUCOPHAGE) 1000 MG tablet Take 500-1,000 mg by mouth See admin instructions. Take 1000mg  in the am and 500mg  in the pm.   Yes [provider]  metoprolol succinate (TOPROL-XL) 100 MG 24 hr tablet Take 100 mg by mouth daily. Take with or immediately following a meal.   Yes [provider]  oxyCODONE-acetaminophen (PERCOCET/ROXICET) 5-325 MG tablet Take 1 tablet by mouth every 4 (four) hours as needed for severe pain. 10/18/18  Yes Gery Pray, MD  pantoprazole (PROTONIX) 40 MG tablet Take 40 mg by mouth 2 (two) times daily. 05/17/18  Yes [provider]  ramipril (ALTACE) 10 MG capsule Take 10 mg by mouth daily. 04/21/18  Yes [provider]  sucralfate (CARAFATE) 1 g tablet Take 1 tablet (1 g total) by mouth 4 (four) times daily -  with meals  and at bedtime. Crush and dissolve in 10 mL of water prior to swallowing 08/30/18  Yes Gery Pray, MD  TOUJEO SOLOSTAR 300 UNIT/ML SOPN Inject 18 Units into the skin every morning. 06/16/18  Yes [provider]  traMADol (ULTRAM) 50 MG tablet Take 1 tablet (50 mg total) by mouth every 6 (six) hours as needed. Patient taking differently: Take 50 mg by mouth every 6 (six) hours as needed for moderate pain.  07/05/18  Yes Lajean Saver, MD  prochlorperazine (COMPAZINE) 10 MG tablet Take 1 tablet (10 mg total) by mouth every 6 (six) hours as needed for  nausea or vomiting. Patient not taking: Reported on 08/30/2018 08/20/18   Curt Bears, MD     Vital Signs: Blood pressure 109/89, temperature 98.8, heart rate 73, respirations 18, O2 sats 99% room air   Physical Exam awake, alert.  Chest clear to auscultation bilaterally.  Heart with regular rate and rhythm.  Abdomen soft, positive bowel sounds, nontender.  No lower extremity edema.    Imaging: No results found.  Labs:  CBC: Recent Labs    09/13/18 0732 10/20/18 0819 10/27/18 1304 11/01/18 1030  WBC 3.2* 4.6 5.7 5.3  HGB 12.7* 12.0* 12.2* 12.3*  HCT 37.7* 35.9* 35.8* 35.3*  PLT 201 184 150 129*    COAGS: Recent Labs    08/16/18 0921 11/01/18 1030  INR 1.0 1.0  APTT 32 31    BMP: Recent Labs    09/06/18 0933 09/13/18 0732 10/20/18 0819 10/27/18 1304  NA 137 138 138 138  K 4.1 3.9 4.2 4.0  CL 105 108 104 107  CO2 21* 20* 25 21*  GLUCOSE 168* 144* 189* 204*  BUN 16 11 12  5*  CALCIUM 8.7* 8.6* 9.0 8.7*  CREATININE 0.88 0.85 1.01 0.90  GFRNONAA >60 >60 >60 >60  GFRAA >60 >60 >60 >60    LIVER FUNCTION TESTS: Recent Labs    09/06/18 0933 09/13/18 0732 10/20/18 0819 10/27/18 1304  BILITOT 1.2 1.0 0.6 0.9  AST 19 22 19 23   ALT 21 35 18 25  ALKPHOS 68 69 70 80  PROT 6.5 6.3* 6.6 6.6  ALBUMIN 3.1* 3.2* 3.2* 3.3*    Assessment and Plan: Pt with history of stage IV adenocarcinoma of the right lung ; presents today for Port-A-Cath placement for chemotherapy.Risks and benefits of image guided port-a-catheter placement was discussed with the patient including, but not limited to bleeding, infection, pneumothorax, or fibrin sheath development and need for additional procedures.  All of the patient's questions were answered, patient is agreeable to proceed. Consent signed and in chart.     Electronically Signed: D. Rowe Robert, PA-C 11/01/2018, 11:34 AM   I spent a total of 25 minutes  at the the patient's bedside AND on the patient's hospital  floor or unit, greater than 50% of which was counseling/coordinating care for  port a cath placement

## 2018-11-01 NOTE — Procedures (Signed)
Pre Procedure Dx: Metastatic lung cancer. Post Procedural Dx: Same  Successful placement of right IJ approach port-a-cath with tip at the superior caval atrial junction. The catheter is ready for immediate use.  Estimated Blood Loss: Minimal  Complications: None immediate.  Ronny Bacon, MD Pager #: [phone]

## 2018-11-01 NOTE — Telephone Encounter (Signed)
Pt daughter called with concerns regarding pt and his understanding of treatment. Pt is under the impression the treatments are a cure. [name] (daughter) asking if this could be further explained to pt at his next office visit on 7/1. Message to [place], [place] and MD for review.

## 2018-11-01 NOTE — Discharge Instructions (Signed)
Implanted [facility] Guide An implanted port is a device that is placed under the skin. It is usually placed in the chest. The device can be used to give IV medicine, to take blood, or for dialysis. You may have an implanted port if:  You need IV medicine that would be irritating to the small veins in your hands or arms.  You need IV medicines, such as antibiotics, for a long period of time.  You need IV nutrition for a long period of time.  You need dialysis. Having a port means that your health care provider will not need to use the veins in your arms for these procedures. You may have fewer limitations when using a port than you would if you used other types of long-term IVs, and you will likely be able to return to normal activities after your incision heals. An implanted port has two main parts:  Reservoir. The reservoir is the part where a needle is inserted to give medicines or draw blood. The reservoir is round. After it is placed, it appears as a small, raised area under your skin.  Catheter. The catheter is a thin, flexible tube that connects the reservoir to a vein. Medicine that is inserted into the reservoir goes into the catheter and then into the vein. How is my port accessed? To access your port:  A numbing cream may be placed on the skin over the port site.  Your health care provider will put on a mask and sterile gloves.  The skin over your port will be cleaned carefully with a germ-killing soap and allowed to dry.  Your health care provider will gently pinch the port and insert a needle into it.  Your health care provider will check for a blood return to make sure the port is in the vein and is not clogged.  If your port needs to remain accessed to get medicine continuously (constant infusion), your health care provider will place a clear bandage (dressing) over the needle site. The dressing and needle will need to be changed every week, or as told by your health care  provider. What is flushing? Flushing helps keep the port from getting clogged. Follow instructions from your health care provider about how and when to flush the port. Ports are usually flushed with saline solution or a medicine called heparin. The need for flushing will depend on how the port is used:  If the port is only used from time to time to give medicines or draw blood, the port may need to be flushed: ? Before and after medicines have been given. ? Before and after blood has been drawn. ? As part of routine maintenance. Flushing may be recommended every 4-6 weeks.  If a constant infusion is running, the port may not need to be flushed.  Throw away any syringes in a disposal container that is meant for sharp items (sharps container). You can buy a sharps container from a pharmacy, or you can make one by using an empty hard plastic bottle with a cover. How long will my port stay implanted? The port can stay in for as long as your health care provider thinks it is needed. When it is time for the port to come out, a surgery will be done to remove it. The surgery will be similar to the procedure that was done to put the port in. Follow these instructions at home:   Flush your port as told by your health care provider.  If you need an infusion over several days, follow instructions from your health care provider about how to take care of your port site. Make sure you: ? Wash your hands with soap and water before you change your dressing. If soap and water are not available, use alcohol-based hand sanitizer. ? Change your dressing as told by your health care provider. ? Place any used dressings or infusion bags into a plastic bag. Throw that bag in the trash. ? Keep the dressing that covers the needle clean and dry. Do not get it wet. ? Do not use scissors or sharp objects near the tube. ? Keep the tube clamped, unless it is being used.  Check your port site every day for signs of  infection. Check for: ? Redness, swelling, or pain. ? Fluid or blood. ? Pus or a bad smell.  Protect the skin around the port site. ? Avoid wearing bra straps that rub or irritate the site. ? Protect the skin around your port from seat belts. Place a soft pad over your chest if needed.  Bathe or shower as told by your health care provider. The site may get wet as long as you are not actively receiving an infusion.  Return to your normal activities as told by your health care provider. Ask your health care provider what activities are safe for you.  Carry a medical alert card or wear a medical alert bracelet at all times. This will let health care providers know that you have an implanted port in case of an emergency. Get help right away if:  You have redness, swelling, or pain at the port site.  You have fluid or blood coming from your port site.  You have pus or a bad smell coming from the port site.  You have a fever. Summary  Implanted ports are usually placed in the chest for long-term IV access.  Follow instructions from your health care provider about flushing the port and changing bandages (dressings).  Take care of the area around your port by avoiding clothing that puts pressure on the area, and by watching for signs of infection.  Protect the skin around your port from seat belts. Place a soft pad over your chest if needed.  Get help right away if you have a fever or you have redness, swelling, pain, drainage, or a bad smell at the port site. This information is not intended to replace advice given to you by your health care provider. Make sure you discuss any questions you have with your health care provider. Document Released: 04/21/2005 Document Revised: 08/13/2018 Document Reviewed: 05/24/2016 Elsevier Patient Education  [place]. Moderate Conscious Sedation, Adult, Care After These instructions provide you with information about caring for yourself after  your procedure. Your health care provider may also give you more specific instructions. Your treatment has been planned according to current medical practices, but problems sometimes occur. Call your health care provider if you have any problems or questions after your procedure. What can I expect after the procedure? After your procedure, it is common:  To feel sleepy for several hours.  To feel clumsy and have poor balance for several hours.  To have poor judgment for several hours.  To vomit if you eat too soon. Follow these instructions at home: For at least 24 hours after the procedure:   Do not: ? Participate in activities where you could fall or become injured. ? Drive. ? Use heavy machinery. ? Drink alcohol. ?  Take sleeping pills or medicines that cause drowsiness. ? Make important decisions or sign legal documents. ? Take care of children on your own.  Rest. Eating and drinking  Follow the diet recommended by your health care provider.  If you vomit: ? Drink water, juice, or soup when you can drink without vomiting. ? Make sure you have little or no nausea before eating solid foods. General instructions  Have a responsible adult stay with you until you are awake and alert.  Take over-the-counter and prescription medicines only as told by your health care provider.  If you smoke, do not smoke without supervision.  Keep all follow-up visits as told by your health care provider. This is important. Contact a health care provider if:  You keep feeling nauseous or you keep vomiting.  You feel light-headed.  You develop a rash.  You have a fever. Get help right away if:  You have trouble breathing. This information is not intended to replace advice given to you by your health care provider. Make sure you discuss any questions you have with your health care provider. Document Released: 02/09/2013 Document Revised: 04/03/2017 Document Reviewed:  08/11/2015 Elsevier Patient Education  2020 [employer].

## 2018-11-03 ENCOUNTER — Encounter: Payer: Self-pay | Admitting: Physician Assistant

## 2018-11-03 ENCOUNTER — Inpatient Hospital Stay: Payer: Medicare Other | Attending: Internal Medicine | Admitting: Physician Assistant

## 2018-11-03 ENCOUNTER — Inpatient Hospital Stay: Payer: Medicare Other

## 2018-11-03 ENCOUNTER — Other Ambulatory Visit: Payer: Self-pay

## 2018-11-03 VITALS — BP 103/71 | HR 72 | Temp 99.1°F | Resp 17 | Ht 69.0 in | Wt 174.5 lb

## 2018-11-03 DIAGNOSIS — R0602 Shortness of breath: Secondary | ICD-10-CM | POA: Diagnosis not present

## 2018-11-03 DIAGNOSIS — Z95828 Presence of other vascular implants and grafts: Secondary | ICD-10-CM

## 2018-11-03 DIAGNOSIS — Z79899 Other long term (current) drug therapy: Secondary | ICD-10-CM | POA: Diagnosis not present

## 2018-11-03 DIAGNOSIS — Z5112 Encounter for antineoplastic immunotherapy: Secondary | ICD-10-CM | POA: Diagnosis present

## 2018-11-03 DIAGNOSIS — Z5189 Encounter for other specified aftercare: Secondary | ICD-10-CM | POA: Insufficient documentation

## 2018-11-03 DIAGNOSIS — C3491 Malignant neoplasm of unspecified part of right bronchus or lung: Secondary | ICD-10-CM

## 2018-11-03 DIAGNOSIS — Z5111 Encounter for antineoplastic chemotherapy: Secondary | ICD-10-CM | POA: Diagnosis present

## 2018-11-03 DIAGNOSIS — G893 Neoplasm related pain (acute) (chronic): Secondary | ICD-10-CM | POA: Diagnosis not present

## 2018-11-03 DIAGNOSIS — C3431 Malignant neoplasm of lower lobe, right bronchus or lung: Secondary | ICD-10-CM | POA: Insufficient documentation

## 2018-11-03 DIAGNOSIS — R05 Cough: Secondary | ICD-10-CM

## 2018-11-03 DIAGNOSIS — C7951 Secondary malignant neoplasm of bone: Secondary | ICD-10-CM | POA: Insufficient documentation

## 2018-11-03 LAB — CMP (CANCER CENTER ONLY)
ALT: 32 U/L (ref 0–44)
AST: 26 U/L (ref 15–41)
Albumin: 3.3 g/dL — ABNORMAL LOW (ref 3.5–5.0)
Alkaline Phosphatase: 86 U/L (ref 38–126)
Anion gap: 10 (ref 5–15)
BUN: 9 mg/dL (ref 8–23)
CO2: 20 mmol/L — ABNORMAL LOW (ref 22–32)
Calcium: 8.8 mg/dL — ABNORMAL LOW (ref 8.9–10.3)
Chloride: 105 mmol/L (ref 98–111)
Creatinine: 0.86 mg/dL (ref 0.61–1.24)
GFR, Est AFR Am: >60 mL/min (ref >60)
GFR, Estimated: >60 mL/min (ref >60)
Glucose, Bld: 127 mg/dL — ABNORMAL HIGH (ref 70–99)
Potassium: 4.3 mmol/L (ref 3.5–5.1)
Sodium: 135 mmol/L (ref 135–145)
Total Bilirubin: 0.9 mg/dL (ref 0.3–1.2)
Total Protein: 6.7 g/dL (ref 6.5–8.1)

## 2018-11-03 LAB — CBC WITH DIFFERENTIAL (CANCER CENTER ONLY)
Abs Immature Granulocytes: 0.01 10*3/uL (ref 0.00–0.07)
Basophils Absolute: 0 10*3/uL (ref 0.0–0.1)
Basophils Relative: 1 %
Eosinophils Absolute: 0.2 10*3/uL (ref 0.0–0.5)
Eosinophils Relative: 11 %
HCT: 35.7 % — ABNORMAL LOW (ref 39.0–52.0)
Hemoglobin: 12.2 g/dL — ABNORMAL LOW (ref 13.0–17.0)
Immature Granulocytes: 1 %
Lymphocytes Relative: 37 %
Lymphs Abs: 0.7 10*3/uL (ref 0.7–4.0)
MCH: 31.8 pg (ref 26.0–34.0)
MCHC: 34.2 g/dL (ref 30.0–36.0)
MCV: 93 fL (ref 80.0–100.0)
Monocytes Absolute: 0.1 10*3/uL (ref 0.1–1.0)
Monocytes Relative: 5 %
Neutro Abs: 0.9 10*3/uL — ABNORMAL LOW (ref 1.7–7.7)
Neutrophils Relative %: 45 %
Platelet Count: 98 10*3/uL — ABNORMAL LOW (ref 150–400)
RBC: 3.84 MIL/uL — ABNORMAL LOW (ref 4.22–5.81)
RDW: 14.5 % (ref 11.5–15.5)
Smear Review: 3
WBC Count: 2 10*3/uL — ABNORMAL LOW (ref 4.0–10.5)
nRBC: 0 % (ref 0.0–0.2)

## 2018-11-03 NOTE — Progress Notes (Signed)
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  Guardant 360 Molecular study: NTRK1 [id number] 0.3% Synonymous Alteration  PRIOR THERAPY: Palliative radiotherapy to the right eighth rib in addition to concurrent chemoradiation with weekly carboplatin for AUC of 2 and paclitaxel 45 mg/M2 to the lung mass and mediastinal lymphadenopathy for the locally advanced disease.  Status post 4 cycle of chemotherapy.  Last cycle was given on 09/13/2018.  CURRENT THERAPY: Systemic chemotherapy with carboplatin for AUC of 5, Alimta 500 mg/M2 and Keytruda 200 mg IV every 3 weeks.  First dose October 27, 2018. Status post 1 cycle.   INTERVAL HISTORY: Zachary Knox 68 y.o. male returns to the clinic for a follow-up visit.  The patient is feeling well today without any concerning complaints.  He is here today for a 1 week follow-up evaluation after completing his first cycle of his new chemotherapy regimen.  He tolerated his treatment well without any adverse side effects.  He denies any fever, chills, night sweats, or weight loss.  He denies any hemoptysis.  The patient experiences intermittent right-sided rib pain secondary to his known metastasis on the right eighth rib.  He has completed palliative radiotherapy to this region and his pain is greatly improved from prior.  He occasionally needs to take 1 Percocet to alleviate his pain but states that he usually only takes 1 pill every 2 to 3 days.  He endorses his baseline shortness of breath and productive cough.  He denies any nausea, vomiting, diarrhea, or constipation.  Denies any headache or visual changes.  He denies any rashes or skin changes.   The patient recently had a Port-A-Cath placed and tolerated the procedure well. He is here today for evaluation following his first cycle of chemotherapy.  MEDICAL HISTORY: Past Medical History:  Diagnosis Date  . Cancer ([place]) 2020   lung  . Diabetes mellitus without complication ([place])   . Dyspnea    increased exertion    ALLERGIES:  has No Known Allergies.  MEDICATIONS:  Current Outpatient Medications  Medication Sig Dispense Refill  . acetaminophen (TYLENOL) 500 MG tablet Take 1,000 mg by mouth every 6 (six) hours as needed for mild pain.     Marland Kitchen amLODipine (NORVASC) 5 MG tablet Take 5 mg by mouth daily.    Marland Kitchen aspirin EC 81 MG tablet Take 81 mg by mouth daily.     Marland Kitchen atorvastatin (LIPITOR) 20 MG tablet Take 20 mg by mouth daily.    . carvedilol (COREG) 3.125 MG tablet Take 3.125 mg by mouth 2 (two) times daily with a meal.    . diphenhydrAMINE (BENADRYL) 25 MG tablet Take 1 tablet (25 mg total) by mouth every 6 (six) hours. 20 tablet 0  . famotidine (PEPCID) 20 MG tablet Take 1 tablet (20 mg total) by mouth 2 (two) times daily. 10 tablet 0  . folic acid (FOLVITE) 1 MG tablet Take 1 tablet (1 mg total) by mouth daily. 30 tablet 4  . gabapentin (NEURONTIN) 400 MG capsule Take 1 capsule by mouth every 8 (eight) hours.    Marland Kitchen ibuprofen (ADVIL,MOTRIN) 200 MG tablet Take 200 mg by mouth every 6 (six) hours as needed for moderate pain.    Marland Kitchen  levothyroxine (SYNTHROID, LEVOTHROID) 100 MCG tablet Take 100 mcg by mouth daily.    Marland Kitchen lidocaine (XYLOCAINE) 2 % solution Use as directed 15 mLs in the mouth or throat every 6 (six) hours as needed (mouth/throat pain). Swish and swallow 100 mL 2  . metFORMIN (GLUCOPHAGE) 1000 MG tablet Take 500-1,000 mg by mouth See admin instructions. Take 1013m in the am and 5050min the pm.    . metoprolol succinate (TOPROL-XL) 100 MG 24 hr tablet Take 100 mg by mouth daily. Take with or immediately following a meal.    . oxyCODONE-acetaminophen (PERCOCET/ROXICET) 5-325 MG  tablet Take 1 tablet by mouth every 4 (four) hours as needed for severe pain. 30 tablet 0  . pantoprazole (PROTONIX) 40 MG tablet Take 40 mg by mouth 2 (two) times daily.    . prochlorperazine (COMPAZINE) 10 MG tablet Take 1 tablet (10 mg total) by mouth every 6 (six) hours as needed for nausea or vomiting. (Patient not taking: Reported on 08/30/2018) 30 tablet 1  . ramipril (ALTACE) 10 MG capsule Take 10 mg by mouth daily.    . sucralfate (CARAFATE) 1 g tablet Take 1 tablet (1 g total) by mouth 4 (four) times daily -  with meals and at bedtime. Crush and dissolve in 10 mL of water prior to swallowing 60 tablet 1  . TOUJEO SOLOSTAR 300 UNIT/ML SOPN Inject 18 Units into the skin every morning.    . traMADol (ULTRAM) 50 MG tablet Take 1 tablet (50 mg total) by mouth every 6 (six) hours as needed. (Patient taking differently: Take 50 mg by mouth every 6 (six) hours as needed for moderate pain. ) 20 tablet 0   No current facility-administered medications for this visit.     SURGICAL HISTORY:  Past Surgical History:  Procedure Laterality Date  . CARDIAC SURGERY     Heart attack in 2008 with stent placement  . IR IMAGING GUIDED PORT INSERTION  11/01/2018  . stent in heart      REVIEW OF SYSTEMS:   Review of Systems  Constitutional: Negative for appetite change, chills, fatigue, fever and unexpected weight change.  HENT:   Negative for mouth sores, nosebleeds, sore throat and trouble swallowing.   Eyes: Negative for eye problems and icterus.  Respiratory: Negative for cough, hemoptysis, shortness of breath and wheezing.   Cardiovascular: Negative for chest pain and leg swelling.  Gastrointestinal: Negative for abdominal pain, constipation, diarrhea, nausea and vomiting.  Genitourinary: Negative for bladder incontinence, difficulty urinating, dysuria, frequency and hematuria.   Musculoskeletal: Negative for back pain, gait problem, neck pain and neck stiffness.  Skin: Negative for itching and  rash.  Neurological: Negative for dizziness, extremity weakness, gait problem, headaches, light-headedness and seizures.  Hematological: Negative for adenopathy. Does not bruise/bleed easily.  Psychiatric/Behavioral: Negative for confusion, depression and sleep disturbance. The patient is not nervous/anxious.     PHYSICAL EXAMINATION:  Blood pressure 103/71, pulse 72, temperature 99.1 F (37.3 C), temperature source Oral, resp. rate 17, height '5\' 9"'  (1.753 m), weight 174 lb 8 oz (79.2 kg), SpO2 99 %.  ECOG PERFORMANCE STATUS: 1 - Symptomatic but completely ambulatory  Physical Exam  Constitutional: Oriented to person, place, and time and well-developed, well-nourished, and in no distress.  HENT:  Head: Normocephalic and atraumatic.  Mouth/Throat: Oropharynx is clear and moist. No oropharyngeal exudate.  Eyes: Conjunctivae are normal. Right eye exhibits no discharge. Left eye exhibits no discharge. No scleral icterus.  Neck: Normal range of motion. Neck supple.  Cardiovascular: Normal rate, regular rhythm, normal heart sounds and intact distal pulses.   Pulmonary/Chest: Effort normal and breath sounds normal. No respiratory distress. No wheezes. No rales.  Abdominal: Soft. Bowel sounds are normal. Exhibits no distension and no mass. There is no tenderness.  Musculoskeletal: Normal range of motion. Exhibits no edema.  Lymphadenopathy:    No cervical adenopathy.  Neurological: Alert and oriented to person, place, and time. Exhibits normal muscle tone. Gait normal. Coordination normal.  Skin: Skin is warm and dry. No rash noted. Not diaphoretic. No erythema. No pallor.  Psychiatric: Mood, memory and judgment normal.  Vitals reviewed.  LABORATORY DATA: Lab Results  Component Value Date   WBC 2.0 (L) 11/03/2018   HGB 12.2 (L) 11/03/2018   HCT 35.7 (L) 11/03/2018   MCV 93.0 11/03/2018   PLT 98 (L) 11/03/2018      Chemistry      Component Value Date/Time   NA 135 11/03/2018 1443    K 4.3 11/03/2018 1443   CL 105 11/03/2018 1443   CO2 20 (L) 11/03/2018 1443   BUN 9 11/03/2018 1443   CREATININE 0.86 11/03/2018 1443      Component Value Date/Time   CALCIUM 8.8 (L) 11/03/2018 1443   ALKPHOS 86 11/03/2018 1443   AST 26 11/03/2018 1443   ALT 32 11/03/2018 1443   BILITOT 0.9 11/03/2018 1443       RADIOGRAPHIC STUDIES:  Ct Chest W Contrast  Result Date: 10/12/2018 CLINICAL DATA:  Restaging lung cancer. Initial diagnosis March 2020. Status post chemotherapy and radiation therapy. EXAM: CT CHEST WITH CONTRAST TECHNIQUE: Multidetector CT imaging of the chest was performed during intravenous contrast administration. CONTRAST:  79m OMNIPAQUE IOHEXOL 300 MG/ML  SOLN COMPARISON:  Chest CT 07/20/2018 and PET-CT 07/20/2018 FINDINGS: Cardiovascular: The heart is normal in size and stable. No pericardial effusion. There is stable tortuosity, ectasia and atherosclerotic calcification involving the thoracic aorta. No focal aneurysm or dissection. Extensive three-vessel coronary artery calcifications are again noted. Mediastinum/Nodes: Stable 9 mm precarinal lymph node on image number 58. This was mildly hypermetabolic on the prior PET-CT. The right hilar node adjacent to the right main pulmonary artery on the prior CT scan is no longer identified. The more inferior posterior node is low-attenuation and measures 10 mm. It previously measured 12 mm. It was hypermetabolic on the prior PET-CT. On the prior CT and PET-CT there was a hypermetabolic 11 mm subcarinal lymph node but this is no longer identified. The esophagus is grossly normal. Lungs/Pleura: Spiculated superior segment right lower lobe lung mass measures 3.3 x 2.4 cm. This measured 3.7 x 2.5 cm on the most recent prior CT scan. Stable advanced bullous emphysema and areas of pulmonary scarring. I do not see any new pulmonary lesions or new pulmonary nodules to suggest pulmonary metastatic disease. Scattered the ground-glass opacities  noted in the right upper lobe and right middle lobe, likely inflammatory but attention on future scans is suggested. Scarring changes in the right lower lobe near the pulmonary lesion, likely radiation related. No pleural effusions. Upper Abdomen: No significant upper abdominal findings. No worrisome hepatic or adrenal gland lesions to suggest metastatic disease. Stable right renal cyst. Musculoskeletal: The right eighth rib lesion demonstrates interval healing changes with progressive sclerosis. Small central lucency remains. Adjacent vague density in the overlying pleura appears to be calcification or callus formation. No new lytic or sclerotic bone lesions are identified. IMPRESSION: 1. Slight interval decrease in size of the right lower lobe lung lesion (3.3  x 2.4 versus 3.7 x 2.5 cm). 2. Improved right hilar and mediastinal adenopathy. No new/progressive findings. 3. Healing changes involving the right eighth rib lesion. No new bone lesions are identified. 4. No findings for pulmonary metastatic disease. 5. Patchy vague ground-glass opacities in the right upper lobe and right middle lobe, likely inflammatory but attention on future scans is suggested. 6. Stable advanced emphysematous changes and pulmonary scarring. 7. Stable advanced aortic and coronary artery calcifications for age. Aortic Atherosclerosis (ICD10-I70.0) and Emphysema (ICD10-J43.9). Electronically Signed   By: Marijo Sanes M.D.   On: 10/12/2018 11:23   Ir Imaging Guided Port Insertion  Result Date: 11/01/2018 INDICATION: History of metastatic lung cancer. In need of durable intravenous access for chemotherapy administration. EXAM: IMPLANTED PORT A CATH PLACEMENT WITH ULTRASOUND AND FLUOROSCOPIC GUIDANCE COMPARISON:  Chest CT-10/12/2018 MEDICATIONS: Ancef 2 gm IV; The antibiotic was administered within an appropriate time interval prior to skin puncture. ANESTHESIA/SEDATION: Moderate (conscious) sedation was employed during this procedure. A  total of Versed 2 mg and Fentanyl 100 mcg was administered intravenously. Moderate Sedation Time: 24 minutes. The patient's level of consciousness and vital signs were monitored continuously by radiology nursing throughout the procedure under my direct supervision. CONTRAST:  None FLUOROSCOPY TIME:  42 seconds (12 mGy) COMPLICATIONS: None immediate. PROCEDURE: The procedure, risks, benefits, and alternatives were explained to the patient. Questions regarding the procedure were encouraged and answered. The patient understands and consents to the procedure. The right neck and chest were prepped with chlorhexidine in a sterile fashion, and a sterile drape was applied covering the operative field. Maximum barrier sterile technique with sterile gowns and gloves were used for the procedure. A timeout was performed prior to the initiation of the procedure. Local anesthesia was provided with 1% lidocaine with epinephrine. After creating a small venotomy incision, a micropuncture kit was utilized to access the internal jugular vein. Real-time ultrasound guidance was utilized for vascular access including the acquisition of a permanent ultrasound image documenting patency of the accessed vessel. The microwire was utilized to measure appropriate catheter length. A subcutaneous port pocket was then created along the upper chest wall utilizing a combination of sharp and blunt dissection. The pocket was irrigated with sterile saline. A single lumen slim power injectable port was chosen for placement. The 8 Fr catheter was tunneled from the port pocket site to the venotomy incision. The port was placed in the pocket. The external catheter was trimmed to appropriate length. At the venotomy, an 8 Fr peel-away sheath was placed over a guidewire under fluoroscopic guidance. The catheter was then placed through the sheath and the sheath was removed. Final catheter positioning was confirmed and documented with a fluoroscopic spot  radiograph. The port was accessed with a Huber needle, aspirated and flushed with heparinized saline. The venotomy site was closed with an interrupted 4-0 Vicryl suture. The port pocket incision was closed with interrupted 2-0 Vicryl suture. Dermabond and Steri-strips were applied to both incisions. Dressings were placed. The patient tolerated the procedure well without immediate post procedural complication. FINDINGS: After catheter placement, the tip lies within the superior cavoatrial junction. The catheter aspirates and flushes normally and is ready for immediate use. IMPRESSION: Successful placement of a right internal jugular approach power injectable Port-A-Cath. The catheter is ready for immediate use. Electronically Signed   By: Sandi Mariscal M.D.   On: 11/01/2018 14:14     ASSESSMENT/PLAN:  This is a very pleasant 68 year old African-American male diagnosed with stage IV (T2b, N2, M1C)  non-small cell lung cancer, adenocarcinoma.  He presented with a right lower lobe lung mass in addition to mediastinal lymphadenopathy and metastatic disease to the right eighth rib.  He was diagnosed in April 2020.  His PDL 1 expression is negative and he has no actionable mutations.  The patient had previously completed a course of concurrent chemoradiation for his locally advanced disease in the chest in addition to palliative radiotherapy to the solitary right eighth rib bone lesion.  He tolerated treatment well except for some mild odynophagia.  He is currently undergoing systemic chemotherapy with carboplatin for an AUC of 5, Alimta 500 mg/m and Keytruda 200 mg IV every 3 weeks.  He is status post his first cycle of treatment and tolerated well without any adverse side effects.  The patient was seen Dr. Julien Nordmann today.  Dr. Julien Nordmann spoke to the patient and his family while on the phone today.  Dr. Julien Nordmann reinforced that treatment is palliative in nature.  The patient expresses understanding.   We will see  the patient back for follow-up visit in 2 weeks for evaluation and repeat blood work before starting cycle #2.  The patient will continue taking his folic acid 1 mg p.o. daily.  The patient's Winston is 0.9 today. I reviewed neutropenic precautions with the patient.   The patient was advised to call immediately if he has any concerning symptoms in the interval. The patient voices understanding of current disease status and treatment options and is in agreement with the current care plan. All questions were answered. The patient knows to call the clinic with any problems, questions or concerns. We can certainly see the patient much sooner if necessary  No orders of the defined types were placed in this encounter.    Rossanna Spitzley L Glendy Barsanti, PA-C 11/03/18\  ADDENDUM: Hematology/Oncology Attending: I I had a face-to-face encounter with the patient today.  I recommended his care plan.  This is a very pleasant 68 years old African-American male with metastatic non-small cell lung cancer, adenocarcinoma with no actionable mutations.  The patient was initially treated with induction concurrent chemoradiation for the locally advanced disease in the chest as well as palliative radiotherapy to the metastatic bone lesion in the right rib.  He is currently undergoing systemic chemotherapy with carboplatin, Alimta and Keytruda status post 1 cycle.  He tolerated the first cycle of his treatment well with no concerning adverse effects.  I discussed with the patient again the role of his systemic chemotherapy and explained to the patient that he has incurable condition and all the treatment are of palliative nature.  He was missing this point when he was discussing his condition with the family. I had a lengthy discussion with the patient and his family who were available by phone about his condition and the role of his treatment.  The patient was reminded of the palliative nature of his treatment.  He would like  to continue his treatment as planned and he is expected to start cycle #2 in 2 weeks. The patient was advised to call immediately if he has any concerning symptoms in the interval.  Disclaimer: This note was dictated with voice recognition software. Similar sounding words can inadvertently be transcribed and may be missed upon review. Eilleen Kempf, MD 11/03/18

## 2018-11-10 ENCOUNTER — Inpatient Hospital Stay: Payer: Medicare Other

## 2018-11-10 ENCOUNTER — Other Ambulatory Visit: Payer: Self-pay

## 2018-11-10 DIAGNOSIS — C3491 Malignant neoplasm of unspecified part of right bronchus or lung: Secondary | ICD-10-CM

## 2018-11-10 DIAGNOSIS — Z5112 Encounter for antineoplastic immunotherapy: Secondary | ICD-10-CM | POA: Diagnosis not present

## 2018-11-10 LAB — CBC WITH DIFFERENTIAL (CANCER CENTER ONLY)
Abs Immature Granulocytes: 0.01 10*3/uL (ref 0.00–0.07)
Basophils Absolute: 0 10*3/uL (ref 0.0–0.1)
Basophils Relative: 1 %
Eosinophils Absolute: 0.7 10*3/uL — ABNORMAL HIGH (ref 0.0–0.5)
Eosinophils Relative: 20 %
HCT: 34.5 % — ABNORMAL LOW (ref 39.0–52.0)
Hemoglobin: 11.8 g/dL — ABNORMAL LOW (ref 13.0–17.0)
Immature Granulocytes: 0 %
Lymphocytes Relative: 25 %
Lymphs Abs: 0.8 10*3/uL (ref 0.7–4.0)
MCH: 32.4 pg (ref 26.0–34.0)
MCHC: 34.2 g/dL (ref 30.0–36.0)
MCV: 94.8 fL (ref 80.0–100.0)
Monocytes Absolute: 0.5 10*3/uL (ref 0.1–1.0)
Monocytes Relative: 15 %
Neutro Abs: 1.3 10*3/uL — ABNORMAL LOW (ref 1.7–7.7)
Neutrophils Relative %: 39 %
Platelet Count: 132 10*3/uL — ABNORMAL LOW (ref 150–400)
RBC: 3.64 MIL/uL — ABNORMAL LOW (ref 4.22–5.81)
RDW: 14.4 % (ref 11.5–15.5)
WBC Count: 3.3 10*3/uL — ABNORMAL LOW (ref 4.0–10.5)
nRBC: 0 % (ref 0.0–0.2)

## 2018-11-10 LAB — CMP (CANCER CENTER ONLY)
ALT: 24 U/L (ref 0–44)
AST: 25 U/L (ref 15–41)
Albumin: 3.1 g/dL — ABNORMAL LOW (ref 3.5–5.0)
Alkaline Phosphatase: 94 U/L (ref 38–126)
Anion gap: 10 (ref 5–15)
BUN: 7 mg/dL — ABNORMAL LOW (ref 8–23)
CO2: 22 mmol/L (ref 22–32)
Calcium: 8.2 mg/dL — ABNORMAL LOW (ref 8.9–10.3)
Chloride: 105 mmol/L (ref 98–111)
Creatinine: 0.91 mg/dL (ref 0.61–1.24)
GFR, Est AFR Am: >60 mL/min (ref >60)
GFR, Estimated: >60 mL/min (ref >60)
Glucose, Bld: 139 mg/dL — ABNORMAL HIGH (ref 70–99)
Potassium: 3.9 mmol/L (ref 3.5–5.1)
Sodium: 137 mmol/L (ref 135–145)
Total Bilirubin: 0.6 mg/dL (ref 0.3–1.2)
Total Protein: 6.5 g/dL (ref 6.5–8.1)

## 2018-11-12 ENCOUNTER — Telehealth: Payer: Self-pay | Admitting: *Deleted

## 2018-11-12 NOTE — Telephone Encounter (Signed)
Received call from pt's wife. She states her husband has c/o headache for the past 3 days with no relief from Tylenol. No other symptoms. Denies fever, chills, visual changes, weakness on on side or the other. No confusion noted. Last brain MRI was in March 2020. Advised to try Ibuprofen as Tylenol has not been working.  Advised to call back if no reliefor other symptoms develop. Pt has appts on 11/17/18

## 2018-11-15 ENCOUNTER — Other Ambulatory Visit: Payer: Medicare Other

## 2018-11-15 ENCOUNTER — Ambulatory Visit: Payer: Medicare Other | Admitting: Internal Medicine

## 2018-11-17 ENCOUNTER — Other Ambulatory Visit: Payer: Self-pay

## 2018-11-17 ENCOUNTER — Inpatient Hospital Stay: Payer: Medicare Other

## 2018-11-17 ENCOUNTER — Encounter: Payer: Self-pay | Admitting: Physician Assistant

## 2018-11-17 ENCOUNTER — Inpatient Hospital Stay (HOSPITAL_BASED_OUTPATIENT_CLINIC_OR_DEPARTMENT_OTHER): Payer: Medicare Other | Admitting: Physician Assistant

## 2018-11-17 VITALS — BP 107/77 | HR 72 | Temp 98.5°F | Resp 18 | Ht 69.0 in | Wt 176.9 lb

## 2018-11-17 DIAGNOSIS — K5900 Constipation, unspecified: Secondary | ICD-10-CM

## 2018-11-17 DIAGNOSIS — C3491 Malignant neoplasm of unspecified part of right bronchus or lung: Secondary | ICD-10-CM

## 2018-11-17 DIAGNOSIS — G893 Neoplasm related pain (acute) (chronic): Secondary | ICD-10-CM | POA: Diagnosis not present

## 2018-11-17 DIAGNOSIS — C7951 Secondary malignant neoplasm of bone: Secondary | ICD-10-CM

## 2018-11-17 DIAGNOSIS — Z5112 Encounter for antineoplastic immunotherapy: Secondary | ICD-10-CM | POA: Diagnosis not present

## 2018-11-17 DIAGNOSIS — Z5111 Encounter for antineoplastic chemotherapy: Secondary | ICD-10-CM

## 2018-11-17 DIAGNOSIS — Z95828 Presence of other vascular implants and grafts: Secondary | ICD-10-CM | POA: Insufficient documentation

## 2018-11-17 DIAGNOSIS — C3431 Malignant neoplasm of lower lobe, right bronchus or lung: Secondary | ICD-10-CM

## 2018-11-17 LAB — CBC WITH DIFFERENTIAL (CANCER CENTER ONLY)
Abs Immature Granulocytes: 0.04 10*3/uL (ref 0.00–0.07)
Basophils Absolute: 0.1 10*3/uL (ref 0.0–0.1)
Basophils Relative: 1 %
Eosinophils Absolute: 0.3 10*3/uL (ref 0.0–0.5)
Eosinophils Relative: 8 %
HCT: 31.2 % — ABNORMAL LOW (ref 39.0–52.0)
Hemoglobin: 10.7 g/dL — ABNORMAL LOW (ref 13.0–17.0)
Immature Granulocytes: 1 %
Lymphocytes Relative: 22 %
Lymphs Abs: 0.8 10*3/uL (ref 0.7–4.0)
MCH: 32 pg (ref 26.0–34.0)
MCHC: 34.3 g/dL (ref 30.0–36.0)
MCV: 93.4 fL (ref 80.0–100.0)
Monocytes Absolute: 0.7 10*3/uL (ref 0.1–1.0)
Monocytes Relative: 19 %
Neutro Abs: 1.8 10*3/uL (ref 1.7–7.7)
Neutrophils Relative %: 49 %
Platelet Count: 155 10*3/uL (ref 150–400)
RBC: 3.34 MIL/uL — ABNORMAL LOW (ref 4.22–5.81)
RDW: 14.3 % (ref 11.5–15.5)
WBC Count: 3.7 10*3/uL — ABNORMAL LOW (ref 4.0–10.5)
nRBC: 0 % (ref 0.0–0.2)

## 2018-11-17 LAB — CMP (CANCER CENTER ONLY)
ALT: 28 U/L (ref 0–44)
AST: 41 U/L (ref 15–41)
Albumin: 2.9 g/dL — ABNORMAL LOW (ref 3.5–5.0)
Alkaline Phosphatase: 104 U/L (ref 38–126)
Anion gap: 10 (ref 5–15)
BUN: 8 mg/dL (ref 8–23)
CO2: 24 mmol/L (ref 22–32)
Calcium: 8.3 mg/dL — ABNORMAL LOW (ref 8.9–10.3)
Chloride: 103 mmol/L (ref 98–111)
Creatinine: 0.77 mg/dL (ref 0.61–1.24)
GFR, Est AFR Am: >60 mL/min (ref >60)
GFR, Estimated: >60 mL/min (ref >60)
Glucose, Bld: 117 mg/dL — ABNORMAL HIGH (ref 70–99)
Potassium: 3.8 mmol/L (ref 3.5–5.1)
Sodium: 137 mmol/L (ref 135–145)
Total Bilirubin: 1.1 mg/dL (ref 0.3–1.2)
Total Protein: 6.3 g/dL — ABNORMAL LOW (ref 6.5–8.1)

## 2018-11-17 LAB — TSH: TSH: 0.622 u[IU]/mL (ref 0.320–4.118)

## 2018-11-17 MED ORDER — SODIUM CHLORIDE 0.9 % IV SOLN
Freq: Once | INTRAVENOUS | Status: AC
  Administered 2018-11-17: 15:00:00 via INTRAVENOUS
  Filled 2018-11-17: qty 5

## 2018-11-17 MED ORDER — SODIUM CHLORIDE 0.9 % IV SOLN
200.0000 mg | Freq: Once | INTRAVENOUS | Status: AC
  Administered 2018-11-17: 15:00:00 200 mg via INTRAVENOUS
  Filled 2018-11-17: qty 8

## 2018-11-17 MED ORDER — PALONOSETRON HCL INJECTION 0.25 MG/5ML
0.2500 mg | Freq: Once | INTRAVENOUS | Status: AC
  Administered 2018-11-17: 14:00:00 0.25 mg via INTRAVENOUS

## 2018-11-17 MED ORDER — PALONOSETRON HCL INJECTION 0.25 MG/5ML
INTRAVENOUS | Status: AC
  Filled 2018-11-17: qty 5

## 2018-11-17 MED ORDER — SODIUM CHLORIDE 0.9% FLUSH
10.0000 mL | INTRAVENOUS | Status: DC | PRN
  Administered 2018-11-17: 10 mL
  Filled 2018-11-17: qty 10

## 2018-11-17 MED ORDER — HEPARIN SOD (PORK) LOCK FLUSH 100 UNIT/ML IV SOLN
500.0000 [IU] | Freq: Once | INTRAVENOUS | Status: AC | PRN
  Administered 2018-11-17: 500 [IU]
  Filled 2018-11-17: qty 5

## 2018-11-17 MED ORDER — SODIUM CHLORIDE 0.9 % IV SOLN
520.0000 mg | Freq: Once | INTRAVENOUS | Status: AC
  Administered 2018-11-17: 520 mg via INTRAVENOUS
  Filled 2018-11-17: qty 52

## 2018-11-17 MED ORDER — SODIUM CHLORIDE 0.9 % IV SOLN
510.0000 mg/m2 | Freq: Once | INTRAVENOUS | Status: AC
  Administered 2018-11-17: 16:00:00 1000 mg via INTRAVENOUS
  Filled 2018-11-17: qty 40

## 2018-11-17 MED ORDER — SODIUM CHLORIDE 0.9 % IV SOLN
Freq: Once | INTRAVENOUS | Status: AC
  Administered 2018-11-17: 14:00:00 via INTRAVENOUS
  Filled 2018-11-17: qty 250

## 2018-11-17 NOTE — Progress Notes (Signed)
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  Guardant 360 Molecular study: NTRK1 [id number] 0.3% Synonymous Alteration  PRIOR THERAPY: Palliative radiotherapy to the right eighth rib in addition to concurrent chemoradiation with weekly carboplatin for AUC of 2 and paclitaxel 45 mg/M2 to the lung mass and mediastinal lymphadenopathy for the locally advanced disease. Status post 4cycle of chemotherapy.Last cycle was given on 09/13/2018.  CURRENT THERAPY: Systemic chemotherapy with carboplatin for AUC of 5, Alimta 500 mg/M2 and Keytruda 200 mg IV every 3 weeks. First dose October 27, 2018. Status post 1 cycle.   INTERVAL HISTORY: Zachary Knox 68 y.o. male returns to the clinic for a follow-up visit.  The patient is feeling well today without any concerning complaints except the patient called triage last night. The patient states that he had people over at his house doing yard work.  The patient was outside for approximately 10 minutes supervising the yard work.  When he came back into the house, he "felt like he walked into a freezer" and "stayed cold for approximately 35 to 40 minutes".  He states he then sat down on the couch and experienced generalized weakness when he tried to get up.  He also reports a temperature reading of 100.1.  EMS was called.  By the time EMS arrived to his house, the patient states that his symptoms have resolved.  The patient is wife continued to monitor his temperature last night and he did not have any more abnormal temperature readings.  The patient states that he frequently experiences generalized weakness; however, it "gets better once he moves  around".  The patient feels well today and has not experienced any more of these symptoms at this time.  Otherwise, the patient has been tolerating his treatment well without any adverse side effects.  The patient denies any night sweats or weight loss.  He denies any shortness of breath, cough, or hemoptysis.  He denies any nausea, vomiting, or diarrhea.  The patient states he occasionally experiences constipation he takes Percocet.  He takes Percocet every 2 to 3 days if needed for his chest pain secondary to his right 8th rib metastatic bone lesion.  He denies any headaches or visual changes.  He denies any rashes or skin changes.  The patient is here today for evaluation before starting cycle #2.  MEDICAL HISTORY: Past Medical History:  Diagnosis Date  . Cancer ([facility]) 2020   lung  . Diabetes mellitus without complication ([facility])   . Dyspnea    increased exertion    ALLERGIES:  has No Known Allergies.  MEDICATIONS:  Current Outpatient Medications  Medication Sig Dispense Refill  . acetaminophen (TYLENOL) 500 MG tablet Take 1,000 mg by mouth every 6 (six) hours as needed for mild pain.     Marland Kitchen amLODipine (NORVASC) 5 MG tablet Take 5 mg by mouth daily.    Marland Kitchen aspirin EC 81 MG tablet Take 81 mg by mouth daily.     Marland Kitchen atorvastatin (LIPITOR) 20 MG tablet Take 20 mg by mouth daily.    . carvedilol (COREG) 3.125 MG tablet Take 3.125 mg by mouth 2 (two) times daily with a meal.    . diphenhydrAMINE (BENADRYL)  25 MG tablet Take 1 tablet (25 mg total) by mouth every 6 (six) hours. 20 tablet 0  . famotidine (PEPCID) 20 MG tablet Take 1 tablet (20 mg total) by mouth 2 (two) times daily. 10 tablet 0  . folic acid (FOLVITE) 1 MG tablet Take 1 tablet (1 mg total) by mouth daily. 30 tablet 4  . gabapentin (NEURONTIN) 400 MG capsule Take 1 capsule by mouth every 8 (eight) hours.    Marland Kitchen ibuprofen (ADVIL,MOTRIN) 200 MG tablet Take 200 mg by mouth every 6 (six) hours as needed for moderate pain.    Marland Kitchen levothyroxine  (SYNTHROID, LEVOTHROID) 100 MCG tablet Take 100 mcg by mouth daily.    Marland Kitchen lidocaine (XYLOCAINE) 2 % solution Use as directed 15 mLs in the mouth or throat every 6 (six) hours as needed (mouth/throat pain). Swish and swallow 100 mL 2  . metFORMIN (GLUCOPHAGE) 1000 MG tablet Take 500-1,000 mg by mouth See admin instructions. Take 1052m in the am and 5035min the pm.    . metoprolol succinate (TOPROL-XL) 100 MG 24 hr tablet Take 100 mg by mouth daily. Take with or immediately following a meal.    . oxyCODONE-acetaminophen (PERCOCET/ROXICET) 5-325 MG tablet Take 1 tablet by mouth every 4 (four) hours as needed for severe pain. 30 tablet 0  . pantoprazole (PROTONIX) 40 MG tablet Take 40 mg by mouth 2 (two) times daily.    . prochlorperazine (COMPAZINE) 10 MG tablet Take 1 tablet (10 mg total) by mouth every 6 (six) hours as needed for nausea or vomiting. (Patient not taking: Reported on 08/30/2018) 30 tablet 1  . ramipril (ALTACE) 10 MG capsule Take 10 mg by mouth daily.    . sucralfate (CARAFATE) 1 g tablet Take 1 tablet (1 g total) by mouth 4 (four) times daily -  with meals and at bedtime. Crush and dissolve in 10 mL of water prior to swallowing 60 tablet 1  . TOUJEO SOLOSTAR 300 UNIT/ML SOPN Inject 18 Units into the skin every morning.    . traMADol (ULTRAM) 50 MG tablet Take 1 tablet (50 mg total) by mouth every 6 (six) hours as needed. (Patient taking differently: Take 50 mg by mouth every 6 (six) hours as needed for moderate pain. ) 20 tablet 0   No current facility-administered medications for this visit.    Facility-Administered Medications Ordered in Other Visits  Medication Dose Route Frequency Provider Last Rate Last Dose  . CARBOplatin (PARAPLATIN) 520 mg in sodium chloride 0.9 % 250 mL chemo infusion  520 mg Intravenous Once MoCurt BearsMD 604 mL/hr at 11/17/18 1617 520 mg at 11/17/18 1617  . heparin lock flush 100 unit/mL  500 Units Intracatheter Once PRN MoCurt BearsMD      .  sodium chloride flush (NS) 0.9 % injection 10 mL  10 mL Intracatheter PRN MoCurt BearsMD        SURGICAL HISTORY:  Past Surgical History:  Procedure Laterality Date  . CARDIAC SURGERY     Heart attack in 2008 with stent placement  . IR IMAGING GUIDED PORT INSERTION  11/01/2018  . stent in heart      REVIEW OF SYSTEMS:   Review of Systems  Constitutional: Positive for occasional generalized weakness which resolves with activity. Negative for appetite change, chills, fatigue, fever and unexpected weight change.  HENT:   Negative for mouth sores, nosebleeds, sore throat and trouble swallowing.   Eyes: Negative for eye problems and icterus.  Respiratory: Negative for  cough, hemoptysis, shortness of breath and wheezing.   Cardiovascular: Negative for chest pain and leg swelling.  Gastrointestinal: Positive for occasional constipation with his pain medications. Negative for abdominal pain, diarrhea, nausea and vomiting.  Genitourinary: Negative for bladder incontinence, difficulty urinating, dysuria, frequency and hematuria.   Musculoskeletal: Negative for back pain, gait problem, neck pain and neck stiffness.  Skin: Negative for itching and rash.  Neurological: Negative for dizziness, extremity weakness, gait problem, headaches, light-headedness and seizures.  Hematological: Negative for adenopathy. Does not bruise/bleed easily.  Psychiatric/Behavioral: Negative for confusion, depression and sleep disturbance. The patient is not nervous/anxious.     PHYSICAL EXAMINATION:  Blood pressure 107/77, pulse 72, temperature 98.5 F (36.9 C), temperature source Oral, resp. rate 18, height '5\' 9"'  (1.753 m), weight 176 lb 14.4 oz (80.2 kg), SpO2 99 %.  ECOG PERFORMANCE STATUS: 1 - Symptomatic but completely ambulatory  Physical Exam  Constitutional: Oriented to person, place, and time and well-developed, well-nourished, and in no distress. HENT:  Head: Normocephalic and atraumatic.   Mouth/Throat: Oropharynx is clear and moist. No oropharyngeal exudate.  Eyes: Conjunctivae are normal. Right eye exhibits no discharge. Left eye exhibits no discharge. No scleral icterus.  Neck: Normal range of motion. Neck supple.  Cardiovascular: Normal rate, regular rhythm, normal heart sounds and intact distal pulses.   Pulmonary/Chest: Effort normal and breath sounds normal. No respiratory distress. No wheezes. No rales.  Abdominal: Soft. Bowel sounds are normal. Exhibits no distension and no mass. There is no tenderness.  Musculoskeletal: Normal range of motion. Exhibits no edema.  Lymphadenopathy:    No cervical adenopathy.  Neurological: Alert and oriented to person, place, and time. Exhibits normal muscle tone. Gait normal. Coordination normal.  Skin: Skin is warm and dry. No rash noted. Not diaphoretic. No erythema. No pallor.  Psychiatric: Mood, memory and judgment normal.  Vitals reviewed.  LABORATORY DATA: Lab Results  Component Value Date   WBC 3.7 (L) 11/17/2018   HGB 10.7 (L) 11/17/2018   HCT 31.2 (L) 11/17/2018   MCV 93.4 11/17/2018   PLT 155 11/17/2018      Chemistry      Component Value Date/Time   NA 137 11/17/2018 1232   K 3.8 11/17/2018 1232   CL 103 11/17/2018 1232   CO2 24 11/17/2018 1232   BUN 8 11/17/2018 1232   CREATININE 0.77 11/17/2018 1232      Component Value Date/Time   CALCIUM 8.3 (L) 11/17/2018 1232   ALKPHOS 104 11/17/2018 1232   AST 41 11/17/2018 1232   ALT 28 11/17/2018 1232   BILITOT 1.1 11/17/2018 1232       RADIOGRAPHIC STUDIES:  Ir Imaging Guided Port Insertion  Result Date: 11/01/2018 INDICATION: History of metastatic lung cancer. In need of durable intravenous access for chemotherapy administration. EXAM: IMPLANTED PORT A CATH PLACEMENT WITH ULTRASOUND AND FLUOROSCOPIC GUIDANCE COMPARISON:  Chest CT-10/12/2018 MEDICATIONS: Ancef 2 gm IV; The antibiotic was administered within an appropriate time interval prior to skin  puncture. ANESTHESIA/SEDATION: Moderate (conscious) sedation was employed during this procedure. A total of Versed 2 mg and Fentanyl 100 mcg was administered intravenously. Moderate Sedation Time: 24 minutes. The patient's level of consciousness and vital signs were monitored continuously by radiology nursing throughout the procedure under my direct supervision. CONTRAST:  None FLUOROSCOPY TIME:  42 seconds (12 mGy) COMPLICATIONS: None immediate. PROCEDURE: The procedure, risks, benefits, and alternatives were explained to the patient. Questions regarding the procedure were encouraged and answered. The patient understands and consents to  the procedure. The right neck and chest were prepped with chlorhexidine in a sterile fashion, and a sterile drape was applied covering the operative field. Maximum barrier sterile technique with sterile gowns and gloves were used for the procedure. A timeout was performed prior to the initiation of the procedure. Local anesthesia was provided with 1% lidocaine with epinephrine. After creating a small venotomy incision, a micropuncture kit was utilized to access the internal jugular vein. Real-time ultrasound guidance was utilized for vascular access including the acquisition of a permanent ultrasound image documenting patency of the accessed vessel. The microwire was utilized to measure appropriate catheter length. A subcutaneous port pocket was then created along the upper chest wall utilizing a combination of sharp and blunt dissection. The pocket was irrigated with sterile saline. A single lumen slim power injectable port was chosen for placement. The 8 Fr catheter was tunneled from the port pocket site to the venotomy incision. The port was placed in the pocket. The external catheter was trimmed to appropriate length. At the venotomy, an 8 Fr peel-away sheath was placed over a guidewire under fluoroscopic guidance. The catheter was then placed through the sheath and the sheath  was removed. Final catheter positioning was confirmed and documented with a fluoroscopic spot radiograph. The port was accessed with a Huber needle, aspirated and flushed with heparinized saline. The venotomy site was closed with an interrupted 4-0 Vicryl suture. The port pocket incision was closed with interrupted 2-0 Vicryl suture. Dermabond and Steri-strips were applied to both incisions. Dressings were placed. The patient tolerated the procedure well without immediate post procedural complication. FINDINGS: After catheter placement, the tip lies within the superior cavoatrial junction. The catheter aspirates and flushes normally and is ready for immediate use. IMPRESSION: Successful placement of a right internal jugular approach power injectable Port-A-Cath. The catheter is ready for immediate use. Electronically Signed   By: Sandi Mariscal M.D.   On: 11/01/2018 14:14     ASSESSMENT/PLAN:  This is a very pleasant 68 year old African-American male diagnosed with stage IV (T2b, N2, M1C) non-small cell lung cancer, adenocarcinoma.  He presented with a right lower lobe lung mass in addition to mediastinal lymphadenopathy and metastatic disease to the right eighth rib.  He was diagnosed in April 2020.  His PDL 1 expression is negative and he has no actionable mutations.  The patient had previously completed a course of concurrent chemoradiation for his locally advanced disease in the chest in addition to palliative radiotherapy to the solitary right eighth rib bone lesion.  He tolerated treatment well except for some mild odynophagia.  He is currently undergoing systemic chemotherapy with carboplatin for an AUC of 5, Alimta 500 mg/m, and Keytruda 200 mg IV every 3 weeks.  He is status post his first cycle of treatment and tolerated well without any adverse side effects.  The patient seen Dr. Julien Nordmann today.  Labs were reviewed with the patient.  We recommend that he proceed with cycle 2 today scheduled.  We  will see him back for follow-up visit in 3 weeks for evaluation before starting cycle #3.  He will continue taking his folic acid 1 mg p.o. daily.  Regarding the patient's constipation with his pain medications, the patient was advised that he may use stool softeners as well as increase his fluid intake and activity.  The patient will continue taking Percocet every 6 hours as needed for his rib/chest pain secondary to his metastatic bone lesion on his eighth rib.  The patient was  advised to call immediately if he has any concerning symptoms in the interval. The patient voices understanding of current disease status and treatment options and is in agreement with the current care plan. All questions were answered. The patient knows to call the clinic with any problems, questions or concerns. We can certainly see the patient much sooner if necessary   No orders of the defined types were placed in this encounter.    Tahjae Durr L Tlaloc Taddei, PA-C 11/17/18  ADDENDUM: Hematology/Oncology Attending: I had a face-to-face encounter with the patient today.  I recommended his care plan.  This is a very pleasant 68 years old African-American male with metastatic non-small cell lung cancer, adenocarcinoma. The patient is currently undergoing systemic chemotherapy with carboplatin, Alimta and Keytruda status post 1 cycle. He tolerated the first cycle of his treatment well with no concerning adverse effects. I recommended for the patient to continue his current treatment and he will proceed with cycle #2 today. I will see him back for follow-up visit in 3 weeks for evaluation before starting cycle #3. He was advised to call immediately if he has any concerning symptoms in the interval.  Disclaimer: This note was dictated with voice recognition software. Similar sounding words can inadvertently be transcribed and may be missed upon review. Eilleen Kempf, MD 11/17/18

## 2018-11-17 NOTE — Patient Instructions (Signed)
[facility] Discharge Instructions for Patients Receiving Chemotherapy  Today you received the following chemotherapy agents Keytruda, Alimta, and Carboplatin  To help prevent nausea and vomiting after your treatment, we encourage you to take your nausea medication as directed.  If you develop nausea and vomiting that is not controlled by your nausea medication, call the clinic.   BELOW ARE SYMPTOMS THAT SHOULD BE REPORTED IMMEDIATELY:  *FEVER GREATER THAN 100.5 F  *CHILLS WITH OR WITHOUT FEVER  NAUSEA AND VOMITING THAT IS NOT CONTROLLED WITH YOUR NAUSEA MEDICATION  *UNUSUAL SHORTNESS OF BREATH  *UNUSUAL BRUISING OR BLEEDING  TENDERNESS IN MOUTH AND THROAT WITH OR WITHOUT PRESENCE OF ULCERS  *URINARY PROBLEMS  *BOWEL PROBLEMS  UNUSUAL RASH Items with * indicate a potential emergency and should be followed up as soon as possible.  Feel free to call the clinic should you have any questions or concerns. The clinic phone number is [phone].  Please show the CHEMO ALERT CARD at check-in to the Emergency Department and triage nurse.   

## 2018-11-18 ENCOUNTER — Telehealth: Payer: Self-pay | Admitting: Internal Medicine

## 2018-11-18 NOTE — Telephone Encounter (Signed)
Scheduled appt per 7/15 los -pt to get an updated schedule next visit.

## 2018-11-24 ENCOUNTER — Inpatient Hospital Stay: Payer: Medicare Other

## 2018-11-24 ENCOUNTER — Other Ambulatory Visit: Payer: Self-pay | Admitting: Physician Assistant

## 2018-11-24 ENCOUNTER — Telehealth: Payer: Self-pay | Admitting: *Deleted

## 2018-11-24 ENCOUNTER — Other Ambulatory Visit: Payer: Self-pay

## 2018-11-24 VITALS — BP 103/73 | Temp 98.2°F | Resp 18

## 2018-11-24 DIAGNOSIS — Z95828 Presence of other vascular implants and grafts: Secondary | ICD-10-CM

## 2018-11-24 DIAGNOSIS — D702 Other drug-induced agranulocytosis: Secondary | ICD-10-CM

## 2018-11-24 DIAGNOSIS — Z5112 Encounter for antineoplastic immunotherapy: Secondary | ICD-10-CM | POA: Diagnosis not present

## 2018-11-24 DIAGNOSIS — C3491 Malignant neoplasm of unspecified part of right bronchus or lung: Secondary | ICD-10-CM

## 2018-11-24 LAB — CBC WITH DIFFERENTIAL (CANCER CENTER ONLY)
Abs Immature Granulocytes: 0 10*3/uL (ref 0.00–0.07)
Basophils Absolute: 0 10*3/uL (ref 0.0–0.1)
Basophils Relative: 3 %
Eosinophils Absolute: 0.1 10*3/uL (ref 0.0–0.5)
Eosinophils Relative: 21 %
HCT: 32.8 % — ABNORMAL LOW (ref 39.0–52.0)
Hemoglobin: 11.1 g/dL — ABNORMAL LOW (ref 13.0–17.0)
Immature Granulocytes: 0 %
Lymphocytes Relative: 60 %
Lymphs Abs: 0.4 10*3/uL — ABNORMAL LOW (ref 0.7–4.0)
MCH: 31.4 pg (ref 26.0–34.0)
MCHC: 33.8 g/dL (ref 30.0–36.0)
MCV: 92.9 fL (ref 80.0–100.0)
Monocytes Absolute: 0 10*3/uL — ABNORMAL LOW (ref 0.1–1.0)
Monocytes Relative: 3 %
Neutro Abs: 0.1 10*3/uL — CL (ref 1.7–7.7)
Neutrophils Relative %: 13 %
Platelet Count: 64 10*3/uL — ABNORMAL LOW (ref 150–400)
RBC: 3.53 MIL/uL — ABNORMAL LOW (ref 4.22–5.81)
RDW: 13.8 % (ref 11.5–15.5)
WBC Count: 0.6 10*3/uL — CL (ref 4.0–10.5)
nRBC: 0 % (ref 0.0–0.2)

## 2018-11-24 LAB — CMP (CANCER CENTER ONLY)
ALT: 75 U/L — ABNORMAL HIGH (ref 0–44)
AST: 54 U/L — ABNORMAL HIGH (ref 15–41)
Albumin: 3 g/dL — ABNORMAL LOW (ref 3.5–5.0)
Alkaline Phosphatase: 165 U/L — ABNORMAL HIGH (ref 38–126)
Anion gap: 12 (ref 5–15)
BUN: 14 mg/dL (ref 8–23)
CO2: 21 mmol/L — ABNORMAL LOW (ref 22–32)
Calcium: 9 mg/dL (ref 8.9–10.3)
Chloride: 102 mmol/L (ref 98–111)
Creatinine: 0.99 mg/dL (ref 0.61–1.24)
GFR, Est AFR Am: >60 mL/min (ref >60)
GFR, Estimated: >60 mL/min (ref >60)
Glucose, Bld: 138 mg/dL — ABNORMAL HIGH (ref 70–99)
Potassium: 4.5 mmol/L (ref 3.5–5.1)
Sodium: 135 mmol/L (ref 135–145)
Total Bilirubin: 1.1 mg/dL (ref 0.3–1.2)
Total Protein: 6.8 g/dL (ref 6.5–8.1)

## 2018-11-24 MED ORDER — FILGRASTIM-SNDZ 480 MCG/0.8ML IJ SOSY
480.0000 ug | PREFILLED_SYRINGE | Freq: Once | INTRAMUSCULAR | Status: DC
  Filled 2018-11-24: qty 0.8

## 2018-11-24 MED ORDER — TBO-FILGRASTIM 480 MCG/0.8ML ~~LOC~~ SOSY: 480.0000 ug | PREFILLED_SYRINGE | Freq: Once | SUBCUTANEOUS | Status: DC

## 2018-11-24 MED ORDER — TBO-FILGRASTIM 480 MCG/0.8ML ~~LOC~~ SOSY
PREFILLED_SYRINGE | SUBCUTANEOUS | Status: AC
  Filled 2018-11-24: qty 0.8

## 2018-11-24 MED ORDER — FILGRASTIM-SNDZ 480 MCG/0.8ML IJ SOSY
480.0000 ug | PREFILLED_SYRINGE | Freq: Once | INTRAMUSCULAR | Status: AC
  Administered 2018-11-24: 16:00:00 480 ug via SUBCUTANEOUS
  Filled 2018-11-24: qty 0.8

## 2018-11-24 NOTE — Patient Instructions (Addendum)

## 2018-11-24 NOTE — Telephone Encounter (Signed)
Received call report from [facility].  "Today's ANC = 0.1."  Provider called with results.

## 2018-11-25 ENCOUNTER — Inpatient Hospital Stay: Payer: Medicare Other

## 2018-11-25 ENCOUNTER — Other Ambulatory Visit: Payer: Self-pay

## 2018-11-25 VITALS — BP 115/82 | HR 87 | Temp 98.9°F | Resp 18

## 2018-11-25 DIAGNOSIS — Z5112 Encounter for antineoplastic immunotherapy: Secondary | ICD-10-CM | POA: Diagnosis not present

## 2018-11-25 DIAGNOSIS — Z95828 Presence of other vascular implants and grafts: Secondary | ICD-10-CM

## 2018-11-25 MED ORDER — FILGRASTIM-SNDZ 480 MCG/0.8ML IJ SOSY
480.0000 ug | PREFILLED_SYRINGE | Freq: Once | INTRAMUSCULAR | Status: AC
  Administered 2018-11-25: 480 ug via SUBCUTANEOUS
  Filled 2018-11-25: qty 0.8

## 2018-11-25 NOTE — Patient Instructions (Signed)

## 2018-11-27 ENCOUNTER — Emergency Department (HOSPITAL_COMMUNITY): Payer: Medicare Other

## 2018-11-27 ENCOUNTER — Encounter (HOSPITAL_COMMUNITY): Payer: Self-pay

## 2018-11-27 ENCOUNTER — Other Ambulatory Visit: Payer: Self-pay

## 2018-11-27 ENCOUNTER — Emergency Department (HOSPITAL_COMMUNITY)
Admission: EM | Admit: 2018-11-27 | Discharge: 2018-11-27 | Disposition: A | Discharge status: Home / Self Care | Payer: Medicare Other | Attending: Emergency Medicine | Admitting: Emergency Medicine

## 2018-11-27 DIAGNOSIS — D696 Thrombocytopenia, unspecified: Secondary | ICD-10-CM | POA: Diagnosis not present

## 2018-11-27 DIAGNOSIS — Z85118 Personal history of other malignant neoplasm of bronchus and lung: Secondary | ICD-10-CM | POA: Insufficient documentation

## 2018-11-27 DIAGNOSIS — E119 Type 2 diabetes mellitus without complications: Secondary | ICD-10-CM | POA: Insufficient documentation

## 2018-11-27 DIAGNOSIS — Z7982 Long term (current) use of aspirin: Secondary | ICD-10-CM | POA: Insufficient documentation

## 2018-11-27 DIAGNOSIS — Z79899 Other long term (current) drug therapy: Secondary | ICD-10-CM | POA: Insufficient documentation

## 2018-11-27 DIAGNOSIS — R0789 Other chest pain: Secondary | ICD-10-CM | POA: Insufficient documentation

## 2018-11-27 DIAGNOSIS — Z7984 Long term (current) use of oral hypoglycemic drugs: Secondary | ICD-10-CM | POA: Insufficient documentation

## 2018-11-27 DIAGNOSIS — Z87891 Personal history of nicotine dependence: Secondary | ICD-10-CM | POA: Diagnosis not present

## 2018-11-27 DIAGNOSIS — R079 Chest pain, unspecified: Secondary | ICD-10-CM

## 2018-11-27 LAB — CBC WITH DIFFERENTIAL/PLATELET
Abs Immature Granulocytes: 0.01 10*3/uL (ref 0.00–0.07)
Basophils Absolute: 0 10*3/uL (ref 0.0–0.1)
Basophils Relative: 0 %
Eosinophils Absolute: 0 10*3/uL (ref 0.0–0.5)
Eosinophils Relative: 0 %
HCT: 29.5 % — ABNORMAL LOW (ref 39.0–52.0)
Hemoglobin: 10.1 g/dL — ABNORMAL LOW (ref 13.0–17.0)
Immature Granulocytes: 1 %
Lymphocytes Relative: 28 %
Lymphs Abs: 0.2 10*3/uL — ABNORMAL LOW (ref 0.7–4.0)
MCH: 32.1 pg (ref 26.0–34.0)
MCHC: 34.2 g/dL (ref 30.0–36.0)
MCV: 93.7 fL (ref 80.0–100.0)
Monocytes Absolute: 0.5 10*3/uL (ref 0.1–1.0)
Monocytes Relative: 52 %
Neutro Abs: 0.2 10*3/uL — ABNORMAL LOW (ref 1.7–7.7)
Neutrophils Relative %: 19 %
Platelets: 10 10*3/uL — CL (ref 150–400)
RBC: 3.15 MIL/uL — ABNORMAL LOW (ref 4.22–5.81)
RDW: 12.9 % (ref 11.5–15.5)
WBC: 0.9 10*3/uL — CL (ref 4.0–10.5)
nRBC: 0 % (ref 0.0–0.2)

## 2018-11-27 LAB — LIPASE, BLOOD: Lipase: 20 U/L (ref 11–51)

## 2018-11-27 LAB — COMPREHENSIVE METABOLIC PANEL
ALT: 36 U/L (ref 0–44)
AST: 23 U/L (ref 15–41)
Albumin: 2.5 g/dL — ABNORMAL LOW (ref 3.5–5.0)
Alkaline Phosphatase: 132 U/L — ABNORMAL HIGH (ref 38–126)
Anion gap: 11 (ref 5–15)
BUN: 15 mg/dL (ref 8–23)
CO2: 21 mmol/L — ABNORMAL LOW (ref 22–32)
Calcium: 8.2 mg/dL — ABNORMAL LOW (ref 8.9–10.3)
Chloride: 101 mmol/L (ref 98–111)
Creatinine, Ser: 1.12 mg/dL (ref 0.61–1.24)
GFR calc Af Amer: >60 mL/min (ref >60)
GFR calc non Af Amer: >60 mL/min (ref >60)
Glucose, Bld: 141 mg/dL — ABNORMAL HIGH (ref 70–99)
Potassium: 3.9 mmol/L (ref 3.5–5.1)
Sodium: 133 mmol/L — ABNORMAL LOW (ref 135–145)
Total Bilirubin: 1.4 mg/dL — ABNORMAL HIGH (ref 0.3–1.2)
Total Protein: 6.2 g/dL — ABNORMAL LOW (ref 6.5–8.1)

## 2018-11-27 LAB — TROPONIN I (HIGH SENSITIVITY)
Troponin I (High Sensitivity): 7 ng/L (ref <18)
Troponin I (High Sensitivity): 7 ng/L (ref <18)

## 2018-11-27 MED ORDER — SUCRALFATE 1 GM/10ML PO SUSP: 1.0000 g | Freq: Three times a day (TID) | ORAL | 0 refills | Status: DC | PRN

## 2018-11-27 MED ORDER — LIDOCAINE VISCOUS HCL 2 % MT SOLN
15.0000 mL | Freq: Once | OROMUCOSAL | Status: AC
  Administered 2018-11-27: 16:00:00 15 mL via ORAL
  Filled 2018-11-27: qty 15

## 2018-11-27 MED ORDER — SUCRALFATE 1 GM/10ML PO SUSP: 1.0000 g | Freq: Three times a day (TID) | ORAL | 0 refills | Status: AC | PRN

## 2018-11-27 MED ORDER — ALUM & MAG HYDROXIDE-SIMETH 200-200-20 MG/5ML PO SUSP
30.0000 mL | Freq: Once | ORAL | Status: AC
  Administered 2018-11-27: 30 mL via ORAL
  Filled 2018-11-27: qty 30

## 2018-11-27 NOTE — ED Notes (Signed)
Pt reports that the GI cocktail alleviated his symptoms for awhile. Symptoms are coming back at this time.

## 2018-11-27 NOTE — Discharge Instructions (Signed)
Get rechecked immediately if you develop any bleeding, fevers, or new concerning symptoms.

## 2018-11-27 NOTE — ED Notes (Signed)
Pt verbalized understanding of discharge paperwork and prescriptions.

## 2018-11-27 NOTE — ED Provider Notes (Signed)
[facility] EMERGENCY DEPARTMENT Provider Note   CSN: [id number] Arrival date & time: 11/27/18  1431    History   Chief Complaint Chief Complaint  Patient presents with  . Chest Pain    HPI Zachary Knox is a 68 y.o. male.     The history is provided by the patient and medical records. No language interpreter was used.  Chest Pain  Zachary Knox is a 68 y.o. male who presents to the Emergency Department complaining of chest pain. He presents to the emergency department complaining of central chest pain that began this morning after eating breakfast. Pain lasted for a couple of hours and resolved after he took an oxycodone. Pain was worse with swallowing cold liquids. He did not feel like anything got stuck in his throat. He had no nausea, vomiting. He does have fatigue and dyspnea on exertion. He is currently undergoing treatment for metastatic lung cancer. He also has a history of hypertension, diabetes. He sees Dr. Michail Sermon with G.I. Symptoms are moderate, waxing and waning, improving. Past Medical History:  Diagnosis Date  . Cancer ([place]) 2020   lung  . Diabetes mellitus without complication ([place])   . Dyspnea    increased exertion    Patient Active Problem List   Diagnosis Date Noted  . Port-A-Cath in place 11/17/2018  . Encounter for antineoplastic immunotherapy 10/20/2018  . Adenocarcinoma of right lung, stage 4 ([place]) 08/19/2018  . Encounter for antineoplastic chemotherapy 08/19/2018  . Goals of care, counseling/discussion 08/19/2018  . Osseous metastasis ([place]) 07/26/2018  . 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 Tobacco abuse disorder 07/12/2018  . Alcohol abuse 07/12/2018    Past Surgical History:  Procedure Laterality Date  . CARDIAC SURGERY     Heart attack in 2008 with stent placement  . IR IMAGING GUIDED PORT INSERTION  11/01/2018  . stent in heart          Home Medications    Prior to Admission medications   Medication Sig  Start Date End Date Taking? Authorizing Provider  acetaminophen (TYLENOL) 500 MG tablet Take 1,000 mg by mouth every 6 (six) hours as needed for mild pain.     [provider]  amLODipine (NORVASC) 5 MG tablet Take 5 mg by mouth daily. 05/30/18   [provider]  aspirin EC 81 MG tablet Take 81 mg by mouth daily.     [provider]  atorvastatin (LIPITOR) 20 MG tablet Take 20 mg by mouth daily.    [provider]  carvedilol (COREG) 3.125 MG tablet Take 3.125 mg by mouth 2 (two) times daily with a meal.    [provider]  diphenhydrAMINE (BENADRYL) 25 MG tablet Take 1 tablet (25 mg total) by mouth every 6 (six) hours. 08/23/12   Marny Lowenstein, PA-C  famotidine (PEPCID) 20 MG tablet Take 1 tablet (20 mg total) by mouth 2 (two) times daily. 08/23/12   Marny Lowenstein, PA-C  folic acid (FOLVITE) 1 MG tablet Take 1 tablet (1 mg total) by mouth daily. 10/20/18   Curt Bears, MD  gabapentin (NEURONTIN) 400 MG capsule Take 1 capsule by mouth every 8 (eight) hours. 08/17/18   [provider]  ibuprofen (ADVIL,MOTRIN) 200 MG tablet Take 200 mg by mouth every 6 (six) hours as needed for moderate pain.    [provider]  levothyroxine (SYNTHROID, LEVOTHROID) 100 MCG tablet Take 100 mcg by mouth daily. 06/26/18   [provider]  lidocaine (  XYLOCAINE) 2 % solution Use as directed 15 mLs in the mouth or throat every 6 (six) hours as needed (mouth/throat pain). Swish and swallow 09/22/18   Gery Pray, MD  metFORMIN (GLUCOPHAGE) 1000 MG tablet Take 500-1,000 mg by mouth See admin instructions. Take 1000mg  in the am and 500mg  in the pm.    [provider]  metoprolol succinate (TOPROL-XL) 100 MG 24 hr tablet Take 100 mg by mouth daily. Take with or immediately following a meal.    [provider]  oxyCODONE-acetaminophen (PERCOCET/ROXICET) 5-325 MG tablet Take 1 tablet by mouth every 4 (four) hours as needed for severe  pain. 10/18/18   Gery Pray, MD  pantoprazole (PROTONIX) 40 MG tablet Take 40 mg by mouth 2 (two) times daily. 05/17/18   [provider]  prochlorperazine (COMPAZINE) 10 MG tablet Take 1 tablet (10 mg total) by mouth every 6 (six) hours as needed for nausea or vomiting. Patient not taking: Reported on 08/30/2018 08/20/18   Curt Bears, MD  ramipril (ALTACE) 10 MG capsule Take 10 mg by mouth daily. 04/21/18   [provider]  sucralfate (CARAFATE) 1 GM/10ML suspension Take 10 mLs (1 g total) by mouth 3 (three) times daily as needed. 11/27/18   Quintella Reichert, MD  TOUJEO SOLOSTAR 300 UNIT/ML SOPN Inject 18 Units into the skin every morning. 06/16/18   [provider]  traMADol (ULTRAM) 50 MG tablet Take 1 tablet (50 mg total) by mouth every 6 (six) hours as needed. Patient taking differently: Take 50 mg by mouth every 6 (six) hours as needed for moderate pain.  07/05/18   Lajean Saver, MD    Family History No family history on file.  Social History Social History   Tobacco Use  . Smoking status: Former Research scientist (life sciences)  . Smokeless tobacco: Never Used  Substance Use Topics  . Alcohol use: Yes    Comment: occassional   . Drug use: Never     Allergies   Patient has no known allergies.   Review of Systems Review of Systems  Cardiovascular: Positive for chest pain.  All other systems reviewed and are negative.    Physical Exam Updated Vital Signs BP 131/86   Pulse 81   Temp 98.7 F (37.1 C)   Resp 18   Ht 5\' 9"  (1.753 m)   Wt 80.3 kg   SpO2 98%   BMI 26.14 kg/m   Physical Exam Vitals signs and nursing note reviewed.  Constitutional:      Appearance: He is well-developed.  HENT:     Head: Normocephalic and atraumatic.  Cardiovascular:     Rate and Rhythm: Normal rate and regular rhythm.     Heart sounds: No murmur.  Pulmonary:     Effort: Pulmonary effort is normal. No respiratory distress.     Breath sounds: Normal breath sounds.   Abdominal:     Palpations: Abdomen is soft.     Tenderness: There is no abdominal tenderness. There is no guarding or rebound.  Musculoskeletal:        General: No swelling or tenderness.  Skin:    General: Skin is warm and dry.  Neurological:     Mental Status: He is alert and oriented to person, place, and time.  Psychiatric:        Mood and Affect: Mood normal.        Behavior: Behavior normal.      ED Treatments / Results  Labs (all labs ordered are listed, but only  abnormal results are displayed) Labs Reviewed  COMPREHENSIVE METABOLIC PANEL - Abnormal; Notable for the following components:      Result Value   Sodium 133 (*)    CO2 21 (*)    Glucose, Bld 141 (*)    Calcium 8.2 (*)    Total Protein 6.2 (*)    Albumin 2.5 (*)    Alkaline Phosphatase 132 (*)    Total Bilirubin 1.4 (*)    All other components within normal limits  CBC WITH DIFFERENTIAL/PLATELET - Abnormal; Notable for the following components:   WBC 0.9 (*)    RBC 3.15 (*)    Hemoglobin 10.1 (*)    HCT 29.5 (*)    Platelets 10 (*)    Neutro Abs 0.2 (*)    Lymphs Abs 0.2 (*)    All other components within normal limits  LIPASE, BLOOD  PATHOLOGIST SMEAR REVIEW  TROPONIN I (HIGH SENSITIVITY)  TROPONIN I (HIGH SENSITIVITY)    EKG None  Radiology Dg Chest 2 View  Result Date: 11/27/2018 CLINICAL DATA:  Chest pain. History of lung cancer. EXAM: CHEST - 2 VIEW COMPARISON:  Chest x-rays dated 08/16/2018 and 08/10/2018 and chest CT dated 10/12/2018 FINDINGS: Power port in place with the tip at the cavoatrial junction. Heart size and vascularity are normal. There is increased density in the posterior aspect of the right lower lobe in the area of the patient's known tumor. There is increased thickening along the fissures in the midzone at the right base laterally. The left lung is clear. No effusions. No bone abnormality. IMPRESSION: 1. Slight density in the posterior aspect of the right lower lobe in the  area of the patient's known tumor. 2. Increased thickening of the fissures on the right. Electronically Signed   By: Lorriane Shire M.D.   On: 11/27/2018 15:49    Procedures Procedures (including critical care time)  Medications Ordered in ED Medications  alum & mag hydroxide-simeth (MAALOX/MYLANTA) 200-200-20 MG/5ML suspension 30 mL (30 mLs Oral Given 11/27/18 1604)    And  lidocaine (XYLOCAINE) 2 % viscous mouth solution 15 mL (15 mLs Oral Given 11/27/18 1604)     Initial Impression / Assessment and Plan / ED Course  I have reviewed the triage vital signs and the nursing notes.  Pertinent labs & imaging results that were available during my care of the patient were reviewed by me and considered in my medical decision making (see chart for details).        Patient with stage IV non-small cell lung cancer here for evaluation of chest pain that began after eating. EKG without acute ischemic changes. Troponin is negative. Pain is resolved after G.I. cocktail. Labs are significant for neutropenia, thrombocytopenia. Chest x-ray demonstrates known right lung mass. No evidence of pneumonia. Discussed findings of studies regarding CBC abnormalities with Dr. Marin Olp with oncology. Plan to have the patient get repeat labs on Monday. He is currently without any evidence of bleeding. Discussed with patient home care for chest pain, likely G.I. and origin. Discussed importance of obtaining repeat labs as well as close return precautions. Will start Carafate and recommend continuing his PPI BID. Also discussed labs and plan with patient's wife. Presentation is not consistent with coronavirus infection, PE, MI, ACS, dissection.   Final Clinical Impressions(s) / ED Diagnoses   Final diagnoses:  Nonspecific chest pain  Thrombocytopenia [facility])    ED Discharge Orders         Ordered    sucralfate (CARAFATE) 1  GM/10ML suspension  3 times daily PRN     11/27/18 1756           Quintella Reichert, MD  11/27/18 1805

## 2018-11-27 NOTE — ED Triage Notes (Signed)
Pt from home via ems. Pt was trying to eat then got substernal chest pain that was burningx2 hrs, non-radiating. Tried to burp, but didn't work so took oxycodone and it took it away. SOB/exertion x2days. Lung ca pt, getting chemo last tx 07/15. ASA 324mg . Not currently having chest pain, had ns 500 bolus for low bp 90/52 Pt A&Ox4. NSR on monitor. Vs wnl

## 2018-11-28 ENCOUNTER — Encounter (HOSPITAL_COMMUNITY): Payer: Self-pay | Admitting: Emergency Medicine

## 2018-11-28 ENCOUNTER — Other Ambulatory Visit: Payer: Self-pay

## 2018-11-28 ENCOUNTER — Observation Stay (HOSPITAL_COMMUNITY)
Admission: EM | Admit: 2018-11-28 | Discharge: 2018-11-29 | Disposition: A | Discharge status: Home / Self Care | Payer: Medicare Other | Attending: Internal Medicine | Admitting: Internal Medicine

## 2018-11-28 DIAGNOSIS — Z955 Presence of coronary angioplasty implant and graft: Secondary | ICD-10-CM | POA: Diagnosis not present

## 2018-11-28 DIAGNOSIS — Z79899 Other long term (current) drug therapy: Secondary | ICD-10-CM | POA: Diagnosis not present

## 2018-11-28 DIAGNOSIS — K219 Gastro-esophageal reflux disease without esophagitis: Secondary | ICD-10-CM | POA: Diagnosis not present

## 2018-11-28 DIAGNOSIS — I10 Essential (primary) hypertension: Secondary | ICD-10-CM | POA: Insufficient documentation

## 2018-11-28 DIAGNOSIS — E785 Hyperlipidemia, unspecified: Secondary | ICD-10-CM | POA: Diagnosis not present

## 2018-11-28 DIAGNOSIS — Z791 Long term (current) use of non-steroidal anti-inflammatories (NSAID): Secondary | ICD-10-CM | POA: Insufficient documentation

## 2018-11-28 DIAGNOSIS — Z87891 Personal history of nicotine dependence: Secondary | ICD-10-CM | POA: Insufficient documentation

## 2018-11-28 DIAGNOSIS — D61818 Other pancytopenia: Secondary | ICD-10-CM | POA: Diagnosis not present

## 2018-11-28 DIAGNOSIS — Z0184 Encounter for antibody response examination: Secondary | ICD-10-CM | POA: Diagnosis not present

## 2018-11-28 DIAGNOSIS — F1010 Alcohol abuse, uncomplicated: Secondary | ICD-10-CM | POA: Insufficient documentation

## 2018-11-28 DIAGNOSIS — Z7982 Long term (current) use of aspirin: Secondary | ICD-10-CM | POA: Insufficient documentation

## 2018-11-28 DIAGNOSIS — I252 Old myocardial infarction: Secondary | ICD-10-CM | POA: Diagnosis not present

## 2018-11-28 DIAGNOSIS — E119 Type 2 diabetes mellitus without complications: Secondary | ICD-10-CM | POA: Insufficient documentation

## 2018-11-28 DIAGNOSIS — C7951 Secondary malignant neoplasm of bone: Secondary | ICD-10-CM | POA: Diagnosis not present

## 2018-11-28 DIAGNOSIS — Z1159 Encounter for screening for other viral diseases: Secondary | ICD-10-CM | POA: Diagnosis not present

## 2018-11-28 DIAGNOSIS — C3491 Malignant neoplasm of unspecified part of right bronchus or lung: Secondary | ICD-10-CM | POA: Diagnosis not present

## 2018-11-28 DIAGNOSIS — Z794 Long term (current) use of insulin: Secondary | ICD-10-CM | POA: Diagnosis not present

## 2018-11-28 DIAGNOSIS — E871 Hypo-osmolality and hyponatremia: Secondary | ICD-10-CM | POA: Diagnosis present

## 2018-11-28 DIAGNOSIS — E039 Hypothyroidism, unspecified: Secondary | ICD-10-CM | POA: Diagnosis not present

## 2018-11-28 DIAGNOSIS — E876 Hypokalemia: Secondary | ICD-10-CM | POA: Diagnosis present

## 2018-11-28 DIAGNOSIS — D696 Thrombocytopenia, unspecified: Secondary | ICD-10-CM | POA: Diagnosis present

## 2018-11-28 DIAGNOSIS — Z720 Tobacco use: Secondary | ICD-10-CM | POA: Diagnosis present

## 2018-11-28 DIAGNOSIS — Z79890 Hormone replacement therapy (postmenopausal): Secondary | ICD-10-CM | POA: Insufficient documentation

## 2018-11-28 LAB — HEPATIC FUNCTION PANEL
ALT: 27 U/L (ref 0–44)
AST: 21 U/L (ref 15–41)
Albumin: 2.6 g/dL — ABNORMAL LOW (ref 3.5–5.0)
Alkaline Phosphatase: 111 U/L (ref 38–126)
Bilirubin, Direct: 0.5 mg/dL — ABNORMAL HIGH (ref 0.0–0.2)
Indirect Bilirubin: 0.6 mg/dL (ref 0.3–0.9)
Total Bilirubin: 1.1 mg/dL (ref 0.3–1.2)
Total Protein: 6.2 g/dL — ABNORMAL LOW (ref 6.5–8.1)

## 2018-11-28 LAB — BASIC METABOLIC PANEL
Anion gap: 10 (ref 5–15)
BUN: 23 mg/dL (ref 8–23)
CO2: 21 mmol/L — ABNORMAL LOW (ref 22–32)
Calcium: 7.9 mg/dL — ABNORMAL LOW (ref 8.9–10.3)
Chloride: 100 mmol/L (ref 98–111)
Creatinine, Ser: 1.38 mg/dL — ABNORMAL HIGH (ref 0.61–1.24)
GFR calc Af Amer: >60 mL/min (ref >60)
GFR calc non Af Amer: 52 mL/min — ABNORMAL LOW (ref >60)
Glucose, Bld: 142 mg/dL — ABNORMAL HIGH (ref 70–99)
Potassium: 3.4 mmol/L — ABNORMAL LOW (ref 3.5–5.1)
Sodium: 131 mmol/L — ABNORMAL LOW (ref 135–145)

## 2018-11-28 MED ORDER — SODIUM CHLORIDE 0.9 % IV BOLUS
500.0000 mL | Freq: Once | INTRAVENOUS | Status: AC
  Administered 2018-11-28: 500 mL via INTRAVENOUS

## 2018-11-28 NOTE — ED Provider Notes (Signed)
[facility] DEPT Provider Note   CSN: [id number] Arrival date & time: 11/28/18  2051     History   Chief Complaint Chief Complaint  Patient presents with  . Dehydration    HPI Zachary Knox is a 68 y.o. male.     Patient with PMH of stage 4 lung cancer with osseous metastases, diabetes, presents to the emergency department with a chief complaint of fatigue and generalized weakness.  He states that the symptoms have been gradually worsening over the past few days.  He denies any fever, chills, cough.  He states that he had some indigestion yesterday, and was seen in the emergency department for the same.  He had laboratory work-up and was discharged home with Carafate.  He states that he has continued to feel weak.  He denies any pain today.  Denies any other associated symptoms.  States that he felt a little better after getting some fluid by EMS.  The history is provided by the patient. No language interpreter was used.    Past Medical History:  Diagnosis Date  . Cancer ([place]) 2020   lung  . Diabetes mellitus without complication ([place])   . Dyspnea    increased exertion    Patient Active Problem List   Diagnosis Date Noted  . Port-A-Cath in place 11/17/2018  . Encounter for antineoplastic immunotherapy 10/20/2018  . Adenocarcinoma of right lung, stage 4 ([place]) 08/19/2018  . Encounter for antineoplastic chemotherapy 08/19/2018  . Goals of care, counseling/discussion 08/19/2018  . Osseous metastasis ([place]) 07/26/2018  . 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 Tobacco abuse disorder 07/12/2018  . Alcohol abuse 07/12/2018    Past Surgical History:  Procedure Laterality Date  . CARDIAC SURGERY     Heart attack in 2008 with stent placement  . IR IMAGING GUIDED PORT INSERTION  11/01/2018  . stent in heart          Home Medications    Prior to Admission medications   Medication Sig Start Date End Date Taking? Authorizing Provider   acetaminophen (TYLENOL) 500 MG tablet Take 1,000 mg by mouth every 6 (six) hours as needed for mild pain.     [provider]  amLODipine (NORVASC) 5 MG tablet Take 5 mg by mouth daily. 05/30/18   [provider]  aspirin EC 81 MG tablet Take 81 mg by mouth daily.     [provider]  atorvastatin (LIPITOR) 20 MG tablet Take 20 mg by mouth daily.    [provider]  carvedilol (COREG) 3.125 MG tablet Take 3.125 mg by mouth 2 (two) times daily with a meal.    [provider]  diphenhydrAMINE (BENADRYL) 25 MG tablet Take 1 tablet (25 mg total) by mouth every 6 (six) hours. 08/23/12   Marny Lowenstein, PA-C  famotidine (PEPCID) 20 MG tablet Take 1 tablet (20 mg total) by mouth 2 (two) times daily. 08/23/12   Marny Lowenstein, PA-C  folic acid (FOLVITE) 1 MG tablet Take 1 tablet (1 mg total) by mouth daily. 10/20/18   Curt Bears, MD  gabapentin (NEURONTIN) 400 MG capsule Take 1 capsule by mouth every 8 (eight) hours. 08/17/18   [provider]  ibuprofen (ADVIL,MOTRIN) 200 MG tablet Take 200 mg by mouth every 6 (six) hours as needed for moderate pain.    [provider]  levothyroxine (SYNTHROID, LEVOTHROID) 100 MCG tablet Take 100 mcg by mouth daily. 06/26/18   [provider]  lidocaine (XYLOCAINE)  2 % solution Use as directed 15 mLs in the mouth or throat every 6 (six) hours as needed (mouth/throat pain). Swish and swallow 09/22/18   Gery Pray, MD  metFORMIN (GLUCOPHAGE) 1000 MG tablet Take 500-1,000 mg by mouth See admin instructions. Take 1000mg  in the am and 500mg  in the pm.    [provider]  metoprolol succinate (TOPROL-XL) 100 MG 24 hr tablet Take 100 mg by mouth daily. Take with or immediately following a meal.    [provider]  oxyCODONE-acetaminophen (PERCOCET/ROXICET) 5-325 MG tablet Take 1 tablet by mouth every 4 (four) hours as needed for severe pain. 10/18/18   Gery Pray, MD  pantoprazole  (PROTONIX) 40 MG tablet Take 40 mg by mouth 2 (two) times daily. 05/17/18   [provider]  prochlorperazine (COMPAZINE) 10 MG tablet Take 1 tablet (10 mg total) by mouth every 6 (six) hours as needed for nausea or vomiting. Patient not taking: Reported on 08/30/2018 08/20/18   Curt Bears, MD  ramipril (ALTACE) 10 MG capsule Take 10 mg by mouth daily. 04/21/18   [provider]  sucralfate (CARAFATE) 1 GM/10ML suspension Take 10 mLs (1 g total) by mouth 3 (three) times daily as needed. 11/27/18   Quintella Reichert, MD  TOUJEO SOLOSTAR 300 UNIT/ML SOPN Inject 18 Units into the skin every morning. 06/16/18   [provider]  traMADol (ULTRAM) 50 MG tablet Take 1 tablet (50 mg total) by mouth every 6 (six) hours as needed. Patient taking differently: Take 50 mg by mouth every 6 (six) hours as needed for moderate pain.  07/05/18   Lajean Saver, MD    Family History No family history on file.  Social History Social History   Tobacco Use  . Smoking status: Former Research scientist (life sciences)  . Smokeless tobacco: Never Used  Substance Use Topics  . Alcohol use: Yes    Comment: occassional   . Drug use: Never     Allergies   Patient has no known allergies.   Review of Systems Review of Systems  All other systems reviewed and are negative.    Physical Exam Updated Vital Signs BP (!) 91/59 (BP Location: Left Arm)   Pulse 95   Temp 98.4 F (36.9 C) (Oral)   Resp 17   Ht 5\' 9"  (1.753 m)   Wt 80.3 kg   SpO2 99%   BMI 26.14 kg/m   Physical Exam Vitals signs and nursing note reviewed.  Constitutional:      Appearance: He is well-developed.  HENT:     Head: Normocephalic and atraumatic.  Eyes:     Conjunctiva/sclera: Conjunctivae normal.  Neck:     Musculoskeletal: Neck supple.  Cardiovascular:     Rate and Rhythm: Normal rate and regular rhythm.     Heart sounds: No murmur.  Pulmonary:     Effort: Pulmonary effort is normal. No respiratory distress.     Breath  sounds: Normal breath sounds.  Abdominal:     Palpations: Abdomen is soft.     Tenderness: There is no abdominal tenderness.  Skin:    General: Skin is warm and dry.  Neurological:     Mental Status: He is alert and oriented to person, place, and time.  Psychiatric:        Mood and Affect: Mood normal.        Behavior: Behavior normal.      ED Treatments / Results  Labs (all labs ordered are listed, but only abnormal results are  displayed) Labs Reviewed  BASIC METABOLIC PANEL - Abnormal; Notable for the following components:      Result Value   Sodium 131 (*)    Potassium 3.4 (*)    CO2 21 (*)    Glucose, Bld 142 (*)    Creatinine, Ser 1.38 (*)    Calcium 7.9 (*)    GFR calc non Af Amer 52 (*)    All other components within normal limits  CBC - Abnormal; Notable for the following components:   WBC 2.8 (*)    RBC 2.86 (*)    Hemoglobin 9.2 (*)    HCT 27.3 (*)    Platelets 8 (*)    All other components within normal limits  HEPATIC FUNCTION PANEL - Abnormal; Notable for the following components:   Total Protein 6.2 (*)    Albumin 2.6 (*)    Bilirubin, Direct 0.5 (*)    All other components within normal limits  URINALYSIS, ROUTINE W REFLEX MICROSCOPIC  PREPARE PLATELET PHERESIS    EKG None  Radiology Dg Chest 2 View  Result Date: 11/27/2018 CLINICAL DATA:  Chest pain. History of lung cancer. EXAM: CHEST - 2 VIEW COMPARISON:  Chest x-rays dated 08/16/2018 and 08/10/2018 and chest CT dated 10/12/2018 FINDINGS: Power port in place with the tip at the cavoatrial junction. Heart size and vascularity are normal. There is increased density in the posterior aspect of the right lower lobe in the area of the patient's known tumor. There is increased thickening along the fissures in the midzone at the right base laterally. The left lung is clear. No effusions. No bone abnormality. IMPRESSION: 1. Slight density in the posterior aspect of the right lower lobe in the area of the  patient's known tumor. 2. Increased thickening of the fissures on the right. Electronically Signed   By: Lorriane Shire M.D.   On: 11/27/2018 15:49    Procedures Procedures (including critical care time) CRITICAL CARE Performed by: Montine Circle  Critically low platelets, requiring transfusion Total critical care time: 35 minutes  Critical care time was exclusive of separately billable procedures and treating other patients.  Critical care was necessary to treat or prevent imminent or life-threatening deterioration.  Critical care was time spent personally by me on the following activities: development of treatment plan with patient and/or surrogate as well as nursing, discussions with consultants, evaluation of patient's response to treatment, examination of patient, obtaining history from patient or surrogate, ordering and performing treatments and interventions, ordering and review of laboratory studies, ordering and review of radiographic studies, pulse oximetry and re-evaluation of patient's condition.  Medications Ordered in ED Medications  sodium chloride 0.9 % bolus 500 mL (has no administration in time range)     Initial Impression / Assessment and Plan / ED Course  I have reviewed the triage vital signs and the nursing notes.  Pertinent labs & imaging results that were available during my care of the patient were reviewed by me and considered in my medical decision making (see chart for details).        Patient with stage IV lung cancer.  Was seen yesterday for some chest discomfort, was noted to have thrombocytopenia at 10.  Dr. Marin Olp was consulted, and recommended repeat blood work on Monday.  Patient returned to the emergency department tonight due to fatigue.  His blood pressure was a little bit low in the 16X systolic, I gave him a liter and a half of fluid with significant improvement.  Patient  states that he feels much better now.  Laboratory work-up is notable  for platelet count of 8.  Due to down-trending platelets and no source of bleed, will consult heme/onc.  I discussed case with Dr. Marin Olp, who at this point recommends transfusion of platelets and admission to the hospital.  Appreciate Dr. Maudie Mercury for admitting the patient.  Final Clinical Impressions(s) / ED Diagnoses   Final diagnoses:  Thrombocytopenia [facility])    ED Discharge Orders    None       Montine Circle, Hershal Coria 11/29/18 4712    Virgel Manifold, MD 11/30/18 [phone]

## 2018-11-28 NOTE — ED Notes (Signed)
Date and time results received: 11/28/18 "10:43 PM  (use smartphrase ".now" to insert current time)  Test: Platelets Critical Value: 8,000  Name of Provider Notified: Montine Circle, PA-C  Orders Received? Or Actions Taken?: Will follow up per protocol

## 2018-11-28 NOTE — ED Notes (Signed)
Pt provided with ice chips and water.

## 2018-11-28 NOTE — ED Triage Notes (Signed)
Stage 4 lung CA pt arriving via [facility] from home with decreased PO intake and increased weakness. Pt reports feeling physically and emotionally exhausted.

## 2018-11-29 ENCOUNTER — Encounter (HOSPITAL_COMMUNITY): Payer: Self-pay | Admitting: Internal Medicine

## 2018-11-29 DIAGNOSIS — E871 Hypo-osmolality and hyponatremia: Secondary | ICD-10-CM | POA: Diagnosis not present

## 2018-11-29 DIAGNOSIS — C3491 Malignant neoplasm of unspecified part of right bronchus or lung: Secondary | ICD-10-CM

## 2018-11-29 DIAGNOSIS — E876 Hypokalemia: Secondary | ICD-10-CM

## 2018-11-29 DIAGNOSIS — Z720 Tobacco use: Secondary | ICD-10-CM

## 2018-11-29 DIAGNOSIS — D696 Thrombocytopenia, unspecified: Secondary | ICD-10-CM | POA: Diagnosis not present

## 2018-11-29 LAB — CBC
HCT: 27.3 % — ABNORMAL LOW (ref 39.0–52.0)
HCT: 29 % — ABNORMAL LOW (ref 39.0–52.0)
Hemoglobin: 9.2 g/dL — ABNORMAL LOW (ref 13.0–17.0)
Hemoglobin: 9.7 g/dL — ABNORMAL LOW (ref 13.0–17.0)
MCH: 32.2 pg (ref 26.0–34.0)
MCH: 32.3 pg (ref 26.0–34.0)
MCHC: 33.7 g/dL (ref 30.0–36.0)
MCHC: 33.4 g/dL (ref 30.0–36.0)
MCV: 95.5 fL (ref 80.0–100.0)
MCV: 96.7 fL (ref 80.0–100.0)
Platelets: 8 10*3/uL — CL (ref 150–400)
Platelets: 30 10*3/uL — ABNORMAL LOW (ref 150–400)
RBC: 2.86 MIL/uL — ABNORMAL LOW (ref 4.22–5.81)
RBC: 3 MIL/uL — ABNORMAL LOW (ref 4.22–5.81)
RDW: 13.3 % (ref 11.5–15.5)
RDW: 13.4 % (ref 11.5–15.5)
WBC: 2.8 10*3/uL — ABNORMAL LOW (ref 4.0–10.5)
WBC: 4.2 10*3/uL (ref 4.0–10.5)
nRBC: 0 % (ref 0.0–0.2)
nRBC: 0 % (ref 0.0–0.2)

## 2018-11-29 LAB — URINALYSIS, ROUTINE W REFLEX MICROSCOPIC
Bilirubin Urine: NEGATIVE
Glucose, UA: NEGATIVE mg/dL
Hgb urine dipstick: NEGATIVE
Ketones, ur: NEGATIVE mg/dL
Leukocytes,Ua: NEGATIVE
Nitrite: NEGATIVE
Protein, ur: NEGATIVE mg/dL
Specific Gravity, Urine: 1.014 (ref 1.005–1.030)
pH: 5 (ref 5.0–8.0)

## 2018-11-29 LAB — ABO/RH: ABO/RH(D): A POS

## 2018-11-29 LAB — SARS CORONAVIRUS 2 BY RT PCR (HOSPITAL ORDER, PERFORMED IN ~~LOC~~ HOSPITAL LAB): SARS Coronavirus 2: NEGATIVE

## 2018-11-29 LAB — PATHOLOGIST SMEAR REVIEW

## 2018-11-29 LAB — COMPREHENSIVE METABOLIC PANEL
ALT: 25 U/L (ref 0–44)
AST: 20 U/L (ref 15–41)
Albumin: 2.6 g/dL — ABNORMAL LOW (ref 3.5–5.0)
Alkaline Phosphatase: 108 U/L (ref 38–126)
Anion gap: 11 (ref 5–15)
BUN: 13 mg/dL (ref 8–23)
CO2: 23 mmol/L (ref 22–32)
Calcium: 8.2 mg/dL — ABNORMAL LOW (ref 8.9–10.3)
Chloride: 102 mmol/L (ref 98–111)
Creatinine, Ser: 0.87 mg/dL (ref 0.61–1.24)
GFR calc Af Amer: >60 mL/min (ref >60)
GFR calc non Af Amer: >60 mL/min (ref >60)
Glucose, Bld: 140 mg/dL — ABNORMAL HIGH (ref 70–99)
Potassium: 3.5 mmol/L (ref 3.5–5.1)
Sodium: 136 mmol/L (ref 135–145)
Total Bilirubin: 0.8 mg/dL (ref 0.3–1.2)
Total Protein: 6.1 g/dL — ABNORMAL LOW (ref 6.5–8.1)

## 2018-11-29 LAB — TYPE AND SCREEN
ABO/RH(D): A POS
Antibody Screen: NEGATIVE

## 2018-11-29 LAB — LACTATE DEHYDROGENASE: LDH: 184 U/L (ref 98–192)

## 2018-11-29 MED ORDER — INSULIN ASPART 100 UNIT/ML ~~LOC~~ SOLN
0.0000 [IU] | Freq: Three times a day (TID) | SUBCUTANEOUS | Status: DC
  Filled 2018-11-29: qty 0.09

## 2018-11-29 MED ORDER — METOPROLOL SUCCINATE ER 50 MG PO TB24
100.0000 mg | ORAL_TABLET | Freq: Every day | ORAL | Status: DC
  Administered 2018-11-29: 100 mg via ORAL
  Filled 2018-11-29: qty 1
  Filled 2018-11-29: qty 2

## 2018-11-29 MED ORDER — POTASSIUM CHLORIDE IN NACL 20-0.9 MEQ/L-% IV SOLN
INTRAVENOUS | Status: DC
  Administered 2018-11-29: 1000 mL via INTRAVENOUS
  Filled 2018-11-29: qty 1000

## 2018-11-29 MED ORDER — SUCRALFATE 1 GM/10ML PO SUSP
1.0000 g | Freq: Three times a day (TID) | ORAL | Status: DC
  Administered 2018-11-29: 09:00:00 1 g via ORAL
  Filled 2018-11-29: qty 10

## 2018-11-29 MED ORDER — LIDOCAINE VISCOUS HCL 2 % MT SOLN: 15.0000 mL | Freq: Four times a day (QID) | OROMUCOSAL | Status: DC | PRN

## 2018-11-29 MED ORDER — LEVOTHYROXINE SODIUM 100 MCG PO TABS
100.0000 ug | ORAL_TABLET | Freq: Every day | ORAL | Status: DC
  Filled 2018-11-29: qty 1

## 2018-11-29 MED ORDER — METFORMIN HCL 500 MG PO TABS
1000.0000 mg | ORAL_TABLET | Freq: Every day | ORAL | Status: DC
  Administered 2018-11-29: 1000 mg via ORAL
  Filled 2018-11-29: qty 2

## 2018-11-29 MED ORDER — ATORVASTATIN CALCIUM 10 MG PO TABS
20.0000 mg | ORAL_TABLET | Freq: Every day | ORAL | Status: DC
  Administered 2018-11-29: 09:00:00 20 mg via ORAL
  Filled 2018-11-29: qty 1
  Filled 2018-11-29: qty 2

## 2018-11-29 MED ORDER — FOLIC ACID 1 MG PO TABS
1.0000 mg | ORAL_TABLET | Freq: Every day | ORAL | Status: DC
  Administered 2018-11-29: 1 mg via ORAL
  Filled 2018-11-29: qty 1

## 2018-11-29 MED ORDER — GABAPENTIN 400 MG PO CAPS: 400.0000 mg | ORAL_CAPSULE | Freq: Every day | ORAL | Status: DC

## 2018-11-29 MED ORDER — ACETAMINOPHEN 325 MG PO TABS: 650.0000 mg | ORAL_TABLET | Freq: Four times a day (QID) | ORAL | Status: DC | PRN

## 2018-11-29 MED ORDER — ACETAMINOPHEN 650 MG RE SUPP: 650.0000 mg | Freq: Four times a day (QID) | RECTAL | Status: DC | PRN

## 2018-11-29 MED ORDER — PANTOPRAZOLE SODIUM 40 MG PO TBEC
40.0000 mg | DELAYED_RELEASE_TABLET | Freq: Two times a day (BID) | ORAL | Status: DC
  Administered 2018-11-29: 40 mg via ORAL
  Filled 2018-11-29: qty 1

## 2018-11-29 MED ORDER — SODIUM CHLORIDE 0.9 % IV BOLUS
500.0000 mL | Freq: Once | INTRAVENOUS | Status: AC
  Administered 2018-11-29: 500 mL via INTRAVENOUS

## 2018-11-29 MED ORDER — AMLODIPINE BESYLATE 5 MG PO TABS
5.0000 mg | ORAL_TABLET | Freq: Every day | ORAL | Status: DC
  Administered 2018-11-29: 5 mg via ORAL
  Filled 2018-11-29: qty 1

## 2018-11-29 MED ORDER — METFORMIN HCL 500 MG PO TABS: 500.0000 mg | ORAL_TABLET | ORAL | Status: DC

## 2018-11-29 MED ORDER — INSULIN ASPART 100 UNIT/ML ~~LOC~~ SOLN
0.0000 [IU] | Freq: Every day | SUBCUTANEOUS | Status: DC
  Filled 2018-11-29: qty 0.05

## 2018-11-29 MED ORDER — DIPHENHYDRAMINE HCL 25 MG PO TABS
25.0000 mg | ORAL_TABLET | Freq: Four times a day (QID) | ORAL | Status: DC
  Administered 2018-11-29 (×2): 25 mg via ORAL
  Filled 2018-11-29 (×4): qty 1

## 2018-11-29 MED ORDER — RAMIPRIL 10 MG PO CAPS
10.0000 mg | ORAL_CAPSULE | Freq: Every day | ORAL | Status: DC
  Administered 2018-11-29: 10 mg via ORAL
  Filled 2018-11-29: qty 1

## 2018-11-29 MED ORDER — OXYCODONE-ACETAMINOPHEN 5-325 MG PO TABS: 1.0000 | ORAL_TABLET | ORAL | Status: DC | PRN

## 2018-11-29 MED ORDER — SODIUM CHLORIDE 0.9 % IV SOLN
10.0000 mL/h | Freq: Once | INTRAVENOUS | Status: AC
  Administered 2018-11-29: 10 mL/h via INTRAVENOUS

## 2018-11-29 NOTE — ED Notes (Signed)
Pharmacy notified of verifying medication.

## 2018-11-29 NOTE — ED Notes (Addendum)
Update given to Brother Jazir Newey: [phone]

## 2018-11-29 NOTE — Care Management Obs Status (Signed)
[facility] NOTIFICATION   Patient Details  Name: Zachary Knox MRN: [id number] Date of Birth: 03/02/1951   Medicare Observation Status Notification Given:  Yes    Leeroy Cha, RN 11/29/2018, 1:54 PM

## 2018-11-29 NOTE — ED Notes (Signed)
Pt is aware of needing a urine sample. Urinal was given to pt, pt states he will call out when he is ready to use the bathroom.

## 2018-11-29 NOTE — Discharge Summary (Signed)
Physician Discharge Summary  DEANE WATTENBARGER MWU:[id number] DOB: 07/19/1950 DOA: 11/28/2018  PCP: Vincente Liberty, MD  Admit date: 11/28/2018 Discharge date: 11/29/2018  Admitted From: Inpatient Disposition: home  Recommendations for Outpatient Follow-up:  1. Follow up with PCP in 1-2 weeks 2. Please obtain BMP/CBC in one week   Home Health:No Equipment/Devices:no new equip  Discharge Condition:Stable CODE STATUS:Full code Diet recommendation: Regular healthy diet  Brief/Interim Summary: Zachary Knox  is a 68 y.o. 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 ED spoke with oncology (Dr. Marin Olp) who requested admission for platelets.   Recently received  Pembrolizumab, Carboplatin , Pemetrexed  10/24/2018  In ED,  T 98.4  p 95  R 17 Bp 91/59  Pox 99% on RA Wt 80.3 kg  Na 131, K 3.4, Bun 23, Creatinine 1.38 Alb 2.6 Ast 21, Alt 27, alk phos 111, T. Bili 1.1  Wbc 2.8, Hgb 9.2, Plt 8  Urinalysis negative  Hospital course: NSCLC; Pt being followed by [facility] as outpt, discussed with dr Marin Olp again this AM by me, recommended platelets and if stable with no bleeding, d/c home and f/up in hem onc.  Repeat cbc shows platelets of 30, no bleeding, pt feels stable and would rather be home.  Thrombocytopenia; please see above,  Htn; pt to continue his home meds on discharge, defer to pcp for adjustment  HLD: C/W statin on d/c  Hypothyroid; no evid of obvious hormonal dysregulation, c/w synthroid at 111mg daily    Discharge Diagnoses:  Principal Problem:   Thrombocytopenia (HCC) Active Problems:   Tobacco abuse disorder   Alcohol abuse   Adenocarcinoma of right lung, stage 4 (HCC)   Hypokalemia   Hyponatremia    Discharge Instructions  Discharge Instructions    Call MD  for:   Complete by: As directed    For any acute change in medical condition including bleeding   Call MD for:  difficulty breathing, headache or visual disturbances   Complete by: As directed    Call MD for:  hives   Complete by: As directed    Call MD for:  persistant dizziness or light-headedness   Complete by: As directed    Call MD for:  persistant nausea and vomiting   Complete by: As directed    Call MD for:  redness, tenderness, or signs of infection (pain, swelling, redness, odor or green/yellow discharge around incision site)   Complete by: As directed    Call MD for:  severe uncontrolled pain   Complete by: As directed    Call MD for:  temperature >100.4   Complete by: As directed    Diet - low sodium heart healthy   Complete by: As directed    Increase activity slowly   Complete by: As directed      Allergies as of 11/29/2018   No Known Allergies     Medication List    TAKE these medications   acetaminophen 500 MG tablet Commonly known as: TYLENOL Take 1,000 mg by mouth every 6 (six) hours as needed for mild pain.   amLODipine 5 MG tablet Commonly known as: NORVASC Take 5 mg by mouth daily.   aspirin EC 81 MG tablet Take 81 mg by mouth daily.   atorvastatin 20 MG tablet Commonly known as: LIPITOR Take 20 mg by mouth daily.   diphenhydrAMINE  25 MG tablet Commonly known as: BENADRYL Take 1 tablet (25 mg total) by mouth every 6 (six) hours.   famotidine 20 MG tablet Commonly known as: Pepcid Take 1 tablet (20 mg total) by mouth 2 (two) times daily.   folic acid 1 MG tablet Commonly known as: FOLVITE Take 1 tablet (1 mg total) by mouth daily.   gabapentin 400 MG capsule Commonly known as: NEURONTIN Take 400 mg by mouth at bedtime.   ibuprofen 200 MG tablet Commonly known as: ADVIL Take 200 mg by mouth every 6 (six) hours as needed for moderate pain.   levothyroxine 100 MCG tablet Commonly known as: SYNTHROID Take 100 mcg by mouth daily.    lidocaine 2 % solution Commonly known as: XYLOCAINE Use as directed 15 mLs in the mouth or throat every 6 (six) hours as needed (mouth/throat pain). Swish and swallow   metFORMIN 1000 MG tablet Commonly known as: GLUCOPHAGE Take 500-1,000 mg by mouth See admin instructions. Take 1000 mg every morning and 500 mg at lunch and supper   metoprolol succinate 100 MG 24 hr tablet Commonly known as: TOPROL-XL Take 100 mg by mouth daily. Take with or immediately following a meal.   oxyCODONE-acetaminophen 5-325 MG tablet Commonly known as: PERCOCET/ROXICET Take 1 tablet by mouth every 4 (four) hours as needed for severe pain.   pantoprazole 40 MG tablet Commonly known as: PROTONIX Take 40 mg by mouth 2 (two) times daily.   prochlorperazine 10 MG tablet Commonly known as: COMPAZINE Take 1 tablet (10 mg total) by mouth every 6 (six) hours as needed for nausea or vomiting.   ramipril 10 MG capsule Commonly known as: ALTACE Take 10 mg by mouth daily.   sucralfate 1 GM/10ML suspension Commonly known as: Carafate Take 10 mLs (1 g total) by mouth 3 (three) times daily as needed. What changed: when to take this   Toujeo SoloStar 300 UNIT/ML Sopn Generic drug: Insulin Glargine (1 Unit Dial) Inject 18 Units into the skin every morning.   traMADol 50 MG tablet Commonly known as: ULTRAM Take 1 tablet (50 mg total) by mouth every 6 (six) hours as needed.       No Known Allergies  Consultations:  phoen communication with [facility]   Procedures/Studies: Dg Chest 2 View  Result Date: 11/27/2018 CLINICAL DATA:  Chest pain. History of lung cancer. EXAM: CHEST - 2 VIEW COMPARISON:  Chest x-rays dated 08/16/2018 and 08/10/2018 and chest CT dated 10/12/2018 FINDINGS: Power port in place with the tip at the cavoatrial junction. Heart size and vascularity are normal. There is increased density in the posterior aspect of the right lower lobe in the area of the patient's known tumor. There is  increased thickening along the fissures in the midzone at the right base laterally. The left lung is clear. No effusions. No bone abnormality. IMPRESSION: 1. Slight density in the posterior aspect of the right lower lobe in the area of the patient's known tumor. 2. Increased thickening of the fissures on the right. Electronically Signed   By: Lorriane Shire M.D.   On: 11/27/2018 15:49   Ir Imaging Guided Port Insertion  Result Date: 11/01/2018 INDICATION: History of metastatic lung cancer. In need of durable intravenous access for chemotherapy administration. EXAM: IMPLANTED PORT A CATH PLACEMENT WITH ULTRASOUND AND FLUOROSCOPIC GUIDANCE COMPARISON:  Chest CT-10/12/2018 MEDICATIONS: Ancef 2 gm IV; The antibiotic was administered within an appropriate time interval prior to skin puncture. ANESTHESIA/SEDATION: Moderate (conscious) sedation was employed during this procedure.  A total of Versed 2 mg and Fentanyl 100 mcg was administered intravenously. Moderate Sedation Time: 24 minutes. The patient's level of consciousness and vital signs were monitored continuously by radiology nursing throughout the procedure under my direct supervision. CONTRAST:  None FLUOROSCOPY TIME:  42 seconds (12 mGy) COMPLICATIONS: None immediate. PROCEDURE: The procedure, risks, benefits, and alternatives were explained to the patient. Questions regarding the procedure were encouraged and answered. The patient understands and consents to the procedure. The right neck and chest were prepped with chlorhexidine in a sterile fashion, and a sterile drape was applied covering the operative field. Maximum barrier sterile technique with sterile gowns and gloves were used for the procedure. A timeout was performed prior to the initiation of the procedure. Local anesthesia was provided with 1% lidocaine with epinephrine. After creating a small venotomy incision, a micropuncture kit was utilized to access the internal jugular vein. Real-time  ultrasound guidance was utilized for vascular access including the acquisition of a permanent ultrasound image documenting patency of the accessed vessel. The microwire was utilized to measure appropriate catheter length. A subcutaneous port pocket was then created along the upper chest wall utilizing a combination of sharp and blunt dissection. The pocket was irrigated with sterile saline. A single lumen slim power injectable port was chosen for placement. The 8 Fr catheter was tunneled from the port pocket site to the venotomy incision. The port was placed in the pocket. The external catheter was trimmed to appropriate length. At the venotomy, an 8 Fr peel-away sheath was placed over a guidewire under fluoroscopic guidance. The catheter was then placed through the sheath and the sheath was removed. Final catheter positioning was confirmed and documented with a fluoroscopic spot radiograph. The port was accessed with a Huber needle, aspirated and flushed with heparinized saline. The venotomy site was closed with an interrupted 4-0 Vicryl suture. The port pocket incision was closed with interrupted 2-0 Vicryl suture. Dermabond and Steri-strips were applied to both incisions. Dressings were placed. The patient tolerated the procedure well without immediate post procedural complication. FINDINGS: After catheter placement, the tip lies within the superior cavoatrial junction. The catheter aspirates and flushes normally and is ready for immediate use. IMPRESSION: Successful placement of a right internal jugular approach power injectable Port-A-Cath. The catheter is ready for immediate use. Electronically Signed   By: Sandi Mariscal M.D.   On: 11/01/2018 14:14       Subjective: Reports feels good today, no events overnight, no bleeding  Discharge Exam: Vitals:   11/29/18 0924 11/29/18 0925  BP: 112/78 112/78  Pulse: 81   Resp:    Temp:    SpO2:     Vitals:   11/29/18 0645 11/29/18 0700 11/29/18 0924  11/29/18 0925  BP: 100/83 112/78 112/78 112/78  Pulse: 82 81 81   Resp: 15 16    Temp:      TempSrc:      SpO2: 100% 97%    Weight:      Height:        General: Pt is alert, awake, not in acute distress, chronically ill-appearing Cardiovascular: RRR, S1/S2 +, no rubs, no gallops Respiratory: CTA bilaterally, no wheezing, no rhonchi Abdominal: Soft, NT, ND, bowel sounds + Extremities: no edema, no cyanosis    The results of significant diagnostics from this hospitalization (including imaging, microbiology, ancillary and laboratory) are listed below for reference.     Microbiology: Recent Results (from the past 240 hour(s))  SARS Coronavirus 2 (CEPHEID - Performed  in [place] lab), [facility] Order     Status: None   Collection Time: 11/29/18  2:57 AM   Specimen: Nasopharyngeal Swab  Result Value Ref Range Status   SARS Coronavirus 2 NEGATIVE NEGATIVE Final    Comment: (NOTE) If result is NEGATIVE SARS-CoV-2 target nucleic acids are NOT DETECTED. The SARS-CoV-2 RNA is generally detectable in upper and lower  respiratory specimens during the acute phase of infection. The lowest  concentration of SARS-CoV-2 viral copies this assay can detect is 250  copies / mL. A negative result does not preclude SARS-CoV-2 infection  and should not be used as the sole basis for treatment or other  patient management decisions.  A negative result may occur with  improper specimen collection / handling, submission of specimen other  than nasopharyngeal swab, presence of viral mutation(s) within the  areas targeted by this assay, and inadequate number of viral copies  (<250 copies / mL). A negative result must be combined with clinical  observations, patient history, and epidemiological information. If result is POSITIVE SARS-CoV-2 target nucleic acids are DETECTED. The SARS-CoV-2 RNA is generally detectable in upper and lower  respiratory specimens dur ing the acute phase of  infection.  Positive  results are indicative of active infection with SARS-CoV-2.  Clinical  correlation with patient history and other diagnostic information is  necessary to determine patient infection status.  Positive results do  not rule out bacterial infection or co-infection with other viruses. If result is PRESUMPTIVE POSTIVE SARS-CoV-2 nucleic acids MAY BE PRESENT.   A presumptive positive result was obtained on the submitted specimen  and confirmed on repeat testing.  While 2019 novel coronavirus  (SARS-CoV-2) nucleic acids may be present in the submitted sample  additional confirmatory testing may be necessary for epidemiological  and / or clinical management purposes  to differentiate between  SARS-CoV-2 and other Sarbecovirus currently known to infect humans.  If clinically indicated additional testing with an alternate test  methodology [phone]) is advised. The SARS-CoV-2 RNA is generally  detectable in upper and lower respiratory sp ecimens during the acute  phase of infection. The expected result is Negative. Fact Sheet for Patients:  StrictlyIdeas.no Fact Sheet for Healthcare Providers: BankingDealers.co.za This test is not yet approved or cleared by the [place] FDA and has been authorized for detection and/or diagnosis of SARS-CoV-2 by FDA under an Emergency Use Authorization (EUA).  This EUA will remain in effect (meaning this test can be used) for the duration of the COVID-19 declaration under Section 564(b)(1) of the Act, 21 U.S.C. section 360bbb-3(b)(1), unless the authorization is terminated or revoked sooner. Performed at [facility], [place] [address], [place], [place] [address]      Labs: BNP (last 3 results) No results for input(s): BNP in the last 8760 hours. Basic Metabolic Panel: Recent Labs  Lab 11/24/18 1437 11/27/18 1602 11/28/18 2134  NA 135 133* 131*  K 4.5 3.9 3.4*   CL 102 101 100  CO2 21* 21* 21*  GLUCOSE 138* 141* 142*  BUN '14 15 23  ' CREATININE 0.99 1.12 1.38*  CALCIUM 9.0 8.2* 7.9*   Liver Function Tests: Recent Labs  Lab 11/24/18 1437 11/27/18 1602 11/28/18 2214  AST 54* 23 21  ALT 75* 36 27  ALKPHOS 165* 132* 111  BILITOT 1.1 1.4* 1.1  PROT 6.8 6.2* 6.2*  ALBUMIN 3.0* 2.5* 2.6*   Recent Labs  Lab 11/27/18 1602  LIPASE 20   No results for input(s):  AMMONIA in the last 168 hours. CBC: Recent Labs  Lab 11/24/18 1437 11/27/18 1602 11/28/18 2134 11/29/18 0955  WBC 0.6* 0.9* 2.8* 4.2  NEUTROABS 0.1* 0.2*  --   --   HGB 11.1* 10.1* 9.2* 9.7*  HCT 32.8* 29.5* 27.3* 29.0*  MCV 92.9 93.7 95.5 96.7  PLT 64* 10* 8* 30*   Cardiac Enzymes: No results for input(s): CKTOTAL, CKMB, CKMBINDEX, TROPONINI in the last 168 hours. BNP: Invalid input(s): POCBNP CBG: No results for input(s): GLUCAP in the last 168 hours. D-Dimer No results for input(s): DDIMER in the last 72 hours. Hgb A1c No results for input(s): HGBA1C in the last 72 hours. Lipid Profile No results for input(s): CHOL, HDL, LDLCALC, TRIG, CHOLHDL, LDLDIRECT in the last 72 hours. Thyroid function studies No results for input(s): TSH, T4TOTAL, T3FREE, THYROIDAB in the last 72 hours.  Invalid input(s): FREET3 Anemia work up No results for input(s): VITAMINB12, FOLATE, FERRITIN, TIBC, IRON, RETICCTPCT in the last 72 hours. Urinalysis    Component Value Date/Time   COLORURINE YELLOW 11/29/2018 0200   APPEARANCEUR CLEAR 11/29/2018 0200   LABSPEC 1.014 11/29/2018 0200   PHURINE 5.0 11/29/2018 0200   GLUCOSEU NEGATIVE 11/29/2018 0200   HGBUR NEGATIVE 11/29/2018 0200   BILIRUBINUR NEGATIVE 11/29/2018 0200   KETONESUR NEGATIVE 11/29/2018 0200   PROTEINUR NEGATIVE 11/29/2018 0200   UROBILINOGEN 1.0 12/16/2008 0451   NITRITE NEGATIVE 11/29/2018 0200   LEUKOCYTESUR NEGATIVE 11/29/2018 0200   Sepsis Labs Invalid input(s): PROCALCITONIN,  WBC,   LACTICIDVEN Microbiology Recent Results (from the past 240 hour(s))  SARS Coronavirus 2 (CEPHEID - Performed in [place] hospital lab), Hosp Order     Status: None   Collection Time: 11/29/18  2:57 AM   Specimen: Nasopharyngeal Swab  Result Value Ref Range Status   SARS Coronavirus 2 NEGATIVE NEGATIVE Final    Comment: (NOTE) If result is NEGATIVE SARS-CoV-2 target nucleic acids are NOT DETECTED. The SARS-CoV-2 RNA is generally detectable in upper and lower  respiratory specimens during the acute phase of infection. The lowest  concentration of SARS-CoV-2 viral copies this assay can detect is 250  copies / mL. A negative result does not preclude SARS-CoV-2 infection  and should not be used as the sole basis for treatment or other  patient management decisions.  A negative result may occur with  improper specimen collection / handling, submission of specimen other  than nasopharyngeal swab, presence of viral mutation(s) within the  areas targeted by this assay, and inadequate number of viral copies  (<250 copies / mL). A negative result must be combined with clinical  observations, patient history, and epidemiological information. If result is POSITIVE SARS-CoV-2 target nucleic acids are DETECTED. The SARS-CoV-2 RNA is generally detectable in upper and lower  respiratory specimens dur ing the acute phase of infection.  Positive  results are indicative of active infection with SARS-CoV-2.  Clinical  correlation with patient history and other diagnostic information is  necessary to determine patient infection status.  Positive results do  not rule out bacterial infection or co-infection with other viruses. If result is PRESUMPTIVE POSTIVE SARS-CoV-2 nucleic acids MAY BE PRESENT.   A presumptive positive result was obtained on the submitted specimen  and confirmed on repeat testing.  While 2019 novel coronavirus  (SARS-CoV-2) nucleic acids may be present in the submitted sample   additional confirmatory testing may be necessary for epidemiological  and / or clinical management purposes  to differentiate between  SARS-CoV-2 and other Sarbecovirus currently  known to infect humans.  If clinically indicated additional testing with an alternate test  methodology [phone]) is advised. The SARS-CoV-2 RNA is generally  detectable in upper and lower respiratory sp ecimens during the acute  phase of infection. The expected result is Negative. Fact Sheet for Patients:  StrictlyIdeas.no Fact Sheet for Healthcare Providers: BankingDealers.co.za This test is not yet approved or cleared by the [place] FDA and has been authorized for detection and/or diagnosis of SARS-CoV-2 by FDA under an Emergency Use Authorization (EUA).  This EUA will remain in effect (meaning this test can be used) for the duration of the COVID-19 declaration under Section 564(b)(1) of the Act, 21 U.S.C. section 360bbb-3(b)(1), unless the authorization is terminated or revoked sooner. Performed at [facility], [place] [address] [place], [place] [address]      Time coordinating discharge: Over 30 minutes  SIGNED:   Nicolette Bang, MD  [facility] 11/29/2018, 10:19 AM Pager   If 7PM-7AM, please contact night-coverage [email or web address] Password [email or web address]

## 2018-11-29 NOTE — ED Notes (Signed)
Pt attempted to use th bathroom. RN stood by for safety. Pt denies dizziness or light headness on standing.  Pt was unable to void, will try back in 30 min.

## 2018-11-29 NOTE — TOC Progression Note (Signed)
Transition of Care [facility]) - Progression Note    Patient Details  Name: Zachary Knox MRN: [id number] Date of Birth: May 17, 1950  [facility] CM/SW Contact  Leeroy Cha, RN Phone Number: 11/29/2018, 12:20 PM  Clinical Narrative:    dcd to home with self care        Expected Discharge Plan and Services           Expected Discharge Date: 11/29/18                                     Social Determinants of Health (SDOH) Interventions    Readmission Risk Interventions No flowsheet data found.

## 2018-11-29 NOTE — Discharge Instructions (Signed)
Thrombocytopenia Thrombocytopenia means that you have a low number of platelets in your blood. Platelets are tiny cells in the blood. When you bleed, they clump together at the cut or injury to stop the bleeding. This is called blood clotting. If you do not have enough platelets, it can cause bleeding problems. Some cases of this condition are mild while others are more severe. What are the causes? This condition may be caused by:  Your body not making enough platelets. This may be caused by: ? Your bone marrow not making blood cells (aplastic anemia). ? Cancer in the bone marrow. ? Certain medicines. ? Infection in the bone marrow. ? Drinking a lot of alcohol.  Your body destroying platelets too quickly. This may be caused by: ? Certain immune diseases. ? Certain medicines. ? Certain blood clotting disorders. ? Certain disorders that are passed from parent to child (inherited). ? Certain bleeding disorders. ? Pregnancy. ? Having a spleen that is larger than normal. What are the signs or symptoms?  Bleeding that is not normal.  Nosebleeds.  Heavy menstrual periods.  Blood in the pee (urine) or poop (stool).  A purple-like color to the skin (purpura).  Bruising.  A rash that looks like pinpoint, purple-red spots (petechiae). How is this treated?  Treatment of another condition that is causing the low platelet count.  Medicines to help protect your platelets from being destroyed.  A replacement (transfusion) of platelets to stop or prevent bleeding.  Surgery to remove the spleen. Follow these instructions at home: Activity  Avoid activities that could cause you to get hurt or bruised. Follow instructions about how to prevent falls.  Take care not to cut yourself: ? When you shave. ? When you use scissors, needles, knives, or other tools.  Take care not to burn yourself: ? When you use an iron. ? When you cook. General instructions   Check your skin and the  inside of your mouth for bruises or blood as told by your doctor.  Check to see if there is blood in your spit (sputum), pee, and poop. Do this as told by your doctor.  Do not drink alcohol.  Take over-the-counter and prescription medicines only as told by your doctor.  Do not take any medicines that have aspirin or NSAIDs in them. These medicines can thin your blood and cause you to bleed.  Tell all of your doctors that you have this condition. Be sure to tell your dentist and eye doctor too. Contact a doctor if:  You have bruises and you do not know why. Get help right away if:  You are bleeding anywhere on your body.  You have blood in your spit, pee, or poop. Summary  Thrombocytopenia means that you have a low number of platelets in your blood.  Platelets are needed for blood clotting.  Symptoms of this condition include bleeding that is not normal, and bruising.  Take care not to cut or burn yourself. This information is not intended to replace advice given to you by your health care provider. Make sure you discuss any questions you have with your health care provider. Document Released: 04/10/2011 Document Revised: 01/21/2018 Document Reviewed: 01/21/2018 Elsevier Patient Education  2020 [employer].  

## 2018-11-29 NOTE — ED Notes (Signed)
Pt ambulated to bathroom with standby assistance from RN for safety.

## 2018-11-29 NOTE — Progress Notes (Signed)
Discharge instructions explained to patient, no prescriptions given. Patient states feeling stronger. Discharged via wheelchair, brother picking patient up and taking him home.

## 2018-11-29 NOTE — H&P (Addendum)
TRH H&P    Patient Demographics:    Zachary Knox, is a 68 y.o. male  MRN: [id number]  DOB - 05/28/1950  Admit Date - 11/28/2018  Referring MD/NP/PA:    Outpatient Primary MD for the patient is Vincente Liberty, MD Curt Bears - oncology  Patient coming from: home  Chief complaint- thrombocytopenia   HPI:    Zachary Knox  is a 68 y.o. 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 ED spoke with oncology (Dr. Marin Olp) who requested admission for platelets.   Recently received  Pembrolizumab, Carboplatin , Pemetrexed  10/24/2018  In ED,  T 98.4  p 95  R 17 Bp 91/59  Pox 99% on RA Wt 80.3 kg  Na 131, K 3.4, Bun 23, Creatinine 1.38 Alb 2.6 Ast 21, Alt 27, alk phos 111, T. Bili 1.1  Wbc 2.8, Hgb 9.2, Plt 8  Urinalysis negative  covid -19 pending  1 unit platelets ordered by ED, not sure how many Ennever wanted  Pt will be admitted for w/up of thrombocytopenia      Review of systems:    In addition to the HPI above,  No Fever-chills, No Headache, No changes with Vision or hearing, No problems swallowing food or Liquids, No Chest pain, Cough or Shortness of Breath, No Abdominal pain, No Nausea or Vomiting, bowel movements are regular, No Blood in stool or Urine, No dysuria, No new skin rashes or bruises, No new joints pains-aches,  No new weakness, tingling, numbness in any extremity, No recent weight gain or loss, No polyuria, polydypsia or polyphagia, No significant Mental Stressors.  All other systems reviewed and are negative.    Past History of the following :    Past Medical History:  Diagnosis Date  . Cancer ([place]) 2020   lung  . Diabetes mellitus without complication ([place])   .  Dyspnea    increased exertion      Past Surgical History:  Procedure Laterality Date  . CARDIAC SURGERY     Heart attack in 2008 with stent placement  . IR IMAGING GUIDED PORT INSERTION  11/01/2018  . stent in heart        Social History:      Social History   Tobacco Use  . Smoking status: Former Smoker    Types: Cigarettes  . Smokeless tobacco: Never Used  Substance Use Topics  . Alcohol use: Not Currently    Comment: occassional        Family History :    History reviewed. No pertinent family history.  NO FAMILY HX OF THROMBOCYTOPENIA   Home Medications:   Prior to Admission medications   Medication Sig Start Date End Date Taking? Authorizing Provider  acetaminophen (TYLENOL) 500 MG tablet Take 1,000 mg by mouth every 6 (six) hours as needed for mild pain.    Yes [provider]  amLODipine (NORVASC) 5 MG tablet  Take 5 mg by mouth daily. 05/30/18  Yes [provider]  aspirin EC 81 MG tablet Take 81 mg by mouth daily.    Yes [provider]  atorvastatin (LIPITOR) 20 MG tablet Take 20 mg by mouth daily.   Yes [provider]  diphenhydrAMINE (BENADRYL) 25 MG tablet Take 1 tablet (25 mg total) by mouth every 6 (six) hours. 08/23/12  Yes Marny Lowenstein, PA-C  folic acid (FOLVITE) 1 MG tablet Take 1 tablet (1 mg total) by mouth daily. 10/20/18  Yes Curt Bears, MD  gabapentin (NEURONTIN) 400 MG capsule Take 400 mg by mouth at bedtime.  08/17/18  Yes [provider]  ibuprofen (ADVIL,MOTRIN) 200 MG tablet Take 200 mg by mouth every 6 (six) hours as needed for moderate pain.   Yes [provider]  levothyroxine (SYNTHROID, LEVOTHROID) 100 MCG tablet Take 100 mcg by mouth daily. 06/26/18  Yes [provider]  lidocaine (XYLOCAINE) 2 % solution Use as directed 15 mLs in the mouth or throat every 6 (six) hours as needed (mouth/throat pain). Swish and swallow 09/22/18  Yes Gery Pray, MD  metFORMIN  (GLUCOPHAGE) 1000 MG tablet Take 500-1,000 mg by mouth See admin instructions. Take 1000 mg every morning and 500 mg at lunch and supper   Yes [provider]  metoprolol succinate (TOPROL-XL) 100 MG 24 hr tablet Take 100 mg by mouth daily. Take with or immediately following a meal.   Yes [provider]  oxyCODONE-acetaminophen (PERCOCET/ROXICET) 5-325 MG tablet Take 1 tablet by mouth every 4 (four) hours as needed for severe pain. 10/18/18  Yes Gery Pray, MD  pantoprazole (PROTONIX) 40 MG tablet Take 40 mg by mouth 2 (two) times daily. 05/17/18  Yes [provider]  ramipril (ALTACE) 10 MG capsule Take 10 mg by mouth daily. 04/21/18  Yes [provider]  sucralfate (CARAFATE) 1 GM/10ML suspension Take 10 mLs (1 g total) by mouth 3 (three) times daily as needed. Patient taking differently: Take 1 g by mouth 3 (three) times daily.  11/27/18  Yes Quintella Reichert, MD  TOUJEO SOLOSTAR 300 UNIT/ML SOPN Inject 18 Units into the skin every morning. 06/16/18  Yes [provider]  famotidine (PEPCID) 20 MG tablet Take 1 tablet (20 mg total) by mouth 2 (two) times daily. Patient not taking: Reported on 11/29/2018 08/23/12   Marny Lowenstein, PA-C  prochlorperazine (COMPAZINE) 10 MG tablet Take 1 tablet (10 mg total) by mouth every 6 (six) hours as needed for nausea or vomiting. Patient not taking: Reported on 08/30/2018 08/20/18   Curt Bears, MD  traMADol (ULTRAM) 50 MG tablet Take 1 tablet (50 mg total) by mouth every 6 (six) hours as needed. Patient not taking: Reported on 11/29/2018 07/05/18   Lajean Saver, MD     Allergies:    No Known Allergies   Physical Exam:   Vitals  Blood pressure 109/74, pulse 87, temperature 98.5 F (36.9 C), temperature source Oral, resp. rate 16, height 5' 9" (1.753 m), weight 80.3 kg, SpO2 98 %.  1.  General: aoxo3  2. Psychiatric: euthymic  3. Neurologic: cn2-12 intact, reflexes 2+ symmetric, diffuse with no clonus,  motor 5/5 in all 4 ext  4. HEENMT:  Anicteric, pupils 1.26m symmetric, direct, consensual, near intact Neck: no jvd, no bruit, no tm  5. Respiratory : CTAB  6. Cardiovascular : rrr s1, s2,   7. Gastrointestinal:  ABd: soft, nt, n d, +bs  8. Skin:  Ext: no  c/c/e,  No rash, No petechiae  9.Musculoskeletal:  Good ROM      Data Review:    CBC Recent Labs  Lab 11/24/18 1437 11/27/18 1602 11/28/18 2134  WBC 0.6* 0.9* 2.8*  HGB 11.1* 10.1* 9.2*  HCT 32.8* 29.5* 27.3*  PLT 64* 10* 8*  MCV 92.9 93.7 95.5  MCH 31.4 32.1 32.2  MCHC 33.8 34.2 33.7  RDW 13.8 12.9 13.3  LYMPHSABS 0.4* 0.2*  --   MONOABS 0.0* 0.5  --   EOSABS 0.1 0.0  --   BASOSABS 0.0 0.0  --    ------------------------------------------------------------------------------------------------------------------  Results for orders placed or performed during the hospital encounter of 11/28/18 (from the past 48 hour(s))  Basic metabolic panel     Status: Abnormal   Collection Time: 11/28/18  9:34 PM  Result Value Ref Range   Sodium 131 (L) 135 - 145 mmol/L   Potassium 3.4 (L) 3.5 - 5.1 mmol/L   Chloride 100 98 - 111 mmol/L   CO2 21 (L) 22 - 32 mmol/L   Glucose, Bld 142 (H) 70 - 99 mg/dL   BUN 23 8 - 23 mg/dL   Creatinine, Ser 1.38 (H) 0.61 - 1.24 mg/dL   Calcium 7.9 (L) 8.9 - 10.3 mg/dL   GFR calc non Af Amer 52 (L) >60 mL/min   GFR calc Af Amer >60 >60 mL/min   Anion gap 10 5 - 15    Comment: Performed at [facility], [place] [address], [place], [place] [address]  CBC     Status: Abnormal   Collection Time: 11/28/18  9:34 PM  Result Value Ref Range   WBC 2.8 (L) 4.0 - 10.5 K/uL   RBC 2.86 (L) 4.22 - 5.81 MIL/uL   Hemoglobin 9.2 (L) 13.0 - 17.0 g/dL   HCT 27.3 (L) 39.0 - 52.0 %   MCV 95.5 80.0 - 100.0 fL   MCH 32.2 26.0 - 34.0 pg   MCHC 33.7 30.0 - 36.0 g/dL   RDW 13.3 11.5 - 15.5 %   Platelets 8 (LL) 150 - 400 K/uL    Comment: REPEATED TO VERIFY PLATELET COUNT CONFIRMED BY  SMEAR SPECIMEN CHECKED FOR CLOTS Immature Platelet Fraction may be clinically indicated, consider ordering this additional test [id number]    nRBC 0.0 0.0 - 0.2 %    Comment: Performed at [facility], [place] [address] [place], [place] [address]  Hepatic function panel     Status: Abnormal   Collection Time: 11/28/18 10:14 PM  Result Value Ref Range   Total Protein 6.2 (L) 6.5 - 8.1 g/dL   Albumin 2.6 (L) 3.5 - 5.0 g/dL   AST 21 15 - 41 U/L   ALT 27 0 - 44 U/L   Alkaline Phosphatase 111 38 - 126 U/L   Total Bilirubin 1.1 0.3 - 1.2 mg/dL   Bilirubin, Direct 0.5 (H) 0.0 - 0.2 mg/dL   Indirect Bilirubin 0.6 0.3 - 0.9 mg/dL    Comment: Performed at [facility], [place] [address], [place], [place] [address]  Urinalysis, Routine w reflex microscopic     Status: None   Collection Time: 11/29/18  2:00 AM  Result Value Ref Range   Color, Urine YELLOW YELLOW   APPearance CLEAR CLEAR   Specific Gravity, Urine 1.014 1.005 - 1.030   pH 5.0 5.0 - 8.0   Glucose, UA NEGATIVE NEGATIVE mg/dL   Hgb urine dipstick NEGATIVE NEGATIVE   Bilirubin Urine NEGATIVE NEGATIVE  Ketones, ur NEGATIVE NEGATIVE mg/dL   Protein, ur NEGATIVE NEGATIVE mg/dL   Nitrite NEGATIVE NEGATIVE   Leukocytes,Ua NEGATIVE NEGATIVE    Comment: Performed at [facility] [address] [place], [place] [address]  Prepare Pheresed Platelets     Status: None (Preliminary result)   Collection Time: 11/29/18  2:54 AM  Result Value Ref Range   Unit Number [id number]    Blood Component Type PLTP LR2 PAS    Unit division 00    Status of Unit ISSUED    Transfusion Status      OK TO TRANSFUSE Performed at [facility] [address] [place], [place] [address]   SARS Coronavirus 2 (CEPHEID - Performed in [facility] hospital lab), Hosp Order     Status: None   Collection Time: 11/29/18  2:57 AM   Specimen: Nasopharyngeal Swab  Result Value Ref Range    SARS Coronavirus 2 NEGATIVE NEGATIVE    Comment: (NOTE) If result is NEGATIVE SARS-CoV-2 target nucleic acids are NOT DETECTED. The SARS-CoV-2 RNA is generally detectable in upper and lower  respiratory specimens during the acute phase of infection. The lowest  concentration of SARS-CoV-2 viral copies this assay can detect is 250  copies / mL. A negative result does not preclude SARS-CoV-2 infection  and should not be used as the sole basis for treatment or other  patient management decisions.  A negative result may occur with  improper specimen collection / handling, submission of specimen other  than nasopharyngeal swab, presence of viral mutation(s) within the  areas targeted by this assay, and inadequate number of viral copies  (<250 copies / mL). A negative result must be combined with clinical  observations, patient history, and epidemiological information. If result is POSITIVE SARS-CoV-2 target nucleic acids are DETECTED. The SARS-CoV-2 RNA is generally detectable in upper and lower  respiratory specimens dur ing the acute phase of infection.  Positive  results are indicative of active infection with SARS-CoV-2.  Clinical  correlation with patient history and other diagnostic information is  necessary to determine patient infection status.  Positive results do  not rule out bacterial infection or co-infection with other viruses. If result is PRESUMPTIVE POSTIVE SARS-CoV-2 nucleic acids MAY BE PRESENT.   A presumptive positive result was obtained on the submitted specimen  and confirmed on repeat testing.  While 2019 novel coronavirus  (SARS-CoV-2) nucleic acids may be present in the submitted sample  additional confirmatory testing may be necessary for epidemiological  and / or clinical management purposes  to differentiate between  SARS-CoV-2 and other Sarbecovirus currently known to infect humans.  If clinically indicated additional testing with an alternate test   methodology [id number]) is advised. The SARS-CoV-2 RNA is generally  detectable in upper and lower respiratory sp ecimens during the acute  phase of infection. The expected result is Negative. Fact Sheet for Patients:  StrictlyIdeas.no Fact Sheet for Healthcare Providers: BankingDealers.co.za This test is not yet approved or cleared by the [place] FDA and has been authorized for detection and/or diagnosis of SARS-CoV-2 by FDA under an Emergency Use Authorization (EUA).  This EUA will remain in effect (meaning this test can be used) for the duration of the COVID-19 declaration under Section 564(b)(1) of the Act, 21 U.S.C. section 360bbb-3(b)(1), unless the authorization is terminated or revoked sooner. Performed at [facility], [place] [address], [place], [place] [address]   Type and screen [facility]  Status: None   Collection Time: 11/29/18  3:40 AM  Result Value Ref Range   ABO/RH(D) A POS    Antibody Screen NEG    Sample Expiration      12/02/2018,2359 Performed at [facility], [place] [address] [place], [place] [address]     Chemistries  Recent Labs  Lab 11/24/18 [phone] 11/27/18 1602 11/28/18 2134 11/28/18 2214  NA 135 133* 131*  --   K 4.5 3.9 3.4*  --   CL 102 101 100  --   CO2 21* 21* 21*  --   GLUCOSE 138* 141* 142*  --   BUN _0 --   CREATININE 0.99 1.12 1.38*  --   CALCIUM 9.0 8.2* 7.9*  --   AST 54* 23  --  21  ALT 75* 36  --  27  ALKPHOS 165* 132*  --  111  BILITOT 1.1 1.4*  --  1.1   ------------------------------------------------------------------------------------------------------------------  ------------------------------------------------------------------------------------------------------------------ GFR: Estimated Creatinine Clearance: 51.2 mL/min (A) (by C-G formula based on SCr of 1.38 mg/dL (H)). Liver Function Tests: Recent  Labs  Lab 11/24/18 1437 11/27/18 1602 11/28/18 2214  AST 54* 23 21  ALT 75* 36 27  ALKPHOS 165* 132* 111  BILITOT 1.1 1.4* 1.1  PROT 6.8 6.2* 6.2*  ALBUMIN 3.0* 2.5* 2.6*   Recent Labs  Lab 11/27/18 1602  LIPASE 20   No results for input(s): AMMONIA in the last 168 hours. Coagulation Profile: No results for input(s): INR, PROTIME in the last 168 hours. Cardiac Enzymes: No results for input(s): CKTOTAL, CKMB, CKMBINDEX, TROPONINI in the last 168 hours. BNP (last 3 results) No results for input(s): PROBNP in the last 8760 hours. HbA1C: No results for input(s): HGBA1C in the last 72 hours. CBG: No results for input(s): GLUCAP in the last 168 hours. Lipid Profile: No results for input(s): CHOL, HDL, LDLCALC, TRIG, CHOLHDL, LDLDIRECT in the last 72 hours. Thyroid Function Tests: No results for input(s): TSH, T4TOTAL, FREET4, T3FREE, THYROIDAB in the last 72 hours. Anemia Panel: No results for input(s): VITAMINB12, FOLATE, FERRITIN, TIBC, IRON, RETICCTPCT in the last 72 hours.  --------------------------------------------------------------------------------------------------------------- Urine analysis:    Component Value Date/Time   COLORURINE YELLOW 11/29/2018 0200   APPEARANCEUR CLEAR 11/29/2018 0200   LABSPEC 1.014 11/29/2018 0200   PHURINE 5.0 11/29/2018 0200   GLUCOSEU NEGATIVE 11/29/2018 0200   HGBUR NEGATIVE 11/29/2018 0200   BILIRUBINUR NEGATIVE 11/29/2018 0200   KETONESUR NEGATIVE 11/29/2018 0200   PROTEINUR NEGATIVE 11/29/2018 0200   UROBILINOGEN 1.0 12/16/2008 0451   NITRITE NEGATIVE 11/29/2018 0200   LEUKOCYTESUR NEGATIVE 11/29/2018 0200      Imaging Results:    Dg Chest 2 View  Result Date: 11/27/2018 CLINICAL DATA:  Chest pain. History of lung cancer. EXAM: CHEST - 2 VIEW COMPARISON:  Chest x-rays dated 08/16/2018 and 08/10/2018 and chest CT dated 10/12/2018 FINDINGS: Power port in place with the tip at the cavoatrial junction. Heart size and  vascularity are normal. There is increased density in the posterior aspect of the right lower lobe in the area of the patient's known tumor. There is increased thickening along the fissures in the midzone at the right base laterally. The left lung is clear. No effusions. No bone abnormality. IMPRESSION: 1. Slight density in the posterior aspect of the right lower lobe in the area of the patient's known tumor. 2. Increased thickening of the fissures on the right. Electronically Signed   By: Lorriane Shire M.D.  On: 11/27/2018 15:49   ekg ordered by Wilson Singer but not visible in epic   Assessment & Plan:    Principal Problem:   Thrombocytopenia (HCC) Active Problems:   Tobacco abuse disorder   Alcohol abuse   Adenocarcinoma of right lung, stage 4 (HCC)   Hypokalemia   Hyponatremia  Pancytopenia likely secondary to chemo Hold aspirin, Transfuse 1 units of plt (this was ordered by ED) Check cbc at 10 am  Non - small cell lung cancer stage 4.  Unclear to me whether he is on oncology list to be seen in AM Please run this case by oncology in am regarding thrombcytopenia  Hypokalemia Replete Check cmp at 10 am  Hyponatremia Hydrate with ns iv Check cmp at 10 am  Hypertension Cont Norvasc 89m po qday Cont Toprol XL 1038mpo qday Cont Ramipril 1023mo qday  Dm2 Cont Metformin  Hold Lantus, not clear about how good his po intake is fsbs ac and qhs, ISS  Hyperlipidemia Cont Lipitor 89m29m qhs  Gerd Cont Sucralfate, cont PPI  Hypothyroidism Cont Levothyroxine 100 micrograms po qday  ? Neuropathy Cont Gabapentin  DVT Prophylaxis-   SCDs   AM Labs Ordered, also please review Full Orders  Family Communication: Admission, patients condition and plan of care including tests being ordered have been discussed with the patient  who indicate understanding and agree with the plan and Code Status.  Code Status:  FULL CODE,  [facility] for [facility]: Observation: Based on patients clinical presentation and evaluation of above clinical data, I have made determination that patient meets observation criteria at this time.  Time spent in minutes : 55    JameJani Gravel on 11/29/2018 at 6:03 AM

## 2018-11-30 LAB — BPAM PLATELET PHERESIS
Blood Product Expiration Date: 202007282359
ISSUE DATE / TIME: 202007270524
Unit Type and Rh: 600

## 2018-11-30 LAB — HIV ANTIBODY (ROUTINE TESTING W REFLEX): HIV Screen 4th Generation wRfx: NONREACTIVE

## 2018-11-30 LAB — PREPARE PLATELET PHERESIS: Unit division: 0

## 2018-12-01 ENCOUNTER — Inpatient Hospital Stay: Payer: Medicare Other

## 2018-12-01 ENCOUNTER — Other Ambulatory Visit: Payer: Self-pay

## 2018-12-01 DIAGNOSIS — C3491 Malignant neoplasm of unspecified part of right bronchus or lung: Secondary | ICD-10-CM

## 2018-12-01 DIAGNOSIS — Z5112 Encounter for antineoplastic immunotherapy: Secondary | ICD-10-CM | POA: Diagnosis not present

## 2018-12-01 DIAGNOSIS — Z95828 Presence of other vascular implants and grafts: Secondary | ICD-10-CM

## 2018-12-01 LAB — GLUCOSE, CAPILLARY
Glucose-Capillary: 97 mg/dL (ref 70–99)
Glucose-Capillary: 118 mg/dL — ABNORMAL HIGH (ref 70–99)

## 2018-12-01 LAB — CBC WITH DIFFERENTIAL (CANCER CENTER ONLY)
Abs Immature Granulocytes: 0.57 10*3/uL — ABNORMAL HIGH (ref 0.00–0.07)
Basophils Absolute: 0 10*3/uL (ref 0.0–0.1)
Basophils Relative: 1 %
Eosinophils Absolute: 0.1 10*3/uL (ref 0.0–0.5)
Eosinophils Relative: 1 %
HCT: 28.5 % — ABNORMAL LOW (ref 39.0–52.0)
Hemoglobin: 9.7 g/dL — ABNORMAL LOW (ref 13.0–17.0)
Immature Granulocytes: 10 %
Lymphocytes Relative: 9 %
Lymphs Abs: 0.5 10*3/uL — ABNORMAL LOW (ref 0.7–4.0)
MCH: 31.8 pg (ref 26.0–34.0)
MCHC: 34 g/dL (ref 30.0–36.0)
MCV: 93.4 fL (ref 80.0–100.0)
Monocytes Absolute: 1.1 10*3/uL — ABNORMAL HIGH (ref 0.1–1.0)
Monocytes Relative: 21 %
Neutro Abs: 3.2 10*3/uL (ref 1.7–7.7)
Neutrophils Relative %: 58 %
Platelet Count: 66 10*3/uL — ABNORMAL LOW (ref 150–400)
RBC: 3.05 MIL/uL — ABNORMAL LOW (ref 4.22–5.81)
RDW: 13.2 % (ref 11.5–15.5)
WBC Count: 5.5 10*3/uL (ref 4.0–10.5)
nRBC: 0.4 % — ABNORMAL HIGH (ref 0.0–0.2)

## 2018-12-01 LAB — CMP (CANCER CENTER ONLY)
ALT: 33 U/L (ref 0–44)
AST: 37 U/L (ref 15–41)
Albumin: 2.5 g/dL — ABNORMAL LOW (ref 3.5–5.0)
Alkaline Phosphatase: 152 U/L — ABNORMAL HIGH (ref 38–126)
Anion gap: 10 (ref 5–15)
BUN: 5 mg/dL — ABNORMAL LOW (ref 8–23)
CO2: 24 mmol/L (ref 22–32)
Calcium: 8.4 mg/dL — ABNORMAL LOW (ref 8.9–10.3)
Chloride: 101 mmol/L (ref 98–111)
Creatinine: 0.83 mg/dL (ref 0.61–1.24)
GFR, Est AFR Am: >60 mL/min
GFR, Estimated: >60 mL/min
Glucose, Bld: 146 mg/dL — ABNORMAL HIGH (ref 70–99)
Potassium: 3.1 mmol/L — ABNORMAL LOW (ref 3.5–5.1)
Sodium: 135 mmol/L (ref 135–145)
Total Bilirubin: 0.8 mg/dL (ref 0.3–1.2)
Total Protein: 6.2 g/dL — ABNORMAL LOW (ref 6.5–8.1)

## 2018-12-01 MED ORDER — SODIUM CHLORIDE 0.9% FLUSH
10.0000 mL | INTRAVENOUS | Status: DC | PRN
  Administered 2018-12-01: 15:00:00 10 mL
  Filled 2018-12-01: qty 10

## 2018-12-01 MED ORDER — HEPARIN SOD (PORK) LOCK FLUSH 100 UNIT/ML IV SOLN
500.0000 [IU] | Freq: Once | INTRAVENOUS | Status: AC | PRN
  Administered 2018-12-01: 15:00:00 500 [IU]
  Filled 2018-12-01: qty 5

## 2018-12-08 ENCOUNTER — Inpatient Hospital Stay: Payer: Medicare Other

## 2018-12-08 ENCOUNTER — Other Ambulatory Visit: Payer: Self-pay

## 2018-12-08 ENCOUNTER — Encounter: Payer: Self-pay | Admitting: Internal Medicine

## 2018-12-08 ENCOUNTER — Inpatient Hospital Stay: Payer: Medicare Other | Attending: Internal Medicine | Admitting: Internal Medicine

## 2018-12-08 VITALS — BP 134/98 | HR 75 | Temp 98.2°F | Resp 18

## 2018-12-08 DIAGNOSIS — C7951 Secondary malignant neoplasm of bone: Secondary | ICD-10-CM | POA: Diagnosis not present

## 2018-12-08 DIAGNOSIS — C3491 Malignant neoplasm of unspecified part of right bronchus or lung: Secondary | ICD-10-CM

## 2018-12-08 DIAGNOSIS — C3490 Malignant neoplasm of unspecified part of unspecified bronchus or lung: Secondary | ICD-10-CM | POA: Diagnosis not present

## 2018-12-08 DIAGNOSIS — Z5112 Encounter for antineoplastic immunotherapy: Secondary | ICD-10-CM

## 2018-12-08 DIAGNOSIS — Z5111 Encounter for antineoplastic chemotherapy: Secondary | ICD-10-CM

## 2018-12-08 DIAGNOSIS — A419 Sepsis, unspecified organism: Secondary | ICD-10-CM | POA: Diagnosis not present

## 2018-12-08 DIAGNOSIS — J189 Pneumonia, unspecified organism: Secondary | ICD-10-CM | POA: Diagnosis not present

## 2018-12-08 DIAGNOSIS — Z95828 Presence of other vascular implants and grafts: Secondary | ICD-10-CM

## 2018-12-08 LAB — CBC WITH DIFFERENTIAL (CANCER CENTER ONLY)
Abs Immature Granulocytes: 0.04 10*3/uL (ref 0.00–0.07)
Basophils Absolute: 0.1 10*3/uL (ref 0.0–0.1)
Basophils Relative: 1 %
Eosinophils Absolute: 0 10*3/uL (ref 0.0–0.5)
Eosinophils Relative: 0 %
HCT: 29 % — ABNORMAL LOW (ref 39.0–52.0)
Hemoglobin: 9.7 g/dL — ABNORMAL LOW (ref 13.0–17.0)
Immature Granulocytes: 1 %
Lymphocytes Relative: 11 %
Lymphs Abs: 0.5 10*3/uL — ABNORMAL LOW (ref 0.7–4.0)
MCH: 31.9 pg (ref 26.0–34.0)
MCHC: 33.4 g/dL (ref 30.0–36.0)
MCV: 95.4 fL (ref 80.0–100.0)
Monocytes Absolute: 0.8 10*3/uL (ref 0.1–1.0)
Monocytes Relative: 16 %
Neutro Abs: 3.5 10*3/uL (ref 1.7–7.7)
Neutrophils Relative %: 71 %
Platelet Count: 219 10*3/uL (ref 150–400)
RBC: 3.04 MIL/uL — ABNORMAL LOW (ref 4.22–5.81)
RDW: 14.5 % (ref 11.5–15.5)
WBC Count: 4.9 10*3/uL (ref 4.0–10.5)
nRBC: 0.4 % — ABNORMAL HIGH (ref 0.0–0.2)

## 2018-12-08 LAB — TSH: TSH: 1.901 u[IU]/mL (ref 0.320–4.118)

## 2018-12-08 LAB — CMP (CANCER CENTER ONLY)
ALT: 15 U/L (ref 0–44)
AST: 22 U/L (ref 15–41)
Albumin: 2.7 g/dL — ABNORMAL LOW (ref 3.5–5.0)
Alkaline Phosphatase: 110 U/L (ref 38–126)
Anion gap: 12 (ref 5–15)
BUN: 6 mg/dL — ABNORMAL LOW (ref 8–23)
CO2: 24 mmol/L (ref 22–32)
Calcium: 8.4 mg/dL — ABNORMAL LOW (ref 8.9–10.3)
Chloride: 103 mmol/L (ref 98–111)
Creatinine: 0.82 mg/dL (ref 0.61–1.24)
GFR, Est AFR Am: >60 mL/min (ref >60)
GFR, Estimated: >60 mL/min (ref >60)
Glucose, Bld: 108 mg/dL — ABNORMAL HIGH (ref 70–99)
Potassium: 3.2 mmol/L — ABNORMAL LOW (ref 3.5–5.1)
Sodium: 139 mmol/L (ref 135–145)
Total Bilirubin: 0.8 mg/dL (ref 0.3–1.2)
Total Protein: 6.3 g/dL — ABNORMAL LOW (ref 6.5–8.1)

## 2018-12-08 MED ORDER — SODIUM CHLORIDE 0.9 % IV SOLN
Freq: Once | INTRAVENOUS | Status: AC
  Administered 2018-12-08: 13:00:00 via INTRAVENOUS
  Filled 2018-12-08: qty 250

## 2018-12-08 MED ORDER — HEPARIN SOD (PORK) LOCK FLUSH 100 UNIT/ML IV SOLN
500.0000 [IU] | Freq: Once | INTRAVENOUS | Status: AC | PRN
  Administered 2018-12-08: 500 [IU]
  Filled 2018-12-08: qty 5

## 2018-12-08 MED ORDER — SODIUM CHLORIDE 0.9% FLUSH
10.0000 mL | INTRAVENOUS | Status: DC | PRN
  Administered 2018-12-08: 15:00:00 10 mL
  Filled 2018-12-08: qty 10

## 2018-12-08 MED ORDER — PALONOSETRON HCL INJECTION 0.25 MG/5ML
0.2500 mg | Freq: Once | INTRAVENOUS | Status: AC
  Administered 2018-12-08: 0.25 mg via INTRAVENOUS

## 2018-12-08 MED ORDER — SODIUM CHLORIDE 0.9 % IV SOLN
200.0000 mg | Freq: Once | INTRAVENOUS | Status: AC
  Administered 2018-12-08: 200 mg via INTRAVENOUS
  Filled 2018-12-08: qty 8

## 2018-12-08 MED ORDER — SODIUM CHLORIDE 0.9% FLUSH
10.0000 mL | INTRAVENOUS | Status: DC | PRN
  Administered 2018-12-08: 11:00:00 10 mL
  Filled 2018-12-08: qty 10

## 2018-12-08 MED ORDER — PALONOSETRON HCL INJECTION 0.25 MG/5ML
INTRAVENOUS | Status: AC
  Filled 2018-12-08: qty 5

## 2018-12-08 MED ORDER — CYANOCOBALAMIN 1000 MCG/ML IJ SOLN
1000.0000 ug | Freq: Once | INTRAMUSCULAR | Status: AC
  Administered 2018-12-08: 1000 ug via INTRAMUSCULAR

## 2018-12-08 MED ORDER — OXYCODONE-ACETAMINOPHEN 5-325 MG PO TABS: 1.0000 | ORAL_TABLET | ORAL | 0 refills | Status: DC | PRN

## 2018-12-08 MED ORDER — CYANOCOBALAMIN 1000 MCG/ML IJ SOLN
INTRAMUSCULAR | Status: AC
  Filled 2018-12-08: qty 1

## 2018-12-08 MED ORDER — SODIUM CHLORIDE 0.9 % IV SOLN
510.0000 mg/m2 | Freq: Once | INTRAVENOUS | Status: AC
  Administered 2018-12-08: 1000 mg via INTRAVENOUS
  Filled 2018-12-08: qty 40

## 2018-12-08 MED ORDER — SODIUM CHLORIDE 0.9 % IV SOLN
Freq: Once | INTRAVENOUS | Status: AC
  Administered 2018-12-08: 13:00:00 via INTRAVENOUS
  Filled 2018-12-08: qty 5

## 2018-12-08 MED ORDER — SODIUM CHLORIDE 0.9 % IV SOLN
415.6000 mg | Freq: Once | INTRAVENOUS | Status: AC
  Administered 2018-12-08: 420 mg via INTRAVENOUS
  Filled 2018-12-08: qty 42

## 2018-12-08 NOTE — Progress Notes (Signed)
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  Guardant 360 Molecular study: [id number] [id number] 0.3% Synonymous Alteration  PRIOR THERAPY: Palliative radiotherapy to the right eighth rib in addition to concurrent chemoradiation with weekly carboplatin for AUC of 2 and paclitaxel 45 mg/M2 to the lung mass and mediastinal lymphadenopathy for the locally advanced disease.  Status post 4 cycle of chemotherapy.  Last cycle was given on 09/13/2018.  CURRENT THERAPY: Systemic chemotherapy with carboplatin for AUC of 5, Alimta 500 mg/M2 and Keytruda 200 mg IV every 3 weeks.  First dose October 27, 2018.  Status post 2 cycles.   INTERVAL HISTORY: Zachary Knox 68 y.o. male returns to the clinic today for follow-up visit.  The patient is feeling fine today with no concerning complaints except for occasional pain on the right side of the chest.  He is requesting refill of his pain medication.  He tolerated the last cycle of his treatment fairly well except for significant thrombocytopenia with platelets count down to 8000.  He required platelet transfusion.  He is feeling much better today with no concerning bleeding issues.  He denied having any shortness of breath, cough or hemoptysis.  He has no nausea, vomiting, diarrhea or constipation.  He denied having any headache or visual changes.  The patient is here today for evaluation before starting cycle #3 of his treatment.   MEDICAL HISTORY: Past Medical History:  Diagnosis Date  . Cancer ([place]) 2020   lung  . Diabetes mellitus without complication ([place])   . Dyspnea    increased  exertion    ALLERGIES:  has No Known Allergies.  MEDICATIONS:  Current Outpatient Medications  Medication Sig Dispense Refill  . acetaminophen (TYLENOL) 500 MG tablet Take 1,000 mg by mouth every 6 (six) hours as needed for mild pain.     Marland Kitchen amLODipine (NORVASC) 5 MG tablet Take 5 mg by mouth daily.    Marland Kitchen aspirin EC 81 MG tablet Take 81 mg by mouth daily.     Marland Kitchen atorvastatin (LIPITOR) 20 MG tablet Take 20 mg by mouth daily.    . diphenhydrAMINE (BENADRYL) 25 MG tablet Take 1 tablet (25 mg total) by mouth every 6 (six) hours. 20 tablet 0  . folic acid (FOLVITE) 1 MG tablet Take 1 tablet (1 mg total) by mouth daily. 30 tablet 4  . gabapentin (NEURONTIN) 400 MG capsule Take 400 mg by mouth at bedtime.     Marland Kitchen ibuprofen (ADVIL,MOTRIN) 200 MG tablet Take 200 mg by mouth every 6 (six) hours as needed for moderate pain.    Marland Kitchen levothyroxine (SYNTHROID, LEVOTHROID) 100 MCG tablet Take 100 mcg by mouth daily.    Marland Kitchen lidocaine (XYLOCAINE) 2 % solution Use as directed 15 mLs in the mouth or throat every 6 (six) hours as needed (mouth/throat pain). Swish and swallow 100 mL 2  . metFORMIN (GLUCOPHAGE) 1000 MG tablet Take 500-1,000 mg by mouth See admin instructions. Take 1000 mg every morning and 500 mg at lunch and supper    . metoprolol succinate (TOPROL-XL) 100 MG 24 hr tablet Take 100 mg by mouth daily. Take with or immediately  following a meal.    . oxyCODONE-acetaminophen (PERCOCET/ROXICET) 5-325 MG tablet Take 1 tablet by mouth every 4 (four) hours as needed for severe pain. 30 tablet 0  . pantoprazole (PROTONIX) 40 MG tablet Take 40 mg by mouth 2 (two) times daily.    . prochlorperazine (COMPAZINE) 10 MG tablet Take 1 tablet (10 mg total) by mouth every 6 (six) hours as needed for nausea or vomiting. (Patient not taking: Reported on 08/30/2018) 30 tablet 1  . ramipril (ALTACE) 10 MG capsule Take 10 mg by mouth daily.    . sucralfate (CARAFATE) 1 GM/10ML suspension Take 10 mLs (1 g total) by mouth 3 (three)  times daily as needed. (Patient taking differently: Take 1 g by mouth 3 (three) times daily. ) 420 mL 0  . TOUJEO SOLOSTAR 300 UNIT/ML SOPN Inject 18 Units into the skin every morning.    . traMADol (ULTRAM) 50 MG tablet Take 1 tablet (50 mg total) by mouth every 6 (six) hours as needed. (Patient not taking: Reported on 11/29/2018) 20 tablet 0   No current facility-administered medications for this visit.     SURGICAL HISTORY:  Past Surgical History:  Procedure Laterality Date  . CARDIAC SURGERY     Heart attack in 2008 with stent placement  . IR IMAGING GUIDED PORT INSERTION  11/01/2018  . stent in heart      REVIEW OF SYSTEMS:  A comprehensive review of systems was negative except for: Constitutional: positive for fatigue Respiratory: positive for pleurisy/chest pain   PHYSICAL EXAMINATION: General appearance: alert, cooperative, fatigued and no distress Head: Normocephalic, without obvious abnormality, atraumatic Neck: no adenopathy, no JVD, supple, symmetrical, trachea midline and thyroid not enlarged, symmetric, no tenderness/mass/nodules Lymph nodes: Cervical, supraclavicular, and axillary nodes normal. Resp: clear to auscultation bilaterally Back: symmetric, no curvature. ROM normal. No CVA tenderness. Cardio: regular rate and rhythm, S1, S2 normal, no murmur, click, rub or gallop GI: soft, non-tender; bowel sounds normal; no masses,  no organomegaly Extremities: extremities normal, atraumatic, no cyanosis or edema  ECOG PERFORMANCE STATUS: 1 - Symptomatic but completely ambulatory  There were no vitals taken for this visit.  LABORATORY DATA: Lab Results  Component Value Date   WBC 4.9 12/08/2018   HGB 9.7 (L) 12/08/2018   HCT 29.0 (L) 12/08/2018   MCV 95.4 12/08/2018   PLT 219 12/08/2018      Chemistry      Component Value Date/Time   NA 139 12/08/2018 1109   K 3.2 (L) 12/08/2018 1109   CL 103 12/08/2018 1109   CO2 24 12/08/2018 1109   BUN 6 (L) 12/08/2018 1109    CREATININE 0.82 12/08/2018 1109      Component Value Date/Time   CALCIUM 8.4 (L) 12/08/2018 1109   ALKPHOS 110 12/08/2018 1109   AST 22 12/08/2018 1109   ALT 15 12/08/2018 1109   BILITOT 0.8 12/08/2018 1109       RADIOGRAPHIC STUDIES: Dg Chest 2 View  Result Date: 11/27/2018 CLINICAL DATA:  Chest pain. History of lung cancer. EXAM: CHEST - 2 VIEW COMPARISON:  Chest x-rays dated 08/16/2018 and 08/10/2018 and chest CT dated 10/12/2018 FINDINGS: Power port in place with the tip at the cavoatrial junction. Heart size and vascularity are normal. There is increased density in the posterior aspect of the right lower lobe in the area of the patient's known tumor. There is increased thickening along the fissures in the midzone at the right base laterally. The left lung is clear. No effusions.  No bone abnormality. IMPRESSION: 1. Slight density in the posterior aspect of the right lower lobe in the area of the patient's known tumor. 2. Increased thickening of the fissures on the right. Electronically Signed   By: Lorriane Shire M.D.   On: 11/27/2018 15:49    ASSESSMENT AND PLAN: This is a very pleasant 68 years old African-American male recently diagnosed with stage IV (T2b, N2, M1c) non-small cell lung cancer, adenocarcinoma diagnosed in April 2020 and presented with right lower lobe lung mass in addition to mediastinal lymphadenopathy and metastatic disease to the right eighth rib. He underwent a course of concurrent chemoradiation to the locally advanced disease in addition to palliative radiotherapy to the solitary right rib bone lesion. The patient tolerated his treatment well except for mild odynophagia. He is currently on systemic chemotherapy with carboplatin, Alimta and Keytruda status post 2 cycles.  The patient continues to tolerate this treatment well with no concerning adverse effects except for fatigue.. I recommended for the patient to proceed with cycle #3 today as planned. I will see  him back for follow-up visit in 3 weeks for evaluation with repeat CT scan of the chest, abdomen and pelvis for restaging of his disease. For the intermittent chest pain, I will give him a refill of oxycodone. He was advised to call immediately if he has any concerning symptoms in the interval. The patient voices understanding of current disease status and treatment options and is in agreement with the current care plan. All questions were answered. The patient knows to call the clinic with any problems, questions or concerns. We can certainly see the patient much sooner if necessary.  I spent 10 minutes counseling the patient face to face. The total time spent in the appointment was 15 minutes.  Disclaimer: This note was dictated with voice recognition software. Similar sounding words can inadvertently be transcribed and may not be corrected upon review.

## 2018-12-08 NOTE — Patient Instructions (Signed)
[facility] Discharge Instructions for Patients Receiving Chemotherapy  Today you received the following chemotherapy agents Keytruda, Alimta, and Carboplatin  To help prevent nausea and vomiting after your treatment, we encourage you to take your nausea medication as directed.  If you develop nausea and vomiting that is not controlled by your nausea medication, call the clinic.   BELOW ARE SYMPTOMS THAT SHOULD BE REPORTED IMMEDIATELY:  *FEVER GREATER THAN 100.5 F  *CHILLS WITH OR WITHOUT FEVER  NAUSEA AND VOMITING THAT IS NOT CONTROLLED WITH YOUR NAUSEA MEDICATION  *UNUSUAL SHORTNESS OF BREATH  *UNUSUAL BRUISING OR BLEEDING  TENDERNESS IN MOUTH AND THROAT WITH OR WITHOUT PRESENCE OF ULCERS  *URINARY PROBLEMS  *BOWEL PROBLEMS  UNUSUAL RASH Items with * indicate a potential emergency and should be followed up as soon as possible.  Feel free to call the clinic should you have any questions or concerns. The clinic phone number is [phone].  Please show the CHEMO ALERT CARD at check-in to the Emergency Department and triage nurse.   

## 2018-12-09 ENCOUNTER — Other Ambulatory Visit: Payer: Self-pay

## 2018-12-09 ENCOUNTER — Emergency Department (HOSPITAL_COMMUNITY): Payer: Medicare Other

## 2018-12-09 ENCOUNTER — Telehealth: Payer: Self-pay | Admitting: *Deleted

## 2018-12-09 ENCOUNTER — Inpatient Hospital Stay (HOSPITAL_COMMUNITY)
Admission: EM | Admit: 2018-12-09 | Discharge: 2018-12-13 | DRG: 871 | Disposition: A | Discharge status: Home / Self Care | Payer: Medicare Other | Attending: Student | Admitting: Student

## 2018-12-09 ENCOUNTER — Encounter (HOSPITAL_COMMUNITY): Payer: Self-pay

## 2018-12-09 DIAGNOSIS — Z781 Physical restraint status: Secondary | ICD-10-CM

## 2018-12-09 DIAGNOSIS — R509 Fever, unspecified: Secondary | ICD-10-CM | POA: Diagnosis not present

## 2018-12-09 DIAGNOSIS — G9340 Encephalopathy, unspecified: Secondary | ICD-10-CM | POA: Diagnosis not present

## 2018-12-09 DIAGNOSIS — C7951 Secondary malignant neoplasm of bone: Secondary | ICD-10-CM | POA: Diagnosis present

## 2018-12-09 DIAGNOSIS — E039 Hypothyroidism, unspecified: Secondary | ICD-10-CM | POA: Diagnosis present

## 2018-12-09 DIAGNOSIS — E785 Hyperlipidemia, unspecified: Secondary | ICD-10-CM | POA: Diagnosis present

## 2018-12-09 DIAGNOSIS — J189 Pneumonia, unspecified organism: Secondary | ICD-10-CM

## 2018-12-09 DIAGNOSIS — R000 Tachycardia, unspecified: Secondary | ICD-10-CM | POA: Diagnosis present

## 2018-12-09 DIAGNOSIS — E876 Hypokalemia: Secondary | ICD-10-CM | POA: Diagnosis present

## 2018-12-09 DIAGNOSIS — Z923 Personal history of irradiation: Secondary | ICD-10-CM | POA: Diagnosis not present

## 2018-12-09 DIAGNOSIS — E109 Type 1 diabetes mellitus without complications: Secondary | ICD-10-CM

## 2018-12-09 DIAGNOSIS — Z95828 Presence of other vascular implants and grafts: Secondary | ICD-10-CM | POA: Diagnosis not present

## 2018-12-09 DIAGNOSIS — Z794 Long term (current) use of insulin: Secondary | ICD-10-CM

## 2018-12-09 DIAGNOSIS — I10 Essential (primary) hypertension: Secondary | ICD-10-CM | POA: Diagnosis present

## 2018-12-09 DIAGNOSIS — E119 Type 2 diabetes mellitus without complications: Secondary | ICD-10-CM | POA: Diagnosis present

## 2018-12-09 DIAGNOSIS — Z87891 Personal history of nicotine dependence: Secondary | ICD-10-CM

## 2018-12-09 DIAGNOSIS — Y95 Nosocomial condition: Secondary | ICD-10-CM | POA: Diagnosis present

## 2018-12-09 DIAGNOSIS — E872 Acidosis: Secondary | ICD-10-CM | POA: Diagnosis present

## 2018-12-09 DIAGNOSIS — Z793 Long term (current) use of hormonal contraceptives: Secondary | ICD-10-CM

## 2018-12-09 DIAGNOSIS — Z20828 Contact with and (suspected) exposure to other viral communicable diseases: Secondary | ICD-10-CM | POA: Diagnosis present

## 2018-12-09 DIAGNOSIS — Z7984 Long term (current) use of oral hypoglycemic drugs: Secondary | ICD-10-CM

## 2018-12-09 DIAGNOSIS — G9341 Metabolic encephalopathy: Secondary | ICD-10-CM | POA: Insufficient documentation

## 2018-12-09 DIAGNOSIS — T451X5A Adverse effect of antineoplastic and immunosuppressive drugs, initial encounter: Secondary | ICD-10-CM | POA: Diagnosis present

## 2018-12-09 DIAGNOSIS — Z955 Presence of coronary angioplasty implant and graft: Secondary | ICD-10-CM

## 2018-12-09 DIAGNOSIS — Z9221 Personal history of antineoplastic chemotherapy: Secondary | ICD-10-CM | POA: Diagnosis not present

## 2018-12-09 DIAGNOSIS — A419 Sepsis, unspecified organism: Secondary | ICD-10-CM | POA: Diagnosis present

## 2018-12-09 DIAGNOSIS — C3491 Malignant neoplasm of unspecified part of right bronchus or lung: Secondary | ICD-10-CM | POA: Diagnosis present

## 2018-12-09 DIAGNOSIS — Z7982 Long term (current) use of aspirin: Secondary | ICD-10-CM

## 2018-12-09 DIAGNOSIS — J690 Pneumonitis due to inhalation of food and vomit: Secondary | ICD-10-CM | POA: Diagnosis present

## 2018-12-09 DIAGNOSIS — IMO0001 Reserved for inherently not codable concepts without codable children: Secondary | ICD-10-CM | POA: Insufficient documentation

## 2018-12-09 DIAGNOSIS — D638 Anemia in other chronic diseases classified elsewhere: Secondary | ICD-10-CM | POA: Diagnosis present

## 2018-12-09 DIAGNOSIS — D701 Agranulocytosis secondary to cancer chemotherapy: Secondary | ICD-10-CM | POA: Diagnosis present

## 2018-12-09 DIAGNOSIS — Z66 Do not resuscitate: Secondary | ICD-10-CM | POA: Diagnosis present

## 2018-12-09 DIAGNOSIS — L02214 Cutaneous abscess of groin: Secondary | ICD-10-CM | POA: Diagnosis present

## 2018-12-09 DIAGNOSIS — C3490 Malignant neoplasm of unspecified part of unspecified bronchus or lung: Secondary | ICD-10-CM | POA: Insufficient documentation

## 2018-12-09 DIAGNOSIS — S31109D Unspecified open wound of abdominal wall, unspecified quadrant without penetration into peritoneal cavity, subsequent encounter: Secondary | ICD-10-CM | POA: Diagnosis not present

## 2018-12-09 DIAGNOSIS — I252 Old myocardial infarction: Secondary | ICD-10-CM

## 2018-12-09 DIAGNOSIS — Z79899 Other long term (current) drug therapy: Secondary | ICD-10-CM

## 2018-12-09 HISTORY — DX: Essential (primary) hypertension: I10

## 2018-12-09 LAB — BLOOD GAS, ARTERIAL
Acid-Base Excess: 0.5 mmol/L (ref 0.0–2.0)
Bicarbonate: 23 mmol/L (ref 20.0–28.0)
Drawn by: 422461
FIO2: 21
O2 Saturation: 93.3 %
Patient temperature: 101.8
pCO2 arterial: 33.5 mmHg (ref 32.0–48.0)
pH, Arterial: 7.461 — ABNORMAL HIGH (ref 7.350–7.450)
pO2, Arterial: 73.1 mmHg — ABNORMAL LOW (ref 83.0–108.0)

## 2018-12-09 LAB — CBC WITH DIFFERENTIAL/PLATELET
Abs Immature Granulocytes: 0.04 10*3/uL (ref 0.00–0.07)
Basophils Absolute: 0.1 10*3/uL (ref 0.0–0.1)
Basophils Relative: 1 %
Eosinophils Absolute: 0 10*3/uL (ref 0.0–0.5)
Eosinophils Relative: 0 %
HCT: 31.6 % — ABNORMAL LOW (ref 39.0–52.0)
Hemoglobin: 10.1 g/dL — ABNORMAL LOW (ref 13.0–17.0)
Immature Granulocytes: 0 %
Lymphocytes Relative: 3 %
Lymphs Abs: 0.3 10*3/uL — ABNORMAL LOW (ref 0.7–4.0)
MCH: 31.6 pg (ref 26.0–34.0)
MCHC: 32 g/dL (ref 30.0–36.0)
MCV: 98.8 fL (ref 80.0–100.0)
Monocytes Absolute: 0.4 10*3/uL (ref 0.1–1.0)
Monocytes Relative: 5 %
Neutro Abs: 8.8 10*3/uL — ABNORMAL HIGH (ref 1.7–7.7)
Neutrophils Relative %: 91 %
Platelets: 238 10*3/uL (ref 150–400)
RBC: 3.2 MIL/uL — ABNORMAL LOW (ref 4.22–5.81)
RDW: 14.8 % (ref 11.5–15.5)
WBC: 9.6 10*3/uL (ref 4.0–10.5)
nRBC: 0.3 % — ABNORMAL HIGH (ref 0.0–0.2)

## 2018-12-09 LAB — URINALYSIS, ROUTINE W REFLEX MICROSCOPIC
Bacteria, UA: NONE SEEN
Glucose, UA: 150 mg/dL — AB
Hgb urine dipstick: NEGATIVE
Ketones, ur: NEGATIVE mg/dL
Leukocytes,Ua: NEGATIVE
Nitrite: NEGATIVE
Protein, ur: 30 mg/dL — AB
Specific Gravity, Urine: 1.02 (ref 1.005–1.030)
pH: 6 (ref 5.0–8.0)

## 2018-12-09 LAB — COMPREHENSIVE METABOLIC PANEL
ALT: 20 U/L (ref 0–44)
AST: 32 U/L (ref 15–41)
Albumin: 2.7 g/dL — ABNORMAL LOW (ref 3.5–5.0)
Alkaline Phosphatase: 106 U/L (ref 38–126)
Anion gap: 12 (ref 5–15)
BUN: 10 mg/dL (ref 8–23)
CO2: 24 mmol/L (ref 22–32)
Calcium: 8.2 mg/dL — ABNORMAL LOW (ref 8.9–10.3)
Chloride: 100 mmol/L (ref 98–111)
Creatinine, Ser: 1.08 mg/dL (ref 0.61–1.24)
GFR calc Af Amer: >60 mL/min (ref >60)
GFR calc non Af Amer: >60 mL/min (ref >60)
Glucose, Bld: 147 mg/dL — ABNORMAL HIGH (ref 70–99)
Potassium: 3.8 mmol/L (ref 3.5–5.1)
Sodium: 136 mmol/L (ref 135–145)
Total Bilirubin: 1.1 mg/dL (ref 0.3–1.2)
Total Protein: 6.5 g/dL (ref 6.5–8.1)

## 2018-12-09 LAB — LIPASE, BLOOD: Lipase: 21 U/L (ref 11–51)

## 2018-12-09 LAB — CREATININE, SERUM
Creatinine, Ser: 1 mg/dL (ref 0.61–1.24)
GFR calc Af Amer: >60 mL/min (ref >60)
GFR calc non Af Amer: >60 mL/min (ref >60)

## 2018-12-09 LAB — LACTIC ACID, PLASMA
Lactic Acid, Venous: 3.7 mmol/L (ref 0.5–1.9)
Lactic Acid, Venous: 2.9 mmol/L (ref 0.5–1.9)

## 2018-12-09 LAB — CBG MONITORING, ED: Glucose-Capillary: 140 mg/dL — ABNORMAL HIGH (ref 70–99)

## 2018-12-09 LAB — SARS CORONAVIRUS 2 BY RT PCR (HOSPITAL ORDER, PERFORMED IN ~~LOC~~ HOSPITAL LAB): SARS Coronavirus 2: NEGATIVE

## 2018-12-09 MED ORDER — SODIUM CHLORIDE 0.9 % IV SOLN
2.0000 g | Freq: Once | INTRAVENOUS | Status: AC
  Administered 2018-12-09: 2 g via INTRAVENOUS
  Filled 2018-12-09: qty 2

## 2018-12-09 MED ORDER — ACETAMINOPHEN 650 MG RE SUPP
325.0000 mg | RECTAL | Status: DC | PRN
  Administered 2018-12-09 – 2018-12-10 (×3): 325 mg via RECTAL
  Filled 2018-12-09 (×3): qty 1

## 2018-12-09 MED ORDER — SODIUM CHLORIDE 0.9 % IV BOLUS
500.0000 mL | Freq: Once | INTRAVENOUS | Status: AC
  Administered 2018-12-09: 17:00:00 500 mL via INTRAVENOUS

## 2018-12-09 MED ORDER — INSULIN ASPART 100 UNIT/ML ~~LOC~~ SOLN
0.0000 [IU] | SUBCUTANEOUS | Status: DC
  Administered 2018-12-09 – 2018-12-12 (×6): 1 [IU] via SUBCUTANEOUS
  Administered 2018-12-12: 22:00:00 2 [IU] via SUBCUTANEOUS
  Filled 2018-12-09: qty 0.09

## 2018-12-09 MED ORDER — METOPROLOL TARTRATE 5 MG/5ML IV SOLN
5.0000 mg | Freq: Three times a day (TID) | INTRAVENOUS | Status: DC
  Administered 2018-12-09 – 2018-12-11 (×5): 5 mg via INTRAVENOUS
  Filled 2018-12-09 (×5): qty 5

## 2018-12-09 MED ORDER — VANCOMYCIN HCL 10 G IV SOLR
1500.0000 mg | Freq: Once | INTRAVENOUS | Status: AC
  Administered 2018-12-09: 16:00:00 1500 mg via INTRAVENOUS
  Filled 2018-12-09: qty 1500

## 2018-12-09 MED ORDER — LORAZEPAM 2 MG/ML IJ SOLN
1.0000 mg | Freq: Once | INTRAMUSCULAR | Status: AC
  Administered 2018-12-10: 01:00:00 1 mg via INTRAVENOUS
  Filled 2018-12-09: qty 1

## 2018-12-09 MED ORDER — SODIUM CHLORIDE 0.9 % IV SOLN
2.0000 g | Freq: Three times a day (TID) | INTRAVENOUS | Status: DC
  Administered 2018-12-09 – 2018-12-11 (×5): 2 g via INTRAVENOUS
  Filled 2018-12-09 (×6): qty 2

## 2018-12-09 MED ORDER — ACETAMINOPHEN 650 MG RE SUPP
650.0000 mg | Freq: Once | RECTAL | Status: AC
  Administered 2018-12-09: 15:00:00 650 mg via RECTAL
  Filled 2018-12-09: qty 1

## 2018-12-09 MED ORDER — ENOXAPARIN SODIUM 40 MG/0.4ML ~~LOC~~ SOLN
40.0000 mg | SUBCUTANEOUS | Status: DC
  Administered 2018-12-09 – 2018-12-10 (×2): 40 mg via SUBCUTANEOUS
  Filled 2018-12-09 (×2): qty 0.4

## 2018-12-09 MED ORDER — VANCOMYCIN HCL 10 G IV SOLR
1500.0000 mg | INTRAVENOUS | Status: DC
  Administered 2018-12-10: 1500 mg via INTRAVENOUS
  Filled 2018-12-09: qty 1500

## 2018-12-09 MED ORDER — SODIUM CHLORIDE 0.9 % IV SOLN
INTRAVENOUS | Status: DC
  Administered 2018-12-09 – 2018-12-11 (×3): via INTRAVENOUS

## 2018-12-09 NOTE — Progress Notes (Signed)
A consult was received from an ED physician for vancomycin per pharmacy dosing (for an indication other than meningitis). The patient's profile has been reviewed for ht/wt/allergies/indication/available labs. A one time order has been placed for the above antibiotics.  Further antibiotics/pharmacy consults should be ordered by admitting physician if indicated.                       Reuel Boom, PharmD, BCPS [phone] 12/09/2018, 2:02 PM

## 2018-12-09 NOTE — ED Notes (Addendum)
Date and time results received: 12/09/18 6:35 PM    Test: lactic acid Critical Value: 2.9  Name of Provider Notified: hospitalist paged by ED secretary and Thedore Mins ED RN notified   Orders Received? Or Actions Taken?: awaiting call back

## 2018-12-09 NOTE — ED Triage Notes (Signed)
EMS reports from home, called for fever and increasing weakness since last Wednesday after chemo. CA Pt stage 4 lung cancer.   BP 112/70 HR 80 RR 16 Sp02 97 RA CBG 169 Temp 99.0

## 2018-12-09 NOTE — H&P (Addendum)
History and Physical  Zachary Knox KVQ:[id number] DOB: 10-Feb-1951 DOA: 12/09/2018  Referring physician: EDP PCP: Vincente Liberty, MD   Chief Complaint: fever , chills and weakness  HPI: Zachary Knox is a 68 y.o. male   H/o Hypertension, Hyperlipidemia, hypothyroid, IDDM2,nonsmall cell lung cancer stage 4 with bone mets (PDL-1 +, ALK, and EGFR -), diagnosed in 08/2018, s/p portacath placement 11/01/2018 , he received chemo on 8/5 then developed chills and confusion, reports feeling weak. He is sent to the ED By EMS.  ED course: fever 103, has sinus tachycardia with fever, no hypoxia, bp stable. He is slightly confused reports he received chemo two weeks ago.  CT head no acute findings, cxr with multiple changes, per EDP discussion with patient's primary oncologist who think the changes in cxr are mostly consistent with cancer and post XRT changes. EKG sinus rhythm, no acute st/t changes. CBC/CMP stable at baseline, patient has elevated lactic acid at 3.7, blood culture obtained in the ED , ua and SARScov 2 pending collection at the time of calling admission. Patient received one dose of cefepime, vanc x1 order, it does not appear has been given at time of calling admission.   Review of Systems:  Detail per HPI, Review of systems are otherwise negative  Past Medical History:  Diagnosis Date   Cancer ([place]) 2020   lung   Diabetes mellitus without complication ([place])    Dyspnea    increased exertion   Hypertension    Past Surgical History:  Procedure Laterality Date   CARDIAC SURGERY     Heart attack in 2008 with stent placement   IR IMAGING GUIDED PORT INSERTION  11/01/2018   stent in heart     Social History:  reports that he has quit smoking. His smoking use included cigarettes. He has never used smokeless tobacco. He reports previous alcohol use. He reports that he does not use drugs. Patient lives at home & is able to participate in activities of daily living  independently   No Known Allergies  History reviewed. No pertinent family history.    Prior to Admission medications   Medication Sig Start Date End Date Taking? Authorizing Provider  acetaminophen (TYLENOL) 500 MG tablet Take 1,000 mg by mouth every 6 (six) hours as needed for mild pain.    Yes [provider]  amLODipine (NORVASC) 5 MG tablet Take 5 mg by mouth daily. 05/30/18  Yes [provider]  aspirin EC 81 MG tablet Take 81 mg by mouth daily.    Yes [provider]  atorvastatin (LIPITOR) 20 MG tablet Take 20 mg by mouth daily.   Yes [provider]  diphenhydrAMINE (BENADRYL) 25 MG tablet Take 1 tablet (25 mg total) by mouth every 6 (six) hours. Patient taking differently: Take 25 mg by mouth every 6 (six) hours as needed for allergies.  08/23/12  Yes Marny Lowenstein, PA-C  folic acid (FOLVITE) 1 MG tablet Take 1 tablet (1 mg total) by mouth daily. 10/20/18  Yes Curt Bears, MD  gabapentin (NEURONTIN) 400 MG capsule Take 400 mg by mouth at bedtime.  08/17/18  Yes [provider]  ibuprofen (ADVIL,MOTRIN) 200 MG tablet Take 200 mg by mouth every 6 (six) hours as needed for moderate pain.   Yes [provider]  levothyroxine (SYNTHROID, LEVOTHROID) 100 MCG tablet Take 100 mcg by mouth daily. 06/26/18  Yes [provider]  metFORMIN (GLUCOPHAGE) 1000 MG tablet Take 500-1,000 mg by mouth See admin instructions.  Take 1000 mg every morning and 500 mg at lunch and supper   Yes [provider]  metoprolol succinate (TOPROL-XL) 100 MG 24 hr tablet Take 100 mg by mouth daily. Take with or immediately following a meal.   Yes [provider]  oxyCODONE-acetaminophen (PERCOCET/ROXICET) 5-325 MG tablet Take 1 tablet by mouth every 4 (four) hours as needed for severe pain. 12/08/18  Yes Curt Bears, MD  pantoprazole (PROTONIX) 40 MG tablet Take 40 mg by mouth 2 (two) times daily. 05/17/18  Yes [provider]    ramipril (ALTACE) 10 MG capsule Take 10 mg by mouth daily. 04/21/18  Yes [provider]  sucralfate (CARAFATE) 1 GM/10ML suspension Take 10 mLs (1 g total) by mouth 3 (three) times daily as needed. Patient taking differently: Take 1 g by mouth 3 (three) times daily.  11/27/18  Yes Quintella Reichert, MD  TOUJEO SOLOSTAR 300 UNIT/ML SOPN Inject 18 Units into the skin every morning. 06/16/18  Yes [provider]  lidocaine (XYLOCAINE) 2 % solution Use as directed 15 mLs in the mouth or throat every 6 (six) hours as needed (mouth/throat pain). Swish and swallow Patient not taking: Reported on 12/09/2018 09/22/18   Gery Pray, MD  prochlorperazine (COMPAZINE) 10 MG tablet Take 1 tablet (10 mg total) by mouth every 6 (six) hours as needed for nausea or vomiting. Patient not taking: Reported on 08/30/2018 08/20/18   Curt Bears, MD  traMADol (ULTRAM) 50 MG tablet Take 1 tablet (50 mg total) by mouth every 6 (six) hours as needed. Patient not taking: Reported on 11/29/2018 07/05/18   Lajean Saver, MD    Physical Exam: BP 131/89    Pulse (!) 109    Temp (!) 103 F (39.4 C) (Rectal)    Resp (!) 22    SpO2 95%    General:  Open eyes briefly , does not answer questions, does not follow commands, he screamed during in and out cath   Eyes: PERRL  ENT: unremarkable  Neck: supple, no JVD  Cardiovascular: RRR  Respiratory: CTABL  Abdomen: soft/NT/ND, positive bowel sounds  Skin: no rash, left groin open skin lesion with dry pustule material in the center, no active drainage, does not have significant erythema or edema around it  Musculoskeletal:  No edema  Psychiatric: calm/cooperative  Neurologic: very lethargic, open eyes briefly          Labs on Admission:  Basic Metabolic Panel: Recent Labs  Lab 12/08/18 1109 12/09/18 1009  NA 139 136  K 3.2* 3.8  CL 103 100  CO2 24 24  GLUCOSE 108* 147*  BUN 6* 10  CREATININE 0.82 1.08  CALCIUM 8.4* 8.2*   Liver Function  Tests: Recent Labs  Lab 12/08/18 1109 12/09/18 1009  AST 22 32  ALT 15 20  ALKPHOS 110 106  BILITOT 0.8 1.1  PROT 6.3* 6.5  ALBUMIN 2.7* 2.7*   Recent Labs  Lab 12/09/18 1009  LIPASE 21   No results for input(s): AMMONIA in the last 168 hours. CBC: Recent Labs  Lab 12/08/18 1109 12/09/18 1009  WBC 4.9 9.6  NEUTROABS 3.5 8.8*  HGB 9.7* 10.1*  HCT 29.0* 31.6*  MCV 95.4 98.8  PLT 219 238   Cardiac Enzymes: No results for input(s): CKTOTAL, CKMB, CKMBINDEX, TROPONINI in the last 168 hours.  BNP (last 3 results) No results for input(s): BNP in the last 8760 hours.  ProBNP (last 3 results) No results for input(s): PROBNP in the last 8760  hours.  CBG: No results for input(s): GLUCAP in the last 168 hours.  Radiological Exams on Admission: Dg Chest 2 View  Result Date: 12/09/2018 CLINICAL DATA:  History of lung cancer. Increased weakness and shortness of breath. EXAM: CHEST - 2 VIEW COMPARISON:  CT scan October 12, 2018.  Chest x-ray November 27, 2018. FINDINGS: No pneumothorax. The right Port-A-Cath is stable, terminating just in the right side of the atrium. The left lung remains clear. Opacity projected over the right hilum on the frontal view and located posteriorly on the lateral view is more prominent in the interval. There is also mild increased opacity more peripherally in the lateral right lung base. No other interval changes. IMPRESSION: 1. Opacity in the region of the patient's known malignancy is more prominent in the interval. While the finding may be at least partially due to difference in technique, disease worsening or a superimposed infiltrate are both possible. There is also increased opacity peripheral to the site of the patient's known malignancy in the lateral right lung base. The increased conspicuity could be due to lower lung volumes versus worsening infiltrate. Electronically Signed   By: Dorise Bullion III M.D   On: 12/09/2018 09:49   Ct Head Wo  Contrast  Result Date: 12/09/2018 CLINICAL DATA:  Altered level of consciousness.  Fever. EXAM: CT HEAD WITHOUT CONTRAST TECHNIQUE: Contiguous axial images were obtained from the base of the skull through the vertex without intravenous contrast. COMPARISON:  None. FINDINGS: Brain: The ventricles are normal in size and configuration for age. There is no intracranial mass, hemorrhage, extra-axial fluid collection, or midline shift. The brain parenchyma appears unremarkable. No acute infarct evident. Vascular: No hyperdense vessel. There are foci of vascular calcification in each carotid siphon. Skull: Bony calvarium appears intact. Sinuses/Orbits: Visualized paranasal sinuses are clear. Orbits appear symmetric bilaterally. Other: Mastoid air cells are clear. IMPRESSION: There are foci of arterial vascular calcification. Brain parenchyma appears unremarkable. No acute infarct. No evident mass or hemorrhage. Electronically Signed   By: Lowella Grip III M.D.   On: 12/09/2018 09:50      Assessment/Plan Present on Admission: **None**  Sepsis/acute metabolic encephalopathy presents on admission: -patient presents with fever 103, sinus tachycardia, lactic acidosis -ct head no acute findings -source of infection not clear, possible pneumonia, left groin cellulitis, ua /[id number] pending collection, blood culture in process, will order mrsa screening and procalcitonin -keep npo due to encephalopathy, continue ivf, abx ( vanc/cefepime) -admit to stepdown unit, may need mri brain if mental status does not improve with abx.   Addendum: SARS Cov2 screening negative, will get ABG to rule out co2 retention.   Insulin dependent dm2 Currently npo due to encephalopathy, hold long acting insulin, start on ssi   HTN: bp normal currently, will start iv lopressor with holding parameters,  hold norvasc, ramipril  Hypothyroidism: continue synthroid when able to take oral meds  Nonsmall cell lung cancer stage  4 with bone mets (PDL-1 +, ALK, and EGFR -), diagnosed in 08/2018, s/p portacath placement 11/01/2018 , last chemo/immunotherapy on 8/5  Oncology Dr Julien Nordmann aware of hospitalization.   DVT prophylaxis: lovenox  Consultants: oncology Dr Julien Nordmann aware of hospitalization  Code Status: DNR/DNI, confirmed with brother at bedside and wife/daughter over the phone  Family Communication:  Patient and family  Disposition Plan: admit to stepdown  Time spent: 71mns  FFlorencia ReasonsMD, PhD, FACP [facility] Pager [phone] 7PM-7AM, please contact night-coverage at [email or web address], password [email or web address]

## 2018-12-09 NOTE — ED Provider Notes (Signed)
[facility] DEPT Provider Note   CSN: [id number] Arrival date & time: 12/09/18  1749     History   Chief Complaint Chief Complaint  Patient presents with  . Fever  . Fatigue    HPI Zachary Knox is a 68 y.o. male.       Zachary Knox is followed by Dr. Earlie Server from the cancer center.  He has stage IV non-small cell lung cancer.  Diagnosed in April 2020.  Presented with a right lower lobe lung mass.  He has been undergoing chemotherapy is also had radiation treatment.  Last treatments were yesterday and he was seen in the office by Dr. Earlie Server yesterday.  Last evening he started not feel well.  Fatigue fever chills.  No complaint of respiratory symptoms.  Upon arrival here today does seem to have some confusion.  Because he does not remember having his treatments yesterday.  Patient was brought in by EMS.  Patient had lab work yesterday prior to having his chemotherapy.     Past Medical History:  Diagnosis Date  . Cancer ([place]) 2020   lung  . Diabetes mellitus without complication ([place])   . Dyspnea    increased exertion  . Hypertension     Patient Active Problem List   Diagnosis Date Noted  . Thrombocytopenia ([place]) 11/29/2018  . Hypokalemia 11/29/2018  . Hyponatremia 11/29/2018  . Port-A-Cath in place 11/17/2018  . Encounter for antineoplastic immunotherapy 10/20/2018  . Adenocarcinoma of right lung, stage 4 ([place]) 08/19/2018  . Encounter for antineoplastic chemotherapy 08/19/2018  . Goals of care, counseling/discussion 08/19/2018  . Osseous metastasis ([place]) 07/26/2018  . 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 Tobacco abuse disorder 07/12/2018  . Alcohol abuse 07/12/2018    Past Surgical History:  Procedure Laterality Date  . CARDIAC SURGERY     Heart attack in 2008 with stent placement  . IR IMAGING GUIDED PORT INSERTION  11/01/2018  . stent in heart          Home Medications    Prior to Admission medications   Medication Sig  Start Date End Date Taking? Authorizing Provider  acetaminophen (TYLENOL) 500 MG tablet Take 1,000 mg by mouth every 6 (six) hours as needed for mild pain.    Yes [provider]  amLODipine (NORVASC) 5 MG tablet Take 5 mg by mouth daily. 05/30/18  Yes [provider]  aspirin EC 81 MG tablet Take 81 mg by mouth daily.    Yes [provider]  atorvastatin (LIPITOR) 20 MG tablet Take 20 mg by mouth daily.   Yes [provider]  diphenhydrAMINE (BENADRYL) 25 MG tablet Take 1 tablet (25 mg total) by mouth every 6 (six) hours. Patient taking differently: Take 25 mg by mouth every 6 (six) hours as needed for allergies.  08/23/12  Yes Marny Lowenstein, PA-C  folic acid (FOLVITE) 1 MG tablet Take 1 tablet (1 mg total) by mouth daily. 10/20/18  Yes Curt Bears, MD  gabapentin (NEURONTIN) 400 MG capsule Take 400 mg by mouth at bedtime.  08/17/18  Yes [provider]  ibuprofen (ADVIL,MOTRIN) 200 MG tablet Take 200 mg by mouth every 6 (six) hours as needed for moderate pain.   Yes [provider]  levothyroxine (SYNTHROID, LEVOTHROID) 100 MCG tablet Take 100 mcg by mouth daily. 06/26/18  Yes [provider]  metFORMIN (GLUCOPHAGE) 1000 MG tablet Take 500-1,000 mg by mouth See admin instructions. Take 1000 mg every morning and 500 mg  at lunch and supper   Yes [provider]  metoprolol succinate (TOPROL-XL) 100 MG 24 hr tablet Take 100 mg by mouth daily. Take with or immediately following a meal.   Yes [provider]  oxyCODONE-acetaminophen (PERCOCET/ROXICET) 5-325 MG tablet Take 1 tablet by mouth every 4 (four) hours as needed for severe pain. 12/08/18  Yes Curt Bears, MD  pantoprazole (PROTONIX) 40 MG tablet Take 40 mg by mouth 2 (two) times daily. 05/17/18  Yes [provider]  ramipril (ALTACE) 10 MG capsule Take 10 mg by mouth daily. 04/21/18  Yes [provider]  sucralfate (CARAFATE) 1 GM/10ML  suspension Take 10 mLs (1 g total) by mouth 3 (three) times daily as needed. Patient taking differently: Take 1 g by mouth 3 (three) times daily.  11/27/18  Yes Quintella Reichert, MD  TOUJEO SOLOSTAR 300 UNIT/ML SOPN Inject 18 Units into the skin every morning. 06/16/18  Yes [provider]  lidocaine (XYLOCAINE) 2 % solution Use as directed 15 mLs in the mouth or throat every 6 (six) hours as needed (mouth/throat pain). Swish and swallow Patient not taking: Reported on 12/09/2018 09/22/18   Gery Pray, MD  prochlorperazine (COMPAZINE) 10 MG tablet Take 1 tablet (10 mg total) by mouth every 6 (six) hours as needed for nausea or vomiting. Patient not taking: Reported on 08/30/2018 08/20/18   Curt Bears, MD  traMADol (ULTRAM) 50 MG tablet Take 1 tablet (50 mg total) by mouth every 6 (six) hours as needed. Patient not taking: Reported on 11/29/2018 07/05/18   Lajean Saver, MD    Family History History reviewed. No pertinent family history.  Social History Social History   Tobacco Use  . Smoking status: Former Smoker    Types: Cigarettes  . Smokeless tobacco: Never Used  Substance Use Topics  . Alcohol use: Not Currently    Comment: occassional   . Drug use: Never     Allergies   Patient has no known allergies.   Review of Systems Review of Systems  Constitutional: Positive for chills, fatigue and fever.  HENT: Negative for congestion, rhinorrhea and sore throat.   Eyes: Negative for visual disturbance.  Respiratory: Negative for cough and shortness of breath.   Cardiovascular: Negative for chest pain and leg swelling.  Gastrointestinal: Negative for abdominal pain, diarrhea, nausea and vomiting.  Genitourinary: Negative for dysuria.  Musculoskeletal: Negative for back pain and neck pain.  Skin: Negative for rash.  Neurological: Negative for dizziness, light-headedness and headaches.  Hematological: Does not bruise/bleed easily.  Psychiatric/Behavioral: Positive for  confusion.     Physical Exam Updated Vital Signs BP 131/89   Pulse (!) 109   Temp (!) 103 F (39.4 C) (Rectal)   Resp (!) 22   SpO2 95%   Physical Exam Vitals signs and nursing note reviewed.  Constitutional:      Appearance: He is well-developed.  HENT:     Head: Normocephalic and atraumatic.  Eyes:     Extraocular Movements: Extraocular movements intact.     Conjunctiva/sclera: Conjunctivae normal.     Pupils: Pupils are equal, round, and reactive to light.  Neck:     Musculoskeletal: Normal range of motion and neck supple.  Cardiovascular:     Rate and Rhythm: Normal rate and regular rhythm.     Heart sounds: No murmur.  Pulmonary:     Effort: Pulmonary effort is normal. No respiratory distress.     Breath sounds: Normal breath sounds.  Abdominal:  Palpations: Abdomen is soft.     Tenderness: There is no abdominal tenderness.  Genitourinary:    Comments: Left groin with about a 1 cm exudative lesion.  No surrounding erythema no evidence of any deep abscess.  No swelling to the scrotum. Musculoskeletal:     Right lower leg: No edema.     Left lower leg: No edema.  Skin:    General: Skin is warm and dry.  Neurological:     Mental Status: He is alert.     Comments: Patient sleepy but will wake up and will communicate.      ED Treatments / Results  Labs (all labs ordered are listed, but only abnormal results are displayed) Labs Reviewed  LACTIC ACID, PLASMA - Abnormal; Notable for the following components:      Result Value   Lactic Acid, Venous 3.7 (*)    All other components within normal limits  CBC WITH DIFFERENTIAL/PLATELET - Abnormal; Notable for the following components:   RBC 3.20 (*)    Hemoglobin 10.1 (*)    HCT 31.6 (*)    nRBC 0.3 (*)    Neutro Abs 8.8 (*)    Lymphs Abs 0.3 (*)    All other components within normal limits  COMPREHENSIVE METABOLIC PANEL - Abnormal; Notable for the following components:   Glucose, Bld 147 (*)    Calcium  8.2 (*)    Albumin 2.7 (*)    All other components within normal limits  CULTURE, BLOOD (ROUTINE X 2)  CULTURE, BLOOD (ROUTINE X 2)  SARS CORONAVIRUS 2 (HOSPITAL ORDER, [facility])  LIPASE, BLOOD  LACTIC ACID, PLASMA    EKG EKG Interpretation  Date/Time:  Thursday December 09 2018 08:35:22 EDT Ventricular Rate:  96 PR Interval:    QRS Duration: 100 QT Interval:  356 QTC Calculation: 450 R Axis:   -29 Text Interpretation:  Sinus rhythm Borderline left axis deviation Low voltage, precordial leads Anteroseptal infarct, old Baseline wander in lead(s) V3 V4 V5 V6 Interpretation limited secondary to artifact Confirmed by Fredia Sorrow [id number]) on 12/09/2018 9:39:54 AM   Radiology Dg Chest 2 View  Result Date: 12/09/2018 CLINICAL DATA:  History of lung cancer. Increased weakness and shortness of breath. EXAM: CHEST - 2 VIEW COMPARISON:  CT scan October 12, 2018.  Chest x-ray November 27, 2018. FINDINGS: No pneumothorax. The right Port-A-Cath is stable, terminating just in the right side of the atrium. The left lung remains clear. Opacity projected over the right hilum on the frontal view and located posteriorly on the lateral view is more prominent in the interval. There is also mild increased opacity more peripherally in the lateral right lung base. No other interval changes. IMPRESSION: 1. Opacity in the region of the patient's known malignancy is more prominent in the interval. While the finding may be at least partially due to difference in technique, disease worsening or a superimposed infiltrate are both possible. There is also increased opacity peripheral to the site of the patient's known malignancy in the lateral right lung base. The increased conspicuity could be due to lower lung volumes versus worsening infiltrate. Electronically Signed   By: Dorise Bullion III M.D   On: 12/09/2018 09:49   Ct Head Wo Contrast  Result Date: 12/09/2018 CLINICAL DATA:  Altered level of  consciousness.  Fever. EXAM: CT HEAD WITHOUT CONTRAST TECHNIQUE: Contiguous axial images were obtained from the base of the skull through the vertex without intravenous contrast. COMPARISON:  None.  FINDINGS: Brain: The ventricles are normal in size and configuration for age. There is no intracranial mass, hemorrhage, extra-axial fluid collection, or midline shift. The brain parenchyma appears unremarkable. No acute infarct evident. Vascular: No hyperdense vessel. There are foci of vascular calcification in each carotid siphon. Skull: Bony calvarium appears intact. Sinuses/Orbits: Visualized paranasal sinuses are clear. Orbits appear symmetric bilaterally. Other: Mastoid air cells are clear. IMPRESSION: There are foci of arterial vascular calcification. Brain parenchyma appears unremarkable. No acute infarct. No evident mass or hemorrhage. Electronically Signed   By: Lowella Grip III M.D.   On: 12/09/2018 09:50    Procedures Procedures (including critical care time)  CRITICAL CARE Performed by: Fredia Sorrow Total critical care time: 30 minutes Critical care time was exclusive of separately billable procedures and treating other patients. Critical care was necessary to treat or prevent imminent or life-threatening deterioration. Critical care was time spent personally by me on the following activities: development of treatment plan with patient and/or surrogate as well as nursing, discussions with consultants, evaluation of patient's response to treatment, examination of patient, obtaining history from patient or surrogate, ordering and performing treatments and interventions, ordering and review of laboratory studies, ordering and review of radiographic studies, pulse oximetry and re-evaluation of patient's condition.   Medications Ordered in ED Medications  0.9 %  sodium chloride infusion ( Intravenous New Bag/Given 12/09/18 1436)  vancomycin (VANCOCIN) 1,500 mg in sodium chloride 0.9 % 500 mL  IVPB (has no administration in time range)  sodium chloride 0.9 % bolus 500 mL (has no administration in time range)  ceFEPIme (MAXIPIME) 2 g in sodium chloride 0.9 % 100 mL IVPB (2 g Intravenous New Bag/Given 12/09/18 1438)  acetaminophen (TYLENOL) suppository 650 mg (650 mg Rectal Given 12/09/18 1452)     Initial Impression / Assessment and Plan / ED Course  I have reviewed the triage vital signs and the nursing notes.  Pertinent labs & imaging results that were available during my care of the patient were reviewed by me and considered in my medical decision making (see chart for details).        Possibly could have early sepsis picture.  Blood cultures done patient started on H CAP antibiotics.  Did not meet threshold for 30 cc/kg fluid challenge.  COVID testing rapid test is pending.  Symptoms could be consistent with COVID-19 infection.  But not classic.  Discussed with the hospitalist.  Also had discussed with Dr. Earlie Server.  Possible H CAP pneumonia.  Also will cath for urinalysis.  But patient's lactic acid was in the upper threes.  Repeat if it goes over the for threshold patient will require additional fluid challenge.  Patient's chest x-ray raise some concerns for pneumonia but Dr. Earlie Server thought may be radiation treatment changes.  But with a spike in fever.  The concerns are for possible infection for sure.  Patient was treated with broad-spectrum antibiotics.  For H CAP pneumonia.  Patient's complaint starting last evening was fever chills and fatigue.  Patient was confused here today.  When the temp went up to 103 patient became a little more confused.  We did have head CT without any acute findings.  Patient known to have non-small cell lung cancer.  Just had chemo treatment yesterday.  Just saw Dr. Earlie Server yesterday.  Final Clinical Impressions(s) / ED Diagnoses   Final diagnoses:  Fever, unspecified fever cause  HCAP (healthcare-associated pneumonia)    ED Discharge  Orders    None  Fredia Sorrow, MD 12/09/18 [phone]

## 2018-12-09 NOTE — Telephone Encounter (Signed)
Received message from [facility] on call service. Pt called and reported fever of 101.8. He was advised to go to ED. Pt arrived in ED at approx 8:30 this am.  Dr. Julien Nordmann made aware.

## 2018-12-09 NOTE — ED Notes (Signed)
Pt is trying to exit the bed. Pt able to follow basic commands but unable to answer questions. PT can be redirected for a short time to lay back down but continues to try and sit up after.

## 2018-12-09 NOTE — Progress Notes (Signed)
Pharmacy Antibiotic Note  Zachary Knox is a 68 y.o. male admitted on 12/09/2018 with stage IV non-small cell lung cancer undergoing chemo presents with sepsis.  Pharmacy has been consulted for vanc and cefepime dosing.  Plan: Vancomycin 1500mg  IV x 1 in ED then continue 1500 mg Q24h (AUC 441.7, Scr 1.08) Cefepime 2gm IV q8h Follow renal function, cultures and clinical course     Temp (24hrs), Avg:101.5 F (38.6 C), Min:99.1 F (37.3 C), Max:103 F (39.4 C)  Recent Labs  Lab 12/08/18 1109 12/09/18 1009 12/09/18 1752  WBC 4.9 9.6  --   CREATININE 0.82 1.08  --   LATICACIDVEN  --  3.7* 2.9*    Estimated Creatinine Clearance: 65.5 mL/min (by C-G formula based on SCr of 1.08 mg/dL).    No Known Allergies  Antimicrobials this admission: 8/6 vanc >> 8/6 cefepime >> Dose adjustments this admission:   Microbiology results: 8/6 BCx:  8/6 UCx:    Thank you for allowing pharmacy to be a part of this patient's care.  Dolly Rias RPh 12/09/2018, 7:03 PM Pager [phone]

## 2018-12-10 DIAGNOSIS — S31109D Unspecified open wound of abdominal wall, unspecified quadrant without penetration into peritoneal cavity, subsequent encounter: Secondary | ICD-10-CM

## 2018-12-10 DIAGNOSIS — J189 Pneumonia, unspecified organism: Secondary | ICD-10-CM

## 2018-12-10 DIAGNOSIS — E039 Hypothyroidism, unspecified: Secondary | ICD-10-CM

## 2018-12-10 DIAGNOSIS — R509 Fever, unspecified: Secondary | ICD-10-CM

## 2018-12-10 DIAGNOSIS — D638 Anemia in other chronic diseases classified elsewhere: Secondary | ICD-10-CM

## 2018-12-10 LAB — GLUCOSE, CAPILLARY
Glucose-Capillary: 105 mg/dL — ABNORMAL HIGH (ref 70–99)
Glucose-Capillary: 112 mg/dL — ABNORMAL HIGH (ref 70–99)
Glucose-Capillary: 121 mg/dL — ABNORMAL HIGH (ref 70–99)
Glucose-Capillary: 113 mg/dL — ABNORMAL HIGH (ref 70–99)
Glucose-Capillary: 106 mg/dL — ABNORMAL HIGH (ref 70–99)

## 2018-12-10 LAB — CBC WITH DIFFERENTIAL/PLATELET
Abs Immature Granulocytes: 0.1 10*3/uL — ABNORMAL HIGH (ref 0.00–0.07)
Basophils Absolute: 0 10*3/uL (ref 0.0–0.1)
Basophils Relative: 1 %
Eosinophils Absolute: 0 10*3/uL (ref 0.0–0.5)
Eosinophils Relative: 0 %
HCT: 31.1 % — ABNORMAL LOW (ref 39.0–52.0)
Hemoglobin: 10.1 g/dL — ABNORMAL LOW (ref 13.0–17.0)
Immature Granulocytes: 1 %
Lymphocytes Relative: 6 %
Lymphs Abs: 0.5 10*3/uL — ABNORMAL LOW (ref 0.7–4.0)
MCH: 31.6 pg (ref 26.0–34.0)
MCHC: 32.5 g/dL (ref 30.0–36.0)
MCV: 97.2 fL (ref 80.0–100.0)
Monocytes Absolute: 0.7 10*3/uL (ref 0.1–1.0)
Monocytes Relative: 8 %
Neutro Abs: 7 10*3/uL (ref 1.7–7.7)
Neutrophils Relative %: 84 %
Platelets: 165 10*3/uL (ref 150–400)
RBC: 3.2 MIL/uL — ABNORMAL LOW (ref 4.22–5.81)
RDW: 15 % (ref 11.5–15.5)
WBC: 8.4 10*3/uL (ref 4.0–10.5)
nRBC: 0 % (ref 0.0–0.2)

## 2018-12-10 LAB — BASIC METABOLIC PANEL
Anion gap: 13 (ref 5–15)
BUN: 11 mg/dL (ref 8–23)
CO2: 22 mmol/L (ref 22–32)
Calcium: 8 mg/dL — ABNORMAL LOW (ref 8.9–10.3)
Chloride: 103 mmol/L (ref 98–111)
Creatinine, Ser: 1 mg/dL (ref 0.61–1.24)
GFR calc Af Amer: >60 mL/min (ref >60)
GFR calc non Af Amer: >60 mL/min (ref >60)
Glucose, Bld: 115 mg/dL — ABNORMAL HIGH (ref 70–99)
Potassium: 3.5 mmol/L (ref 3.5–5.1)
Sodium: 138 mmol/L (ref 135–145)

## 2018-12-10 LAB — CBG MONITORING, ED
Glucose-Capillary: 113 mg/dL — ABNORMAL HIGH (ref 70–99)
Glucose-Capillary: 139 mg/dL — ABNORMAL HIGH (ref 70–99)

## 2018-12-10 LAB — MAGNESIUM: Magnesium: 1.3 mg/dL — ABNORMAL LOW (ref 1.7–2.4)

## 2018-12-10 LAB — HEMOGLOBIN A1C
Hgb A1c MFr Bld: 7 % — ABNORMAL HIGH (ref 4.8–5.6)
Mean Plasma Glucose: 154.2 mg/dL

## 2018-12-10 LAB — URINE CULTURE: Culture: NO GROWTH

## 2018-12-10 LAB — VITAMIN B12: Vitamin B-12: 2861 pg/mL — ABNORMAL HIGH (ref 180–914)

## 2018-12-10 LAB — MRSA PCR SCREENING: MRSA by PCR: NEGATIVE

## 2018-12-10 LAB — AMMONIA: Ammonia: 26 umol/L (ref 9–35)

## 2018-12-10 LAB — LACTIC ACID, PLASMA: Lactic Acid, Venous: 1.9 mmol/L (ref 0.5–1.9)

## 2018-12-10 MED ORDER — MAGNESIUM SULFATE 2 GM/50ML IV SOLN
2.0000 g | Freq: Once | INTRAVENOUS | Status: AC
  Administered 2018-12-10: 08:00:00 2 g via INTRAVENOUS
  Filled 2018-12-10: qty 50

## 2018-12-10 MED ORDER — ORAL CARE MOUTH RINSE
15.0000 mL | Freq: Two times a day (BID) | OROMUCOSAL | Status: DC
  Administered 2018-12-10 – 2018-12-12 (×6): 15 mL via OROMUCOSAL

## 2018-12-10 MED ORDER — CHLORHEXIDINE GLUCONATE CLOTH 2 % EX PADS
6.0000 | MEDICATED_PAD | Freq: Every day | CUTANEOUS | Status: DC
  Administered 2018-12-11 – 2018-12-12 (×2): 6 via TOPICAL

## 2018-12-10 MED ORDER — LEVOTHYROXINE SODIUM 100 MCG/5ML IV SOLN
50.0000 ug | Freq: Every day | INTRAVENOUS | Status: DC
  Administered 2018-12-10 – 2018-12-11 (×2): 50 ug via INTRAVENOUS
  Filled 2018-12-10 (×2): qty 5

## 2018-12-10 NOTE — Progress Notes (Signed)
Radiology called and stated that the critical care team must attempt the lumbar puncture first before radiology will attempt. Also, patient has received lovenox at 2200 on 12/09/2018 so the procedure can not be performed until 2200 on 12/10/2018. The flouroscopy/radiology team is on campus until 1pm on 12/11/2018 so the attempt by the critical care team needs to happen in the morning so the radiology team can plan accordingly. Dr. Cyndia Skeeters notified of this information.

## 2018-12-10 NOTE — Progress Notes (Signed)
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  Found to have undifferentiated sepsis with fever to 103, tachycardia, AMS and lactic acidosis and admitted on 12/09/2018.  Started on broad-spectrum antibiotic with vancomycin and cefepime.   Subjective: Patient remains encephalopathic.  Briefly wakes up to voice and goes back to sleep.  Not able to hold conversation.  Still with bilateral wrist and soft waist belt restraints and mittens.  Does not appear to be in distress.  Objective: Vitals:   12/10/18 0800 12/10/18 1000 12/10/18 1100 12/10/18 1200  BP:  135/90 131/86 135/87  Pulse: (!) 102 88 84 80  Resp: (!) 26 (!) '22 16 15  ' Temp: (!) 103.2 F (39.6 C) 99.1 F (37.3 C)    TempSrc: Oral Oral    SpO2: 100% 98% 97% 97%  Weight:      Height:        Intake/Output Summary (Last 24 hours) at 12/10/2018 1208 Last data filed at 12/10/2018 8119 Gross per 24 hour  Intake 1488.44 ml  Output 350 ml  Net 1138.44 ml   Filed Weights   12/10/18 0500  Weight: 77.6 kg    Examination:  GENERAL: No acute distress.  Sleepy with wrist and waist belt restraints and mittens. HEENT: MMM.  Vision and hearing grossly intact.  NECK: Supple.  No apparent JVD.  RESP:  No IWOB.  Fair air movement bilaterally. CVS:  RRR. Heart sounds normal.  ABD/GI/GU: Bowel sounds present. Soft. Non tender.  Soft abdominal belt restraints. MSK/EXT:  Moves extremities.  Bilateral wrist restraints. SKIN: Small healing wound about 1 cm in diameter in left groin.  No apparent discharge or fluctuance.  No apparent  erythema. NEURO: Sleepy but arises to voice briefly and goes back to sleep.  No apparent focal neuro deficit. PSYCH: Sleepy.   I have personally reviewed the following labs and images:  Radiology Studies: No results found.  Microbiology: Recent Results (from the past 240 hour(s))  Culture, blood (Routine X 2) w Reflex to ID Panel     Status: None (Preliminary result)   Collection Time: 12/09/18 10:09 AM   Specimen: BLOOD  Result Value Ref Range Status   Specimen Description   Final    BLOOD LEFT ANTECUBITAL Performed at [facility] [address], [place], [place] [address]    Special Requests   Final    BOTTLES DRAWN AEROBIC AND ANAEROBIC Blood Culture adequate volume Performed at [facility] [address], [place], [place] [address]    Culture   Final    NO GROWTH < 24 HOURS Performed at [facility] [address], [place], [place] [address]    Report Status PENDING  Incomplete  Culture, blood (Routine X 2) w Reflex to ID Panel     Status: None (Preliminary result)   Collection Time: 12/09/18  2:31 PM   Specimen: BLOOD  Result Value Ref Range Status   Specimen Description   Final    BLOOD RIGHT ANTECUBITAL Performed at [facility] [address], [place], [place] [address]    Special  Requests   Final    BOTTLES DRAWN AEROBIC AND ANAEROBIC Blood Culture adequate volume Performed at [facility] [address] [place], [place] [address]    Culture   Final    NO GROWTH < 12 HOURS Performed at [facility] [address] [place],  [address]    Report Status PENDING  Incomplete  SARS Coronavirus 2 [facility] order, Performed in [facility] hospital lab) Nasopharyngeal Nasopharyngeal Swab     Status: None   Collection Time: 12/09/18  5:06 PM   Specimen: Nasopharyngeal Swab  Result Value Ref Range Status   SARS Coronavirus 2 NEGATIVE NEGATIVE Final    Comment:  (NOTE) If result is NEGATIVE SARS-CoV-2 target nucleic acids are NOT DETECTED. The SARS-CoV-2 RNA is generally detectable in upper and lower  respiratory specimens during the acute phase of infection. The lowest  concentration of SARS-CoV-2 viral copies this assay can detect is 250  copies / mL. A negative result does not preclude SARS-CoV-2 infection  and should not be used as the sole basis for treatment or other  patient management decisions.  A negative result may occur with  improper specimen collection / handling, submission of specimen other  than nasopharyngeal swab, presence of viral mutation(s) within the  areas targeted by this assay, and inadequate number of viral copies  (<250 copies / mL). A negative result must be combined with clinical  observations, patient history, and epidemiological information. If result is POSITIVE SARS-CoV-2 target nucleic acids are DETECTED. The SARS-CoV-2 RNA is generally detectable in upper and lower  respiratory specimens dur ing the acute phase of infection.  Positive  results are indicative of active infection with SARS-CoV-2.  Clinical  correlation with patient history and other diagnostic information is  necessary to determine patient infection status.  Positive results do  not rule out bacterial infection or co-infection with other viruses. If result is PRESUMPTIVE POSTIVE SARS-CoV-2 nucleic acids MAY BE PRESENT.   A presumptive positive result was obtained on the submitted specimen  and confirmed on repeat testing.  While 2019 novel coronavirus  (SARS-CoV-2) nucleic acids may be present in the submitted sample  additional confirmatory testing may be necessary for epidemiological  and / or clinical management purposes  to differentiate between  SARS-CoV-2 and other Sarbecovirus currently known to infect humans.  If clinically indicated additional testing with an alternate test  methodology [id number]) is advised. The SARS-CoV-2 RNA is  generally  detectable in upper and lower respiratory sp ecimens during the acute  phase of infection. The expected result is Negative. Fact Sheet for Patients:  StrictlyIdeas.no Fact Sheet for Healthcare Providers: BankingDealers.co.za This test is not yet approved or cleared by the [place] FDA and has been authorized for detection and/or diagnosis of SARS-CoV-2 by FDA under an Emergency Use Authorization (EUA).  This EUA will remain in effect (meaning this test can be used) for the duration of the COVID-19 declaration under Section 564(b)(1) of the Act, 21 U.S.C. section 360bbb-3(b)(1), unless the authorization is terminated or revoked sooner. Performed at [facility], [place] [address] [place],  [address]   MRSA PCR Screening     Status: None   Collection Time: 12/10/18  5:52 AM   Specimen: Nasal Mucosa; Nasopharyngeal  Result Value Ref Range Status   MRSA by PCR NEGATIVE NEGATIVE Final    Comment:        The GeneXpert MRSA Assay (FDA approved for NASAL specimens only), is one  component of a comprehensive MRSA colonization surveillance program. It is not intended to diagnose MRSA infection nor to guide or monitor treatment for MRSA infections. Performed at [facility], [place] [address], [place], [place] [address]     Sepsis Labs: Invalid input(s): PROCALCITONIN, LACTICIDVEN  Urine analysis:    Component Value Date/Time   COLORURINE YELLOW 12/09/2018 1718   APPEARANCEUR CLEAR 12/09/2018 1718   LABSPEC 1.020 12/09/2018 1718   PHURINE 6.0 12/09/2018 1718   GLUCOSEU 150 (A) 12/09/2018 1718   HGBUR NEGATIVE 12/09/2018 1718   BILIRUBINUR MODERATE (A) 12/09/2018 1718   KETONESUR NEGATIVE 12/09/2018 1718   PROTEINUR 30 (A) 12/09/2018 1718   UROBILINOGEN 1.0 12/16/2008 0451   NITRITE NEGATIVE 12/09/2018 1718   LEUKOCYTESUR NEGATIVE 12/09/2018 1718    Anemia Panel: No  results for input(s): VITAMINB12, FOLATE, FERRITIN, TIBC, IRON, RETICCTPCT in the last 72 hours.  Thyroid Function Tests: Recent Labs    12/08/18 1109  TSH 1.901    Lipid Profile: No results for input(s): CHOL, HDL, LDLCALC, TRIG, CHOLHDL, LDLDIRECT in the last 72 hours.  CBG: Recent Labs  Lab 12/09/18 2030 12/10/18 0051 12/10/18 0413 12/10/18 0735  GLUCAP 140* 139* 113* 105*    HbA1C: Recent Labs    12/09/18 1009  HGBA1C 7.0*    BNP (last 3 results): No results for input(s): PROBNP in the last 8760 hours.  Cardiac Enzymes: No results for input(s): CKTOTAL, CKMB, CKMBINDEX, TROPONINI in the last 168 hours.  Coagulation Profile: No results for input(s): INR, PROTIME in the last 168 hours.  Liver Function Tests: Recent Labs  Lab 12/08/18 1109 12/09/18 1009  AST 22 32  ALT 15 20  ALKPHOS 110 106  BILITOT 0.8 1.1  PROT 6.3* 6.5  ALBUMIN 2.7* 2.7*   Recent Labs  Lab 12/09/18 1009  LIPASE 21   No results for input(s): AMMONIA in the last 168 hours.  Basic Metabolic Panel: Recent Labs  Lab 12/08/18 1109 12/09/18 1009 12/09/18 2055 12/10/18 0552  NA 139 136  --  138  K 3.2* 3.8  --  3.5  CL 103 100  --  103  CO2 24 24  --  22  GLUCOSE 108* 147*  --  115*  BUN 6* 10  --  11  CREATININE 0.82 1.08 1.00 1.00  CALCIUM 8.4* 8.2*  --  8.0*  MG  --   --   --  1.3*   GFR: Estimated Creatinine Clearance: 70.7 mL/min (by C-G formula based on SCr of 1 mg/dL).  CBC: Recent Labs  Lab 12/08/18 1109 12/09/18 1009 12/10/18 0552  WBC 4.9 9.6 8.4  NEUTROABS 3.5 8.8* 7.0  HGB 9.7* 10.1* 10.1*  HCT 29.0* 31.6* 31.1*  MCV 95.4 98.8 97.2  PLT 219 238 165    Procedures:  None  Microbiology summarized: SARS-CoV-2 screening negative.  Assessment & Plan: Undifferentiated sepsis/febrile illness in immunocompromised patient on chemotherapy: sepsis physiology improving.  Still with no clear source of infection.  CT head without acute finding.  CXR with  multiple changes thought to be related to cancer and post XRT changes per his oncologist.  WBC 9.6 with bandemia which is twice his baseline.  SARS-CoV-2 negative.  UA not impressive.  Blood cultures negative so far.  Concerned that this could be related to his chemotherapy. -Continue vancomycin and cefepime -Follow cultures -Continue trending lactic acid  Acute metabolic encephalopathy: Likely due to the above.  Patient was agitated requiring physical restraints.  He is currently sleepy.  CT head without acute finding.  No focal neuro deficit on exam although limited.  TSH within normal range.  He is on gabapentin, Percocet, Compazine, tramadol and Benadryl which could contribute but should have improved by now.  -Check  ammonia level and vitamin B12 -May consider MRI brain but not sure if she is going to tolerate.  -Remains n.p.o. on IV fluid. -We will avoid potential sedating medications now.  Fairly controlled IDDM-2: Not in DKA.  A1c 7.0%.  CBG within fair range. -Continue CBG monitoring -Continue SSI.  Hypertension: BP elevated this morning. -Hold oral meds while encephalopathic. -IV Lopressor and hydralazine  Stage IV non-small cell lung cancer with bone mets (PDL-1 +, ALK, and EGFR -): diagnosed in 08/2018, s/p portacath placement 11/01/2018 , last chemo/immunotherapy on 8/5  -Per admitting provider, oncology Dr Julien Nordmann aware of hospitalization.  Hypothyroidism: -Check TSH -IV Synthroid at reduced dose  Hypomagnesemia: -Replenish and recheck  Anemia of chronic disease: Hgb stable -Continue monitoring  Left groin wound: No obvious signs of infection or abscess. -Antibiotics as above -We will continue monitoring  DVT prophylaxis: Subcu Lovenox Code Status: DNR/DNI Family Communication: Patient and/or RN. Available if any question.  Disposition Plan: Remains inpatient.  Stepdown units Consultants: Oncology aware of patient's admission.   Antimicrobials: Anti-infectives  (From admission, onward)   Start     Dose/Rate Route Frequency Ordered Stop   12/10/18 1600  vancomycin (VANCOCIN) 1,500 mg in sodium chloride 0.9 % 500 mL IVPB     1,500 mg 250 mL/hr over 120 Minutes Intravenous Every 24 hours 12/09/18 1906     12/09/18 2200  ceFEPIme (MAXIPIME) 2 g in sodium chloride 0.9 % 100 mL IVPB     2 g 200 mL/hr over 30 Minutes Intravenous Every 8 hours 12/09/18 1906     12/09/18 1430  vancomycin (VANCOCIN) 1,500 mg in sodium chloride 0.9 % 500 mL IVPB     1,500 mg 250 mL/hr over 120 Minutes Intravenous  Once 12/09/18 1400 12/09/18 1858   12/09/18 1400  ceFEPIme (MAXIPIME) 2 g in sodium chloride 0.9 % 100 mL IVPB     2 g 200 mL/hr over 30 Minutes Intravenous  Once 12/09/18 1352 12/09/18 1608      Sch Meds:  Scheduled Meds: . [START ON 12/11/2018] Chlorhexidine Gluconate Cloth  6 each Topical Daily  . enoxaparin (LOVENOX) injection  40 mg Subcutaneous Q24H  . insulin aspart  0-9 Units Subcutaneous Q4H  . mouth rinse  15 mL Mouth Rinse BID  . metoprolol tartrate  5 mg Intravenous Q8H   Continuous Infusions: . sodium chloride Stopped (12/10/18 0531)  . ceFEPime (MAXIPIME) IV Stopped (12/10/18 3794)  . vancomycin     PRN Meds:.acetaminophen  35 minutes with more than 50% spent in reviewing records, counseling patient and coordinating care.  Aaminah Forrester T. [place]  If 7PM-7AM, please contact night-coverage [email or web address] Password [email or web address] 12/10/2018, 12:08 PM

## 2018-12-10 NOTE — Progress Notes (Signed)
Chemo precautions

## 2018-12-10 NOTE — Progress Notes (Signed)
Discussed with Dr. Marin Olp who suggested LP. DG flouro guided LP ordered.

## 2018-12-10 NOTE — ED Notes (Signed)
Pt again trying to get out of bed. Due to pts compromised state, and altered mental status there was serious concern for pts safety if her left the bed. Unable to redirect pt or find alternative method therefor a posey belt was applied to the pt. Provider notified.

## 2018-12-10 NOTE — Progress Notes (Signed)
Modified the non-violent restraint order for soft waist belt only. Soft waist belt was the only restraint present at beginning of my shift. Order for restraints was corrected and MD notified of change.

## 2018-12-11 ENCOUNTER — Inpatient Hospital Stay (HOSPITAL_COMMUNITY): Payer: Medicare Other

## 2018-12-11 ENCOUNTER — Encounter (HOSPITAL_COMMUNITY): Payer: Self-pay

## 2018-12-11 LAB — CBC
HCT: 34.1 % — ABNORMAL LOW (ref 39.0–52.0)
Hemoglobin: 11.1 g/dL — ABNORMAL LOW (ref 13.0–17.0)
MCH: 31.9 pg (ref 26.0–34.0)
MCHC: 32.6 g/dL (ref 30.0–36.0)
MCV: 98 fL (ref 80.0–100.0)
Platelets: 224 10*3/uL (ref 150–400)
RBC: 3.48 MIL/uL — ABNORMAL LOW (ref 4.22–5.81)
RDW: 14.9 % (ref 11.5–15.5)
WBC: 5.5 10*3/uL (ref 4.0–10.5)
nRBC: 0 % (ref 0.0–0.2)

## 2018-12-11 LAB — COMPREHENSIVE METABOLIC PANEL
ALT: 30 U/L (ref 0–44)
AST: 46 U/L — ABNORMAL HIGH (ref 15–41)
Albumin: 2.7 g/dL — ABNORMAL LOW (ref 3.5–5.0)
Alkaline Phosphatase: 89 U/L (ref 38–126)
Anion gap: 10 (ref 5–15)
BUN: 11 mg/dL (ref 8–23)
CO2: 23 mmol/L (ref 22–32)
Calcium: 8.5 mg/dL — ABNORMAL LOW (ref 8.9–10.3)
Chloride: 106 mmol/L (ref 98–111)
Creatinine, Ser: 0.8 mg/dL (ref 0.61–1.24)
GFR calc Af Amer: >60 mL/min (ref >60)
GFR calc non Af Amer: >60 mL/min (ref >60)
Glucose, Bld: 119 mg/dL — ABNORMAL HIGH (ref 70–99)
Potassium: 3.5 mmol/L (ref 3.5–5.1)
Sodium: 139 mmol/L (ref 135–145)
Total Bilirubin: 1.2 mg/dL (ref 0.3–1.2)
Total Protein: 6.4 g/dL — ABNORMAL LOW (ref 6.5–8.1)

## 2018-12-11 LAB — GLUCOSE, CAPILLARY
Glucose-Capillary: 112 mg/dL — ABNORMAL HIGH (ref 70–99)
Glucose-Capillary: 97 mg/dL (ref 70–99)
Glucose-Capillary: 111 mg/dL — ABNORMAL HIGH (ref 70–99)
Glucose-Capillary: 110 mg/dL — ABNORMAL HIGH (ref 70–99)
Glucose-Capillary: 131 mg/dL — ABNORMAL HIGH (ref 70–99)

## 2018-12-11 LAB — PHOSPHORUS: Phosphorus: 2.7 mg/dL (ref 2.5–4.6)

## 2018-12-11 LAB — MAGNESIUM: Magnesium: 1.6 mg/dL — ABNORMAL LOW (ref 1.7–2.4)

## 2018-12-11 MED ORDER — ENOXAPARIN SODIUM 40 MG/0.4ML ~~LOC~~ SOLN
40.0000 mg | SUBCUTANEOUS | Status: DC
  Administered 2018-12-11 – 2018-12-12 (×2): 40 mg via SUBCUTANEOUS
  Filled 2018-12-11 (×2): qty 0.4

## 2018-12-11 MED ORDER — METRONIDAZOLE IN NACL 5-0.79 MG/ML-% IV SOLN
500.0000 mg | Freq: Three times a day (TID) | INTRAVENOUS | Status: DC
  Administered 2018-12-11 – 2018-12-12 (×2): 500 mg via INTRAVENOUS
  Filled 2018-12-11 (×2): qty 100

## 2018-12-11 MED ORDER — ASPIRIN EC 81 MG PO TBEC
81.0000 mg | DELAYED_RELEASE_TABLET | Freq: Every day | ORAL | Status: DC
  Administered 2018-12-11 – 2018-12-13 (×3): 81 mg via ORAL
  Filled 2018-12-11 (×3): qty 1

## 2018-12-11 MED ORDER — MAGNESIUM SULFATE 2 GM/50ML IV SOLN
2.0000 g | Freq: Once | INTRAVENOUS | Status: AC
  Administered 2018-12-11: 2 g via INTRAVENOUS
  Filled 2018-12-11: qty 50

## 2018-12-11 MED ORDER — SODIUM CHLORIDE 0.9 % IV SOLN
500.0000 mg | INTRAVENOUS | Status: DC
  Administered 2018-12-11: 500 mg via INTRAVENOUS
  Filled 2018-12-11: qty 500

## 2018-12-11 MED ORDER — IOHEXOL 300 MG/ML  SOLN
100.0000 mL | Freq: Once | INTRAMUSCULAR | Status: AC | PRN
  Administered 2018-12-11: 100 mL via INTRAVENOUS

## 2018-12-11 MED ORDER — ATORVASTATIN CALCIUM 10 MG PO TABS
20.0000 mg | ORAL_TABLET | Freq: Every day | ORAL | Status: DC
  Administered 2018-12-11 – 2018-12-13 (×3): 20 mg via ORAL
  Filled 2018-12-11 (×3): qty 2

## 2018-12-11 MED ORDER — METOPROLOL SUCCINATE ER 50 MG PO TB24
50.0000 mg | ORAL_TABLET | Freq: Every day | ORAL | Status: DC
  Administered 2018-12-11 – 2018-12-13 (×3): 50 mg via ORAL
  Filled 2018-12-11 (×3): qty 1

## 2018-12-11 MED ORDER — FOLIC ACID 1 MG PO TABS
1.0000 mg | ORAL_TABLET | Freq: Every day | ORAL | Status: DC
  Administered 2018-12-11 – 2018-12-13 (×3): 1 mg via ORAL
  Filled 2018-12-11 (×3): qty 1

## 2018-12-11 MED ORDER — IOHEXOL 300 MG/ML  SOLN
30.0000 mL | Freq: Once | INTRAMUSCULAR | Status: AC | PRN
  Administered 2018-12-11: 16:00:00 30 mL via ORAL

## 2018-12-11 MED ORDER — SODIUM CHLORIDE 0.9 % IV SOLN
2.0000 g | Freq: Three times a day (TID) | INTRAVENOUS | Status: DC
  Administered 2018-12-11 – 2018-12-12 (×2): 2 g via INTRAVENOUS
  Filled 2018-12-11 (×3): qty 2

## 2018-12-11 MED ORDER — SODIUM CHLORIDE (PF) 0.9 % IJ SOLN
INTRAMUSCULAR | Status: AC
  Filled 2018-12-11: qty 50

## 2018-12-11 MED ORDER — RAMIPRIL 10 MG PO CAPS
10.0000 mg | ORAL_CAPSULE | Freq: Every day | ORAL | Status: DC
  Administered 2018-12-11 – 2018-12-13 (×3): 10 mg via ORAL
  Filled 2018-12-11 (×3): qty 1

## 2018-12-11 MED ORDER — LEVOTHYROXINE SODIUM 100 MCG PO TABS
100.0000 ug | ORAL_TABLET | Freq: Every day | ORAL | Status: DC
  Administered 2018-12-12 – 2018-12-13 (×2): 100 ug via ORAL
  Filled 2018-12-11 (×2): qty 1

## 2018-12-11 MED ORDER — AMLODIPINE BESYLATE 5 MG PO TABS
5.0000 mg | ORAL_TABLET | Freq: Every day | ORAL | Status: DC
  Administered 2018-12-11 – 2018-12-13 (×3): 5 mg via ORAL
  Filled 2018-12-11 (×3): qty 1

## 2018-12-11 MED ORDER — SODIUM CHLORIDE 0.9 % IV SOLN
INTRAVENOUS | Status: DC | PRN
  Administered 2018-12-11: 21:00:00 250 mL via INTRAVENOUS

## 2018-12-11 MED ORDER — PANTOPRAZOLE SODIUM 40 MG PO TBEC
40.0000 mg | DELAYED_RELEASE_TABLET | Freq: Two times a day (BID) | ORAL | Status: DC
  Administered 2018-12-11 – 2018-12-13 (×4): 40 mg via ORAL
  Filled 2018-12-11 (×4): qty 1

## 2018-12-11 NOTE — Progress Notes (Signed)
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  Found to have undifferentiated sepsis with fever to 103, tachycardia, AMS and lactic acidosis and admitted on 12/09/2018.  Started on broad-spectrum antibiotic with vancomycin and cefepime.  Blood and urine cultures negative.  Fever resolved.  Mental status improved.   Subjective: No major events overnight of this morning.  Significant improvement in his mental status.  Sleepy but arises easily.  He is oriented x4- date and the president.  Off wrist restraints.  Follows command.  Denies headache, neck stiffness, chest pain, dyspnea, abdominal pain or GU symptoms.  Objective: Vitals:   12/11/18 0500 12/11/18 0600 12/11/18 0800 12/11/18 0833  BP: (!) 135/94 109/80 (!) 114/96   Pulse: 98 98 86   Resp: '18 20 18   ' Temp:    98.1 F (36.7 C)  TempSrc:    Oral  SpO2: 98% 96% 96%   Weight:      Height:        Intake/Output Summary (Last 24 hours) at 12/11/2018 1205 Last data filed at 12/11/2018 1000 Gross per 24 hour  Intake 2423.85 ml  Output 4950 ml  Net -2526.15 ml   Filed Weights   12/10/18 0500  Weight: 77.6 kg    Examination:  GENERAL: No acute distress.  Sleepy but arises easily. HEENT: MMM.  Vision and hearing grossly intact.  NECK: Supple.  No apparent JVD.  No meningeal sign. RESP:  No IWOB. Good air movement bilaterally. CVS:  RRR. Heart sounds normal.  ABD/GI/GU: Bowel sounds present. Soft. Non tender.  Soft abdominal.  Restraints in place. MSK/EXT:  Moves extremities.  No wrist restraints today.  Mittens in place. SKIN:  no apparent skin lesion or wound NEURO: Sleepy but arises easily.  Oriented x4- date and president.  No gross focal neuro deficit. PSYCH: Calm. Normal affect.    I have personally reviewed the following labs and images:  Radiology Studies: No results found.  Microbiology: Recent Results (from the past 240 hour(s))  Culture, blood (Routine X 2) w Reflex to ID Panel     Status: None (Preliminary result)   Collection Time: 12/09/18 10:09 AM   Specimen: BLOOD  Result Value Ref Range Status   Specimen Description   Final    BLOOD LEFT ANTECUBITAL Performed at [facility] [address] [place], [place] [address]    Special Requests   Final    BOTTLES DRAWN AEROBIC AND ANAEROBIC Blood Culture adequate volume Performed at [facility] [address], [place], [place] [address]    Culture   Final    NO GROWTH 2 DAYS Performed at [facility] [address], [place], [place] [address]    Report Status PENDING  Incomplete  Culture, blood (Routine X 2) w Reflex to ID Panel     Status: None (Preliminary result)   Collection Time: 12/09/18  2:31 PM   Specimen: BLOOD  Result Value Ref Range Status   Specimen Description   Final    BLOOD RIGHT ANTECUBITAL Performed at [facility] [address] [place],  [place] [address]    Special Requests   Final    BOTTLES DRAWN AEROBIC AND ANAEROBIC Blood Culture adequate volume Performed at [facility] [address] [place], [place] [address]    Culture   Final    NO GROWTH 2 DAYS Performed at [facility] [address] [place], [place] [address]    Report Status PENDING  Incomplete  SARS Coronavirus 2 [facility] order, Performed in [facility] hospital lab) Nasopharyngeal Nasopharyngeal Swab     Status: None   Collection Time: 12/09/18  5:06 PM   Specimen: Nasopharyngeal Swab  Result Value Ref Range Status   SARS Coronavirus 2 NEGATIVE  NEGATIVE Final    Comment: (NOTE) If result is NEGATIVE SARS-CoV-2 target nucleic acids are NOT DETECTED. The SARS-CoV-2 RNA is generally detectable in upper and lower  respiratory specimens during the acute phase of infection. The lowest  concentration of SARS-CoV-2 viral copies this assay can detect is 250  copies / mL. A negative result does not preclude SARS-CoV-2 infection  and should not be used as the sole basis for treatment or other  patient management decisions.  A negative result may occur with  improper specimen collection / handling, submission of specimen other  than nasopharyngeal swab, presence of viral mutation(s) within the  areas targeted by this assay, and inadequate number of viral copies  (<250 copies / mL). A negative result must be combined with clinical  observations, patient history, and epidemiological information. If result is POSITIVE SARS-CoV-2 target nucleic acids are DETECTED. The SARS-CoV-2 RNA is generally detectable in upper and lower  respiratory specimens dur ing the acute phase of infection.  Positive  results are indicative of active infection with SARS-CoV-2.  Clinical  correlation with patient history and other diagnostic information is  necessary to determine patient infection status.  Positive results do  not rule out bacterial infection or co-infection with other viruses. If result is PRESUMPTIVE POSTIVE SARS-CoV-2 nucleic acids MAY BE PRESENT.   A presumptive positive result was obtained on the submitted specimen  and confirmed on repeat testing.  While 2019 novel coronavirus  (SARS-CoV-2) nucleic acids may be present in the submitted sample  additional confirmatory testing may be necessary for epidemiological  and / or clinical management purposes  to differentiate between  SARS-CoV-2 and other Sarbecovirus currently known to infect humans.  If clinically indicated additional testing with an alternate test  methodology [phone]) is  advised. The SARS-CoV-2 RNA is generally  detectable in upper and lower respiratory sp ecimens during the acute  phase of infection. The expected result is Negative. Fact Sheet for Patients:  StrictlyIdeas.no Fact Sheet for Healthcare Providers: BankingDealers.co.za This test is not yet approved or cleared by the [place] FDA and has been authorized for detection and/or diagnosis of SARS-CoV-2 by FDA under an Emergency Use Authorization (EUA).  This EUA will remain in effect (meaning this test can be used) for the duration of the COVID-19 declaration under Section 564(b)(1) of the Act, 21 U.S.C. section 360bbb-3(b)(1), unless the authorization is terminated or revoked sooner. Performed at [facility], [place] [address] [place], [place] [address]   Urine Culture     Status: None   Collection Time: 12/09/18  5:18 PM   Specimen: Urine, Random  Result Value Ref Range Status   Specimen Description   Final    URINE, RANDOM Performed at [facility] [address], [place], [place] [address]  Special Requests   Final    NONE Performed at [facility], [place] [address], [place], [place] [address]    Culture   Final    NO GROWTH Performed at [facility], [place] [address], [place], [place] [address]    Report Status 12/10/2018 FINAL  Final  MRSA PCR Screening     Status: None   Collection Time: 12/10/18  5:52 AM   Specimen: Nasal Mucosa; Nasopharyngeal  Result Value Ref Range Status   MRSA by PCR NEGATIVE NEGATIVE Final    Comment:        The GeneXpert MRSA Assay (FDA approved for NASAL specimens only), is one component of a comprehensive MRSA colonization surveillance program. It is not intended to diagnose MRSA infection nor to guide or monitor treatment for MRSA infections. Performed at [facility], [place] [address] [place], [place] [address]      Sepsis Labs: Invalid input(s): PROCALCITONIN, LACTICIDVEN  Urine analysis:    Component Value Date/Time   COLORURINE YELLOW 12/09/2018 [place] 12/09/2018 1718   LABSPEC 1.020 12/09/2018 1718   PHURINE 6.0 12/09/2018 1718   GLUCOSEU 150 (A) 12/09/2018 1718   HGBUR NEGATIVE 12/09/2018 1718   BILIRUBINUR MODERATE (A) 12/09/2018 1718   KETONESUR NEGATIVE 12/09/2018 1718   PROTEINUR 30 (A) 12/09/2018 1718   UROBILINOGEN 1.0 12/16/2008 0451   NITRITE NEGATIVE 12/09/2018 1718   LEUKOCYTESUR NEGATIVE 12/09/2018 1718    Anemia Panel: Recent Labs    12/10/18 1244  VITAMINB12 2,861*    Thyroid Function Tests: No results for input(s): TSH, T4TOTAL, FREET4, T3FREE, THYROIDAB in the last 72 hours.  Lipid Profile: No results for input(s): CHOL, HDL, LDLCALC, TRIG, CHOLHDL, LDLDIRECT in the last 72 hours.  CBG: Recent Labs  Lab 12/10/18 1532 12/10/18 1953 12/10/18 2304 12/11/18 0318 12/11/18 0748  GLUCAP 121* 113* 106* 112* 97    HbA1C: Recent Labs    12/09/18 1009  HGBA1C 7.0*    BNP (last 3 results): No results for input(s): PROBNP in the last 8760 hours.  Cardiac Enzymes: No results for input(s): CKTOTAL, CKMB, CKMBINDEX, TROPONINI in the last 168 hours.  Coagulation Profile: No results for input(s): INR, PROTIME in the last 168 hours.  Liver Function Tests: Recent Labs  Lab 12/08/18 1109 12/09/18 1009 12/11/18 0239  AST 22 32 46*  ALT '15 20 30  ' ALKPHOS 110 106 89  BILITOT 0.8 1.1 1.2  PROT 6.3* 6.5 6.4*  ALBUMIN 2.7* 2.7* 2.7*   Recent Labs  Lab 12/09/18 1009  LIPASE 21   Recent Labs  Lab 12/10/18 1244  AMMONIA 26    Basic Metabolic Panel: Recent Labs  Lab 12/08/18 1109 12/09/18 1009 12/09/18 2055 12/10/18 0552 12/11/18 0239  NA 139 136  --  138 139  K 3.2* 3.8  --  3.5 3.5  CL 103 100  --  103 106  CO2 24 24  --  22 23  GLUCOSE 108* 147*  --  115* 119*  BUN 6* 10  --  11 11  CREATININE 0.82 1.08 1.00 1.00  0.80  CALCIUM 8.4* 8.2*  --  8.0* 8.5*  MG  --   --   --  1.3* 1.6*  PHOS  --   --   --   --  2.7   GFR: Estimated Creatinine Clearance: 88.4 mL/min (by C-G formula based on SCr of 0.8 mg/dL).  CBC: Recent Labs  Lab 12/08/18 1109 12/09/18 1009 12/10/18  3428 12/11/18 0239  WBC 4.9 9.6 8.4 5.5  NEUTROABS 3.5 8.8* 7.0  --   HGB 9.7* 10.1* 10.1* 11.1*  HCT 29.0* 31.6* 31.1* 34.1*  MCV 95.4 98.8 97.2 98.0  PLT 219 238 165 224    Procedures:  None  Microbiology summarized: SARS-CoV-2 screening negative.  Assessment & Plan: Undifferentiated sepsis/febrile illness in immunocompromised patient on chemotherapy: sepsis physiology resolved.  No clear source of infection.  CT head without acute finding.  CXR with multiple changes thought to be related to cancer and post XRT changes per his oncologist.  SARS-CoV-2 negative, UA, blood culture and urine cultures negative. Concern that this could be related to his chemotherapy. -Observe off antibiotics -CT chest, abdomen and pelvis with contrast-this was ordered by his oncologist the day before admission.   Acute metabolic encephalopathy: Could be due to the above.  Also polypharmacy.  He is on gabapentin, Percocet, Compazine, tramadol and Benadryl.  No meningeal signs.  TSH, ammonia and vitamin B12 within normal range.  CT head without acute finding.  Mental status improved tremendously today.  He is oriented x4- date and the president. -Discontinue soft restraints. -Frequent reorientation and delirium precautions. -Minimize sedating medications. -Bedside swallow eval -Discussed with Dr. Marin Olp, oncology on 8/7 who suggested lumbar puncture.  Unfortunately results not done as patient had already received Lovenox.  Now doubt the need for LP as patient's mental status has improved tremendously. -PT/OT eval  Stage IV non-small cell lung cancer with bone mets (PDL-1 +, ALK, and EGFR -): diagnosed in 08/2018, s/p portacath placement 11/01/2018 ,  last chemo/immunotherapy on 8/5  -Per admitting provider, oncology Dr Julien Nordmann aware of hospitalization. -We will proceed with CT chest/abdomen/pelvis  Fairly controlled IDDM-2: Not in DKA.  A1c 7.0%.  CBG within fair range. -Continue CBG monitoring -Continue SSI.  Hypertension: Normotensive -Resume home meds.  Hypothyroidism: TSH within normal. -Resume home Synthroid.  Hypomagnesemia:  -Replenish and recheck.  Anemia of chronic disease: Hgb stable -Continue monitoring  Left groin wound: No obvious signs of infection or abscess. -Wound care by RN.  DVT prophylaxis: Subcu Lovenox Code Status: DNR/DNI Family Communication: Patient and/or RN. Available if any question.  Disposition Plan: Transfer to [facility] floor.  Will observe off antibiotic for the next 24 hours. Consultants: Oncology aware of patient's admission.   Antimicrobials: Anti-infectives (From admission, onward)   Start     Dose/Rate Route Frequency Ordered Stop   12/10/18 1600  vancomycin (VANCOCIN) 1,500 mg in sodium chloride 0.9 % 500 mL IVPB  Status:  Discontinued     1,500 mg 250 mL/hr over 120 Minutes Intravenous Every 24 hours 12/09/18 1906 12/11/18 1205   12/09/18 2200  ceFEPIme (MAXIPIME) 2 g in sodium chloride 0.9 % 100 mL IVPB     2 g 200 mL/hr over 30 Minutes Intravenous Every 8 hours 12/09/18 1906     12/09/18 1430  vancomycin (VANCOCIN) 1,500 mg in sodium chloride 0.9 % 500 mL IVPB     1,500 mg 250 mL/hr over 120 Minutes Intravenous  Once 12/09/18 1400 12/09/18 1858   12/09/18 1400  ceFEPIme (MAXIPIME) 2 g in sodium chloride 0.9 % 100 mL IVPB     2 g 200 mL/hr over 30 Minutes Intravenous  Once 12/09/18 1352 12/09/18 1608      Sch Meds:  Scheduled Meds: . Chlorhexidine Gluconate Cloth  6 each Topical Daily  . insulin aspart  0-9 Units Subcutaneous Q4H  . levothyroxine  50 mcg Intravenous Daily  . mouth rinse  15 mL Mouth Rinse BID  . metoprolol tartrate  5 mg Intravenous Q8H   Continuous  Infusions: . sodium chloride Stopped (12/11/18 0944)  . ceFEPime (MAXIPIME) IV Stopped (12/11/18 8706)   PRN Meds:.acetaminophen  Kaire Stary T. [place]  If 7PM-7AM, please contact night-coverage [email or web address] Password [email or web address] 12/11/2018, 12:05 PM

## 2018-12-11 NOTE — Progress Notes (Signed)
Pt has arrived to Room 1503 from ICU. Alert, oriented, and without c/o. Spouse at bedside.

## 2018-12-11 NOTE — Progress Notes (Signed)
Pharmacy Antibiotic Note  Zachary Knox is a 68 y.o. male admitted on 12/09/2018 with with stage IV non-small cell lung cancer undergoing chemo presents with sepsis.   Pharmacy initially consulted to dose vanc and cefepime.  Both were d/c'd earlier today but now cefepime has been resumed for HCAP.  Plan: Resume cefepime 2gm IV q8h Follow renal function and LOT  Height: 5\' 9"  (175.3 cm) Weight: 171 lb 1.2 oz (77.6 kg) IBW/kg (Calculated) : 70.7  Temp (24hrs), Avg:98.1 F (36.7 C), Min:97.7 F (36.5 C), Max:98.7 F (37.1 C)  Recent Labs  Lab 12/08/18 1109 12/09/18 1009 12/09/18 1752 12/09/18 2055 12/10/18 0552 12/10/18 1244 12/11/18 0239  WBC 4.9 9.6  --   --  8.4  --  5.5  CREATININE 0.82 1.08  --  1.00 1.00  --  0.80  LATICACIDVEN  --  3.7* 2.9*  --   --  1.9  --     Estimated Creatinine Clearance: 88.4 mL/min (by C-G formula based on SCr of 0.8 mg/dL).    No Known Allergies  Antimicrobials this admission: 8/6 vanc >> 8/8 8/6 cefepime >> 8/8 azithromycin >> 8/8 flagyl >> Dose adjustments this admission:   Microbiology results: 8/6 BCx: ngtd 8/6 UCx: NGF 8/7 MRSA PCR: neg  Thank you for allowing pharmacy to be a part of this patient's care.  Dolly Rias RPh 12/11/2018, 6:54 PM Pager [phone]

## 2018-12-11 NOTE — Evaluation (Signed)
SLP Cancellation Note  Patient Details Name: Zachary Knox MRN: [id number] DOB: 1951-05-05   Cancelled treatment:       Reason Eval/Treat Not Completed: Other (comment)(spoke to RN who reports pt to have CT chest with contrast for cancer staging today, will continue efforts)  RN states pt tolerated contrast via po.  Will continue efforts.   Luanna Salk, MS [facility] SLP Acute Rehab Services Pager [phone] Office [phone]    Macario Golds 12/11/2018, 3:44 PM

## 2018-12-11 NOTE — Evaluation (Addendum)
Clinical/Bedside Swallow Evaluation Patient Details  Name: Zachary Knox MRN: [id number] Date of Birth: 04-05-1951  Today's Date: 12/11/2018 Time: SLP Start Time (ACUTE ONLY): 4008 SLP Stop Time (ACUTE ONLY): 1745 SLP Time Calculation (min) (ACUTE ONLY): 20 min  Past Medical History:  Past Medical History:  Diagnosis Date  . Cancer ([place]) 2020   lung  . Diabetes mellitus without complication ([place])   . Dyspnea    increased exertion  . Hypertension    Past Surgical History:  Past Surgical History:  Procedure Laterality Date  . CARDIAC SURGERY     Heart attack in 2008 with stent placement  . IR IMAGING GUIDED PORT INSERTION  11/01/2018  . stent in heart     HPI:  pt is a 68 yo male adm to [facility] with sepsis, acute encephalopathy and fever.  Pt has PMH + for NSCLC stage IV with bone mets diagnosed in April 2020, portacath placed 11/01/2018 - received chemo 8/5 and then developed chills, confusion and generalized weakness.  Diagnosed with sepsis.    Swallow eval ordered.   Pt denies current dysphagia and states dysphagia was only mild with radiation.  Pt does have h/o small sliding scale hiatal hernia and reflux diagnosed per UGI in 2001 and he admits to some reflux issues.  Wife present during session .   Assessment / Plan / Recommendation Clinical Impression  Patient presents with functional oropharyngeal swallow ability via clinical assessment. His voice is strong thus indicative of adequate vocal fold closure.    Suspect mentation was source of earlier concerns for ability to consume po intake.    Pt seen with dinner meal with wife present.  No s/s of aspiration with all po observed and pt was encouraged to feed himself for neurological input/swallow safety.  WFL "mastication" for lack of dentures - prolonged "mastication" with spaghetti but adequate eventual clearance and pt with intact sensation.   Adequate "mastication" of soft pears.  Swift swallow with liquids and no indications of  residuals.  SLP asked wife to bring in pt's dentures and denture adhesive tomorrow (he wears them to eat).    Pt is aware of what he can tolerate without dentures stating "I don't eat salads without my teeth" - thus recommend continue regular/thin diet.    No SLP follow up indicated as all education completed with pt and his wife to encourage pt to self feed, eat slowly, follow reflux and aspiration precautions.  Thanks for this referral.  SLP Visit Diagnosis: Dysphagia, unspecified (R13.10)    Aspiration Risk  Mild aspiration risk    Diet Recommendation Regular;Thin liquid   Liquid Administration via: Cup;Straw Medication Administration: Whole meds with liquid Supervision: Patient able to self feed Compensations: Slow rate;Small sips/bites(pt to feed self!) Postural Changes: Remain upright for at least 30 minutes after po intake;Seated upright at 90 degrees    Other  Recommendations Oral Care Recommendations: Oral care BID   Follow up Recommendations None      Frequency and Duration     n/a       Prognosis   n/a     Swallow Study   General Date of Onset: 12/11/18 HPI: pt is a 68 yo male adm to [facility] with sepsis, acute encephalopathy and fever.  Pt has PMH + for lung cancer stage IV diagnosed in March 2020 - chemoradiation tx and h/o MI.  Swallow eval ordered.   Pt denies current dysphagia and states dysphagia was only mild with radiation.  Pt does  have h/o small sliding scale hiatal hernia and reflux diagnosed per UGI in 2001 and he admits to some reflux issues.  Wife present during session . Type of Study: Bedside Swallow Evaluation Diet Prior to this Study: Regular;Thin liquids Temperature Spikes Noted: No(was 103.2) Respiratory Status: Nasal cannula History of Recent Intubation: No Behavior/Cognition: Alert;Cooperative;Pleasant mood Oral Cavity Assessment: Within Functional Limits Oral Care Completed by SLP: No Oral Cavity - Dentition: (dentures at home, wife to bring  them tomorrow) Vision: Functional for self-feeding Self-Feeding Abilities: Able to feed self Patient Positioning: Upright in bed Baseline Vocal Quality: Low vocal intensity Volitional Cough: Strong Volitional Swallow: Able to elicit    Oral/Motor/Sensory Function Overall Oral Motor/Sensory Function: Within functional limits   Ice Chips Ice chips: Not tested   Thin Liquid Thin Liquid: Within functional limits Presentation: Cup;Self Fed    Nectar Thick Nectar Thick Liquid: Not tested   Honey Thick Honey Thick Liquid: Not tested   Puree Puree: Not tested   Solid     Solid: Within functional limits Presentation: Self Fed Other Comments: WFL for lack of dentures - prolonged "mastication" with spaghetti but adequate eventual clearance, adequate mastication of soft pears - wife advised she will bring his dentures and pt aware of what he can tolerate without dentures stating "I don't eat salads without my teeth" - thus recommend continue regular/thin diet.      Zachary Knox 12/11/2018,6:10 PM   Luanna Salk, [place] [facility] SLP Acute Rehab Services Pager [phone] Office [phone]

## 2018-12-12 DIAGNOSIS — G9340 Encephalopathy, unspecified: Secondary | ICD-10-CM

## 2018-12-12 DIAGNOSIS — E876 Hypokalemia: Secondary | ICD-10-CM

## 2018-12-12 LAB — GLUCOSE, CAPILLARY
Glucose-Capillary: 116 mg/dL — ABNORMAL HIGH (ref 70–99)
Glucose-Capillary: 118 mg/dL — ABNORMAL HIGH (ref 70–99)
Glucose-Capillary: 127 mg/dL — ABNORMAL HIGH (ref 70–99)
Glucose-Capillary: 128 mg/dL — ABNORMAL HIGH (ref 70–99)
Glucose-Capillary: 138 mg/dL — ABNORMAL HIGH (ref 70–99)
Glucose-Capillary: 140 mg/dL — ABNORMAL HIGH (ref 70–99)

## 2018-12-12 LAB — COMPREHENSIVE METABOLIC PANEL
ALT: 28 U/L (ref 0–44)
AST: 39 U/L (ref 15–41)
Albumin: 2.5 g/dL — ABNORMAL LOW (ref 3.5–5.0)
Alkaline Phosphatase: 83 U/L (ref 38–126)
Anion gap: 9 (ref 5–15)
BUN: 13 mg/dL (ref 8–23)
CO2: 24 mmol/L (ref 22–32)
Calcium: 8.2 mg/dL — ABNORMAL LOW (ref 8.9–10.3)
Chloride: 106 mmol/L (ref 98–111)
Creatinine, Ser: 0.79 mg/dL (ref 0.61–1.24)
GFR calc Af Amer: >60 mL/min (ref >60)
GFR calc non Af Amer: >60 mL/min (ref >60)
Glucose, Bld: 132 mg/dL — ABNORMAL HIGH (ref 70–99)
Potassium: 3.2 mmol/L — ABNORMAL LOW (ref 3.5–5.1)
Sodium: 139 mmol/L (ref 135–145)
Total Bilirubin: 0.9 mg/dL (ref 0.3–1.2)
Total Protein: 5.8 g/dL — ABNORMAL LOW (ref 6.5–8.1)

## 2018-12-12 LAB — MAGNESIUM: Magnesium: 2 mg/dL (ref 1.7–2.4)

## 2018-12-12 LAB — PHOSPHORUS: Phosphorus: 2.9 mg/dL (ref 2.5–4.6)

## 2018-12-12 MED ORDER — POTASSIUM CHLORIDE CRYS ER 20 MEQ PO TBCR
40.0000 meq | EXTENDED_RELEASE_TABLET | ORAL | Status: AC
  Administered 2018-12-12 (×2): 40 meq via ORAL
  Filled 2018-12-12 (×2): qty 2

## 2018-12-12 MED ORDER — METRONIDAZOLE 500 MG PO TABS: 500.0000 mg | ORAL_TABLET | Freq: Three times a day (TID) | ORAL | Status: DC

## 2018-12-12 MED ORDER — AZITHROMYCIN 250 MG PO TABS
500.0000 mg | ORAL_TABLET | Freq: Every day | ORAL | Status: DC
  Administered 2018-12-12: 500 mg via ORAL
  Filled 2018-12-12: qty 2

## 2018-12-12 MED ORDER — AMOXICILLIN-POT CLAVULANATE 875-125 MG PO TABS
1.0000 | ORAL_TABLET | Freq: Two times a day (BID) | ORAL | Status: DC
  Administered 2018-12-12 – 2018-12-13 (×3): 1 via ORAL
  Filled 2018-12-12 (×3): qty 1

## 2018-12-12 NOTE — Progress Notes (Signed)
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  Found to have undifferentiated sepsis with fever to 103, tachycardia, AMS and lactic acidosis and admitted on 12/09/2018.  Started on broad-spectrum antibiotic with vancomycin and cefepime.  Blood and urine cultures negative.  Fever resolved.  Mental status improved.  Restraints discontinued.  CT chest/abdomen/pelvis obtained on 12/11/2018 and revealed new consolidation with near complete opacification of superior segment of RLL obscuring previously demonstrated subpleural pulmonary malignancy, and new peribronchial RML consolidation but no metastatic disease to abdomen or pelvis.  Patient continued on cefepime.  Started on Flagyl and azithromycin.   Subjective: No major events overnight of this morning.  Reports some cough with whitish phlegm.  He denies chest pain or dyspnea.  Denies headache, GI or GU symptoms.  Oriented x4  Objective: Vitals:   12/11/18 1651 12/11/18 1700 12/11/18 2042 12/12/18 0422  BP: (!) 120/102  118/84 99/74  Pulse: (!) 105 (!) 102 90 84  Resp: '16  16 18  ' Temp: 98.7 F (37.1 C)  97.6 F (36.4 C) (!) 97.4 F (36.3 C)  TempSrc: Oral  Oral Oral  SpO2: 100%  96% 100%  Weight:      Height:        Intake/Output Summary (Last 24 hours) at 12/12/2018 1129 Last data filed at 12/12/2018 0610 Gross per 24 hour  Intake 834.52 ml  Output 2300 ml  Net -1465.48 ml   Filed Weights   12/10/18 0500  Weight: 77.6 kg    Examination:  GENERAL: No acute distress.  Appears well.  Sitting up in bed eating breakfast. HEENT:  MMM.  Vision and hearing grossly intact.  NECK: Supple.  No apparent JVD.  RESP:  No IWOB. Good air movement bilaterally. CVS:  RRR. Heart sounds normal.  ABD/GI/GU: Bowel sounds present. Soft. Non tender.  MSK/EXT:  Moves extremities. No apparent deformity or edema.  SKIN: no apparent skin lesion or wound NEURO: Awake, alert and oriented x4.Marland Kitchen  Neuro exam grossly intact. PSYCH: Calm. Normal affect.    I have personally reviewed the following labs and images:  Radiology Studies: Ct Chest W Contrast  Result Date: 12/11/2018 CLINICAL DATA:  Restaging lung cancer. Initial diagnosis March 2020. EXAM: CT CHEST, ABDOMEN, AND PELVIS WITH CONTRAST TECHNIQUE: Multidetector CT imaging of the chest, abdomen and pelvis was performed following the standard protocol during bolus administration of intravenous contrast. CONTRAST:  64m OMNIPAQUE IOHEXOL 300 MG/ML SOLN, 1043mOMNIPAQUE IOHEXOL 300 MG/ML SOLN COMPARISON:  October 12, 2018 FINDINGS: CT CHEST FINDINGS Cardiovascular: Injectable port terminates at the cavoatrial junction. Normal heart size. Minimal pericardial effusion. Calcific atherosclerotic disease of the coronary arteries. Mild calcific atherosclerotic disease of the aorta. No central pulmonary embolus. Mediastinum/Nodes: No enlarged mediastinal, hilar, or axillary lymph nodes. Thyroid gland, trachea, and esophagus demonstrate no significant findings. Right precarinal lymph node measures 9 mm in short axis, sub pathologic by CT criteria. Lungs/Pleura: Marked upper lobe predominant paraseptal emphysema. Interval development of near complete opacification of the superior segment of the right lower lobe, which obscures the previously demonstrated subpleural pulmonary nodule. Spiculated peribronchial airspace opacity is now seen  in the right middle lobe. Air bronchograms within these abnormalities suggest consolidation. Musculoskeletal: No chest wall mass or suspicious bone lesions identified. CT ABDOMEN  PELVIS FINDINGS Hepatobiliary: No focal liver abnormality is seen. No gallstones, gallbladder wall thickening, or biliary dilatation. Pancreas: Unremarkable. No pancreatic ductal dilatation or surrounding inflammatory changes. Spleen: Normal in size without focal abnormality. Adrenals/Urinary Tract: Adrenal glands are unremarkable. Kidneys are normal, without renal calculi, focal lesion, or hydronephrosis. Right renal cyst. Bladder is unremarkable. Stomach/Bowel: Stomach is within normal limits. Appendix appears normal. No evidence of bowel wall thickening, distention, or inflammatory changes. Vascular/Lymphatic: Aortic atherosclerosis. No enlarged abdominal or pelvic lymph nodes. 8 mm nonspecific central mesenteric lymph node, image 87/136, sequence 2. Reproductive: The prostate is nonenlarged. Other: No abdominal wall hernia or abnormality. No abdominopelvic ascites. Musculoskeletal: Healing fracture of the right posterior eighth rib within an area of sclerosis, likely pathologic fracture within a metastatic lesion. No new lytic or sclerotic lesions. IMPRESSION: 1. Interval development of airspace consolidation with near complete opacification of the superior segment of the right lower lobe, which obscures the previously demonstrated subpleural pulmonary malignancy. 2. Interval development of additional peribronchial airspace consolidation in the right middle lobe. 3. No evidence of metastatic disease to the abdomen or pelvis. 4. Healing pathologic fracture of the right posterior eighth rib. Aortic Atherosclerosis (ICD10-I70.0) and Emphysema (ICD10-J43.9). Electronically Signed   By: Fidela Salisbury M.D.   On: 12/11/2018 18:35   Ct Abdomen Pelvis W Contrast  Result Date: 12/11/2018 CLINICAL DATA:  Restaging lung cancer. Initial diagnosis March 2020. EXAM: CT CHEST, ABDOMEN, AND PELVIS WITH CONTRAST TECHNIQUE: Multidetector CT imaging of the chest, abdomen and pelvis was performed following the standard  protocol during bolus administration of intravenous contrast. CONTRAST:  45m OMNIPAQUE IOHEXOL 300 MG/ML SOLN, 1015mOMNIPAQUE IOHEXOL 300 MG/ML SOLN COMPARISON:  October 12, 2018 FINDINGS: CT CHEST FINDINGS Cardiovascular: Injectable port terminates at the cavoatrial junction. Normal heart size. Minimal pericardial effusion. Calcific atherosclerotic disease of the coronary arteries. Mild calcific atherosclerotic disease of the aorta. No central pulmonary embolus. Mediastinum/Nodes: No enlarged mediastinal, hilar, or axillary lymph nodes. Thyroid gland, trachea, and esophagus demonstrate no significant findings. Right precarinal lymph node measures 9 mm in short axis, sub pathologic by CT criteria. Lungs/Pleura: Marked upper lobe predominant paraseptal emphysema. Interval development of near complete opacification of the superior segment of the right lower lobe, which obscures the previously demonstrated subpleural pulmonary nodule. Spiculated peribronchial airspace opacity is now seen in the right middle lobe. Air bronchograms within these abnormalities suggest consolidation. Musculoskeletal: No chest wall mass or suspicious bone lesions identified. CT ABDOMEN PELVIS FINDINGS Hepatobiliary: No focal liver abnormality is seen. No gallstones, gallbladder wall thickening, or biliary dilatation. Pancreas: Unremarkable. No pancreatic ductal dilatation or surrounding inflammatory changes. Spleen: Normal in size without focal abnormality. Adrenals/Urinary Tract: Adrenal glands are unremarkable. Kidneys are normal, without renal calculi, focal lesion, or hydronephrosis. Right renal cyst. Bladder is unremarkable. Stomach/Bowel: Stomach is within normal limits. Appendix appears normal. No evidence of bowel wall thickening, distention, or inflammatory changes. Vascular/Lymphatic: Aortic atherosclerosis. No enlarged abdominal or pelvic lymph nodes. 8 mm nonspecific central mesenteric lymph node, image 87/136, sequence 2.  Reproductive: The prostate is nonenlarged. Other: No abdominal wall hernia or abnormality. No abdominopelvic ascites. Musculoskeletal: Healing fracture of the right posterior eighth rib within an area of sclerosis, likely pathologic fracture within a metastatic lesion. No new lytic or sclerotic lesions. IMPRESSION: 1. Interval development of airspace consolidation with near complete opacification of the superior segment  of the right lower lobe, which obscures the previously demonstrated subpleural pulmonary malignancy. 2. Interval development of additional peribronchial airspace consolidation in the right middle lobe. 3. No evidence of metastatic disease to the abdomen or pelvis. 4. Healing pathologic fracture of the right posterior eighth rib. Aortic Atherosclerosis (ICD10-I70.0) and Emphysema (ICD10-J43.9). Electronically Signed   By: Fidela Salisbury M.D.   On: 12/11/2018 18:35    Microbiology: Recent Results (from the past 240 hour(s))  Culture, blood (Routine X 2) w Reflex to ID Panel     Status: None (Preliminary result)   Collection Time: 12/09/18 10:09 AM   Specimen: BLOOD  Result Value Ref Range Status   Specimen Description   Final    BLOOD LEFT ANTECUBITAL Performed at [facility] [address], [place], [place] [address]    Special Requests   Final    BOTTLES DRAWN AEROBIC AND ANAEROBIC Blood Culture adequate volume Performed at [facility] [address], [place], [place] [address]    Culture   Final    NO GROWTH 3 DAYS Performed at [facility], [place] [address] [place], [place] [address]    Report Status PENDING  Incomplete  Culture, blood (Routine X 2) w Reflex to ID Panel     Status: None (Preliminary result)   Collection Time: 12/09/18  2:31 PM   Specimen: BLOOD  Result Value Ref Range Status   Specimen Description   Final    BLOOD RIGHT ANTECUBITAL Performed at [facility] [address]  [place], [place] [address]    Special Requests   Final    BOTTLES DRAWN AEROBIC AND ANAEROBIC Blood Culture adequate volume Performed at [facility] [address] [place], [place] [address]    Culture   Final    NO GROWTH 3 DAYS Performed at [facility], [place] [address], [place], [place] [address]    Report Status PENDING  Incomplete  SARS Coronavirus 2 [facility] order, Performed in [facility] hospital lab) Nasopharyngeal Nasopharyngeal Swab     Status: None   Collection Time: 12/09/18  5:06 PM   Specimen: Nasopharyngeal Swab  Result Value Ref Range Status   SARS Coronavirus 2 NEGATIVE NEGATIVE Final    Comment: (NOTE) If result is NEGATIVE SARS-CoV-2 target nucleic acids are NOT DETECTED. The SARS-CoV-2 RNA is generally detectable in upper and lower  respiratory specimens during the acute phase of infection. The lowest  concentration of SARS-CoV-2 viral copies this assay can detect is 250  copies / mL. A negative result does not preclude SARS-CoV-2 infection  and should not be used as the sole basis for treatment or other  patient management decisions.  A negative result may occur with  improper specimen collection / handling, submission of specimen other  than nasopharyngeal swab, presence of viral mutation(s) within the  areas targeted by this assay, and inadequate number of viral copies  (<250 copies / mL). A negative result must be combined with clinical  observations, patient history, and epidemiological information. If result is POSITIVE SARS-CoV-2 target nucleic acids are DETECTED. The SARS-CoV-2 RNA is generally detectable in upper and lower  respiratory specimens dur ing the acute phase of infection.  Positive  results are indicative of active infection with SARS-CoV-2.  Clinical  correlation with patient history and other diagnostic information is  necessary to determine patient infection status.  Positive results do  not rule out bacterial  infection or co-infection with  other viruses. If result is PRESUMPTIVE POSTIVE SARS-CoV-2 nucleic acids MAY BE PRESENT.   A presumptive positive result was obtained on the submitted specimen  and confirmed on repeat testing.  While 2019 novel coronavirus  (SARS-CoV-2) nucleic acids may be present in the submitted sample  additional confirmatory testing may be necessary for epidemiological  and / or clinical management purposes  to differentiate between  SARS-CoV-2 and other Sarbecovirus currently known to infect humans.  If clinically indicated additional testing with an alternate test  methodology [phone]) is advised. The SARS-CoV-2 RNA is generally  detectable in upper and lower respiratory sp ecimens during the acute  phase of infection. The expected result is Negative. Fact Sheet for Patients:  StrictlyIdeas.no Fact Sheet for Healthcare Providers: BankingDealers.co.za This test is not yet approved or cleared by the [place] FDA and has been authorized for detection and/or diagnosis of SARS-CoV-2 by FDA under an Emergency Use Authorization (EUA).  This EUA will remain in effect (meaning this test can be used) for the duration of the COVID-19 declaration under Section 564(b)(1) of the Act, 21 U.S.C. section 360bbb-3(b)(1), unless the authorization is terminated or revoked sooner. Performed at [facility], [place] [address], [place], [place] [address]   Urine Culture     Status: None   Collection Time: 12/09/18  5:18 PM   Specimen: Urine, Random  Result Value Ref Range Status   Specimen Description   Final    URINE, RANDOM Performed at [facility] [address], [place], [place] [address]    Special Requests   Final    NONE Performed at [facility], [place] [address] [place], [place] [address]    Culture   Final    NO GROWTH Performed at [facility], [place] [address] [place], [place] [address]    Report Status 12/10/2018 FINAL  Final  MRSA PCR Screening     Status: None   Collection Time: 12/10/18  5:52 AM   Specimen: Nasal Mucosa; Nasopharyngeal  Result Value Ref Range Status   MRSA by PCR NEGATIVE NEGATIVE Final    Comment:        The GeneXpert MRSA Assay (FDA approved for NASAL specimens only), is one component of a comprehensive MRSA colonization surveillance program. It is not intended to diagnose MRSA infection nor to guide or monitor treatment for MRSA infections. Performed at [facility], [place] [address], [place],  [address]     Sepsis Labs: Invalid input(s): PROCALCITONIN, LACTICIDVEN  Urine analysis:    Component Value Date/Time   COLORURINE YELLOW 12/09/2018 [place] 12/09/2018 1718   LABSPEC 1.020 12/09/2018 1718   PHURINE 6.0 12/09/2018 1718   GLUCOSEU 150 (A) 12/09/2018 1718   HGBUR NEGATIVE 12/09/2018 1718   BILIRUBINUR MODERATE (A) 12/09/2018 1718   KETONESUR NEGATIVE 12/09/2018 1718   PROTEINUR 30 (A) 12/09/2018 1718   UROBILINOGEN 1.0 12/16/2008 0451   NITRITE NEGATIVE 12/09/2018 1718   LEUKOCYTESUR NEGATIVE 12/09/2018 1718    Anemia Panel: Recent Labs    12/10/18 1244  VITAMINB12 2,861*    Thyroid Function Tests: No results for input(s): TSH, T4TOTAL, FREET4, T3FREE, THYROIDAB in the last 72 hours.  Lipid Profile: No results for input(s): CHOL, HDL, LDLCALC, TRIG, CHOLHDL, LDLDIRECT in the last 72 hours.  CBG: Recent Labs  Lab 12/11/18 2041 12/12/18 0020 12/12/18 0418 12/12/18 0748 12/12/18 1120  GLUCAP 131* 127* 116* 118* 138*    HbA1C: No  results for input(s): HGBA1C in the last 72 hours.  BNP (last 3 results): No results for input(s): PROBNP in the last 8760 hours.  Cardiac Enzymes: No results for input(s): CKTOTAL, CKMB, CKMBINDEX, TROPONINI in the last 168 hours.  Coagulation Profile: No results for input(s): INR, PROTIME in the  last 168 hours.  Liver Function Tests: Recent Labs  Lab 12/08/18 1109 12/09/18 1009 12/11/18 0239 12/12/18 0530  AST 22 32 46* 39  ALT '15 20 30 28  ' ALKPHOS 110 106 89 83  BILITOT 0.8 1.1 1.2 0.9  PROT 6.3* 6.5 6.4* 5.8*  ALBUMIN 2.7* 2.7* 2.7* 2.5*   Recent Labs  Lab 12/09/18 1009  LIPASE 21   Recent Labs  Lab 12/10/18 1244  AMMONIA 26    Basic Metabolic Panel: Recent Labs  Lab 12/08/18 1109 12/09/18 1009 12/09/18 2055 12/10/18 0552 12/11/18 0239 12/12/18 0530  NA 139 136  --  138 139 139  K 3.2* 3.8  --  3.5 3.5 3.2*  CL 103 100  --  103 106 106  CO2 24 24  --  '22 23 24  ' GLUCOSE 108* 147*  --  115* 119* 132*  BUN 6* 10  --  '11 11 13  ' CREATININE 0.82 1.08 1.00 1.00 0.80 0.79  CALCIUM 8.4* 8.2*  --  8.0* 8.5* 8.2*  MG  --   --   --  1.3* 1.6* 2.0  PHOS  --   --   --   --  2.7 2.9   GFR: Estimated Creatinine Clearance: 88.4 mL/min (by C-G formula based on SCr of 0.79 mg/dL).  CBC: Recent Labs  Lab 12/08/18 1109 12/09/18 1009 12/10/18 0552 12/11/18 0239  WBC 4.9 9.6 8.4 5.5  NEUTROABS 3.5 8.8* 7.0  --   HGB 9.7* 10.1* 10.1* 11.1*  HCT 29.0* 31.6* 31.1* 34.1*  MCV 95.4 98.8 97.2 98.0  PLT 219 238 165 224    Procedures:  None  Microbiology summarized: SARS-CoV-2 screening negative.  Assessment & Plan: Sepsis likely due to HCAP in immunocompromised patient on chemotherapy: sepsis physiology resolved.  CXR with multiple changes thought to be related to cancer and post XRT changes per his oncologist.  SARS-CoV-2 negative, UA, blood culture and urine cultures negative.  CT chest/abdomen/pelvis revealed new RLL and RML consolidation.  This is concerning for aspiration. -Change antibiotics to Augmentin and azithromycin -Incentive spirometry and flutter valve -Mild aspiration risk per SLP eval. -OOB  Acute metabolic encephalopathy: Likely due to the above in the setting of polypharmacy.  He is on gabapentin, Percocet, Compazine, tramadol and Benadryl.   No meningeal signs.  TSH, ammonia and vitamin B12 within normal range.  CT head without acute finding.  Encephalopathy resolved.  He is oriented x4.  -Minimize sedating medications. -PT/OT eval  Stage IV non-small cell lung cancer with bone mets (PDL-1 +, ALK, and EGFR -): diagnosed in 08/2018, s/p portacath placement 11/01/2018 , last chemo/immunotherapy on 8/5  -Per admitting provider, oncology Dr Julien Nordmann aware of hospitalization. -CT chest/abdomen/pelvis as above  Fairly controlled IDDM-2: Not in DKA.  A1c 7.0%.  CBG within fair range. -Continue CBG monitoring -Continue SSI.  Hypertension: Normotensive -Continue home meds.  Hypothyroidism: TSH within normal. -Continue home Synthroid.  Hypomagnesemia/hypokalemia:  -Replenish and recheck.  Anemia of chronic disease: Hgb stable -Continue monitoring  Left groin wound: No obvious signs of infection or abscess. -Wound care by RN.  DVT prophylaxis: Subcu Lovenox Code Status: DNR/DNI Family Communication: Updated patient's wife over the phone. Disposition Plan:  Anticipate discharge home in the next 24 hours if he remains stable on p.o. antibiotics. Consultants: Oncology aware of patient's admission.   Antimicrobials: Anti-infectives (From admission, onward)   Start     Dose/Rate Route Frequency Ordered Stop   12/12/18 2200  azithromycin (ZITHROMAX) tablet 500 mg     500 mg Oral Daily at bedtime 12/12/18 0811     12/12/18 1400  metroNIDAZOLE (FLAGYL) tablet 500 mg  Status:  Discontinued     500 mg Oral Every 8 hours 12/12/18 0810 12/12/18 1119   12/12/18 1130  amoxicillin-clavulanate (AUGMENTIN) 875-125 MG per tablet 1 tablet     1 tablet Oral Every 12 hours 12/12/18 1119 12/18/18 0959   12/11/18 2000  azithromycin (ZITHROMAX) 500 mg in sodium chloride 0.9 % 250 mL IVPB  Status:  Discontinued     500 mg 250 mL/hr over 60 Minutes Intravenous Every 24 hours 12/11/18 1849 12/12/18 0811   12/11/18 2000  ceFEPIme (MAXIPIME) 2 g in  sodium chloride 0.9 % 100 mL IVPB  Status:  Discontinued     2 g 200 mL/hr over 30 Minutes Intravenous Every 8 hours 12/11/18 1852 12/12/18 1119   12/11/18 1900  metroNIDAZOLE (FLAGYL) IVPB 500 mg  Status:  Discontinued     500 mg 100 mL/hr over 60 Minutes Intravenous Every 8 hours 12/11/18 1849 12/12/18 0810   12/10/18 1600  vancomycin (VANCOCIN) 1,500 mg in sodium chloride 0.9 % 500 mL IVPB  Status:  Discontinued     1,500 mg 250 mL/hr over 120 Minutes Intravenous Every 24 hours 12/09/18 1906 12/11/18 1205   12/09/18 2200  ceFEPIme (MAXIPIME) 2 g in sodium chloride 0.9 % 100 mL IVPB  Status:  Discontinued     2 g 200 mL/hr over 30 Minutes Intravenous Every 8 hours 12/09/18 1906 12/11/18 1217   12/09/18 1430  vancomycin (VANCOCIN) 1,500 mg in sodium chloride 0.9 % 500 mL IVPB     1,500 mg 250 mL/hr over 120 Minutes Intravenous  Once 12/09/18 1400 12/09/18 1858   12/09/18 1400  ceFEPIme (MAXIPIME) 2 g in sodium chloride 0.9 % 100 mL IVPB     2 g 200 mL/hr over 30 Minutes Intravenous  Once 12/09/18 1352 12/09/18 1608      Sch Meds:  Scheduled Meds:  amLODipine  5 mg Oral Daily   amoxicillin-clavulanate  1 tablet Oral Q12H   aspirin EC  81 mg Oral Daily   atorvastatin  20 mg Oral Daily   azithromycin  500 mg Oral QHS   Chlorhexidine Gluconate Cloth  6 each Topical Daily   enoxaparin  40 mg Subcutaneous T24P   folic acid  1 mg Oral Daily   insulin aspart  0-9 Units Subcutaneous Q4H   levothyroxine  100 mcg Oral Q0600   mouth rinse  15 mL Mouth Rinse BID   metoprolol succinate  50 mg Oral Daily   pantoprazole  40 mg Oral BID   potassium chloride  40 mEq Oral Q4H   ramipril  10 mg Oral Daily   Continuous Infusions:  sodium chloride Stopped (12/12/18 0610)   PRN Meds:.sodium chloride, acetaminophen  Taher Vannote T. [place]  If 7PM-7AM, please contact night-coverage [email or web address] Password [email or web address] 12/12/2018, 11:29 AM

## 2018-12-12 NOTE — Progress Notes (Signed)
Spouse of pt concerned about the left groin abscess that she states is not getting better and the smell of the abscess is getting stronger. Will put in for a wound care consult for tomorrow.

## 2018-12-12 NOTE — Evaluation (Signed)
Physical Therapy Evaluation Patient Details Name: Zachary Knox MRN: [id number] DOB: [birth year] Today's Date: 12/12/2018   History of Present Illness  68 yo male admitted with undifferentiated sepsis with fever 103 tachycardia AMS lactic acidosis on 12/09/18 PMH: HTN, HLD, hypothyroid DM2 lung CA stage IV with bone mets  Clinical Impression  Pt admitted as above and presenting with functional mobility limitations 2* mild gait instability and limited endurance.  Pt should progress to dc home with assist of family.    Follow Up Recommendations No PT follow up    Equipment Recommendations  None recommended by PT    Recommendations for Other Services       Precautions / Restrictions Precautions Precautions: Fall Restrictions Weight Bearing Restrictions: No      Mobility  Bed Mobility Overal bed mobility: Independent                Transfers Overall transfer level: Modified independent                  Ambulation/Gait Ambulation/Gait assistance: Min guard;Supervision Gait Distance (Feet): 240 Feet Assistive device: None Gait Pattern/deviations: Step-through pattern;Shuffle;Wide base of support Gait velocity: decr   General Gait Details: mild instability initially but no LOB; stability improved with distance ambulated.  Pt able to back step and side step without LOB and with good safety awareness  Stairs            Wheelchair Mobility    Modified Rankin (Stroke Patients Only)       Balance Overall balance assessment: Mild deficits observed, not formally tested                                           Pertinent Vitals/Pain Pain Assessment: No/denies pain    Home Living Family/patient expects to be discharged to:: Private residence Living Arrangements: Spouse/significant other Available Help at Discharge: Family Type of Home: House Home Access: Level entry     Home Layout: One level Home Equipment: Other  (comment) Additional Comments: wife states she will be present 24/7    Prior Function Level of Independence: Independent               Hand Dominance   Dominant Hand: Right    Extremity/Trunk Assessment   Upper Extremity Assessment Upper Extremity Assessment: Overall WFL for tasks assessed    Lower Extremity Assessment Lower Extremity Assessment: Overall WFL for tasks assessed    Cervical / Trunk Assessment Cervical / Trunk Assessment: Normal  Communication   Communication: No difficulties  Cognition Arousal/Alertness: Awake/alert Behavior During Therapy: WFL for tasks assessed/performed Overall Cognitive Status: Within Functional Limits for tasks assessed                                        General Comments General comments (skin integrity, edema, etc.): pt with decrease gait velocity but able to demonstrate side stepping walking backward and turning    Exercises     Assessment/Plan    PT Assessment Patient needs continued PT services  PT Problem List Decreased activity tolerance;Decreased balance       PT Treatment Interventions DME instruction;Gait training;Stair training;Functional mobility training;Therapeutic activities;Therapeutic exercise;Patient/family education    PT Goals (Current goals can be found in the Care Plan section)  Acute Rehab PT Goals  Patient Stated Goal: HOME PT Goal Formulation: With patient Time For Goal Achievement: 12/26/18 Potential to Achieve Goals: Good    Frequency Min 3X/week   Barriers to discharge        Co-evaluation PT/OT/SLP Co-Evaluation/Treatment: Yes Reason for Co-Treatment: To address functional/ADL transfers PT goals addressed during session: Mobility/safety with mobility OT goals addressed during session: ADL's and self-care       AM-PAC PT "6 Clicks" Mobility  Outcome Measure Help needed turning from your back to your side while in a flat bed without using bedrails?: None Help  needed moving from lying on your back to sitting on the side of a flat bed without using bedrails?: None Help needed moving to and from a bed to a chair (including a wheelchair)?: None Help needed standing up from a chair using your arms (e.g., wheelchair or bedside chair)?: None Help needed to walk in hospital room?: A Little Help needed climbing 3-5 steps with a railing? : A Little 6 Click Score: 22    End of Session Equipment Utilized During Treatment: Gait belt Activity Tolerance: Patient tolerated treatment well Patient left: in chair;with call bell/phone within reach;with chair alarm set;with family/visitor present Nurse Communication: Mobility status PT Visit Diagnosis: Difficulty in walking, not elsewhere classified (R26.2);Unsteadiness on feet (R26.81)    Time: 2956-2130 PT Time Calculation (min) (ACUTE ONLY): 32 min   Charges:   PT Evaluation $PT Eval Low Complexity: 1 Low          [place] Pager [phone] Office [phone]    Ezell Melikian 12/12/2018, 2:30 PM

## 2018-12-12 NOTE — Progress Notes (Signed)
FYI--spouse requesting a FWW for home prior to d/c.

## 2018-12-12 NOTE — Evaluation (Signed)
Occupational Therapy Evaluation Patient Details Name: Zachary Knox MRN: [id number] DOB: 03/20/1951 Today's Date: 12/12/2018    History of Present Illness 68 yo male admitted with undifferentiated sepsis with fever 103 tachycardia AMS lactic acidosis on 12/09/18 PMH: HTN, HLD, hypothyroid DM2 lung CA stage IV with bone mets   Clinical Impression   Patient evaluated by Occupational Therapy with no further acute OT needs identified. All education has been completed and the patient has no further questions. See below for any follow-up Occupational Therapy or equipment needs. OT to sign off. Thank you for referral.   Wife present in room on the cell phone face time the entire session without mask on.     Follow Up Recommendations  No OT follow up    Equipment Recommendations  None recommended by OT    Recommendations for Other Services       Precautions / Restrictions Precautions Precautions: Fall      Mobility Bed Mobility Overal bed mobility: Independent                Transfers Overall transfer level: Modified independent                    Balance                                           ADL either performed or assessed with clinical judgement   ADL Overall ADL's : Modified independent                                       General ADL Comments: pt demonstrates ability to complete shower transfer with simulation this session. pt asking about foley for d/ cand wife states "No you wont get up if they give you that'      Vision Patient Visual Report: No change from baseline Vision Assessment?: No apparent visual deficits     Perception     Praxis      Pertinent Vitals/Pain Pain Assessment: No/denies pain     Hand Dominance Right   Extremity/Trunk Assessment Upper Extremity Assessment Upper Extremity Assessment: Overall WFL for tasks assessed   Lower Extremity Assessment Lower Extremity Assessment: Defer  to PT evaluation   Cervical / Trunk Assessment Cervical / Trunk Assessment: Normal   Communication Communication Communication: No difficulties   Cognition Arousal/Alertness: Awake/alert Behavior During Therapy: WFL for tasks assessed/performed Overall Cognitive Status: Within Functional Limits for tasks assessed                                     General Comments  pt with decrease gait velocity but able to demonstrate side stepping walking backward and turning    Exercises     Shoulder Instructions      Home Living Family/patient expects to be discharged to:: Private residence Living Arrangements: Spouse/significant other Available Help at Discharge: Family Type of Home: House Home Access: Level entry     Home Layout: One level     Bathroom Shower/Tub: Occupational psychologist: Handicapped height     Home Equipment: Other (comment)(walking stick only for outside the house)   Additional Comments: wife states she will be present 24/7  Prior Functioning/Environment Level of Independence: Independent                 OT Problem List:        OT Treatment/Interventions:      OT Goals(Current goals can be found in the care plan section) Acute Rehab OT Goals OT Goal Formulation: With patient Time For Goal Achievement: 12/26/18 Potential to Achieve Goals: Good  OT Frequency:     Barriers to D/C:            Co-evaluation PT/OT/SLP Co-Evaluation/Treatment: Yes Reason for Co-Treatment: To address functional/ADL transfers   OT goals addressed during session: ADL's and self-care      AM-PAC OT "6 Clicks" Daily Activity     Outcome Measure Help from another person eating meals?: None Help from another person taking care of personal grooming?: None Help from another person toileting, which includes using toliet, bedpan, or urinal?: None Help from another person bathing (including washing, rinsing, drying)?: None Help from  another person to put on and taking off regular upper body clothing?: None Help from another person to put on and taking off regular lower body clothing?: None 6 Click Score: 24   End of Session Equipment Utilized During Treatment: Gait belt Nurse Communication: Mobility status;Precautions  Activity Tolerance: Patient tolerated treatment well Patient left: in chair;with call bell/phone within reach;with chair alarm set;with family/visitor present  OT Visit Diagnosis: Unsteadiness on feet (R26.81)                Time: 7829-5621 OT Time Calculation (min): 18 min Charges:  OT General Charges $OT Visit: 1 Visit OT Evaluation $OT Eval Moderate Complexity: 1 Mod   Jeri Modena, OTR/L  Acute Rehabilitation Services Pager: [phone] Office: [phone] .   Jeri Modena 12/12/2018, 12:16 PM

## 2018-12-13 LAB — CBC
HCT: 28.3 % — ABNORMAL LOW (ref 39.0–52.0)
Hemoglobin: 9.3 g/dL — ABNORMAL LOW (ref 13.0–17.0)
MCH: 32.2 pg (ref 26.0–34.0)
MCHC: 32.9 g/dL (ref 30.0–36.0)
MCV: 97.9 fL (ref 80.0–100.0)
Platelets: 161 10*3/uL (ref 150–400)
RBC: 2.89 MIL/uL — ABNORMAL LOW (ref 4.22–5.81)
RDW: 14.6 % (ref 11.5–15.5)
WBC: 2.7 10*3/uL — ABNORMAL LOW (ref 4.0–10.5)
nRBC: 0 % (ref 0.0–0.2)

## 2018-12-13 LAB — GLUCOSE, CAPILLARY
Glucose-Capillary: 110 mg/dL — ABNORMAL HIGH (ref 70–99)
Glucose-Capillary: 113 mg/dL — ABNORMAL HIGH (ref 70–99)
Glucose-Capillary: 118 mg/dL — ABNORMAL HIGH (ref 70–99)

## 2018-12-13 LAB — BASIC METABOLIC PANEL
Anion gap: 7 (ref 5–15)
BUN: 11 mg/dL (ref 8–23)
CO2: 22 mmol/L (ref 22–32)
Calcium: 8.2 mg/dL — ABNORMAL LOW (ref 8.9–10.3)
Chloride: 109 mmol/L (ref 98–111)
Creatinine, Ser: 0.79 mg/dL (ref 0.61–1.24)
GFR calc Af Amer: >60 mL/min (ref >60)
GFR calc non Af Amer: >60 mL/min (ref >60)
Glucose, Bld: 127 mg/dL — ABNORMAL HIGH (ref 70–99)
Potassium: 3.6 mmol/L (ref 3.5–5.1)
Sodium: 138 mmol/L (ref 135–145)

## 2018-12-13 MED ORDER — AMOXICILLIN-POT CLAVULANATE 875-125 MG PO TABS: 1.0000 | ORAL_TABLET | Freq: Two times a day (BID) | ORAL | 0 refills | Status: AC

## 2018-12-13 MED ORDER — AZITHROMYCIN 250 MG PO TABS: ORAL_TABLET | ORAL | 0 refills | Status: AC

## 2018-12-13 NOTE — Progress Notes (Signed)
HEMATOLOGY-ONCOLOGY PROGRESS NOTE  SUBJECTIVE: Feels better. Breathing improved. Denies fevers and chills. No cough or hemoptysis. Hoping to go home later today.   Oncology History  Adenocarcinoma of right lung, stage 4 ([place])  08/19/2018 Initial Diagnosis   Adenocarcinoma of right lung, stage 4 ([place])   08/24/2018 - 09/27/2018 Chemotherapy   The patient had palonosetron (ALOXI) injection 0.25 mg, 0.25 mg, Intravenous,  Once, 4 of 5 cycles Administration: 0.25 mg (08/24/2018), 0.25 mg (08/30/2018), 0.25 mg (09/06/2018), 0.25 mg (09/13/2018) CARBOplatin (PARAPLATIN) 210 mg in sodium chloride 0.9 % 250 mL chemo infusion, 210 mg (100 % of original dose 205 mg), Intravenous,  Once, 4 of 5 cycles Dose modification: 205 mg (original dose 205 mg, Cycle 1) Administration: 210 mg (08/24/2018), 210 mg (08/30/2018), 210 mg (09/06/2018), 210 mg (09/13/2018) PACLitaxel (TAXOL) 90 mg in sodium chloride 0.9 % 250 mL chemo infusion (</= 80mg /m2), 45 mg/m2 = 90 mg, Intravenous,  Once, 4 of 5 cycles Administration: 90 mg (08/24/2018), 90 mg (08/30/2018), 90 mg (09/06/2018), 90 mg (09/13/2018)  for chemotherapy treatment.    10/27/2018 -  Chemotherapy   The patient had palonosetron (ALOXI) injection 0.25 mg, 0.25 mg, Intravenous,  Once, 3 of 4 cycles Administration: 0.25 mg (10/27/2018), 0.25 mg (12/08/2018), 0.25 mg (11/17/2018) PEMEtrexed (ALIMTA) 1,000 mg in sodium chloride 0.9 % 100 mL chemo infusion, 975 mg, Intravenous,  Once, 3 of 6 cycles Administration: 1,000 mg (10/27/2018), 1,000 mg (12/08/2018), 1,000 mg (11/17/2018) CARBOplatin (PARAPLATIN) 520 mg in sodium chloride 0.9 % 250 mL chemo infusion, 520 mg (100 % of original dose 515.5 mg), Intravenous,  Once, 3 of 4 cycles Dose modification: 515.5 mg (original dose 515.5 mg, Cycle 1), 519.5 mg (original dose 519.5 mg, Cycle 3), 415.6 mg (original dose 519.5 mg, Cycle 3, Reason: Dose not tolerated), 519.5 mg (original dose 519.5 mg, Cycle 2) Administration: 520 mg (10/27/2018),  420 mg (12/08/2018), 520 mg (11/17/2018) pembrolizumab (KEYTRUDA) 200 mg in sodium chloride 0.9 % 50 mL chemo infusion, 200 mg, Intravenous, Once, 3 of 6 cycles Administration: 200 mg (10/27/2018), 200 mg (12/08/2018), 200 mg (11/17/2018) fosaprepitant (EMEND) 150 mg, dexamethasone (DECADRON) 12 mg in sodium chloride 0.9 % 145 mL IVPB, , Intravenous,  Once, 3 of 4 cycles Administration:  (10/27/2018),  (12/08/2018),  (11/17/2018)  for chemotherapy treatment.       REVIEW OF SYSTEMS:   Constitutional: Denies fevers, chills Eyes: Denies blurriness of vision Ears, nose, mouth, throat, and face: Denies mucositis or sore throat Respiratory: shortness of breath improved Cardiovascular: Denies palpitation, chest discomfort Gastrointestinal:  Denies nausea, heartburn or change in bowel habits Skin: Denies abnormal skin rashes Lymphatics: Denies new lymphadenopathy or easy bruising Neurological:Denies numbness, tingling or new weaknesses Behavioral/Psych: Mood is stable, no new changes  Extremities: No lower extremity edema All other systems were reviewed with the patient and are negative.  I have reviewed the past medical history, past surgical history, social history and family history with the patient and they are unchanged from previous note.   PHYSICAL EXAMINATION: ECOG PERFORMANCE STATUS: 1 - Symptomatic but completely ambulatory  Vitals:   12/12/18 1950 12/13/18 0407  BP: 100/75 95/74  Pulse: 84 92  Resp: 20 19  Temp: (!) 97.5 F (36.4 C) 98 F (36.7 C)  SpO2: 98% 98%   Filed Weights   12/10/18 0500  Weight: 171 lb 1.2 oz (77.6 kg)    Intake/Output from previous day: 08/09 0701 - 08/10 0700 In: 680 [P.O.:680] Out: 1550 [Urine:1550]  GENERAL:alert, no distress and  comfortable SKIN: skin color, texture, turgor are normal, no rashes or significant lesions EYES: normal, Conjunctiva are pink and non-injected, sclera clear OROPHARYNX:no exudate, no erythema and lips, buccal mucosa,  and tongue normal  NECK: supple, thyroid normal size, non-tender, without nodularity LYMPH:  no palpable lymphadenopathy in the cervical, axillary or inguinal LUNGS: clear to auscultation and percussion with normal breathing effort HEART: regular rate & rhythm and no murmurs and no lower extremity edema ABDOMEN:abdomen soft, non-tender and normal bowel sounds Musculoskeletal:no cyanosis of digits and no clubbing  NEURO: alert & oriented x 3 with fluent speech, no focal motor/sensory deficits  LABORATORY DATA:  I have reviewed the data as listed CMP Latest Ref Rng & Units 12/13/2018 12/12/2018 12/11/2018  Glucose 70 - 99 mg/dL 127(H) 132(H) 119(H)  BUN 8 - 23 mg/dL 11 13 11   Creatinine 0.61 - 1.24 mg/dL 0.79 0.79 0.80  Sodium 135 - 145 mmol/L 138 139 139  Potassium 3.5 - 5.1 mmol/L 3.6 3.2(L) 3.5  Chloride 98 - 111 mmol/L 109 106 106  CO2 22 - 32 mmol/L 22 24 23   Calcium 8.9 - 10.3 mg/dL 8.2(L) 8.2(L) 8.5(L)  Total Protein 6.5 - 8.1 g/dL - 5.8(L) 6.4(L)  Total Bilirubin 0.3 - 1.2 mg/dL - 0.9 1.2  Alkaline Phos 38 - 126 U/L - 83 89  AST 15 - 41 U/L - 39 46(H)  ALT 0 - 44 U/L - 28 30    Lab Results  Component Value Date   WBC 2.7 (L) 12/13/2018   HGB 9.3 (L) 12/13/2018   HCT 28.3 (L) 12/13/2018   MCV 97.9 12/13/2018   PLT 161 12/13/2018   NEUTROABS 7.0 12/10/2018    Dg Chest 2 View  Result Date: 12/09/2018 CLINICAL DATA:  History of lung cancer. Increased weakness and shortness of breath. EXAM: CHEST - 2 VIEW COMPARISON:  CT scan October 12, 2018.  Chest x-ray November 27, 2018. FINDINGS: No pneumothorax. The right Port-A-Cath is stable, terminating just in the right side of the atrium. The left lung remains clear. Opacity projected over the right hilum on the frontal view and located posteriorly on the lateral view is more prominent in the interval. There is also mild increased opacity more peripherally in the lateral right lung base. No other interval changes. IMPRESSION: 1. Opacity in the  region of the patient's known malignancy is more prominent in the interval. While the finding may be at least partially due to difference in technique, disease worsening or a superimposed infiltrate are both possible. There is also increased opacity peripheral to the site of the patient's known malignancy in the lateral right lung base. The increased conspicuity could be due to lower lung volumes versus worsening infiltrate. Electronically Signed   By: Dorise Bullion III M.D   On: 12/09/2018 09:49   Dg Chest 2 View  Result Date: 11/27/2018 CLINICAL DATA:  Chest pain. History of lung cancer. EXAM: CHEST - 2 VIEW COMPARISON:  Chest x-rays dated 08/16/2018 and 08/10/2018 and chest CT dated 10/12/2018 FINDINGS: Power port in place with the tip at the cavoatrial junction. Heart size and vascularity are normal. There is increased density in the posterior aspect of the right lower lobe in the area of the patient's known tumor. There is increased thickening along the fissures in the midzone at the right base laterally. The left lung is clear. No effusions. No bone abnormality. IMPRESSION: 1. Slight density in the posterior aspect of the right lower lobe in the area of the patient's  known tumor. 2. Increased thickening of the fissures on the right. Electronically Signed   By: Lorriane Shire M.D.   On: 11/27/2018 15:49   Ct Head Wo Contrast  Result Date: 12/09/2018 CLINICAL DATA:  Altered level of consciousness.  Fever. EXAM: CT HEAD WITHOUT CONTRAST TECHNIQUE: Contiguous axial images were obtained from the base of the skull through the vertex without intravenous contrast. COMPARISON:  None. FINDINGS: Brain: The ventricles are normal in size and configuration for age. There is no intracranial mass, hemorrhage, extra-axial fluid collection, or midline shift. The brain parenchyma appears unremarkable. No acute infarct evident. Vascular: No hyperdense vessel. There are foci of vascular calcification in each carotid  siphon. Skull: Bony calvarium appears intact. Sinuses/Orbits: Visualized paranasal sinuses are clear. Orbits appear symmetric bilaterally. Other: Mastoid air cells are clear. IMPRESSION: There are foci of arterial vascular calcification. Brain parenchyma appears unremarkable. No acute infarct. No evident mass or hemorrhage. Electronically Signed   By: Lowella Grip III M.D.   On: 12/09/2018 09:50   Ct Chest W Contrast  Result Date: 12/11/2018 CLINICAL DATA:  Restaging lung cancer. Initial diagnosis March 2020. EXAM: CT CHEST, ABDOMEN, AND PELVIS WITH CONTRAST TECHNIQUE: Multidetector CT imaging of the chest, abdomen and pelvis was performed following the standard protocol during bolus administration of intravenous contrast. CONTRAST:  36mL OMNIPAQUE IOHEXOL 300 MG/ML SOLN, 179mL OMNIPAQUE IOHEXOL 300 MG/ML SOLN COMPARISON:  October 12, 2018 FINDINGS: CT CHEST FINDINGS Cardiovascular: Injectable port terminates at the cavoatrial junction. Normal heart size. Minimal pericardial effusion. Calcific atherosclerotic disease of the coronary arteries. Mild calcific atherosclerotic disease of the aorta. No central pulmonary embolus. Mediastinum/Nodes: No enlarged mediastinal, hilar, or axillary lymph nodes. Thyroid gland, trachea, and esophagus demonstrate no significant findings. Right precarinal lymph node measures 9 mm in short axis, sub pathologic by CT criteria. Lungs/Pleura: Marked upper lobe predominant paraseptal emphysema. Interval development of near complete opacification of the superior segment of the right lower lobe, which obscures the previously demonstrated subpleural pulmonary nodule. Spiculated peribronchial airspace opacity is now seen in the right middle lobe. Air bronchograms within these abnormalities suggest consolidation. Musculoskeletal: No chest wall mass or suspicious bone lesions identified. CT ABDOMEN PELVIS FINDINGS Hepatobiliary: No focal liver abnormality is seen. No gallstones, gallbladder  wall thickening, or biliary dilatation. Pancreas: Unremarkable. No pancreatic ductal dilatation or surrounding inflammatory changes. Spleen: Normal in size without focal abnormality. Adrenals/Urinary Tract: Adrenal glands are unremarkable. Kidneys are normal, without renal calculi, focal lesion, or hydronephrosis. Right renal cyst. Bladder is unremarkable. Stomach/Bowel: Stomach is within normal limits. Appendix appears normal. No evidence of bowel wall thickening, distention, or inflammatory changes. Vascular/Lymphatic: Aortic atherosclerosis. No enlarged abdominal or pelvic lymph nodes. 8 mm nonspecific central mesenteric lymph node, image 87/136, sequence 2. Reproductive: The prostate is nonenlarged. Other: No abdominal wall hernia or abnormality. No abdominopelvic ascites. Musculoskeletal: Healing fracture of the right posterior eighth rib within an area of sclerosis, likely pathologic fracture within a metastatic lesion. No new lytic or sclerotic lesions. IMPRESSION: 1. Interval development of airspace consolidation with near complete opacification of the superior segment of the right lower lobe, which obscures the previously demonstrated subpleural pulmonary malignancy. 2. Interval development of additional peribronchial airspace consolidation in the right middle lobe. 3. No evidence of metastatic disease to the abdomen or pelvis. 4. Healing pathologic fracture of the right posterior eighth rib. Aortic Atherosclerosis (ICD10-I70.0) and Emphysema (ICD10-J43.9). Electronically Signed   By: Fidela Salisbury M.D.   On: 12/11/2018 18:35   Ct Abdomen Pelvis W  Contrast  Result Date: 12/11/2018 CLINICAL DATA:  Restaging lung cancer. Initial diagnosis March 2020. EXAM: CT CHEST, ABDOMEN, AND PELVIS WITH CONTRAST TECHNIQUE: Multidetector CT imaging of the chest, abdomen and pelvis was performed following the standard protocol during bolus administration of intravenous contrast. CONTRAST:  31mL OMNIPAQUE IOHEXOL  300 MG/ML SOLN, 164mL OMNIPAQUE IOHEXOL 300 MG/ML SOLN COMPARISON:  October 12, 2018 FINDINGS: CT CHEST FINDINGS Cardiovascular: Injectable port terminates at the cavoatrial junction. Normal heart size. Minimal pericardial effusion. Calcific atherosclerotic disease of the coronary arteries. Mild calcific atherosclerotic disease of the aorta. No central pulmonary embolus. Mediastinum/Nodes: No enlarged mediastinal, hilar, or axillary lymph nodes. Thyroid gland, trachea, and esophagus demonstrate no significant findings. Right precarinal lymph node measures 9 mm in short axis, sub pathologic by CT criteria. Lungs/Pleura: Marked upper lobe predominant paraseptal emphysema. Interval development of near complete opacification of the superior segment of the right lower lobe, which obscures the previously demonstrated subpleural pulmonary nodule. Spiculated peribronchial airspace opacity is now seen in the right middle lobe. Air bronchograms within these abnormalities suggest consolidation. Musculoskeletal: No chest wall mass or suspicious bone lesions identified. CT ABDOMEN PELVIS FINDINGS Hepatobiliary: No focal liver abnormality is seen. No gallstones, gallbladder wall thickening, or biliary dilatation. Pancreas: Unremarkable. No pancreatic ductal dilatation or surrounding inflammatory changes. Spleen: Normal in size without focal abnormality. Adrenals/Urinary Tract: Adrenal glands are unremarkable. Kidneys are normal, without renal calculi, focal lesion, or hydronephrosis. Right renal cyst. Bladder is unremarkable. Stomach/Bowel: Stomach is within normal limits. Appendix appears normal. No evidence of bowel wall thickening, distention, or inflammatory changes. Vascular/Lymphatic: Aortic atherosclerosis. No enlarged abdominal or pelvic lymph nodes. 8 mm nonspecific central mesenteric lymph node, image 87/136, sequence 2. Reproductive: The prostate is nonenlarged. Other: No abdominal wall hernia or abnormality. No  abdominopelvic ascites. Musculoskeletal: Healing fracture of the right posterior eighth rib within an area of sclerosis, likely pathologic fracture within a metastatic lesion. No new lytic or sclerotic lesions. IMPRESSION: 1. Interval development of airspace consolidation with near complete opacification of the superior segment of the right lower lobe, which obscures the previously demonstrated subpleural pulmonary malignancy. 2. Interval development of additional peribronchial airspace consolidation in the right middle lobe. 3. No evidence of metastatic disease to the abdomen or pelvis. 4. Healing pathologic fracture of the right posterior eighth rib. Aortic Atherosclerosis (ICD10-I70.0) and Emphysema (ICD10-J43.9). Electronically Signed   By: Fidela Salisbury M.D.   On: 12/11/2018 18:35    ASSESSMENT AND PLAN: 1. Stage IV non small cell lung cancer, adenocarcinoma 2. Sepsis secondary to HCAP vs. Aspiration pneumonia 3.  Acute metabolic encephalopathy, resolved 4.  Anemia due to chronic disease and recent chemotherapy 5.  Neutropenia due to recent chemotherapy  -Recent CT scans reviewed. Findings consistent with aspiration pneumonia. Lung mass not clearly visualized. Will plan for outpatient follow-up of his lung cancer.  -Antibiotics per hospitalist -Will monitor CBC weekly and transfuse as needed. No transfusion indicated today.   He has outpatient follow-up at the [facility] already scheduled. He will keep these appointments.    LOS: 4 days   Mikey Bussing, DNP, AGPCNP-BC, AOCNP 12/13/18

## 2018-12-13 NOTE — Progress Notes (Signed)
Reviewed discharge paperwork, medication regimen, & follow up appointments with patient. He was accompanied out by NT.

## 2018-12-13 NOTE — Discharge Summary (Signed)
Physician Discharge Summary  Zachary Knox HYI:[id number] DOB: 1950/10/26 DOA: 12/09/2018  PCP: Vincente Liberty, MD  Admit date: 12/09/2018 Discharge date: 12/13/2018  Admitted From: Home Disposition: Home  Recommendations for Outpatient Follow-up:  1. Follow up with PCP in 1-2 weeks 2. Please obtain CBC/BMP/Mag at follow up 3. 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  Found to have undifferentiated sepsis with fever to 103, tachycardia, AMS and lactic acidosis and admitted on 12/09/2018.  Started on broad-spectrum antibiotic with vancomycin and cefepime.  Blood and urine cultures negative.  Fever resolved.  Mental status improved.  Restraints discontinued.  CT chest/abdomen/pelvis obtained on 12/11/2018 and revealed new consolidation with near complete opacification of superior segment of RLL obscuring previously demonstrated subpleural pulmonary malignancy, and new peribronchial RML consolidation but no metastatic disease to abdomen or pelvis.  Patient continued on cefepime.    Added Flagyl and azithromycin.  Patient was finally transitioned to Augmentin and azithromycin and remained stable.  Evaluated by SLP/PT/OT, and no need was identified.  Discharged home in stable condition with Augmentin and azithromycin to complete antibiotic course.  See individual problem list below for more.  Discharge Diagnoses:  Sepsis likely due to HCAP in immunocompromised patient on chemotherapy: sepsis physiology resolved.  CXR with multiple changes thought to be related to cancer  and post XRT changes per his oncologist.  SARS-CoV-2 negative, UA, blood culture and urine cultures negative.  CT chest/abdomen/pelvis revealed new RLL and RML consolidation concerning for aspiration pneumonia.  Initially treated with cefepime, vancomycin and Flagyl, and transitioned to Augmentin and azithromycin. -Discharge home on Augmentin and azithromycin to complete course. -Evaluated by SLP/PT/OT and no need was identified.  Acute metabolic encephalopathy: Likely due to the above in the setting of polypharmacy.  He is on gabapentin, Percocet, Compazine, tramadol and Benadryl.  No meningeal signs.  TSH, ammonia and vitamin B12 within normal range.  CT head without acute finding.  Encephalopathy resolved.  He is oriented x4.   Discontinued oxycodone, tramadol and Benadryl -Discharge on home Augmentin. -No need identified after evaluation by PT/OT/SLP.  Left groin wound: Patient has a small wound about 1 cm in diameter in left groin which appears to be healing. No surrounding skin erythema or fluctuance.  No pus drainage when excused. -Recommended washing the area with soap and water twice a day and keeping the area clean and dry. -May apply some gauze. 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  Fairly controlled IDDM-2: Not in DKA.  A1c 7.0%.  CBG within fair range. -Discharge on home medications.  Hypertension: Normotensive -Continue home meds.  Hypothyroidism: TSH within normal. -Continue home Synthroid.  Hypomagnesemia/hypokalemia: Resolved.  Anemia of chronic disease/leukopenia: Trilinear drop in his cell counts likely from dilution, blood draws and cancer therapy.  No signs of bleeding.  Not at the point to need transfusion. -Recheck CBC at  follow-up. -Oncology follow-up   Discharge Instructions  Discharge Instructions    Call MD for:  persistant dizziness or light-headedness  Complete by: As directed    Call MD for:  persistant nausea and vomiting   Complete by: As directed    Call MD for:  redness, tenderness, or signs of infection (pain, swelling, redness, odor or green/yellow discharge around incision site)   Complete by: As directed    Call MD for:  severe uncontrolled pain   Complete by: As directed    Call MD for:  temperature >100.4   Complete by: As directed    Diet - low sodium heart healthy   Complete by: As directed    Diet Carb Modified   Complete by: As directed    Discharge instructions   Complete by: As directed    It has been a pleasure taking care of you! You were admitted with fever and altered mental status likely due to pneumonia and some of your home medications which could contribute.  We treated you with antibiotic for pneumonia.  We also made adjustments to your home medications.  With that, your symptoms improved to the point we think it is safe to let you go home and follow-up with your primary care doctor.  We are discharging you on more antibiotics to continue taking at home.  We have also made some changes to your home medication. In regards to your groin wound, we recommend cleaning the area twice a day with water and soap and keeping the area clean and dry.  You may apply a small gauze to keep this area covered and dry. Please review your new medication list and the directions before you take your medications. Please call your primary care office as soon as possible to schedule hospital follow-up visit in 1 to 2 weeks. Continue follow-up with your oncologist.  Take care,   Increase activity slowly   Complete by: As directed      Allergies as of 12/13/2018   No Known Allergies     Medication List    STOP taking these medications   diphenhydrAMINE 25 MG tablet Commonly known as:  BENADRYL   ibuprofen 200 MG tablet Commonly known as: ADVIL   oxyCODONE-acetaminophen 5-325 MG tablet Commonly known as: PERCOCET/ROXICET   traMADol 50 MG tablet Commonly known as: ULTRAM     TAKE these medications   acetaminophen 500 MG tablet Commonly known as: TYLENOL Take 1,000 mg by mouth every 6 (six) hours as needed for mild pain.   amLODipine 5 MG tablet Commonly known as: NORVASC Take 5 mg by mouth daily.   amoxicillin-clavulanate 875-125 MG tablet Commonly known as: AUGMENTIN Take 1 tablet by mouth every 12 (twelve) hours.   aspirin EC 81 MG tablet Take 81 mg by mouth daily.   atorvastatin 20 MG tablet Commonly known as: LIPITOR Take 20 mg by mouth daily.   azithromycin 250 MG tablet Commonly known as: ZITHROMAX Take two tablets (500 mg) tongiht   folic acid 1 MG tablet Commonly known as: FOLVITE Take 1 tablet (1 mg total) by mouth daily.   gabapentin 400 MG capsule Commonly known as: NEURONTIN Take 400 mg by mouth at bedtime.   levothyroxine 100 MCG tablet Commonly known as: SYNTHROID Take 100 mcg by mouth daily.   lidocaine 2 % solution Commonly known as: XYLOCAINE Use as directed 15 mLs in the mouth or throat every 6 (six) hours as needed (mouth/throat pain). Swish and swallow   metFORMIN 1000 MG tablet Commonly known as: GLUCOPHAGE Take 500-1,000 mg by mouth See admin instructions. Take 1000 mg every morning and 500  mg at lunch and supper   metoprolol succinate 100 MG 24 hr tablet Commonly known as: TOPROL-XL Take 100 mg by mouth daily. Take with or immediately following a meal.   pantoprazole 40 MG tablet Commonly known as: PROTONIX Take 40 mg by mouth 2 (two) times daily.   prochlorperazine 10 MG tablet Commonly known as: COMPAZINE Take 1 tablet (10 mg total) by mouth every 6 (six) hours as needed for nausea or vomiting.   ramipril 10 MG capsule Commonly known as: ALTACE Take 10 mg by mouth daily.   sucralfate 1 GM/10ML  suspension Commonly known as: Carafate Take 10 mLs (1 g total) by mouth 3 (three) times daily as needed. What changed: when to take this   Toujeo SoloStar 300 UNIT/ML Sopn Generic drug: Insulin Glargine (1 Unit Dial) Inject 18 Units into the skin every morning.      Follow-up Information    Vincente Liberty, MD. Schedule an appointment as soon as possible for a visit in 1 week(s).   Specialty: Pulmonary Disease Contact information: [place] [address] [phone]           Consultations:  Oncology over the phone  Procedures/Studies:  2D Echo: None  Dg Chest 2 View  Result Date: 12/09/2018 CLINICAL DATA:  History of lung cancer. Increased weakness and shortness of breath. EXAM: CHEST - 2 VIEW COMPARISON:  CT scan October 12, 2018.  Chest x-ray November 27, 2018. FINDINGS: No pneumothorax. The right Port-A-Cath is stable, terminating just in the right side of the atrium. The left lung remains clear. Opacity projected over the right hilum on the frontal view and located posteriorly on the lateral view is more prominent in the interval. There is also mild increased opacity more peripherally in the lateral right lung base. No other interval changes. IMPRESSION: 1. Opacity in the region of the patient's known malignancy is more prominent in the interval. While the finding may be at least partially due to difference in technique, disease worsening or a superimposed infiltrate are both possible. There is also increased opacity peripheral to the site of the patient's known malignancy in the lateral right lung base. The increased conspicuity could be due to lower lung volumes versus worsening infiltrate. Electronically Signed   By: Dorise Bullion III M.D   On: 12/09/2018 09:49   Dg Chest 2 View  Result Date: 11/27/2018 CLINICAL DATA:  Chest pain. History of lung cancer. EXAM: CHEST - 2 VIEW COMPARISON:  Chest x-rays dated 08/16/2018 and 08/10/2018 and chest CT dated  10/12/2018 FINDINGS: Power port in place with the tip at the cavoatrial junction. Heart size and vascularity are normal. There is increased density in the posterior aspect of the right lower lobe in the area of the patient's known tumor. There is increased thickening along the fissures in the midzone at the right base laterally. The left lung is clear. No effusions. No bone abnormality. IMPRESSION: 1. Slight density in the posterior aspect of the right lower lobe in the area of the patient's known tumor. 2. Increased thickening of the fissures on the right. Electronically Signed   By: Lorriane Shire M.D.   On: 11/27/2018 15:49   Ct Head Wo Contrast  Result Date: 12/09/2018 CLINICAL DATA:  Altered level of consciousness.  Fever. EXAM: CT HEAD WITHOUT CONTRAST TECHNIQUE: Contiguous axial images were obtained from the base of the skull through the vertex without intravenous contrast. COMPARISON:  None. FINDINGS: Brain: The ventricles are normal in size and  configuration for age. There is no intracranial mass, hemorrhage, extra-axial fluid collection, or midline shift. The brain parenchyma appears unremarkable. No acute infarct evident. Vascular: No hyperdense vessel. There are foci of vascular calcification in each carotid siphon. Skull: Bony calvarium appears intact. Sinuses/Orbits: Visualized paranasal sinuses are clear. Orbits appear symmetric bilaterally. Other: Mastoid air cells are clear. IMPRESSION: There are foci of arterial vascular calcification. Brain parenchyma appears unremarkable. No acute infarct. No evident mass or hemorrhage. Electronically Signed   By: Lowella Grip III M.D.   On: 12/09/2018 09:50   Ct Chest W Contrast  Result Date: 12/11/2018 CLINICAL DATA:  Restaging lung cancer. Initial diagnosis March 2020. EXAM: CT CHEST, ABDOMEN, AND PELVIS WITH CONTRAST TECHNIQUE: Multidetector CT imaging of the chest, abdomen and pelvis was performed following the standard protocol during bolus  administration of intravenous contrast. CONTRAST:  63m OMNIPAQUE IOHEXOL 300 MG/ML SOLN, 1059mOMNIPAQUE IOHEXOL 300 MG/ML SOLN COMPARISON:  October 12, 2018 FINDINGS: CT CHEST FINDINGS Cardiovascular: Injectable port terminates at the cavoatrial junction. Normal heart size. Minimal pericardial effusion. Calcific atherosclerotic disease of the coronary arteries. Mild calcific atherosclerotic disease of the aorta. No central pulmonary embolus. Mediastinum/Nodes: No enlarged mediastinal, hilar, or axillary lymph nodes. Thyroid gland, trachea, and esophagus demonstrate no significant findings. Right precarinal lymph node measures 9 mm in short axis, sub pathologic by CT criteria. Lungs/Pleura: Marked upper lobe predominant paraseptal emphysema. Interval development of near complete opacification of the superior segment of the right lower lobe, which obscures the previously demonstrated subpleural pulmonary nodule. Spiculated peribronchial airspace opacity is now seen in the right middle lobe. Air bronchograms within these abnormalities suggest consolidation. Musculoskeletal: No chest wall mass or suspicious bone lesions identified. CT ABDOMEN PELVIS FINDINGS Hepatobiliary: No focal liver abnormality is seen. No gallstones, gallbladder wall thickening, or biliary dilatation. Pancreas: Unremarkable. No pancreatic ductal dilatation or surrounding inflammatory changes. Spleen: Normal in size without focal abnormality. Adrenals/Urinary Tract: Adrenal glands are unremarkable. Kidneys are normal, without renal calculi, focal lesion, or hydronephrosis. Right renal cyst. Bladder is unremarkable. Stomach/Bowel: Stomach is within normal limits. Appendix appears normal. No evidence of bowel wall thickening, distention, or inflammatory changes. Vascular/Lymphatic: Aortic atherosclerosis. No enlarged abdominal or pelvic lymph nodes. 8 mm nonspecific central mesenteric lymph node, image 87/136, sequence 2. Reproductive: The prostate is  nonenlarged. Other: No abdominal wall hernia or abnormality. No abdominopelvic ascites. Musculoskeletal: Healing fracture of the right posterior eighth rib within an area of sclerosis, likely pathologic fracture within a metastatic lesion. No new lytic or sclerotic lesions. IMPRESSION: 1. Interval development of airspace consolidation with near complete opacification of the superior segment of the right lower lobe, which obscures the previously demonstrated subpleural pulmonary malignancy. 2. Interval development of additional peribronchial airspace consolidation in the right middle lobe. 3. No evidence of metastatic disease to the abdomen or pelvis. 4. Healing pathologic fracture of the right posterior eighth rib. Aortic Atherosclerosis (ICD10-I70.0) and Emphysema (ICD10-J43.9). Electronically Signed   By: DoFidela Salisbury.D.   On: 12/11/2018 18:35   Ct Abdomen Pelvis W Contrast  Result Date: 12/11/2018 CLINICAL DATA:  Restaging lung cancer. Initial diagnosis March 2020. EXAM: CT CHEST, ABDOMEN, AND PELVIS WITH CONTRAST TECHNIQUE: Multidetector CT imaging of the chest, abdomen and pelvis was performed following the standard protocol during bolus administration of intravenous contrast. CONTRAST:  3076mMNIPAQUE IOHEXOL 300 MG/ML SOLN, 100m39mNIPAQUE IOHEXOL 300 MG/ML SOLN COMPARISON:  October 12, 2018 FINDINGS: CT CHEST FINDINGS Cardiovascular: Injectable port terminates at the cavoatrial junction. Normal heart size.  Minimal pericardial effusion. Calcific atherosclerotic disease of the coronary arteries. Mild calcific atherosclerotic disease of the aorta. No central pulmonary embolus. Mediastinum/Nodes: No enlarged mediastinal, hilar, or axillary lymph nodes. Thyroid gland, trachea, and esophagus demonstrate no significant findings. Right precarinal lymph node measures 9 mm in short axis, sub pathologic by CT criteria. Lungs/Pleura: Marked upper lobe predominant paraseptal emphysema. Interval development of  near complete opacification of the superior segment of the right lower lobe, which obscures the previously demonstrated subpleural pulmonary nodule. Spiculated peribronchial airspace opacity is now seen in the right middle lobe. Air bronchograms within these abnormalities suggest consolidation. Musculoskeletal: No chest wall mass or suspicious bone lesions identified. CT ABDOMEN PELVIS FINDINGS Hepatobiliary: No focal liver abnormality is seen. No gallstones, gallbladder wall thickening, or biliary dilatation. Pancreas: Unremarkable. No pancreatic ductal dilatation or surrounding inflammatory changes. Spleen: Normal in size without focal abnormality. Adrenals/Urinary Tract: Adrenal glands are unremarkable. Kidneys are normal, without renal calculi, focal lesion, or hydronephrosis. Right renal cyst. Bladder is unremarkable. Stomach/Bowel: Stomach is within normal limits. Appendix appears normal. No evidence of bowel wall thickening, distention, or inflammatory changes. Vascular/Lymphatic: Aortic atherosclerosis. No enlarged abdominal or pelvic lymph nodes. 8 mm nonspecific central mesenteric lymph node, image 87/136, sequence 2. Reproductive: The prostate is nonenlarged. Other: No abdominal wall hernia or abnormality. No abdominopelvic ascites. Musculoskeletal: Healing fracture of the right posterior eighth rib within an area of sclerosis, likely pathologic fracture within a metastatic lesion. No new lytic or sclerotic lesions. IMPRESSION: 1. Interval development of airspace consolidation with near complete opacification of the superior segment of the right lower lobe, which obscures the previously demonstrated subpleural pulmonary malignancy. 2. Interval development of additional peribronchial airspace consolidation in the right middle lobe. 3. No evidence of metastatic disease to the abdomen or pelvis. 4. Healing pathologic fracture of the right posterior eighth rib. Aortic Atherosclerosis (ICD10-I70.0) and  Emphysema (ICD10-J43.9). Electronically Signed   By: Fidela Salisbury M.D.   On: 12/11/2018 18:35      Subjective: No major events overnight or this morning.  Wife raised concern about his left groin wound given history of diabetes.  We have discussed about appropriate care at home and warning signs.  She is comfortable with this.  Patient denies chest pain, dyspnea, GI or GU symptoms.  No focal neuro symptoms.  He is alert and oriented x4.   Discharge Exam: Vitals:   12/12/18 1950 12/13/18 0407  BP: 100/75 95/74  Pulse: 84 92  Resp: 20 19  Temp: (!) 97.5 F (36.4 C) 98 F (36.7 C)  SpO2: 98% 98%    GENERAL: No acute distress.  Appears well.  HEENT: MMM.  Vision and hearing grossly intact.  NECK: Supple.  No apparent JVD. LUNGS:  No IWOB. Good air movement bilaterally. HEART:  RRR. Heart sounds normal.  ABD/GI/GU: Bowel sounds present. Soft. Non tender.  MSK/EXT:  Moves all extremities. No apparent deformity. No edema bilaterally. SKIN: Small skin wound in left groin with no surrounding skin erythema, underlying fluctuance or tenderness.  No drainage when squeezed. NEURO: Awake, alert and oriented appropriately.  No gross deficit.  PSYCH: Calm. Normal affect.   The results of significant diagnostics from this hospitalization (including imaging, microbiology, ancillary and laboratory) are listed below for reference.     Microbiology: Recent Results (from the past 240 hour(s))  Culture, blood (Routine X 2) w Reflex to ID Panel     Status: None (Preliminary result)   Collection Time: 12/09/18 10:09 AM   Specimen: BLOOD  Result Value Ref Range Status   Specimen Description   Final    BLOOD LEFT ANTECUBITAL Performed at [facility] [address] [place], [place] [address]    Special Requests   Final    BOTTLES DRAWN AEROBIC AND ANAEROBIC Blood Culture adequate volume Performed at [facility] [address], [place], [place]  [address]    Culture   Final    NO GROWTH 3 DAYS Performed at [facility], [place] [address] [place], [place] [address]    Report Status PENDING  Incomplete  Culture, blood (Routine X 2) w Reflex to ID Panel     Status: None (Preliminary result)   Collection Time: 12/09/18  2:31 PM   Specimen: BLOOD  Result Value Ref Range Status   Specimen Description   Final    BLOOD RIGHT ANTECUBITAL Performed at [facility] [address] [place], [place] [address]    Special Requests   Final    BOTTLES DRAWN AEROBIC AND ANAEROBIC Blood Culture adequate volume Performed at [facility] [address] [place], [place] [address]    Culture   Final    NO GROWTH 3 DAYS Performed at [facility], [place] [address] [place], [place] [address]    Report Status PENDING  Incomplete  SARS Coronavirus 2 [facility] order, Performed in [facility] hospital lab) Nasopharyngeal Nasopharyngeal Swab     Status: None   Collection Time: 12/09/18  5:06 PM   Specimen: Nasopharyngeal Swab  Result Value Ref Range Status   SARS Coronavirus 2 NEGATIVE NEGATIVE Final    Comment: (NOTE) If result is NEGATIVE SARS-CoV-2 target nucleic acids are NOT DETECTED. The SARS-CoV-2 RNA is generally detectable in upper and lower  respiratory specimens during the acute phase of infection. The lowest  concentration of SARS-CoV-2 viral copies this assay can detect is 250  copies / mL. A negative result does not preclude SARS-CoV-2 infection  and should not be used as the sole basis for treatment or other  patient management decisions.  A negative result may occur with  improper specimen collection / handling, submission of specimen other  than nasopharyngeal swab, presence of viral mutation(s) within the  areas targeted by this assay, and inadequate number of viral copies  (<250 copies / mL). A negative result must be combined with clinical  observations, patient history, and  epidemiological information. If result is POSITIVE SARS-CoV-2 target nucleic acids are DETECTED. The SARS-CoV-2 RNA is generally detectable in upper and lower  respiratory specimens dur ing the acute phase of infection.  Positive  results are indicative of active infection with SARS-CoV-2.  Clinical  correlation with patient history and other diagnostic information is  necessary to determine patient infection status.  Positive results do  not rule out bacterial infection or co-infection with other viruses. If result is PRESUMPTIVE POSTIVE SARS-CoV-2 nucleic acids MAY BE PRESENT.   A presumptive positive result was obtained on the submitted specimen  and confirmed on repeat testing.  While 2019 novel coronavirus  (SARS-CoV-2) nucleic acids may be present in the submitted sample  additional confirmatory testing may be necessary for epidemiological  and / or clinical management purposes  to differentiate between  SARS-CoV-2 and other Sarbecovirus currently known to infect humans.  If clinically indicated additional testing with an alternate test  methodology [phone]) is advised. The SARS-CoV-2 RNA is generally  detectable in upper and lower respiratory sp ecimens during the acute  phase of infection. The expected result is Negative. Fact Sheet for Patients:  StrictlyIdeas.no Fact Sheet for Healthcare Providers: BankingDealers.co.za This test is not yet approved or cleared by the [place] FDA and has been authorized for detection and/or diagnosis of SARS-CoV-2 by FDA under an Emergency Use Authorization (EUA).  This EUA will remain in effect (meaning this test can be used) for the duration of the COVID-19 declaration under Section 564(b)(1) of the Act, 21 U.S.C. section 360bbb-3(b)(1), unless the authorization is terminated or revoked sooner. Performed at [facility], [place] [address], [place], [place] [address]     Urine Culture     Status: None   Collection Time: 12/09/18  5:18 PM   Specimen: Urine, Random  Result Value Ref Range Status   Specimen Description   Final    URINE, RANDOM Performed at [facility] [address] [place], [place] [address]    Special Requests   Final    NONE Performed at [facility], [place] [address] [place], [place] [address]    Culture   Final    NO GROWTH Performed at [facility], [place] [address], [place], [place] [address]    Report Status 12/10/2018 FINAL  Final  MRSA PCR Screening     Status: None   Collection Time: 12/10/18  5:52 AM   Specimen: Nasal Mucosa; Nasopharyngeal  Result Value Ref Range Status   MRSA by PCR NEGATIVE NEGATIVE Final    Comment:        The GeneXpert MRSA Assay (FDA approved for NASAL specimens only), is one component of a comprehensive MRSA colonization surveillance program. It is not intended to diagnose MRSA infection nor to guide or monitor treatment for MRSA infections. Performed at [facility], [place] [address] [place], [place] [address]      Labs: BNP (last 3 results) No results for input(s): BNP in the last 8760 hours. Basic Metabolic Panel: Recent Labs  Lab 12/09/18 1009 12/09/18 2055 12/10/18 0552 12/11/18 0239 12/12/18 0530 12/13/18 0605  NA 136  --  138 139 139 138  K 3.8  --  3.5 3.5 3.2* 3.6  CL 100  --  103 106 106 109  CO2 24  --  '22 23 24 22  ' GLUCOSE 147*  --  115* 119* 132* 127*  BUN 10  --  '11 11 13 11  ' CREATININE 1.08 1.00 1.00 0.80 0.79 0.79  CALCIUM 8.2*  --  8.0* 8.5* 8.2* 8.2*  MG  --   --  1.3* 1.6* 2.0  --   PHOS  --   --   --  2.7 2.9  --    Liver Function Tests: Recent Labs  Lab 12/08/18 1109 12/09/18 1009 12/11/18 0239 12/12/18 0530  AST 22 32 46* 39  ALT '15 20 30 28  ' ALKPHOS 110 106 89 83  BILITOT 0.8 1.1 1.2 0.9  PROT 6.3* 6.5 6.4* 5.8*  ALBUMIN 2.7* 2.7* 2.7* 2.5*   Recent Labs  Lab 12/09/18 1009   LIPASE 21   Recent Labs  Lab 12/10/18 1244  AMMONIA 26   CBC: Recent Labs  Lab 12/08/18 1109 12/09/18 1009 12/10/18 0552 12/11/18 0239 12/13/18 0605  WBC 4.9 9.6 8.4 5.5 2.7*  NEUTROABS 3.5 8.8* 7.0  --   --   HGB 9.7* 10.1* 10.1* 11.1* 9.3*  HCT 29.0* 31.6* 31.1* 34.1* 28.3*  MCV 95.4 98.8 97.2 98.0 97.9  PLT 219 238 165  224 161   Cardiac Enzymes: No results for input(s): CKTOTAL, CKMB, CKMBINDEX, TROPONINI in the last 168 hours. BNP: Invalid input(s): POCBNP CBG: Recent Labs  Lab 12/12/18 1608 12/12/18 1952 12/13/18 0006 12/13/18 0410 12/13/18 0735  GLUCAP 128* 140* 110* 118* 113*   D-Dimer No results for input(s): DDIMER in the last 72 hours. Hgb A1c No results for input(s): HGBA1C in the last 72 hours. Lipid Profile No results for input(s): CHOL, HDL, LDLCALC, TRIG, CHOLHDL, LDLDIRECT in the last 72 hours. Thyroid function studies No results for input(s): TSH, T4TOTAL, T3FREE, THYROIDAB in the last 72 hours.  Invalid input(s): FREET3 Anemia work up Recent Labs    12/10/18 1244  VITAMINB12 2,861*   Urinalysis    Component Value Date/Time   COLORURINE YELLOW 12/09/2018 [place] 12/09/2018 1718   LABSPEC 1.020 12/09/2018 1718   PHURINE 6.0 12/09/2018 1718   GLUCOSEU 150 (A) 12/09/2018 1718   HGBUR NEGATIVE 12/09/2018 1718   BILIRUBINUR MODERATE (A) 12/09/2018 1718   KETONESUR NEGATIVE 12/09/2018 1718   PROTEINUR 30 (A) 12/09/2018 1718   UROBILINOGEN 1.0 12/16/2008 0451   NITRITE NEGATIVE 12/09/2018 1718   LEUKOCYTESUR NEGATIVE 12/09/2018 1718   Sepsis Labs Invalid input(s): PROCALCITONIN,  WBC,  LACTICIDVEN   Time coordinating discharge: 35 minutes  SIGNED:  Mercy Riding, MD  [facility] 12/13/2018, 7:56 AM  If 7PM-7AM, please contact night-coverage [email or web address] Password [email or web address]

## 2018-12-13 NOTE — Consult Note (Signed)
[facility] Nurse wound consult note Left groin abscess site, present on admission.  Wife states there is increased odor.  Will begin silver hydrofiber dressing for topical antimicrobial coverage.  Has been on Vancocin. WBC 2.7 this AM.  Assessment from 12/12/18 at 2000 states there is no drainage.  Reason for Consult:Nonhealing left groin wound, present on admission.  Wound type:infectious.  Pressure Injury POA: NA Measurement: 1 cm nonintact lesion Wound [id number] to visualize ( I & D site) Drainage (amount, consistency, odor) None noted Periwound:intact Dressing procedure/placement/frequency:Cleanse left inguinal wound with NS and pat dry.  Apply NS moist strip of Aquacel Ag over wound opening. Pack, if possible, although not likely. Cover with dry dressing and tape.  Change Mon/Wed/Fri. Will not follow at this time.  Please re-consult if needed.  Domenic Moras MSN, RN, FNP-BC CWON Wound, Ostomy, Continence Nurse Pager [phone]

## 2018-12-14 LAB — CULTURE, BLOOD (ROUTINE X 2)
Culture: NO GROWTH
Culture: NO GROWTH
Special Requests: ADEQUATE
Special Requests: ADEQUATE

## 2018-12-15 ENCOUNTER — Telehealth: Payer: Self-pay | Admitting: Internal Medicine

## 2018-12-15 ENCOUNTER — Telehealth: Payer: Self-pay | Admitting: *Deleted

## 2018-12-15 ENCOUNTER — Other Ambulatory Visit: Payer: Self-pay | Admitting: *Deleted

## 2018-12-15 ENCOUNTER — Other Ambulatory Visit: Payer: Self-pay

## 2018-12-15 ENCOUNTER — Inpatient Hospital Stay: Payer: Medicare Other

## 2018-12-15 DIAGNOSIS — C3431 Malignant neoplasm of lower lobe, right bronchus or lung: Secondary | ICD-10-CM | POA: Diagnosis present

## 2018-12-15 DIAGNOSIS — C7951 Secondary malignant neoplasm of bone: Secondary | ICD-10-CM | POA: Diagnosis not present

## 2018-12-15 DIAGNOSIS — Z5189 Encounter for other specified aftercare: Secondary | ICD-10-CM | POA: Insufficient documentation

## 2018-12-15 DIAGNOSIS — Z5112 Encounter for antineoplastic immunotherapy: Secondary | ICD-10-CM | POA: Insufficient documentation

## 2018-12-15 DIAGNOSIS — C3491 Malignant neoplasm of unspecified part of right bronchus or lung: Secondary | ICD-10-CM

## 2018-12-15 DIAGNOSIS — Z5111 Encounter for antineoplastic chemotherapy: Secondary | ICD-10-CM | POA: Insufficient documentation

## 2018-12-15 DIAGNOSIS — Z79899 Other long term (current) drug therapy: Secondary | ICD-10-CM | POA: Insufficient documentation

## 2018-12-15 DIAGNOSIS — D702 Other drug-induced agranulocytosis: Secondary | ICD-10-CM

## 2018-12-15 LAB — CMP (CANCER CENTER ONLY)
ALT: 187 U/L — ABNORMAL HIGH (ref 0–44)
AST: 178 U/L (ref 15–41)
Albumin: 2.9 g/dL — ABNORMAL LOW (ref 3.5–5.0)
Alkaline Phosphatase: 160 U/L — ABNORMAL HIGH (ref 38–126)
Anion gap: 13 (ref 5–15)
BUN: 7 mg/dL — ABNORMAL LOW (ref 8–23)
CO2: 21 mmol/L — ABNORMAL LOW (ref 22–32)
Calcium: 8.5 mg/dL — ABNORMAL LOW (ref 8.9–10.3)
Chloride: 107 mmol/L (ref 98–111)
Creatinine: 1.08 mg/dL (ref 0.61–1.24)
GFR, Est AFR Am: >60 mL/min (ref >60)
GFR, Estimated: >60 mL/min (ref >60)
Glucose, Bld: 123 mg/dL — ABNORMAL HIGH (ref 70–99)
Potassium: 3.5 mmol/L (ref 3.5–5.1)
Sodium: 141 mmol/L (ref 135–145)
Total Bilirubin: 0.8 mg/dL (ref 0.3–1.2)
Total Protein: 6.7 g/dL (ref 6.5–8.1)

## 2018-12-15 LAB — CBC WITH DIFFERENTIAL (CANCER CENTER ONLY)
Abs Immature Granulocytes: 0 10*3/uL (ref 0.00–0.07)
Basophils Absolute: 0 10*3/uL (ref 0.0–0.1)
Basophils Relative: 4 %
Eosinophils Absolute: 0.1 10*3/uL (ref 0.0–0.5)
Eosinophils Relative: 7 %
HCT: 28.2 % — ABNORMAL LOW (ref 39.0–52.0)
Hemoglobin: 9.4 g/dL — ABNORMAL LOW (ref 13.0–17.0)
Immature Granulocytes: 0 %
Lymphocytes Relative: 37 %
Lymphs Abs: 0.4 10*3/uL — ABNORMAL LOW (ref 0.7–4.0)
MCH: 31.9 pg (ref 26.0–34.0)
MCHC: 33.3 g/dL (ref 30.0–36.0)
MCV: 95.6 fL (ref 80.0–100.0)
Monocytes Absolute: 0.1 10*3/uL (ref 0.1–1.0)
Monocytes Relative: 6 %
Neutro Abs: 0.4 10*3/uL — CL (ref 1.7–7.7)
Neutrophils Relative %: 46 %
Platelet Count: 112 10*3/uL — ABNORMAL LOW (ref 150–400)
RBC: 2.95 MIL/uL — ABNORMAL LOW (ref 4.22–5.81)
RDW: 14.3 % (ref 11.5–15.5)
WBC Count: 1 10*3/uL — ABNORMAL LOW (ref 4.0–10.5)
nRBC: 0 % (ref 0.0–0.2)

## 2018-12-15 NOTE — Telephone Encounter (Signed)
Received call report from [facility]  "Today's AST = 178."  Spoke to collaborative with results.

## 2018-12-15 NOTE — Telephone Encounter (Signed)
Pt had labs done today. WBC is 1.0 ANC is 0.4.  Dr. Julien Nordmann aware. Pt to receive granix 319mcg x2 on Thursday and Friday of this week. Orders placed, High priority scheduling message sent. Attempted to call pt to let him know. No answer @ home. Called mobile # and was able to leave vm message for pt to call back.

## 2018-12-15 NOTE — Telephone Encounter (Signed)
Received call report from [facility].  "Today's [place] = 0.4."  Provided provider a note with results.

## 2018-12-15 NOTE — Telephone Encounter (Signed)
Received call back from pt's wife and informed her of pt's lab results and and appts for tomorrow and Friday for Granix injections.  Reviewed neutropenia precautions with her.. Advised to take pt to ED if temp is >100.4 She voiced understanding

## 2018-12-15 NOTE — Telephone Encounter (Signed)
Scheduled appt per 8/12 sch message - pt aware of appt date and time

## 2018-12-16 ENCOUNTER — Inpatient Hospital Stay: Payer: Medicare Other

## 2018-12-16 ENCOUNTER — Other Ambulatory Visit: Payer: Self-pay

## 2018-12-16 VITALS — BP 93/72 | HR 83 | Temp 99.0°F | Resp 16

## 2018-12-16 DIAGNOSIS — Z95828 Presence of other vascular implants and grafts: Secondary | ICD-10-CM

## 2018-12-16 DIAGNOSIS — Z5112 Encounter for antineoplastic immunotherapy: Secondary | ICD-10-CM | POA: Diagnosis not present

## 2018-12-16 MED ORDER — FILGRASTIM-SNDZ 480 MCG/0.8ML IJ SOSY
480.0000 ug | PREFILLED_SYRINGE | Freq: Once | INTRAMUSCULAR | Status: AC
  Administered 2018-12-16: 480 ug via SUBCUTANEOUS
  Filled 2018-12-16: qty 0.8

## 2018-12-16 MED ORDER — ALTEPLASE 2 MG IJ SOLR
2.0000 mg | Freq: Once | INTRAMUSCULAR | Status: DC | PRN
  Filled 2018-12-16: qty 2

## 2018-12-16 MED ORDER — TBO-FILGRASTIM 480 MCG/0.8ML ~~LOC~~ SOSY: 480.0000 ug | PREFILLED_SYRINGE | Freq: Once | SUBCUTANEOUS | Status: DC

## 2018-12-16 NOTE — Progress Notes (Signed)
Confirmed patient is to get filgrastim 480 mcg x2 doses. Has auth for zarxio.   Demetrius Charity, PharmD, [facility] Pharmacist Pharmacy Phone: [phone] 12/16/2018

## 2018-12-16 NOTE — Patient Instructions (Signed)
Tbo-Filgrastim injection What is this medicine? TBO-FILGRASTIM (T B O fil GRA stim) is a granulocyte colony-stimulating factor that helps you make more neutrophils, a type of white blood cell. Neutrophils are important for fighting infections. Some chemotherapy affects your bone marrow and lowers your neutrophils. This medicine helps decrease the length of time that neutrophils are very low (severe neutropenia). This medicine may be used for other purposes; ask your health care provider or pharmacist if you have questions. COMMON BRAND NAME(S): Granix What should I tell my health care provider before I take this medicine? They need to know if you have any of these conditions:  bone scan or tests planned  kidney disease  sickle cell anemia  an unusual or allergic reaction to tbo-filgrastim, filgrastim, pegfilgrastim, other medicines, foods, dyes, or preservatives  pregnant or trying to get pregnant  breast-feeding How should I use this medicine? This medicine is for injection under the skin. If you get this medicine at home, you will be taught how to prepare and give this medicine. Refer to the Instructions for Use that come with your medication packaging. Use exactly as directed. Take your medicine at regular intervals. Do not take your medicine more often than directed. It is important that you put your used needles and syringes in a special sharps container. Do not put them in a trash can. If you do not have a sharps container, call your pharmacist or healthcare provider to get one. Talk to your pediatrician regarding the use of this medicine in children. While this drug may be prescribed for children as young as 66 month of age for selected conditions, precautions do apply. Overdosage: If you think you have taken too much of this medicine contact a poison control center or emergency room at once. NOTE: This medicine is only for you. Do not share this medicine with others. What if I miss a  dose? It is important not to miss your dose. Call your doctor or health care professional if you miss a dose. What may interact with this medicine? This medicine may interact with the following medications:  medicines that may cause a release of neutrophils, such as lithium This list may not describe all possible interactions. Give your health care provider a list of all the medicines, herbs, non-prescription drugs, or dietary supplements you use. Also tell them if you smoke, drink alcohol, or use illegal drugs. Some items may interact with your medicine. What should I watch for while using this medicine? You may need blood work done while you are taking this medicine. What side effects may I notice from receiving this medicine? Side effects that you should report to your doctor or health care professional as soon as possible:  allergic reactions like skin rash, itching or hives, swelling of the face, lips, or tongue  back pain  blood in the urine  dark urine  dizziness  fast heartbeat  feeling faint  shortness of breath or breathing problems  signs and symptoms of infection like fever or chills; cough; or sore throat  signs and symptoms of kidney injury like trouble passing urine or change in the amount of urine  stomach or side pain, or pain at the shoulder  sweating  swelling of the legs, ankles, or abdomen  tiredness Side effects that usually do not require medical attention (report to your doctor or health care professional if they continue or are bothersome):  bone pain  diarrhea  headache  muscle pain  vomiting This list may  not describe all possible side effects. Call your doctor for medical advice about side effects. You may report side effects to FDA at 1-800-FDA-1088. Where should I keep my medicine? Keep out of the reach of children. Store in a refrigerator between 2 and 8 degrees C (36 and 46 degrees F). Keep in carton to protect from light. Throw  away this medicine if it is left out of the refrigerator for more than 5 consecutive days. Throw away any unused medicine after the expiration date. NOTE: This sheet is a summary. It may not cover all possible information. If you have questions about this medicine, talk to your doctor, pharmacist, or health care provider.  2020 [employer]vier/Gold Standard (2018-02-20 19:58:39)

## 2018-12-17 ENCOUNTER — Inpatient Hospital Stay: Payer: Medicare Other

## 2018-12-17 ENCOUNTER — Other Ambulatory Visit: Payer: Self-pay

## 2018-12-17 VITALS — BP 98/72 | HR 82 | Temp 98.0°F | Resp 16

## 2018-12-17 DIAGNOSIS — Z5112 Encounter for antineoplastic immunotherapy: Secondary | ICD-10-CM | POA: Diagnosis not present

## 2018-12-17 DIAGNOSIS — Z95828 Presence of other vascular implants and grafts: Secondary | ICD-10-CM

## 2018-12-17 MED ORDER — FILGRASTIM-SNDZ 480 MCG/0.8ML IJ SOSY
480.0000 ug | PREFILLED_SYRINGE | Freq: Once | INTRAMUSCULAR | Status: AC
  Administered 2018-12-17: 480 ug via SUBCUTANEOUS
  Filled 2018-12-17: qty 0.8

## 2018-12-22 ENCOUNTER — Telehealth: Payer: Self-pay | Admitting: Medical Oncology

## 2018-12-22 ENCOUNTER — Other Ambulatory Visit: Payer: Self-pay

## 2018-12-22 ENCOUNTER — Inpatient Hospital Stay: Payer: Medicare Other

## 2018-12-22 DIAGNOSIS — Z5112 Encounter for antineoplastic immunotherapy: Secondary | ICD-10-CM | POA: Diagnosis not present

## 2018-12-22 DIAGNOSIS — E876 Hypokalemia: Secondary | ICD-10-CM

## 2018-12-22 DIAGNOSIS — C3491 Malignant neoplasm of unspecified part of right bronchus or lung: Secondary | ICD-10-CM

## 2018-12-22 LAB — CBC WITH DIFFERENTIAL (CANCER CENTER ONLY)
Abs Immature Granulocytes: 0.42 10*3/uL — ABNORMAL HIGH (ref 0.00–0.07)
Basophils Absolute: 0 10*3/uL (ref 0.0–0.1)
Basophils Relative: 1 %
Eosinophils Absolute: 0.1 10*3/uL (ref 0.0–0.5)
Eosinophils Relative: 2 %
HCT: 27.5 % — ABNORMAL LOW (ref 39.0–52.0)
Hemoglobin: 9.3 g/dL — ABNORMAL LOW (ref 13.0–17.0)
Immature Granulocytes: 10 %
Lymphocytes Relative: 9 %
Lymphs Abs: 0.4 10*3/uL — ABNORMAL LOW (ref 0.7–4.0)
MCH: 32.5 pg (ref 26.0–34.0)
MCHC: 33.8 g/dL (ref 30.0–36.0)
MCV: 96.2 fL (ref 80.0–100.0)
Monocytes Absolute: 1.3 10*3/uL — ABNORMAL HIGH (ref 0.1–1.0)
Monocytes Relative: 31 %
Neutro Abs: 2 10*3/uL (ref 1.7–7.7)
Neutrophils Relative %: 47 %
Platelet Count: 140 10*3/uL — ABNORMAL LOW (ref 150–400)
RBC: 2.86 MIL/uL — ABNORMAL LOW (ref 4.22–5.81)
RDW: 14.5 % (ref 11.5–15.5)
WBC Count: 4.3 10*3/uL (ref 4.0–10.5)
nRBC: 0.7 % — ABNORMAL HIGH (ref 0.0–0.2)

## 2018-12-22 LAB — CMP (CANCER CENTER ONLY)
ALT: 25 U/L (ref 0–44)
AST: 19 U/L (ref 15–41)
Albumin: 2.8 g/dL — ABNORMAL LOW (ref 3.5–5.0)
Alkaline Phosphatase: 111 U/L (ref 38–126)
Anion gap: 11 (ref 5–15)
BUN: 5 mg/dL — ABNORMAL LOW (ref 8–23)
CO2: 22 mmol/L (ref 22–32)
Calcium: 8 mg/dL — ABNORMAL LOW (ref 8.9–10.3)
Chloride: 107 mmol/L (ref 98–111)
Creatinine: 1.27 mg/dL — ABNORMAL HIGH (ref 0.61–1.24)
GFR, Est AFR Am: >60 mL/min (ref >60)
GFR, Estimated: 58 mL/min — ABNORMAL LOW (ref >60)
Glucose, Bld: 100 mg/dL — ABNORMAL HIGH (ref 70–99)
Potassium: 2.7 mmol/L — CL (ref 3.5–5.1)
Sodium: 140 mmol/L (ref 135–145)
Total Bilirubin: 0.8 mg/dL (ref 0.3–1.2)
Total Protein: 6.3 g/dL — ABNORMAL LOW (ref 6.5–8.1)

## 2018-12-22 MED ORDER — POTASSIUM CHLORIDE CRYS ER 20 MEQ PO TBCR: 20.0000 meq | EXTENDED_RELEASE_TABLET | Freq: Two times a day (BID) | ORAL | 0 refills | Status: AC

## 2018-12-22 NOTE — Telephone Encounter (Signed)
Arlene notified to pick up kdur for Zephan to take.

## 2018-12-29 ENCOUNTER — Inpatient Hospital Stay: Payer: Medicare Other

## 2018-12-29 ENCOUNTER — Telehealth: Payer: Self-pay | Admitting: Internal Medicine

## 2018-12-29 ENCOUNTER — Other Ambulatory Visit: Payer: Self-pay

## 2018-12-29 ENCOUNTER — Inpatient Hospital Stay (HOSPITAL_BASED_OUTPATIENT_CLINIC_OR_DEPARTMENT_OTHER): Payer: Medicare Other | Admitting: Internal Medicine

## 2018-12-29 ENCOUNTER — Encounter: Payer: Self-pay | Admitting: Internal Medicine

## 2018-12-29 VITALS — BP 109/86 | HR 77 | Temp 98.7°F | Resp 17 | Ht 69.0 in | Wt 162.7 lb

## 2018-12-29 DIAGNOSIS — C3491 Malignant neoplasm of unspecified part of right bronchus or lung: Secondary | ICD-10-CM

## 2018-12-29 DIAGNOSIS — Z5112 Encounter for antineoplastic immunotherapy: Secondary | ICD-10-CM | POA: Diagnosis not present

## 2018-12-29 DIAGNOSIS — Z95828 Presence of other vascular implants and grafts: Secondary | ICD-10-CM

## 2018-12-29 LAB — CBC WITH DIFFERENTIAL (CANCER CENTER ONLY)
Abs Immature Granulocytes: 0.13 10*3/uL — ABNORMAL HIGH (ref 0.00–0.07)
Basophils Absolute: 0.1 10*3/uL (ref 0.0–0.1)
Basophils Relative: 1 %
Eosinophils Absolute: 0.1 10*3/uL (ref 0.0–0.5)
Eosinophils Relative: 2 %
HCT: 28.2 % — ABNORMAL LOW (ref 39.0–52.0)
Hemoglobin: 9.2 g/dL — ABNORMAL LOW (ref 13.0–17.0)
Immature Granulocytes: 2 %
Lymphocytes Relative: 8 %
Lymphs Abs: 0.5 10*3/uL — ABNORMAL LOW (ref 0.7–4.0)
MCH: 31.9 pg (ref 26.0–34.0)
MCHC: 32.6 g/dL (ref 30.0–36.0)
MCV: 97.9 fL (ref 80.0–100.0)
Monocytes Absolute: 0.8 10*3/uL (ref 0.1–1.0)
Monocytes Relative: 12 %
Neutro Abs: 5.2 10*3/uL (ref 1.7–7.7)
Neutrophils Relative %: 75 %
Platelet Count: 288 10*3/uL (ref 150–400)
RBC: 2.88 MIL/uL — ABNORMAL LOW (ref 4.22–5.81)
RDW: 15.3 % (ref 11.5–15.5)
WBC Count: 6.8 10*3/uL (ref 4.0–10.5)
nRBC: 0.4 % — ABNORMAL HIGH (ref 0.0–0.2)

## 2018-12-29 LAB — CMP (CANCER CENTER ONLY)
ALT: 12 U/L (ref 0–44)
AST: 20 U/L (ref 15–41)
Albumin: 2.6 g/dL — ABNORMAL LOW (ref 3.5–5.0)
Alkaline Phosphatase: 83 U/L (ref 38–126)
Anion gap: 12 (ref 5–15)
BUN: 15 mg/dL (ref 8–23)
CO2: 21 mmol/L — ABNORMAL LOW (ref 22–32)
Calcium: 8 mg/dL — ABNORMAL LOW (ref 8.9–10.3)
Chloride: 107 mmol/L (ref 98–111)
Creatinine: 1.84 mg/dL — ABNORMAL HIGH (ref 0.61–1.24)
GFR, Est AFR Am: 43 mL/min — ABNORMAL LOW (ref >60)
GFR, Estimated: 37 mL/min — ABNORMAL LOW (ref >60)
Glucose, Bld: 124 mg/dL — ABNORMAL HIGH (ref 70–99)
Potassium: 3.7 mmol/L (ref 3.5–5.1)
Sodium: 140 mmol/L (ref 135–145)
Total Bilirubin: 0.6 mg/dL (ref 0.3–1.2)
Total Protein: 6.2 g/dL — ABNORMAL LOW (ref 6.5–8.1)

## 2018-12-29 LAB — TSH: TSH: 3.352 u[IU]/mL (ref 0.320–4.118)

## 2018-12-29 MED ORDER — HEPARIN SOD (PORK) LOCK FLUSH 100 UNIT/ML IV SOLN
500.0000 [IU] | Freq: Once | INTRAVENOUS | Status: AC | PRN
  Administered 2018-12-29: 16:00:00 500 [IU]
  Filled 2018-12-29: qty 5

## 2018-12-29 MED ORDER — SODIUM CHLORIDE 0.9 % IV SOLN
200.0000 mg | Freq: Once | INTRAVENOUS | Status: AC
  Administered 2018-12-29: 200 mg via INTRAVENOUS
  Filled 2018-12-29: qty 8

## 2018-12-29 MED ORDER — SODIUM CHLORIDE 0.9% FLUSH
10.0000 mL | INTRAVENOUS | Status: DC | PRN
  Administered 2018-12-29: 10 mL
  Filled 2018-12-29: qty 10

## 2018-12-29 MED ORDER — SODIUM CHLORIDE 0.9 % IV SOLN
Freq: Once | INTRAVENOUS | Status: AC
  Administered 2018-12-29: 14:00:00 via INTRAVENOUS
  Filled 2018-12-29: qty 5

## 2018-12-29 MED ORDER — PALONOSETRON HCL INJECTION 0.25 MG/5ML: 0.2500 mg | Freq: Once | INTRAVENOUS | Status: DC

## 2018-12-29 MED ORDER — SODIUM CHLORIDE 0.9 % IV SOLN
Freq: Once | INTRAVENOUS | Status: DC
  Filled 2018-12-29: qty 250

## 2018-12-29 MED ORDER — SODIUM CHLORIDE 0.9 % IV SOLN
INTRAVENOUS | Status: AC
  Administered 2018-12-29: 13:00:00 via INTRAVENOUS
  Filled 2018-12-29 (×2): qty 250

## 2018-12-29 MED ORDER — SODIUM CHLORIDE 0.9 % IV SOLN
271.6000 mg | Freq: Once | INTRAVENOUS | Status: AC
  Administered 2018-12-29: 270 mg via INTRAVENOUS
  Filled 2018-12-29: qty 27

## 2018-12-29 NOTE — Progress Notes (Signed)
CBC and Cmet reviewed by MD-No Alimta due to elevated creatinine

## 2018-12-29 NOTE — Patient Instructions (Signed)

## 2018-12-29 NOTE — Progress Notes (Signed)
°    [facility] °[phone]   [phone] ° °OFFICE PROGRESS NOTE ° °Knox, George, MD °[address] °[place] [address] ° °DIAGNOSIS: Stage IV (T2b, N2, M1c) non-small cell lung cancer, adenocarcinoma diagnosed in April 2020 and presented with right lower lobe lung mass as well as right hilar and mediastinal lymphadenopathy as well as bone metastasis in the right eighth rib. ° °Guardant 360 Molecular study: °NTRK1 [id number] 0.3% Synonymous Alteration§ ° °PRIOR THERAPY: Palliative radiotherapy to the right eighth rib in addition to concurrent chemoradiation with weekly carboplatin for AUC of 2 and paclitaxel 45 mg/M2 to the lung mass and mediastinal lymphadenopathy for the locally advanced disease.  Status post 4 cycle of chemotherapy.  Last cycle was given on 09/13/2018. ° °CURRENT THERAPY: Systemic chemotherapy with carboplatin for AUC of 5, Alimta 500 mg/M2 and Keytruda 200 mg IV every 3 weeks.  First dose October 27, 2018.  Status post 3 cycles. ° ° °INTERVAL HISTORY: °Zachary Knox 67 y.o. male returns to the clinic today for follow-up visit.  The patient is feeling much better today.  He was admitted to [facility] few days after his chemotherapy with fever and suspicious pneumonia.  He has CT of the chest performed at that time that showed airspace disease and consolidation in the right lung.  He was treated with a course of antibiotics.  He is feeling much better.  He denied having any current chest pain but has shortness of breath with exertion with mild cough and no hemoptysis.  He denied having any current fever or chills.  He has no nausea, vomiting, diarrhea or constipation.  He is here today for evaluation before starting cycle #4. ° ° ° °MEDICAL HISTORY: °Past Medical History:  °Diagnosis Date  °• Cancer (HCC) 2020  ° lung  °• Diabetes mellitus without complication (HCC)   °• Dyspnea   ° increased exertion  °• Hypertension   ° ° °ALLERGIES:  has  No Known Allergies. ° °MEDICATIONS:  °Current Outpatient Medications  °Medication Sig Dispense Refill  °• acetaminophen (TYLENOL) 500 MG tablet Take 1,000 mg by mouth every 6 (six) hours as needed for mild pain.     °• albuterol (VENTOLIN) 2 MG/5ML syrup TK 5 ML PO Q 4 H COU OR CHEST TIGHTNESS    °• amLODipine (NORVASC) 5 MG tablet Take 5 mg by mouth daily.    °• amoxicillin-clavulanate (AUGMENTIN) 875-125 MG tablet Take 1 tablet by mouth every 12 (twelve) hours. 10 tablet 0  °• aspirin EC 81 MG tablet Take 81 mg by mouth daily.     °• atorvastatin (LIPITOR) 20 MG tablet Take 20 mg by mouth daily.    °• azithromycin (ZITHROMAX) 250 MG tablet Take two tablets (500 mg) tongiht 2 tablet 0  °• chlorpheniramine-HYDROcodone (TUSSIONEX) 10-8 MG/5ML SUER     °• folic acid (FOLVITE) 1 MG tablet Take 1 tablet (1 mg total) by mouth daily. 30 tablet 4  °• gabapentin (NEURONTIN) 400 MG capsule Take 400 mg by mouth at bedtime.     °• levothyroxine (SYNTHROID, LEVOTHROID) 100 MCG tablet Take 100 mcg by mouth daily.    °• lidocaine (XYLOCAINE) 2 % solution Use as directed 15 mLs in the mouth or throat every 6 (six) hours as needed (mouth/throat pain). Swish and swallow (Patient not taking: Reported on 12/09/2018) 100 mL 2  °• metFORMIN (GLUCOPHAGE) 1000 MG tablet Take 500-1,000 mg by mouth See admin instructions. Take 1000 mg every   morning and 500 mg at lunch and supper    °• metoprolol succinate (TOPROL-XL) 100 MG 24 hr tablet Take 100 mg by mouth daily. Take with or immediately following a meal.    °• mirtazapine (REMERON) 7.5 MG tablet TK 1 T PO HS    °• pantoprazole (PROTONIX) 40 MG tablet Take 40 mg by mouth 2 (two) times daily.    °• potassium chloride SA (K-DUR) 20 MEQ tablet Take 1 tablet (20 mEq total) by mouth 2 (two) times daily. 20 tablet 0  °• prochlorperazine (COMPAZINE) 10 MG tablet Take 1 tablet (10 mg total) by mouth every 6 (six) hours as needed for nausea or vomiting. (Patient not taking: Reported on 08/30/2018) 30  tablet 1  °• ramipril (ALTACE) 10 MG capsule Take 10 mg by mouth daily.    °• sucralfate (CARAFATE) 1 GM/10ML suspension Take 10 mLs (1 g total) by mouth 3 (three) times daily as needed. (Patient taking differently: Take 1 g by mouth 3 (three) times daily. ) 420 mL 0  °• TOUJEO SOLOSTAR 300 UNIT/ML SOPN Inject 18 Units into the skin every morning.    ° °No current facility-administered medications for this visit.   ° ° °SURGICAL HISTORY:  °Past Surgical History:  °Procedure Laterality Date  °• CARDIAC SURGERY    ° Heart attack in 2008 with stent placement  °• IR IMAGING GUIDED PORT INSERTION  11/01/2018  °• stent in heart    ° ° °REVIEW OF SYSTEMS:  Constitutional: positive for fatigue °Eyes: negative °Ears, nose, mouth, throat, and face: negative °Respiratory: positive for dyspnea on exertion °Cardiovascular: negative °Gastrointestinal: negative °Genitourinary:negative °Integument/breast: negative °Hematologic/lymphatic: negative °Musculoskeletal:negative °Neurological: negative °Behavioral/Psych: negative °Endocrine: negative °Allergic/Immunologic: negative  ° °PHYSICAL EXAMINATION: General appearance: alert, cooperative, fatigued and no distress °Head: Normocephalic, without obvious abnormality, atraumatic °Neck: no adenopathy, no JVD, supple, symmetrical, trachea midline and thyroid not enlarged, symmetric, no tenderness/mass/nodules °Lymph nodes: Cervical, supraclavicular, and axillary nodes normal. °Resp: clear to auscultation bilaterally °Back: symmetric, no curvature. ROM normal. No CVA tenderness. °Cardio: regular rate and rhythm, S1, S2 normal, no murmur, click, rub or gallop °GI: soft, non-tender; bowel sounds normal; no masses,  no organomegaly °Extremities: extremities normal, atraumatic, no cyanosis or edema °Neurologic: Alert and oriented X 3, normal strength and tone. Normal symmetric reflexes. Normal coordination and gait ° °ECOG PERFORMANCE STATUS: 1 - Symptomatic but completely ambulatory ° °Blood  pressure 109/86, pulse 77, temperature 98.7 °F (37.1 °C), temperature source Temporal, resp. rate 17, height 5' 9" (1.753 m), weight 162 lb 11.2 oz (73.8 kg), SpO2 99 %. ° °LABORATORY DATA: °Lab Results  °Component Value Date  ° WBC 6.8 12/29/2018  ° HGB 9.2 (L) 12/29/2018  ° HCT 28.2 (L) 12/29/2018  ° MCV 97.9 12/29/2018  ° PLT 288 12/29/2018  ° ° °  Chemistry   °   °Component Value Date/Time  ° NA 140 12/22/2018 1440  ° K 2.7 (LL) 12/22/2018 1440  ° CL 107 12/22/2018 1440  ° CO2 22 12/22/2018 1440  ° BUN 5 (L) 12/22/2018 1440  ° CREATININE 1.27 (H) 12/22/2018 1440  °    °Component Value Date/Time  ° CALCIUM 8.0 (L) 12/22/2018 1440  ° ALKPHOS 111 12/22/2018 1440  ° AST 19 12/22/2018 1440  ° ALT 25 12/22/2018 1440  ° BILITOT 0.8 12/22/2018 1440  °  ° ° ° °RADIOGRAPHIC STUDIES: °Dg Chest 2 View ° °Result Date: 12/09/2018 °CLINICAL DATA:  History of lung cancer. Increased weakness and shortness of breath. EXAM:   CHEST - 2 VIEW COMPARISON:  CT scan October 12, 2018.  Chest x-ray November 27, 2018. FINDINGS: No pneumothorax. The right Port-A-Cath is stable, terminating just in the right side of the atrium. The left lung remains clear. Opacity projected over the right hilum on the frontal view and located posteriorly on the lateral view is more prominent in the interval. There is also mild increased opacity more peripherally in the lateral right lung base. No other interval changes. IMPRESSION: 1. Opacity in the region of the patient's known malignancy is more prominent in the interval. While the finding may be at least partially due to difference in technique, disease worsening or a superimposed infiltrate are both possible. There is also increased opacity peripheral to the site of the patient's known malignancy in the lateral right lung base. The increased conspicuity could be due to lower lung volumes versus worsening infiltrate. Electronically Signed   By: David  Williams III M.D   On: 12/09/2018 09:49  ° °Ct Head Wo  Contrast ° °Result Date: 12/09/2018 °CLINICAL DATA:  Altered level of consciousness.  Fever. EXAM: CT HEAD WITHOUT CONTRAST TECHNIQUE: Contiguous axial images were obtained from the base of the skull through the vertex without intravenous contrast. COMPARISON:  None. FINDINGS: Brain: The ventricles are normal in size and configuration for age. There is no intracranial mass, hemorrhage, extra-axial fluid collection, or midline shift. The brain parenchyma appears unremarkable. No acute infarct evident. Vascular: No hyperdense vessel. There are foci of vascular calcification in each carotid siphon. Skull: Bony calvarium appears intact. Sinuses/Orbits: Visualized paranasal sinuses are clear. Orbits appear symmetric bilaterally. Other: Mastoid air cells are clear. IMPRESSION: There are foci of arterial vascular calcification. Brain parenchyma appears unremarkable. No acute infarct. No evident mass or hemorrhage. Electronically Signed   By: William  Woodruff III M.D.   On: 12/09/2018 09:50  ° °Ct Chest W Contrast ° °Result Date: 12/11/2018 °CLINICAL DATA:  Restaging lung cancer. Initial diagnosis March 2020. EXAM: CT CHEST, ABDOMEN, AND PELVIS WITH CONTRAST TECHNIQUE: Multidetector CT imaging of the chest, abdomen and pelvis was performed following the standard protocol during bolus administration of intravenous contrast. CONTRAST:  30mL OMNIPAQUE IOHEXOL 300 MG/ML SOLN, 100mL OMNIPAQUE IOHEXOL 300 MG/ML SOLN COMPARISON:  October 12, 2018 FINDINGS: CT CHEST FINDINGS Cardiovascular: Injectable port terminates at the cavoatrial junction. Normal heart size. Minimal pericardial effusion. Calcific atherosclerotic disease of the coronary arteries. Mild calcific atherosclerotic disease of the aorta. No central pulmonary embolus. Mediastinum/Nodes: No enlarged mediastinal, hilar, or axillary lymph nodes. Thyroid gland, trachea, and esophagus demonstrate no significant findings. Right precarinal lymph node measures 9 mm in short axis, sub  pathologic by CT criteria. Lungs/Pleura: Marked upper lobe predominant paraseptal emphysema. Interval development of near complete opacification of the superior segment of the right lower lobe, which obscures the previously demonstrated subpleural pulmonary nodule. Spiculated peribronchial airspace opacity is now seen in the right middle lobe. Air bronchograms within these abnormalities suggest consolidation. Musculoskeletal: No chest wall mass or suspicious bone lesions identified. CT ABDOMEN PELVIS FINDINGS Hepatobiliary: No focal liver abnormality is seen. No gallstones, gallbladder wall thickening, or biliary dilatation. Pancreas: Unremarkable. No pancreatic ductal dilatation or surrounding inflammatory changes. Spleen: Normal in size without focal abnormality. Adrenals/Urinary Tract: Adrenal glands are unremarkable. Kidneys are normal, without renal calculi, focal lesion, or hydronephrosis. Right renal cyst. Bladder is unremarkable. Stomach/Bowel: Stomach is within normal limits. Appendix appears normal. No evidence of bowel wall thickening, distention, or inflammatory changes. Vascular/Lymphatic: Aortic atherosclerosis. No enlarged abdominal or pelvic lymph   nodes. 8 mm nonspecific central mesenteric lymph node, image 87/136, sequence 2. Reproductive: The prostate is nonenlarged. Other: No abdominal wall hernia or abnormality. No abdominopelvic ascites. Musculoskeletal: Healing fracture of the right posterior eighth rib within an area of sclerosis, likely pathologic fracture within a metastatic lesion. No new lytic or sclerotic lesions. IMPRESSION: 1. Interval development of airspace consolidation with near complete opacification of the superior segment of the right lower lobe, which obscures the previously demonstrated subpleural pulmonary malignancy. 2. Interval development of additional peribronchial airspace consolidation in the right middle lobe. 3. No evidence of metastatic disease to the abdomen or  pelvis. 4. Healing pathologic fracture of the right posterior eighth rib. Aortic Atherosclerosis (ICD10-I70.0) and Emphysema (ICD10-J43.9). Electronically Signed   By: Dobrinka  Dimitrova M.D.   On: 12/11/2018 18:35  ° °Ct Abdomen Pelvis W Contrast ° °Result Date: 12/11/2018 °CLINICAL DATA:  Restaging lung cancer. Initial diagnosis March 2020. EXAM: CT CHEST, ABDOMEN, AND PELVIS WITH CONTRAST TECHNIQUE: Multidetector CT imaging of the chest, abdomen and pelvis was performed following the standard protocol during bolus administration of intravenous contrast. CONTRAST:  30mL OMNIPAQUE IOHEXOL 300 MG/ML SOLN, 100mL OMNIPAQUE IOHEXOL 300 MG/ML SOLN COMPARISON:  October 12, 2018 FINDINGS: CT CHEST FINDINGS Cardiovascular: Injectable port terminates at the cavoatrial junction. Normal heart size. Minimal pericardial effusion. Calcific atherosclerotic disease of the coronary arteries. Mild calcific atherosclerotic disease of the aorta. No central pulmonary embolus. Mediastinum/Nodes: No enlarged mediastinal, hilar, or axillary lymph nodes. Thyroid gland, trachea, and esophagus demonstrate no significant findings. Right precarinal lymph node measures 9 mm in short axis, sub pathologic by CT criteria. Lungs/Pleura: Marked upper lobe predominant paraseptal emphysema. Interval development of near complete opacification of the superior segment of the right lower lobe, which obscures the previously demonstrated subpleural pulmonary nodule. Spiculated peribronchial airspace opacity is now seen in the right middle lobe. Air bronchograms within these abnormalities suggest consolidation. Musculoskeletal: No chest wall mass or suspicious bone lesions identified. CT ABDOMEN PELVIS FINDINGS Hepatobiliary: No focal liver abnormality is seen. No gallstones, gallbladder wall thickening, or biliary dilatation. Pancreas: Unremarkable. No pancreatic ductal dilatation or surrounding inflammatory changes. Spleen: Normal in size without focal  abnormality. Adrenals/Urinary Tract: Adrenal glands are unremarkable. Kidneys are normal, without renal calculi, focal lesion, or hydronephrosis. Right renal cyst. Bladder is unremarkable. Stomach/Bowel: Stomach is within normal limits. Appendix appears normal. No evidence of bowel wall thickening, distention, or inflammatory changes. Vascular/Lymphatic: Aortic atherosclerosis. No enlarged abdominal or pelvic lymph nodes. 8 mm nonspecific central mesenteric lymph node, image 87/136, sequence 2. Reproductive: The prostate is nonenlarged. Other: No abdominal wall hernia or abnormality. No abdominopelvic ascites. Musculoskeletal: Healing fracture of the right posterior eighth rib within an area of sclerosis, likely pathologic fracture within a metastatic lesion. No new lytic or sclerotic lesions. IMPRESSION: 1. Interval development of airspace consolidation with near complete opacification of the superior segment of the right lower lobe, which obscures the previously demonstrated subpleural pulmonary malignancy. 2. Interval development of additional peribronchial airspace consolidation in the right middle lobe. 3. No evidence of metastatic disease to the abdomen or pelvis. 4. Healing pathologic fracture of the right posterior eighth rib. Aortic Atherosclerosis (ICD10-I70.0) and Emphysema (ICD10-J43.9). Electronically Signed   By: Dobrinka  Dimitrova M.D.   On: 12/11/2018 18:35  ° ° °ASSESSMENT AND PLAN: This is a very pleasant 67 years old African-American male recently diagnosed with stage IV (T2b, N2, M1c) non-small cell lung cancer, adenocarcinoma diagnosed in April 2020 and presented with right lower lobe lung mass in   addition to mediastinal lymphadenopathy and metastatic disease to the right eighth rib. °He underwent a course of concurrent chemoradiation to the locally advanced disease in addition to palliative radiotherapy to the solitary right rib bone lesion. °The patient tolerated his treatment well except for  mild odynophagia. °He is currently on systemic chemotherapy with carboplatin, Alimta and Keytruda status post 3 cycles.  °He has been tolerating this treatment well with no concerning complaints but he was recently admitted to [facility] after cycle #3 with fever and treated for questionable pneumonia.  He had CT scan of the chest performed during his hospitalization that showed no concerning findings for disease progression except for the airspace disease in the right lower lobe. °The patient is feeling much better today.  I discussed the scan results with the patient today. °I recommended for him to proceed with cycle #4 today as planned but I will reduce the dose of carboplatin to AUC of 4 and he will not receive Alimta infusion the cycle because of the renal insufficiency. °For the renal insufficiency, I will arrange for the patient to receive IV hydration with normal saline today and he was encouraged to increase his oral intake. °He will come back for follow-up visit in 3 weeks for evaluation before starting cycle #5 of his treatment. °He was advised to call immediately if he has any concerning symptoms in the interval. °The patient voices understanding of current disease status and treatment options and is in agreement with the current care plan. °All questions were answered. The patient knows to call the clinic with any problems, questions or concerns. We can certainly see the patient much sooner if necessary. ° °Disclaimer: This note was dictated with voice recognition software. Similar sounding words can inadvertently be transcribed and may not be corrected upon review. ° ° °  °  °

## 2018-12-29 NOTE — Telephone Encounter (Signed)
Scheduled appt per 8/26 los - Pt to get an updated schedule next visit.

## 2019-01-05 ENCOUNTER — Inpatient Hospital Stay: Payer: Medicare Other | Attending: Internal Medicine

## 2019-01-05 ENCOUNTER — Other Ambulatory Visit: Payer: Self-pay

## 2019-01-05 DIAGNOSIS — C3431 Malignant neoplasm of lower lobe, right bronchus or lung: Secondary | ICD-10-CM | POA: Diagnosis present

## 2019-01-05 DIAGNOSIS — Z5112 Encounter for antineoplastic immunotherapy: Secondary | ICD-10-CM | POA: Insufficient documentation

## 2019-01-05 DIAGNOSIS — C3491 Malignant neoplasm of unspecified part of right bronchus or lung: Secondary | ICD-10-CM

## 2019-01-05 DIAGNOSIS — Z79899 Other long term (current) drug therapy: Secondary | ICD-10-CM | POA: Insufficient documentation

## 2019-01-05 DIAGNOSIS — C7951 Secondary malignant neoplasm of bone: Secondary | ICD-10-CM | POA: Diagnosis not present

## 2019-01-05 DIAGNOSIS — E860 Dehydration: Secondary | ICD-10-CM | POA: Diagnosis not present

## 2019-01-05 DIAGNOSIS — Z5111 Encounter for antineoplastic chemotherapy: Secondary | ICD-10-CM | POA: Diagnosis present

## 2019-01-05 LAB — CMP (CANCER CENTER ONLY)
ALT: 18 U/L (ref 0–44)
AST: 23 U/L (ref 15–41)
Albumin: 2.8 g/dL — ABNORMAL LOW (ref 3.5–5.0)
Alkaline Phosphatase: 87 U/L (ref 38–126)
Anion gap: 10 (ref 5–15)
BUN: 9 mg/dL (ref 8–23)
CO2: 22 mmol/L (ref 22–32)
Calcium: 7.9 mg/dL — ABNORMAL LOW (ref 8.9–10.3)
Chloride: 108 mmol/L (ref 98–111)
Creatinine: 1.72 mg/dL — ABNORMAL HIGH (ref 0.61–1.24)
GFR, Est AFR Am: 46 mL/min — ABNORMAL LOW (ref >60)
GFR, Estimated: 40 mL/min — ABNORMAL LOW (ref >60)
Glucose, Bld: 117 mg/dL — ABNORMAL HIGH (ref 70–99)
Potassium: 4.3 mmol/L (ref 3.5–5.1)
Sodium: 140 mmol/L (ref 135–145)
Total Bilirubin: 0.6 mg/dL (ref 0.3–1.2)
Total Protein: 6.4 g/dL — ABNORMAL LOW (ref 6.5–8.1)

## 2019-01-05 LAB — CBC WITH DIFFERENTIAL (CANCER CENTER ONLY)
Abs Immature Granulocytes: 0.06 10*3/uL (ref 0.00–0.07)
Basophils Absolute: 0.1 10*3/uL (ref 0.0–0.1)
Basophils Relative: 1 %
Eosinophils Absolute: 0.1 10*3/uL (ref 0.0–0.5)
Eosinophils Relative: 1 %
HCT: 29.9 % — ABNORMAL LOW (ref 39.0–52.0)
Hemoglobin: 9.6 g/dL — ABNORMAL LOW (ref 13.0–17.0)
Immature Granulocytes: 1 %
Lymphocytes Relative: 8 %
Lymphs Abs: 0.7 10*3/uL (ref 0.7–4.0)
MCH: 32.1 pg (ref 26.0–34.0)
MCHC: 32.1 g/dL (ref 30.0–36.0)
MCV: 100 fL (ref 80.0–100.0)
Monocytes Absolute: 0.8 10*3/uL (ref 0.1–1.0)
Monocytes Relative: 9 %
Neutro Abs: 7.2 10*3/uL (ref 1.7–7.7)
Neutrophils Relative %: 80 %
Platelet Count: 167 10*3/uL (ref 150–400)
RBC: 2.99 MIL/uL — ABNORMAL LOW (ref 4.22–5.81)
RDW: 16 % — ABNORMAL HIGH (ref 11.5–15.5)
WBC Count: 8.8 10*3/uL (ref 4.0–10.5)
nRBC: 0 % (ref 0.0–0.2)

## 2019-01-12 ENCOUNTER — Other Ambulatory Visit: Payer: Self-pay

## 2019-01-12 ENCOUNTER — Other Ambulatory Visit: Payer: Self-pay | Admitting: *Deleted

## 2019-01-12 ENCOUNTER — Inpatient Hospital Stay: Payer: Medicare Other

## 2019-01-12 DIAGNOSIS — C3491 Malignant neoplasm of unspecified part of right bronchus or lung: Secondary | ICD-10-CM

## 2019-01-12 DIAGNOSIS — Z5112 Encounter for antineoplastic immunotherapy: Secondary | ICD-10-CM | POA: Diagnosis not present

## 2019-01-12 LAB — CBC WITH DIFFERENTIAL (CANCER CENTER ONLY)
Abs Immature Granulocytes: 0.03 10*3/uL (ref 0.00–0.07)
Basophils Absolute: 0.1 10*3/uL (ref 0.0–0.1)
Basophils Relative: 1 %
Eosinophils Absolute: 0.2 10*3/uL (ref 0.0–0.5)
Eosinophils Relative: 2 %
HCT: 30.2 % — ABNORMAL LOW (ref 39.0–52.0)
Hemoglobin: 9.9 g/dL — ABNORMAL LOW (ref 13.0–17.0)
Immature Granulocytes: 0 %
Lymphocytes Relative: 6 %
Lymphs Abs: 0.6 10*3/uL — ABNORMAL LOW (ref 0.7–4.0)
MCH: 32.9 pg (ref 26.0–34.0)
MCHC: 32.8 g/dL (ref 30.0–36.0)
MCV: 100.3 fL — ABNORMAL HIGH (ref 80.0–100.0)
Monocytes Absolute: 0.7 10*3/uL (ref 0.1–1.0)
Monocytes Relative: 8 %
Neutro Abs: 7.7 10*3/uL (ref 1.7–7.7)
Neutrophils Relative %: 83 %
Platelet Count: 172 10*3/uL (ref 150–400)
RBC: 3.01 MIL/uL — ABNORMAL LOW (ref 4.22–5.81)
RDW: 16.4 % — ABNORMAL HIGH (ref 11.5–15.5)
WBC Count: 9.3 10*3/uL (ref 4.0–10.5)
nRBC: 0 % (ref 0.0–0.2)

## 2019-01-12 LAB — CMP (CANCER CENTER ONLY)
ALT: 17 U/L (ref 0–44)
AST: 21 U/L (ref 15–41)
Albumin: 2.9 g/dL — ABNORMAL LOW (ref 3.5–5.0)
Alkaline Phosphatase: 97 U/L (ref 38–126)
Anion gap: 10 (ref 5–15)
BUN: 10 mg/dL (ref 8–23)
CO2: 22 mmol/L (ref 22–32)
Calcium: 8.1 mg/dL — ABNORMAL LOW (ref 8.9–10.3)
Chloride: 109 mmol/L (ref 98–111)
Creatinine: 1.86 mg/dL — ABNORMAL HIGH (ref 0.61–1.24)
GFR, Est AFR Am: 42 mL/min — ABNORMAL LOW (ref >60)
GFR, Estimated: 36 mL/min — ABNORMAL LOW (ref >60)
Glucose, Bld: 158 mg/dL — ABNORMAL HIGH (ref 70–99)
Potassium: 4.5 mmol/L (ref 3.5–5.1)
Sodium: 141 mmol/L (ref 135–145)
Total Bilirubin: 0.4 mg/dL (ref 0.3–1.2)
Total Protein: 6.8 g/dL (ref 6.5–8.1)

## 2019-01-12 MED ORDER — HYDROCOD POLST-CPM POLST ER 10-8 MG/5ML PO SUER: 5.0000 mL | Freq: Two times a day (BID) | ORAL | 0 refills | Status: AC | PRN

## 2019-01-12 NOTE — Telephone Encounter (Signed)
Pt daughter called states pt is having coughing, difficulty sleeping d/t cough, requesting refill on tussionex. Will eview with MD for approval.

## 2019-01-19 ENCOUNTER — Encounter: Payer: Self-pay | Admitting: Internal Medicine

## 2019-01-19 ENCOUNTER — Other Ambulatory Visit: Payer: Self-pay

## 2019-01-19 ENCOUNTER — Telehealth: Payer: Self-pay | Admitting: Internal Medicine

## 2019-01-19 ENCOUNTER — Inpatient Hospital Stay (HOSPITAL_BASED_OUTPATIENT_CLINIC_OR_DEPARTMENT_OTHER): Payer: Medicare Other | Admitting: Internal Medicine

## 2019-01-19 ENCOUNTER — Inpatient Hospital Stay: Payer: Medicare Other

## 2019-01-19 VITALS — BP 100/81 | HR 87 | Temp 98.2°F | Resp 16 | Ht 69.0 in | Wt 157.0 lb

## 2019-01-19 DIAGNOSIS — C3491 Malignant neoplasm of unspecified part of right bronchus or lung: Secondary | ICD-10-CM | POA: Diagnosis not present

## 2019-01-19 DIAGNOSIS — Z95828 Presence of other vascular implants and grafts: Secondary | ICD-10-CM

## 2019-01-19 DIAGNOSIS — Z5112 Encounter for antineoplastic immunotherapy: Secondary | ICD-10-CM

## 2019-01-19 DIAGNOSIS — Z5111 Encounter for antineoplastic chemotherapy: Secondary | ICD-10-CM | POA: Diagnosis not present

## 2019-01-19 LAB — CMP (CANCER CENTER ONLY)
ALT: 11 U/L (ref 0–44)
AST: 14 U/L — ABNORMAL LOW (ref 15–41)
Albumin: 3 g/dL — ABNORMAL LOW (ref 3.5–5.0)
Alkaline Phosphatase: 92 U/L (ref 38–126)
Anion gap: 12 (ref 5–15)
BUN: 15 mg/dL (ref 8–23)
CO2: 20 mmol/L — ABNORMAL LOW (ref 22–32)
Calcium: 8.3 mg/dL — ABNORMAL LOW (ref 8.9–10.3)
Chloride: 108 mmol/L (ref 98–111)
Creatinine: 1.74 mg/dL — ABNORMAL HIGH (ref 0.61–1.24)
GFR, Est AFR Am: 46 mL/min — ABNORMAL LOW (ref >60)
GFR, Estimated: 39 mL/min — ABNORMAL LOW (ref >60)
Glucose, Bld: 136 mg/dL — ABNORMAL HIGH (ref 70–99)
Potassium: 4.6 mmol/L (ref 3.5–5.1)
Sodium: 140 mmol/L (ref 135–145)
Total Bilirubin: 0.5 mg/dL (ref 0.3–1.2)
Total Protein: 6.7 g/dL (ref 6.5–8.1)

## 2019-01-19 LAB — CBC WITH DIFFERENTIAL (CANCER CENTER ONLY)
Abs Immature Granulocytes: 0.02 10*3/uL (ref 0.00–0.07)
Basophils Absolute: 0.1 10*3/uL (ref 0.0–0.1)
Basophils Relative: 1 %
Eosinophils Absolute: 0.2 10*3/uL (ref 0.0–0.5)
Eosinophils Relative: 3 %
HCT: 30.3 % — ABNORMAL LOW (ref 39.0–52.0)
Hemoglobin: 9.9 g/dL — ABNORMAL LOW (ref 13.0–17.0)
Immature Granulocytes: 0 %
Lymphocytes Relative: 13 %
Lymphs Abs: 0.8 10*3/uL (ref 0.7–4.0)
MCH: 32.5 pg (ref 26.0–34.0)
MCHC: 32.7 g/dL (ref 30.0–36.0)
MCV: 99.3 fL (ref 80.0–100.0)
Monocytes Absolute: 0.8 10*3/uL (ref 0.1–1.0)
Monocytes Relative: 13 %
Neutro Abs: 4.6 10*3/uL (ref 1.7–7.7)
Neutrophils Relative %: 70 %
Platelet Count: 158 10*3/uL (ref 150–400)
RBC: 3.05 MIL/uL — ABNORMAL LOW (ref 4.22–5.81)
RDW: 16.5 % — ABNORMAL HIGH (ref 11.5–15.5)
WBC Count: 6.6 10*3/uL (ref 4.0–10.5)
nRBC: 0 % (ref 0.0–0.2)

## 2019-01-19 LAB — TSH: TSH: 0.417 u[IU]/mL (ref 0.320–4.118)

## 2019-01-19 MED ORDER — PROCHLORPERAZINE MALEATE 10 MG PO TABS
10.0000 mg | ORAL_TABLET | Freq: Once | ORAL | Status: AC
  Administered 2019-01-19: 13:00:00 10 mg via ORAL

## 2019-01-19 MED ORDER — SODIUM CHLORIDE 0.9% FLUSH
10.0000 mL | INTRAVENOUS | Status: DC | PRN
  Administered 2019-01-19: 10 mL
  Filled 2019-01-19: qty 10

## 2019-01-19 MED ORDER — SODIUM CHLORIDE 0.9 % IV SOLN
200.0000 mg | Freq: Once | INTRAVENOUS | Status: AC
  Administered 2019-01-19: 200 mg via INTRAVENOUS
  Filled 2019-01-19: qty 8

## 2019-01-19 MED ORDER — HEPARIN SOD (PORK) LOCK FLUSH 100 UNIT/ML IV SOLN
500.0000 [IU] | Freq: Once | INTRAVENOUS | Status: AC | PRN
  Administered 2019-01-19: 15:00:00 500 [IU]
  Filled 2019-01-19: qty 5

## 2019-01-19 MED ORDER — SODIUM CHLORIDE 0.9 % IV SOLN
Freq: Once | INTRAVENOUS | Status: AC
  Administered 2019-01-19: 13:00:00 via INTRAVENOUS
  Filled 2019-01-19: qty 250

## 2019-01-19 MED ORDER — SODIUM CHLORIDE 0.9 % IV SOLN
1000.0000 mg | Freq: Once | INTRAVENOUS | Status: AC
  Administered 2019-01-19: 1000 mg via INTRAVENOUS
  Filled 2019-01-19: qty 40

## 2019-01-19 MED ORDER — PROCHLORPERAZINE MALEATE 10 MG PO TABS
ORAL_TABLET | ORAL | Status: AC
  Filled 2019-01-19: qty 1

## 2019-01-19 NOTE — Telephone Encounter (Signed)
Scheduled appt per 9/16 los - pt to get an updated schedule next visit.

## 2019-01-19 NOTE — Patient Instructions (Signed)
[facility] Discharge Instructions for Patients Receiving Chemotherapy  Today you received the following chemotherapy agents Keytruda and Alimta  To help prevent nausea and vomiting after your treatment, we encourage you to take your nausea medication as prescribed by MD.   If you develop nausea and vomiting that is not controlled by your nausea medication, call the clinic.   BELOW ARE SYMPTOMS THAT SHOULD BE REPORTED IMMEDIATELY:  *FEVER GREATER THAN 100.5 F  *CHILLS WITH OR WITHOUT FEVER  NAUSEA AND VOMITING THAT IS NOT CONTROLLED WITH YOUR NAUSEA MEDICATION  *UNUSUAL SHORTNESS OF BREATH  *UNUSUAL BRUISING OR BLEEDING  TENDERNESS IN MOUTH AND THROAT WITH OR WITHOUT PRESENCE OF ULCERS  *URINARY PROBLEMS  *BOWEL PROBLEMS  UNUSUAL RASH Items with * indicate a potential emergency and should be followed up as soon as possible.  Feel free to call the clinic should you have any questions or concerns. The clinic phone number is [phone].  Please show the [facility] at check-in to the Emergency Department and triage nurse.

## 2019-01-19 NOTE — Progress Notes (Signed)
Reviewed pt labs (CMET) with Dr. Earlie Server and pt ok to treat with Creatinine 1.7

## 2019-01-19 NOTE — Progress Notes (Signed)
[facility] [phone]   [phone]  OFFICE PROGRESS NOTE  Vincente Liberty, MD [place] [address]  DIAGNOSIS: Stage IV (T2b, N2, M1c) non-small cell lung cancer, adenocarcinoma diagnosed in April 2020 and presented with right lower lobe lung mass as well as right hilar and mediastinal lymphadenopathy as well as bone metastasis in the right eighth rib.  Guardant 360 Molecular study: NTRK1 F383F 0.3% Synonymous Alteration  PRIOR THERAPY: Palliative radiotherapy to the right eighth rib in addition to concurrent chemoradiation with weekly carboplatin for AUC of 2 and paclitaxel 45 mg/M2 to the lung mass and mediastinal lymphadenopathy for the locally advanced disease.  Status post 4 cycle of chemotherapy.  Last cycle was given on 09/13/2018.  CURRENT THERAPY: Systemic chemotherapy with carboplatin for AUC of 5, Alimta 500 mg/M2 and Keytruda 200 mg IV every 3 weeks.  First dose October 27, 2018.  Status post 4 cycles.  Starting from cycle #5 the patient will be treated with maintenance therapy with Alimta and Keytruda every 3 weeks.   INTERVAL HISTORY: Zachary Knox 68 y.o. male returns to the clinic today for follow-up visit.  His daughter was available by phone during the visit.  The patient is feeling very well today with no concerning complaints except for mild fatigue and occasional pain on the right side of the chest.  He tolerated the last cycle of his systemic chemotherapy fairly well after reducing the dose of carboplatin.  He denied having any shortness of breath, cough or hemoptysis.  He denied having any fever or chills.  He has no nausea, vomiting, diarrhea or constipation.  He has no headache or visual changes.  He is here today for evaluation before starting cycle #5 of his treatment.    MEDICAL HISTORY: Past Medical History:  Diagnosis Date  . Cancer ([place]) 2020   lung  . Diabetes mellitus without complication  ([place])   . Dyspnea    increased exertion  . Hypertension     ALLERGIES:  has No Known Allergies.  MEDICATIONS:  Current Outpatient Medications  Medication Sig Dispense Refill  . acetaminophen (TYLENOL) 500 MG tablet Take 1,000 mg by mouth every 6 (six) hours as needed for mild pain.     Marland Kitchen albuterol (VENTOLIN) 2 MG/5ML syrup TK 5 ML PO Q 4 H COU OR CHEST TIGHTNESS    . amLODipine (NORVASC) 5 MG tablet Take 5 mg by mouth daily.    Marland Kitchen amoxicillin-clavulanate (AUGMENTIN) 875-125 MG tablet Take 1 tablet by mouth every 12 (twelve) hours. 10 tablet 0  . aspirin EC 81 MG tablet Take 81 mg by mouth daily.     Marland Kitchen atorvastatin (LIPITOR) 20 MG tablet Take 20 mg by mouth daily.    Marland Kitchen azithromycin (ZITHROMAX) 250 MG tablet Take two tablets (500 mg) tongiht 2 tablet 0  . chlorpheniramine-HYDROcodone (TUSSIONEX) 10-8 MG/5ML SUER Take 5 mLs by mouth every 12 (twelve) hours as needed for cough. 824 mL 0  . folic acid (FOLVITE) 1 MG tablet Take 1 tablet (1 mg total) by mouth daily. 30 tablet 4  . gabapentin (NEURONTIN) 400 MG capsule Take 400 mg by mouth at bedtime.     Marland Kitchen levothyroxine (SYNTHROID, LEVOTHROID) 100 MCG tablet Take 100 mcg by mouth daily.    Marland Kitchen lidocaine (XYLOCAINE) 2 % solution Use as directed 15 mLs in the mouth or throat every 6 (six) hours as needed (mouth/throat pain). Swish and swallow (Patient not taking:  Reported on 12/09/2018) 100 mL 2  . metFORMIN (GLUCOPHAGE) 1000 MG tablet Take 500-1,000 mg by mouth See admin instructions. Take 1000 mg every morning and 500 mg at lunch and supper    . metoprolol succinate (TOPROL-XL) 100 MG 24 hr tablet Take 100 mg by mouth daily. Take with or immediately following a meal.    . mirtazapine (REMERON) 7.5 MG tablet TK 1 T PO HS    . pantoprazole (PROTONIX) 40 MG tablet Take 40 mg by mouth 2 (two) times daily.    . potassium chloride SA (K-DUR) 20 MEQ tablet Take 1 tablet (20 mEq total) by mouth 2 (two) times daily. 20 tablet 0  . prochlorperazine  (COMPAZINE) 10 MG tablet Take 1 tablet (10 mg total) by mouth every 6 (six) hours as needed for nausea or vomiting. (Patient not taking: Reported on 08/30/2018) 30 tablet 1  . ramipril (ALTACE) 10 MG capsule Take 10 mg by mouth daily.    . sucralfate (CARAFATE) 1 GM/10ML suspension Take 10 mLs (1 g total) by mouth 3 (three) times daily as needed. (Patient taking differently: Take 1 g by mouth 3 (three) times daily. ) 420 mL 0  . TOUJEO SOLOSTAR 300 UNIT/ML SOPN Inject 18 Units into the skin every morning.     No current facility-administered medications for this visit.     SURGICAL HISTORY:  Past Surgical History:  Procedure Laterality Date  . CARDIAC SURGERY     Heart attack in 2008 with stent placement  . IR IMAGING GUIDED PORT INSERTION  11/01/2018  . stent in heart      REVIEW OF SYSTEMS:  A comprehensive review of systems was negative except for: Constitutional: positive for fatigue Respiratory: positive for pleurisy/chest pain   PHYSICAL EXAMINATION: General appearance: alert, cooperative, fatigued and no distress Head: Normocephalic, without obvious abnormality, atraumatic Neck: no adenopathy, no JVD, supple, symmetrical, trachea midline and thyroid not enlarged, symmetric, no tenderness/mass/nodules Lymph nodes: Cervical, supraclavicular, and axillary nodes normal. Resp: clear to auscultation bilaterally Back: symmetric, no curvature. ROM normal. No CVA tenderness. Cardio: regular rate and rhythm, S1, S2 normal, no murmur, click, rub or gallop GI: soft, non-tender; bowel sounds normal; no masses,  no organomegaly Extremities: extremities normal, atraumatic, no cyanosis or edema  ECOG PERFORMANCE STATUS: 1 - Symptomatic but completely ambulatory  Blood pressure 100/81, pulse 87, temperature 98.2 F (36.8 C), temperature source Temporal, resp. rate 16, height '5\' 9"'  (1.753 m), weight 157 lb (71.2 kg), SpO2 100 %.  LABORATORY DATA: Lab Results  Component Value Date   WBC 6.6  01/19/2019   HGB 9.9 (L) 01/19/2019   HCT 30.3 (L) 01/19/2019   MCV 99.3 01/19/2019   PLT 158 01/19/2019      Chemistry      Component Value Date/Time   NA 140 01/19/2019 1148   K 4.6 01/19/2019 1148   CL 108 01/19/2019 1148   CO2 20 (L) 01/19/2019 1148   BUN 15 01/19/2019 1148   CREATININE 1.74 (H) 01/19/2019 1148      Component Value Date/Time   CALCIUM 8.3 (L) 01/19/2019 1148   ALKPHOS 92 01/19/2019 1148   AST 14 (L) 01/19/2019 1148   ALT 11 01/19/2019 1148   BILITOT 0.5 01/19/2019 1148       RADIOGRAPHIC STUDIES: No results found.  ASSESSMENT AND PLAN: This is a very pleasant 68 years old African-American male recently diagnosed with stage IV (T2b, N2, M1c) non-small cell lung cancer, adenocarcinoma diagnosed in April 2020 and  presented with right lower lobe lung mass in addition to mediastinal lymphadenopathy and metastatic disease to the right eighth rib. He underwent a course of concurrent chemoradiation to the locally advanced disease in addition to palliative radiotherapy to the solitary right rib bone lesion. The patient tolerated his treatment well except for mild odynophagia. He is currently on systemic chemotherapy with carboplatin, Alimta and Keytruda status post 4 cycles.  The patient tolerated the last cycle of his treatment much better. I recommended for him to proceed with the first cycle of his maintenance treatment with Alimta and Keytruda every 3 weeks. He will come back for follow-up visit in 3 weeks for evaluation before the next cycle of his treatment. For the renal insufficiency, I I encouraged the patient to increase his oral intake.  We will continue to monitor his renal function closely. He was advised to call immediately if he has any concerning symptoms in the interval. The patient voices understanding of current disease status and treatment options and is in agreement with the current care plan. All questions were answered. The patient knows to  call the clinic with any problems, questions or concerns. We can certainly see the patient much sooner if necessary.  Disclaimer: This note was dictated with voice recognition software. Similar sounding words can inadvertently be transcribed and may not be corrected upon review.

## 2019-01-24 ENCOUNTER — Telehealth: Payer: Self-pay | Admitting: Internal Medicine

## 2019-01-24 ENCOUNTER — Telehealth: Payer: Self-pay | Admitting: *Deleted

## 2019-01-24 DIAGNOSIS — C3491 Malignant neoplasm of unspecified part of right bronchus or lung: Secondary | ICD-10-CM

## 2019-01-24 NOTE — Telephone Encounter (Signed)
Scheduled appt per 9/21 sch message - pt aware of appt date and time

## 2019-01-24 NOTE — Telephone Encounter (Signed)
[name] ( wife) called states pt c/o headache, seeing someone, talking to them, bad hiccups. No fever, no n/v, not eating and no chills or night sweats."  Pt rcvd Alimta Keytruda 9/16. Reviewed with MD, pt to see [facility] tomorrow with LAB and IVF. Orders entered. Message to scheduling.

## 2019-01-25 ENCOUNTER — Ambulatory Visit: Payer: Medicare Other

## 2019-01-25 ENCOUNTER — Inpatient Hospital Stay: Payer: Medicare Other

## 2019-01-25 ENCOUNTER — Other Ambulatory Visit: Payer: Self-pay

## 2019-01-25 ENCOUNTER — Inpatient Hospital Stay (HOSPITAL_BASED_OUTPATIENT_CLINIC_OR_DEPARTMENT_OTHER): Payer: Medicare Other | Admitting: Medical

## 2019-01-25 VITALS — BP 100/79 | HR 108 | Temp 98.0°F | Resp 18 | Ht 69.0 in | Wt 152.7 lb

## 2019-01-25 DIAGNOSIS — Z95828 Presence of other vascular implants and grafts: Secondary | ICD-10-CM | POA: Diagnosis not present

## 2019-01-25 DIAGNOSIS — C3491 Malignant neoplasm of unspecified part of right bronchus or lung: Secondary | ICD-10-CM | POA: Diagnosis not present

## 2019-01-25 DIAGNOSIS — A419 Sepsis, unspecified organism: Secondary | ICD-10-CM | POA: Diagnosis not present

## 2019-01-25 DIAGNOSIS — C7951 Secondary malignant neoplasm of bone: Secondary | ICD-10-CM

## 2019-01-25 DIAGNOSIS — R0602 Shortness of breath: Secondary | ICD-10-CM | POA: Diagnosis not present

## 2019-01-25 DIAGNOSIS — E860 Dehydration: Secondary | ICD-10-CM

## 2019-01-25 LAB — CBC WITH DIFFERENTIAL (CANCER CENTER ONLY)
Abs Immature Granulocytes: 0 10*3/uL (ref 0.00–0.07)
Band Neutrophils: 1 %
Basophils Absolute: 0 10*3/uL (ref 0.0–0.1)
Basophils Relative: 0 %
Eosinophils Absolute: 0.2 10*3/uL (ref 0.0–0.5)
Eosinophils Relative: 3 %
HCT: 29.1 % — ABNORMAL LOW (ref 39.0–52.0)
Hemoglobin: 9.7 g/dL — ABNORMAL LOW (ref 13.0–17.0)
Lymphocytes Relative: 8 %
Lymphs Abs: 0.4 10*3/uL — ABNORMAL LOW (ref 0.7–4.0)
MCH: 32.6 pg (ref 26.0–34.0)
MCHC: 33.3 g/dL (ref 30.0–36.0)
MCV: 97.7 fL (ref 80.0–100.0)
Monocytes Absolute: 0.1 10*3/uL (ref 0.1–1.0)
Monocytes Relative: 2 %
Neutro Abs: 4.4 10*3/uL (ref 1.7–17.7)
Neutrophils Relative %: 86 %
Platelet Count: 61 10*3/uL — ABNORMAL LOW (ref 150–400)
RBC: 2.98 MIL/uL — ABNORMAL LOW (ref 4.22–5.81)
RDW: 16 % — ABNORMAL HIGH (ref 11.5–15.5)
WBC Count: 5.1 10*3/uL (ref 4.0–10.5)
nRBC: 0 % (ref 0.0–0.2)

## 2019-01-25 LAB — CMP (CANCER CENTER ONLY)
ALT: 12 U/L (ref 0–44)
AST: 21 U/L (ref 15–41)
Albumin: 2.8 g/dL — ABNORMAL LOW (ref 3.5–5.0)
Alkaline Phosphatase: 89 U/L (ref 38–126)
Anion gap: 13 (ref 5–15)
BUN: 24 mg/dL — ABNORMAL HIGH (ref 8–23)
CO2: 19 mmol/L — ABNORMAL LOW (ref 22–32)
Calcium: 9.5 mg/dL (ref 8.9–10.3)
Chloride: 105 mmol/L (ref 98–111)
Creatinine: 1.91 mg/dL — ABNORMAL HIGH (ref 0.61–1.24)
GFR, Est AFR Am: 41 mL/min — ABNORMAL LOW (ref >60)
GFR, Estimated: 35 mL/min — ABNORMAL LOW (ref >60)
Glucose, Bld: 184 mg/dL — ABNORMAL HIGH (ref 70–99)
Potassium: 5.1 mmol/L (ref 3.5–5.1)
Sodium: 137 mmol/L (ref 135–145)
Total Bilirubin: 1.5 mg/dL — ABNORMAL HIGH (ref 0.3–1.2)
Total Protein: 7.3 g/dL (ref 6.5–8.1)

## 2019-01-25 LAB — SAMPLE TO BLOOD BANK

## 2019-01-25 MED ORDER — SODIUM CHLORIDE 0.9% FLUSH
10.0000 mL | INTRAVENOUS | Status: DC | PRN
  Administered 2019-01-25: 14:00:00 10 mL
  Filled 2019-01-25: qty 10

## 2019-01-25 MED ORDER — HEPARIN SOD (PORK) LOCK FLUSH 100 UNIT/ML IV SOLN
500.0000 [IU] | Freq: Once | INTRAVENOUS | Status: AC | PRN
  Administered 2019-01-25: 14:00:00 500 [IU]
  Filled 2019-01-25: qty 5

## 2019-01-25 MED ORDER — SODIUM CHLORIDE 0.9 % IV SOLN
Freq: Once | INTRAVENOUS | Status: AC
  Administered 2019-01-25: 12:00:00 via INTRAVENOUS
  Filled 2019-01-25: qty 250

## 2019-01-25 NOTE — Progress Notes (Signed)
Pt reports feeling better at end of IVF infusion, tolerated well.  Drank some fluid during.  VSS.  Denies any questions or concerns at time of d/c, aware to f/u as needed and of scheduling message to call him tomorrow to schedule additional IVF infusion appt on Thursday or Friday of this week per MD [facility].

## 2019-01-25 NOTE — Patient Instructions (Signed)

## 2019-01-26 ENCOUNTER — Inpatient Hospital Stay: Payer: Medicare Other

## 2019-01-26 ENCOUNTER — Telehealth: Payer: Self-pay | Admitting: Internal Medicine

## 2019-01-26 NOTE — Progress Notes (Signed)
[facility] Clinic Progress Note   Zachary Knox [id number] Jan 16, 1951 68 y.o.  Zachary Knox is managed by Dr. Fanny Bien. Zachary Knox  Actively treated with chemotherapy/immunotherapy/hormonal therapy: yes  Current therapy: Keytruda and Alimta  Last treated: 01/19/2019 (cycle 5, day 1)  Next scheduled appointment with provider: 02/09/2019  Assessment: Plan:    Dehydration - Plan: 0.9 %  sodium chloride infusion  Port-A-Cath in place - Plan: heparin lock flush 100 unit/mL, sodium chloride flush (NS) 0.9 % injection 10 mL  Osseous metastasis (HCC)  Adenocarcinoma of right lung, stage 4 (HCC)   Dehydration: Patient was given 1 L of normal saline IV today.  He was started on 009/24/202024 another 1 L of normal saline.  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  He is scheduled to see Dr. Julien Knox in follow-up on 02/09/2019.  Please see After Visit Summary for patient specific instructions.  Future Appointments  Date Time Provider [place]  01/04/2019  8:30 AM [facility] INFUSION [facility] None  02/02/2019  2:30 PM [facility] LAB 3 [facility] None  02/09/2019 10:45 AM [facility] LAB 1 [facility] None  02/09/2019 11:00 AM [facility] [place] FLUSH [facility] None  02/09/2019 11:30 AM Curt Bears, MD [facility] None  02/09/2019 12:00 PM [facility] INFUSION [facility] None  03/02/2019  9:15 AM [facility] LAB ONLY [facility] None  03/02/2019  9:30 AM [facility] [place] FLUSH [facility] None  03/02/2019 10:00 AM Curt Bears, MD [facility] None  03/02/2019 11:00 AM [facility] INFUSION [facility] None  03/23/2019  9:15 AM [facility] LAB 5 [facility] None  03/23/2019  9:30 AM [facility] [place] FLUSH [facility] None  03/23/2019 10:00 AM Curt Bears, MD [facility] None  03/23/2019 11:00 AM [facility] INFUSION [facility] None    No orders  of the defined types were placed in this encounter.      Subjective:   Patient ID:  Zachary Knox is a 68 y.o. (DOB [birth year]) male.  Chief Complaint:  Chief Complaint  Patient presents with  . Fatigue    HPI Zachary Knox Is a 68 y.o. male with a diagnosis of a metastatic adenocarcinoma of the lung with bony metastatic disease who is managed by Dr. Julien Knox.  He is status post cycle 5, day 1 of Keytruda and Alimta which was dosed on 01/19/2019.  He presents to the clinic today with anorexia, fatigue, dizziness, weakness, and nausea this morning.  He had a recent fall last week but had no injury.  He simply stated he missed his cell phone fell onto the floor.  He landed on his buttocks.  He had no head injury with no loss of consciousness.  His wife was called during his appointment today and states that the patient has had intermittent confusion he has been talking in his sleep at night and carry not complete conversations.  He has ongoing hiccups, a cough, is weak, and has anorexia.  Medications: I have reviewed the patient's current medications.  Allergies: No Known Allergies  Past Medical History:  Diagnosis Date  . Cancer ([place]) 2020   lung  . Diabetes mellitus without complication ([place])   . Dyspnea    increased exertion  . Hypertension     Past Surgical History:  Procedure Laterality Date  . CARDIAC SURGERY     Heart attack in 2008 with stent placement  . IR IMAGING GUIDED PORT INSERTION  11/01/2018  . stent  in heart      No family history on file.  Social History   Socioeconomic History  . Marital status: Married    Spouse name: Not on file  . Number of children: Not on file  . Years of education: Not on file  . Highest education level: Not on file  Occupational History  . Not on file  Social Needs  . Financial resource strain: Not on file  . Food insecurity    Worry: Not on file    Inability: Not on file  . Transportation needs    Medical: Not on  file    Non-medical: Not on file  Tobacco Use  . Smoking status: Former Smoker    Types: Cigarettes  . Smokeless tobacco: Never Used  Substance and Sexual Activity  . Alcohol use: Not Currently    Comment: occassional   . Drug use: Never  . Sexual activity: Not on file  Lifestyle  . Physical activity    Days per week: Not on file    Minutes per session: Not on file  . Stress: Not on file  Relationships  . Social Herbalist on phone: Not on file    Gets together: Not on file    Attends religious service: Not on file    Active member of club or organization: Not on file    Attends meetings of clubs or organizations: Not on file    Relationship status: Not on file  . Intimate partner violence    Fear of current or ex partner: Not on file    Emotionally abused: Not on file    Physically abused: Not on file    Forced sexual activity: Not on file  Other Topics Concern  . Not on file  Social History Narrative  . Not on file    Past Medical History, Surgical history, Social history, and Family history were reviewed and updated as appropriate.   Please see review of systems for further details on the patient's review from today.   Review of Systems:  Review of Systems  Constitutional: Positive for appetite change. Negative for chills, diaphoresis and fever.  HENT: Negative for dental problem, mouth sores and trouble swallowing.   Respiratory: Negative for cough, chest tightness and shortness of breath.   Cardiovascular: Negative for chest pain and palpitations.  Gastrointestinal: Positive for nausea. Negative for constipation, diarrhea and vomiting.  Neurological: Positive for dizziness and weakness. Negative for syncope and headaches.  Psychiatric/Behavioral: Positive for confusion.    Objective:   Physical Exam:  BP 100/79 (BP Location: Right Arm, Patient Position: Sitting)   Pulse (!) 108   Temp 98 F (36.7 C)   Resp 18   Ht 5\' 9"  (1.753 m)   Wt 152 lb  11.2 oz (69.3 kg)   SpO2 100%   BMI 22.55 kg/m  ECOG: 1  Physical Exam Constitutional:      General: He is not in acute distress.    Appearance: He is not diaphoretic.  HENT:     Head: Normocephalic and atraumatic.  Cardiovascular:     Rate and Rhythm: Normal rate and regular rhythm.     Heart sounds: Normal heart sounds. No murmur. No friction rub. No gallop.   Pulmonary:     Effort: Pulmonary effort is normal. No respiratory distress.     Breath sounds: Normal breath sounds. No wheezing or rales.  Skin:    General: Skin is warm and dry.  Findings: No erythema or rash.  Neurological:     Mental Status: He is alert.  Psychiatric:        Mood and Affect: Mood normal.        Behavior: Behavior normal.     Lab Review:     Component Value Date/Time   NA 137 01/25/2019 1142   K 5.1 01/25/2019 1142   CL 105 01/25/2019 1142   CO2 19 (L) 01/25/2019 1142   GLUCOSE 184 (H) 01/25/2019 1142   BUN 24 (H) 01/25/2019 1142   CREATININE 1.91 (H) 01/25/2019 1142   CALCIUM 9.5 01/25/2019 1142   PROT 7.3 01/25/2019 1142   ALBUMIN 2.8 (L) 01/25/2019 1142   AST 21 01/25/2019 1142   ALT 12 01/25/2019 1142   ALKPHOS 89 01/25/2019 1142   BILITOT 1.5 (H) 01/25/2019 1142   GFRNONAA 35 (L) 01/25/2019 1142   GFRAA 41 (L) 01/25/2019 1142       Component Value Date/Time   WBC 5.1 01/25/2019 1142   WBC 2.7 (L) 12/13/2018 0605   RBC 2.98 (L) 01/25/2019 1142   HGB 9.7 (L) 01/25/2019 1142   HCT 29.1 (L) 01/25/2019 1142   PLT 61 (L) 01/25/2019 1142   MCV 97.7 01/25/2019 1142   MCH 32.6 01/25/2019 1142   MCHC 33.3 01/25/2019 1142   RDW 16.0 (H) 01/25/2019 1142   LYMPHSABS 0.4 (L) 01/25/2019 1142   MONOABS 0.1 01/25/2019 1142   EOSABS 0.2 01/25/2019 1142   BASOSABS 0.0 01/25/2019 1142   -------------------------------  Imaging from last 24 hours (if applicable):  Radiology interpretation: No results found.      This patient was seen with Dr. Julien Knox with my treatment plan  reviewed with him. He expressed agreement with my medical management of this patient.  ADDENDUM: Hematology/Oncology Attending: I had a face-to-face encounter with the patient.  I recommended his care plan.  This is a very pleasant 68 years old African-American male with a stage IV non-small cell lung cancer, adenocarcinoma status post induction systemic chemotherapy with carboplatin, Alimta and Keytruda and he is currently on maintenance treatment with Alimta and Keytruda.  He has been tolerating his treatment well except for lack of appetite and poor p.o. intake.  The patient came to the clinic today complaining of nausea, vomiting as well as fatigue and dehydration. We arranged for the patient to receive IV fluid in the clinic today. We will bring him back in a few days for another liter of normal saline in the clinic.  He was encouraged to increase his oral intake. The patient was advised to call immediately if he has any concerning symptoms in the interval.  Disclaimer: This note was dictated with voice recognition software. Similar sounding words can inadvertently be transcribed and may be missed upon review. Eilleen Kempf, MD 01/27/19

## 2019-01-26 NOTE — Telephone Encounter (Signed)
Scheduled appt per 9/22 sch message - pt wife aware of appt

## 2019-01-27 ENCOUNTER — Other Ambulatory Visit: Payer: Self-pay | Admitting: *Deleted

## 2019-01-27 ENCOUNTER — Encounter: Payer: Self-pay | Admitting: Medical

## 2019-01-27 DIAGNOSIS — C3491 Malignant neoplasm of unspecified part of right bronchus or lung: Secondary | ICD-10-CM

## 2019-01-27 MED ORDER — CHLORPROMAZINE HCL 25 MG PO TABS: 25.0000 mg | ORAL_TABLET | Freq: Three times a day (TID) | ORAL | 1 refills | Status: AC

## 2019-01-27 NOTE — Progress Notes (Signed)
Daughter Tiffany called, Pt falling, balance issues due to fatigue. Review with MD,Amb refferal to [facility] for evaluation.

## 2019-01-27 NOTE — Progress Notes (Signed)
Rx for thorazine sent to pharmacy

## 2019-01-28 ENCOUNTER — Emergency Department (HOSPITAL_COMMUNITY): Payer: Medicare Other

## 2019-01-28 ENCOUNTER — Encounter (HOSPITAL_COMMUNITY): Payer: Self-pay | Admitting: Emergency Medicine

## 2019-01-28 ENCOUNTER — Inpatient Hospital Stay: Payer: Medicare Other

## 2019-01-28 ENCOUNTER — Other Ambulatory Visit: Payer: Self-pay

## 2019-01-28 ENCOUNTER — Other Ambulatory Visit: Payer: Self-pay | Admitting: Hematology

## 2019-01-28 ENCOUNTER — Telehealth: Payer: Self-pay | Admitting: *Deleted

## 2019-01-28 ENCOUNTER — Inpatient Hospital Stay (HOSPITAL_COMMUNITY)
Admission: EM | Admit: 2019-01-28 | Discharge: 2019-03-06 | DRG: 871 | Disposition: E | Payer: Medicare Other | Attending: Student | Admitting: Student

## 2019-01-28 ENCOUNTER — Other Ambulatory Visit: Payer: Self-pay | Admitting: Emergency Medicine

## 2019-01-28 VITALS — BP 77/57 | HR 99 | Temp 97.7°F | Resp 20

## 2019-01-28 DIAGNOSIS — Z794 Long term (current) use of insulin: Secondary | ICD-10-CM | POA: Diagnosis not present

## 2019-01-28 DIAGNOSIS — Z7982 Long term (current) use of aspirin: Secondary | ICD-10-CM | POA: Diagnosis not present

## 2019-01-28 DIAGNOSIS — Z79890 Hormone replacement therapy (postmenopausal): Secondary | ICD-10-CM

## 2019-01-28 DIAGNOSIS — Z95828 Presence of other vascular implants and grafts: Secondary | ICD-10-CM

## 2019-01-28 DIAGNOSIS — E039 Hypothyroidism, unspecified: Secondary | ICD-10-CM | POA: Diagnosis present

## 2019-01-28 DIAGNOSIS — E870 Hyperosmolality and hypernatremia: Secondary | ICD-10-CM | POA: Diagnosis not present

## 2019-01-28 DIAGNOSIS — Y95 Nosocomial condition: Secondary | ICD-10-CM | POA: Diagnosis present

## 2019-01-28 DIAGNOSIS — Z20828 Contact with and (suspected) exposure to other viral communicable diseases: Secondary | ICD-10-CM | POA: Diagnosis present

## 2019-01-28 DIAGNOSIS — I252 Old myocardial infarction: Secondary | ICD-10-CM

## 2019-01-28 DIAGNOSIS — E1122 Type 2 diabetes mellitus with diabetic chronic kidney disease: Secondary | ICD-10-CM | POA: Diagnosis present

## 2019-01-28 DIAGNOSIS — R748 Abnormal levels of other serum enzymes: Secondary | ICD-10-CM | POA: Diagnosis not present

## 2019-01-28 DIAGNOSIS — N183 Chronic kidney disease, stage 3 unspecified: Secondary | ICD-10-CM | POA: Diagnosis present

## 2019-01-28 DIAGNOSIS — D61810 Antineoplastic chemotherapy induced pancytopenia: Secondary | ICD-10-CM | POA: Diagnosis present

## 2019-01-28 DIAGNOSIS — E875 Hyperkalemia: Secondary | ICD-10-CM | POA: Diagnosis present

## 2019-01-28 DIAGNOSIS — R627 Adult failure to thrive: Secondary | ICD-10-CM | POA: Diagnosis present

## 2019-01-28 DIAGNOSIS — E871 Hypo-osmolality and hyponatremia: Secondary | ICD-10-CM | POA: Diagnosis not present

## 2019-01-28 DIAGNOSIS — G893 Neoplasm related pain (acute) (chronic): Secondary | ICD-10-CM | POA: Diagnosis not present

## 2019-01-28 DIAGNOSIS — G9341 Metabolic encephalopathy: Secondary | ICD-10-CM | POA: Diagnosis not present

## 2019-01-28 DIAGNOSIS — D696 Thrombocytopenia, unspecified: Secondary | ICD-10-CM

## 2019-01-28 DIAGNOSIS — R6520 Severe sepsis without septic shock: Secondary | ICD-10-CM | POA: Diagnosis present

## 2019-01-28 DIAGNOSIS — J189 Pneumonia, unspecified organism: Secondary | ICD-10-CM | POA: Diagnosis present

## 2019-01-28 DIAGNOSIS — C7951 Secondary malignant neoplasm of bone: Secondary | ICD-10-CM | POA: Diagnosis present

## 2019-01-28 DIAGNOSIS — D709 Neutropenia, unspecified: Secondary | ICD-10-CM | POA: Diagnosis present

## 2019-01-28 DIAGNOSIS — R7989 Other specified abnormal findings of blood chemistry: Secondary | ICD-10-CM

## 2019-01-28 DIAGNOSIS — C3491 Malignant neoplasm of unspecified part of right bronchus or lung: Secondary | ICD-10-CM | POA: Diagnosis present

## 2019-01-28 DIAGNOSIS — E119 Type 2 diabetes mellitus without complications: Secondary | ICD-10-CM | POA: Diagnosis not present

## 2019-01-28 DIAGNOSIS — IMO0001 Reserved for inherently not codable concepts without codable children: Secondary | ICD-10-CM

## 2019-01-28 DIAGNOSIS — E1165 Type 2 diabetes mellitus with hyperglycemia: Secondary | ICD-10-CM | POA: Diagnosis present

## 2019-01-28 DIAGNOSIS — Z515 Encounter for palliative care: Secondary | ICD-10-CM | POA: Diagnosis not present

## 2019-01-28 DIAGNOSIS — Z955 Presence of coronary angioplasty implant and graft: Secondary | ICD-10-CM | POA: Diagnosis not present

## 2019-01-28 DIAGNOSIS — B49 Unspecified mycosis: Secondary | ICD-10-CM | POA: Diagnosis not present

## 2019-01-28 DIAGNOSIS — E860 Dehydration: Secondary | ICD-10-CM

## 2019-01-28 DIAGNOSIS — N179 Acute kidney failure, unspecified: Secondary | ICD-10-CM | POA: Diagnosis present

## 2019-01-28 DIAGNOSIS — D6481 Anemia due to antineoplastic chemotherapy: Secondary | ICD-10-CM | POA: Diagnosis not present

## 2019-01-28 DIAGNOSIS — T451X5A Adverse effect of antineoplastic and immunosuppressive drugs, initial encounter: Secondary | ICD-10-CM | POA: Diagnosis present

## 2019-01-28 DIAGNOSIS — A419 Sepsis, unspecified organism: Secondary | ICD-10-CM | POA: Diagnosis present

## 2019-01-28 DIAGNOSIS — E872 Acidosis, unspecified: Secondary | ICD-10-CM | POA: Diagnosis present

## 2019-01-28 DIAGNOSIS — R0600 Dyspnea, unspecified: Secondary | ICD-10-CM

## 2019-01-28 DIAGNOSIS — Z79899 Other long term (current) drug therapy: Secondary | ICD-10-CM | POA: Diagnosis not present

## 2019-01-28 DIAGNOSIS — E785 Hyperlipidemia, unspecified: Secondary | ICD-10-CM | POA: Diagnosis present

## 2019-01-28 DIAGNOSIS — I129 Hypertensive chronic kidney disease with stage 1 through stage 4 chronic kidney disease, or unspecified chronic kidney disease: Secondary | ICD-10-CM | POA: Diagnosis present

## 2019-01-28 DIAGNOSIS — Z7189 Other specified counseling: Secondary | ICD-10-CM | POA: Diagnosis not present

## 2019-01-28 DIAGNOSIS — N289 Disorder of kidney and ureter, unspecified: Secondary | ICD-10-CM | POA: Diagnosis not present

## 2019-01-28 DIAGNOSIS — Z720 Tobacco use: Secondary | ICD-10-CM | POA: Diagnosis present

## 2019-01-28 DIAGNOSIS — Z66 Do not resuscitate: Secondary | ICD-10-CM | POA: Diagnosis present

## 2019-01-28 DIAGNOSIS — R5081 Fever presenting with conditions classified elsewhere: Secondary | ICD-10-CM | POA: Diagnosis present

## 2019-01-28 DIAGNOSIS — Z87891 Personal history of nicotine dependence: Secondary | ICD-10-CM | POA: Diagnosis not present

## 2019-01-28 DIAGNOSIS — L89152 Pressure ulcer of sacral region, stage 2: Secondary | ICD-10-CM | POA: Diagnosis present

## 2019-01-28 DIAGNOSIS — K5900 Constipation, unspecified: Secondary | ICD-10-CM

## 2019-01-28 DIAGNOSIS — L8990 Pressure ulcer of unspecified site, unspecified stage: Secondary | ICD-10-CM | POA: Insufficient documentation

## 2019-01-28 DIAGNOSIS — R0602 Shortness of breath: Secondary | ICD-10-CM | POA: Diagnosis present

## 2019-01-28 DIAGNOSIS — D61818 Other pancytopenia: Secondary | ICD-10-CM | POA: Diagnosis not present

## 2019-01-28 LAB — CBC WITH DIFFERENTIAL/PLATELET
Abs Immature Granulocytes: 0 10*3/uL (ref 0.00–0.07)
Basophils Absolute: 0 10*3/uL (ref 0.0–0.1)
Basophils Relative: 0 %
Eosinophils Absolute: 0 10*3/uL (ref 0.0–0.5)
Eosinophils Relative: 11 %
HCT: 22.9 % — ABNORMAL LOW (ref 39.0–52.0)
Hemoglobin: 7.4 g/dL — ABNORMAL LOW (ref 13.0–17.0)
Immature Granulocytes: 0 %
Lymphocytes Relative: 19 %
Lymphs Abs: 0.1 10*3/uL — ABNORMAL LOW (ref 0.7–4.0)
MCH: 33.2 pg (ref 26.0–34.0)
MCHC: 32.3 g/dL (ref 30.0–36.0)
MCV: 102.7 fL — ABNORMAL HIGH (ref 80.0–100.0)
Monocytes Absolute: 0 10*3/uL — ABNORMAL LOW (ref 0.1–1.0)
Monocytes Relative: 15 %
Neutro Abs: 0.2 10*3/uL — ABNORMAL LOW (ref 1.7–7.7)
Neutrophils Relative %: 55 %
Platelets: 12 10*3/uL — CL (ref 150–400)
RBC: 2.23 MIL/uL — ABNORMAL LOW (ref 4.22–5.81)
RDW: 15.9 % — ABNORMAL HIGH (ref 11.5–15.5)
WBC: 0.3 10*3/uL — CL (ref 4.0–10.5)
nRBC: 0 % (ref 0.0–0.2)

## 2019-01-28 LAB — URINALYSIS, ROUTINE W REFLEX MICROSCOPIC
Bilirubin Urine: NEGATIVE
Glucose, UA: NEGATIVE mg/dL
Hgb urine dipstick: NEGATIVE
Ketones, ur: NEGATIVE mg/dL
Leukocytes,Ua: NEGATIVE
Nitrite: NEGATIVE
Protein, ur: NEGATIVE mg/dL
Specific Gravity, Urine: 1.01 (ref 1.005–1.030)
pH: 5 (ref 5.0–8.0)

## 2019-01-28 LAB — BASIC METABOLIC PANEL
Anion gap: 12 (ref 5–15)
BUN: 54 mg/dL — ABNORMAL HIGH (ref 8–23)
CO2: 15 mmol/L — ABNORMAL LOW (ref 22–32)
Calcium: 8.1 mg/dL — ABNORMAL LOW (ref 8.9–10.3)
Chloride: 107 mmol/L (ref 98–111)
Creatinine, Ser: 3.66 mg/dL — ABNORMAL HIGH (ref 0.61–1.24)
GFR calc Af Amer: 19 mL/min — ABNORMAL LOW (ref >60)
GFR calc non Af Amer: 16 mL/min — ABNORMAL LOW (ref >60)
Glucose, Bld: 170 mg/dL — ABNORMAL HIGH (ref 70–99)
Potassium: 5.3 mmol/L — ABNORMAL HIGH (ref 3.5–5.1)
Sodium: 134 mmol/L — ABNORMAL LOW (ref 135–145)

## 2019-01-28 LAB — CBC WITH DIFFERENTIAL (CANCER CENTER ONLY)
Abs Immature Granulocytes: 0.01 10*3/uL (ref 0.00–0.07)
Basophils Absolute: 0 10*3/uL (ref 0.0–0.1)
Basophils Relative: 0 %
Eosinophils Absolute: 0.1 10*3/uL (ref 0.0–0.5)
Eosinophils Relative: 12 %
HCT: 24.3 % — ABNORMAL LOW (ref 39.0–52.0)
Hemoglobin: 7.7 g/dL — ABNORMAL LOW (ref 13.0–17.0)
Immature Granulocytes: 2 %
Lymphocytes Relative: 37 %
Lymphs Abs: 0.2 10*3/uL — ABNORMAL LOW (ref 0.7–4.0)
MCH: 32.1 pg (ref 26.0–34.0)
MCHC: 31.7 g/dL (ref 30.0–36.0)
MCV: 101.3 fL — ABNORMAL HIGH (ref 80.0–100.0)
Monocytes Absolute: 0.1 10*3/uL (ref 0.1–1.0)
Monocytes Relative: 12 %
Neutro Abs: 0.2 10*3/uL — CL (ref 1.7–7.7)
Neutrophils Relative %: 37 %
Platelet Count: 14 10*3/uL — ABNORMAL LOW (ref 150–400)
RBC: 2.4 MIL/uL — ABNORMAL LOW (ref 4.22–5.81)
RDW: 15.7 % — ABNORMAL HIGH (ref 11.5–15.5)
WBC Count: 0.4 10*3/uL — CL (ref 4.0–10.5)
nRBC: 0 % (ref 0.0–0.2)

## 2019-01-28 LAB — CMP (CANCER CENTER ONLY)
ALT: 9 U/L (ref 0–44)
AST: 18 U/L (ref 15–41)
Albumin: 2.3 g/dL — ABNORMAL LOW (ref 3.5–5.0)
Alkaline Phosphatase: 73 U/L (ref 38–126)
Anion gap: 15 (ref 5–15)
BUN: 50 mg/dL — ABNORMAL HIGH (ref 8–23)
CO2: 15 mmol/L — ABNORMAL LOW (ref 22–32)
Calcium: 8.7 mg/dL — ABNORMAL LOW (ref 8.9–10.3)
Chloride: 105 mmol/L (ref 98–111)
Creatinine: 3.5 mg/dL (ref 0.61–1.24)
GFR, Est AFR Am: 20 mL/min — ABNORMAL LOW (ref >60)
GFR, Estimated: 17 mL/min — ABNORMAL LOW (ref >60)
Glucose, Bld: 172 mg/dL — ABNORMAL HIGH (ref 70–99)
Potassium: 5.4 mmol/L — ABNORMAL HIGH (ref 3.5–5.1)
Sodium: 135 mmol/L (ref 135–145)
Total Bilirubin: 1 mg/dL (ref 0.3–1.2)
Total Protein: 6.5 g/dL (ref 6.5–8.1)

## 2019-01-28 LAB — HEPATIC FUNCTION PANEL
ALT: 15 U/L (ref 0–44)
AST: 20 U/L (ref 15–41)
Albumin: 2.4 g/dL — ABNORMAL LOW (ref 3.5–5.0)
Alkaline Phosphatase: 58 U/L (ref 38–126)
Bilirubin, Direct: 0.4 mg/dL — ABNORMAL HIGH (ref 0.0–0.2)
Indirect Bilirubin: 0.9 mg/dL (ref 0.3–0.9)
Total Bilirubin: 1.3 mg/dL — ABNORMAL HIGH (ref 0.3–1.2)
Total Protein: 5.9 g/dL — ABNORMAL LOW (ref 6.5–8.1)

## 2019-01-28 LAB — SARS CORONAVIRUS 2 BY RT PCR (HOSPITAL ORDER, PERFORMED IN ~~LOC~~ HOSPITAL LAB): SARS Coronavirus 2: NEGATIVE

## 2019-01-28 LAB — CBG MONITORING, ED: Glucose-Capillary: 131 mg/dL — ABNORMAL HIGH (ref 70–99)

## 2019-01-28 LAB — LACTIC ACID, PLASMA
Lactic Acid, Venous: 2.1 mmol/L (ref 0.5–1.9)
Lactic Acid, Venous: 3.6 mmol/L (ref 0.5–1.9)

## 2019-01-28 LAB — GLUCOSE, CAPILLARY: Glucose-Capillary: 126 mg/dL — ABNORMAL HIGH (ref 70–99)

## 2019-01-28 LAB — PREPARE RBC (CROSSMATCH)

## 2019-01-28 MED ORDER — SUCRALFATE 1 GM/10ML PO SUSP: 1.0000 g | Freq: Three times a day (TID) | ORAL | Status: DC

## 2019-01-28 MED ORDER — SODIUM CHLORIDE 0.9 % IV SOLN
2.0000 g | INTRAVENOUS | Status: AC
  Administered 2019-01-28: 17:00:00 2 g via INTRAVENOUS
  Filled 2019-01-28: qty 2

## 2019-01-28 MED ORDER — SODIUM CHLORIDE 0.9 % IV SOLN
500.0000 mg | INTRAVENOUS | Status: DC
  Administered 2019-01-28 – 2019-02-02 (×6): 500 mg via INTRAVENOUS
  Filled 2019-01-28 (×6): qty 500

## 2019-01-28 MED ORDER — SODIUM CHLORIDE 0.9 % IV SOLN
2.0000 g | INTRAVENOUS | Status: DC
  Administered 2019-01-29 – 2019-01-30 (×2): 2 g via INTRAVENOUS
  Filled 2019-01-28 (×2): qty 2

## 2019-01-28 MED ORDER — VANCOMYCIN HCL 10 G IV SOLR
1500.0000 mg | INTRAVENOUS | Status: AC
  Administered 2019-01-28: 1500 mg via INTRAVENOUS
  Filled 2019-01-28: qty 1500

## 2019-01-28 MED ORDER — SODIUM CHLORIDE 0.9 % IV BOLUS
1000.0000 mL | Freq: Once | INTRAVENOUS | Status: AC
  Administered 2019-01-28: 14:00:00 1000 mL via INTRAVENOUS

## 2019-01-28 MED ORDER — HEPARIN SOD (PORK) LOCK FLUSH 100 UNIT/ML IV SOLN
500.0000 [IU] | Freq: Once | INTRAVENOUS | Status: DC | PRN
  Filled 2019-01-28: qty 5

## 2019-01-28 MED ORDER — SODIUM CHLORIDE 0.9 % IV SOLN: 10.0000 mL/h | Freq: Once | INTRAVENOUS | Status: DC

## 2019-01-28 MED ORDER — SODIUM BICARBONATE 8.4 % IV SOLN
50.0000 meq | Freq: Once | INTRAVENOUS | Status: AC
  Administered 2019-01-28: 50 meq via INTRAVENOUS
  Filled 2019-01-28: qty 50

## 2019-01-28 MED ORDER — DOCUSATE SODIUM 100 MG PO CAPS
100.0000 mg | ORAL_CAPSULE | Freq: Two times a day (BID) | ORAL | Status: DC
  Administered 2019-01-28 – 2019-02-04 (×14): 100 mg via ORAL
  Filled 2019-01-28 (×14): qty 1

## 2019-01-28 MED ORDER — LACTATED RINGERS IV SOLN
INTRAVENOUS | Status: DC
  Administered 2019-01-28 – 2019-01-30 (×2): via INTRAVENOUS

## 2019-01-28 MED ORDER — SODIUM CHLORIDE 0.9 % IV BOLUS
1000.0000 mL | Freq: Once | INTRAVENOUS | Status: AC
  Administered 2019-01-28: 1000 mL via INTRAVENOUS

## 2019-01-28 MED ORDER — ONDANSETRON HCL 4 MG/2ML IJ SOLN: 4.0000 mg | Freq: Four times a day (QID) | INTRAMUSCULAR | Status: DC | PRN

## 2019-01-28 MED ORDER — POLYETHYLENE GLYCOL 3350 17 G PO PACK: 17.0000 g | PACK | Freq: Every day | ORAL | Status: DC | PRN

## 2019-01-28 MED ORDER — SODIUM CHLORIDE 0.9 % IV BOLUS: 1000.0000 mL | Freq: Once | INTRAVENOUS | Status: DC

## 2019-01-28 MED ORDER — SODIUM CHLORIDE 0.9 % IV SOLN
Freq: Once | INTRAVENOUS | Status: DC
  Filled 2019-01-28: qty 250

## 2019-01-28 MED ORDER — VANCOMYCIN HCL IN DEXTROSE 1-5 GM/200ML-% IV SOLN: 1000.0000 mg | INTRAVENOUS | Status: DC

## 2019-01-28 MED ORDER — SODIUM CHLORIDE 0.9% FLUSH
10.0000 mL | INTRAVENOUS | Status: DC | PRN
  Filled 2019-01-28: qty 10

## 2019-01-28 MED ORDER — INSULIN ASPART 100 UNIT/ML ~~LOC~~ SOLN
3.0000 [IU] | Freq: Three times a day (TID) | SUBCUTANEOUS | Status: DC
  Administered 2019-01-29 – 2019-02-02 (×6): 3 [IU] via SUBCUTANEOUS
  Filled 2019-01-28: qty 0.03

## 2019-01-28 MED ORDER — ONDANSETRON HCL 4 MG PO TABS: 4.0000 mg | ORAL_TABLET | Freq: Four times a day (QID) | ORAL | Status: DC | PRN

## 2019-01-28 MED ORDER — SODIUM CHLORIDE 0.9 % IV SOLN
Freq: Once | INTRAVENOUS | Status: AC
  Administered 2019-01-28: 11:00:00 via INTRAVENOUS
  Filled 2019-01-28: qty 250

## 2019-01-28 MED ORDER — MIRTAZAPINE 15 MG PO TABS
7.5000 mg | ORAL_TABLET | Freq: Every day | ORAL | Status: DC
  Administered 2019-01-28 – 2019-02-04 (×8): 7.5 mg via ORAL
  Filled 2019-01-28 (×9): qty 1

## 2019-01-28 MED ORDER — INSULIN ASPART 100 UNIT/ML ~~LOC~~ SOLN
0.0000 [IU] | Freq: Every day | SUBCUTANEOUS | Status: DC
  Administered 2019-01-28: 22:00:00 1 [IU] via SUBCUTANEOUS
  Filled 2019-01-28: qty 0.05

## 2019-01-28 MED ORDER — PANTOPRAZOLE SODIUM 40 MG PO TBEC
40.0000 mg | DELAYED_RELEASE_TABLET | Freq: Two times a day (BID) | ORAL | Status: DC
  Administered 2019-01-28 – 2019-02-04 (×14): 40 mg via ORAL
  Filled 2019-01-28 (×14): qty 1

## 2019-01-28 MED ORDER — ACETAMINOPHEN 325 MG PO TABS
650.0000 mg | ORAL_TABLET | Freq: Four times a day (QID) | ORAL | Status: DC | PRN
  Administered 2019-02-01 – 2019-02-02 (×2): 650 mg via ORAL
  Filled 2019-01-28 (×2): qty 2

## 2019-01-28 MED ORDER — ACETAMINOPHEN 650 MG RE SUPP: 650.0000 mg | Freq: Four times a day (QID) | RECTAL | Status: DC | PRN

## 2019-01-28 MED ORDER — CHLORHEXIDINE GLUCONATE CLOTH 2 % EX PADS
6.0000 | MEDICATED_PAD | Freq: Every day | CUTANEOUS | Status: DC
  Administered 2019-01-28 – 2019-02-06 (×8): 6 via TOPICAL

## 2019-01-28 MED ORDER — HYDROCOD POLST-CPM POLST ER 10-8 MG/5ML PO SUER
5.0000 mL | Freq: Two times a day (BID) | ORAL | Status: DC | PRN
  Administered 2019-01-29 – 2019-01-31 (×3): 5 mL via ORAL
  Filled 2019-01-28 (×4): qty 5

## 2019-01-28 MED ORDER — CHLORPROMAZINE HCL 25 MG PO TABS
25.0000 mg | ORAL_TABLET | Freq: Three times a day (TID) | ORAL | Status: DC
  Administered 2019-01-28 – 2019-02-03 (×19): 25 mg via ORAL
  Filled 2019-01-28 (×21): qty 1

## 2019-01-28 MED ORDER — LEVOTHYROXINE SODIUM 100 MCG PO TABS
100.0000 ug | ORAL_TABLET | Freq: Every day | ORAL | Status: DC
  Administered 2019-01-29 – 2019-02-05 (×8): 100 ug via ORAL
  Filled 2019-01-28 (×7): qty 1

## 2019-01-28 MED ORDER — INSULIN ASPART 100 UNIT/ML ~~LOC~~ SOLN
0.0000 [IU] | Freq: Three times a day (TID) | SUBCUTANEOUS | Status: DC
  Administered 2019-01-28: 1 [IU] via SUBCUTANEOUS
  Administered 2019-01-29 – 2019-01-30 (×2): 2 [IU] via SUBCUTANEOUS
  Administered 2019-01-30 – 2019-02-04 (×7): 1 [IU] via SUBCUTANEOUS
  Administered 2019-02-04: 2 [IU] via SUBCUTANEOUS
  Administered 2019-02-05: 09:00:00 3 [IU] via SUBCUTANEOUS
  Filled 2019-01-28: qty 0.09

## 2019-01-28 MED ORDER — SODIUM CHLORIDE 0.9 % IV BOLUS
1000.0000 mL | INTRAVENOUS | Status: DC
  Administered 2019-01-28: 1000 mL via INTRAVENOUS

## 2019-01-28 NOTE — ED Notes (Signed)
Date and time results received: 01/18/2019 2:13 PM  (use smartphrase ".now" to insert current time)  Test: WBC 0.3 Critical Value: Platelets 12  Name of Provider Notified: Darl Householder  Orders Received? Or Actions Taken?: Actions Taken: Chiropodist and EDP

## 2019-01-28 NOTE — Progress Notes (Signed)
Pharmacy Antibiotic Note  Zachary Knox is a 68 y.o. male admitted on 01/20/2019 with pneumonia.  Pharmacy has been consulted for vancomycin and cefepime dosing.  Plan: Cefepime 2 gm IV q24  Vancomycin 1500 mg loading dose given at 1519 Vancomycin 1000 mg IV Q 48 hrs. Goal AUC 400-550. Expected AUC: 489.2  SCr used: 3.66 TBW Azithromycin 500 mg IV q24 per MD F/u renal fxn, WBC, temp, culture data Vancomycin levels as needed    Temp (24hrs), Avg:97.7 F (36.5 C), Min:97.5 F (36.4 C), Max:98.2 F (36.8 C)  Recent Labs  Lab 01/25/19 1142 01/24/2019 1140 01/06/2019 1305 01/23/2019 1527  WBC 5.1 0.4* 0.3*  --   CREATININE 1.91* 3.50* 3.66*  --   LATICACIDVEN  --   --  2.1* 3.6*    Estimated Creatinine Clearance: 18.9 mL/min (A) (by C-G formula based on SCr of 3.66 mg/dL (H)).    No Known Allergies  Antimicrobials this admission: 9/25 cefepime> 9/25 vanc> 9/25 azith>> Dose adjustments this admission:  Microbiology results: 9/25 UCx: sent 9/25 BCx1: sent 9/25 MRSA PCR: ordered  Thank you for allowing pharmacy to be a part of this patient's care.  Eudelia Bunch, Pharm.D [phone] 01/04/2019 7:37 PM

## 2019-01-28 NOTE — Telephone Encounter (Signed)
With inquiry of orders, confirmed with provider patient currently admitted.

## 2019-01-28 NOTE — ED Provider Notes (Addendum)
[facility] DEPT Provider Note   CSN: [id number] Arrival date & time: 01/21/2019  1300     History   Chief Complaint Chief Complaint  Patient presents with  . Hypotension    HPI Zachary Knox is a 67 y.o. male hx of DM, HTN, who presented with hypotension.  Patient has a history of metastatic lung cancer and had chemotherapy about a week ago.  Patient has not been eating and drinking much and feels weak and tired.  He went to oncology clinic today for IV fluids and was noted to be hypotensive in the 70s.  Patient denies any fevers or chills or cough.  Patient denies any urinary symptoms and in particular states that he is urinating well.  His labs at oncology clinic showed acute renal failure as well as neutropenia.     The history is provided by the patient.    Past Medical History:  Diagnosis Date  . Cancer ([place]) 2020   lung  . Diabetes mellitus without complication ([place])   . Dyspnea    increased exertion  . Hypertension     Patient Active Problem List   Diagnosis Date Noted  . Sepsis due to pneumonia ([place]) 01/25/2019  . Uncomplicated sepsis ([place]) 12/09/2018  . Acute metabolic encephalopathy   . Primary malignant neoplasm of lung metastatic to other site [facility])   . Insulin dependent diabetes mellitus ([place])   . Thrombocytopenia ([place]) 11/29/2018  . Hypokalemia 11/29/2018  . Hyponatremia 11/29/2018  . Port-A-Cath in place 11/17/2018  . Encounter for antineoplastic immunotherapy 10/20/2018  . Adenocarcinoma of right lung, stage 4 ([place]) 08/19/2018  . Encounter for antineoplastic chemotherapy 08/19/2018  . Goals of care, counseling/discussion 08/19/2018  . Osseous metastasis ([place]) 07/26/2018  . 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 Tobacco abuse disorder 07/12/2018  . Alcohol abuse 07/12/2018    Past Surgical History:  Procedure Laterality Date  . CARDIAC SURGERY     Heart attack in 2008 with stent placement  . IR IMAGING GUIDED PORT  INSERTION  11/01/2018  . stent in heart          Home Medications    Prior to Admission medications   Medication Sig Start Date End Date Taking? Authorizing Provider  acetaminophen (TYLENOL) 500 MG tablet Take 1,000 mg by mouth every 6 (six) hours as needed for mild pain.     [provider]  albuterol (VENTOLIN) 2 MG/5ML syrup TK 5 ML PO Q 4 H COU OR CHEST TIGHTNESS 12/15/18   [provider]  amLODipine (NORVASC) 5 MG tablet Take 5 mg by mouth daily. 05/30/18   [provider]  amoxicillin-clavulanate (AUGMENTIN) 875-125 MG tablet Take 1 tablet by mouth every 12 (twelve) hours. 12/13/18   Mercy Riding, MD  aspirin EC 81 MG tablet Take 81 mg by mouth daily.     [provider]  atorvastatin (LIPITOR) 20 MG tablet Take 20 mg by mouth daily.    [provider]  azithromycin (ZITHROMAX) 250 MG tablet Take two tablets (500 mg) tongiht 12/13/18   Mercy Riding, MD  chlorpheniramine-HYDROcodone (TUSSIONEX) 10-8 MG/5ML SUER Take 5 mLs by mouth every 12 (twelve) hours as needed for cough. 01/12/19   Curt Bears, MD  chlorproMAZINE (THORAZINE) 25 MG tablet Take 1 tablet (25 mg total) by mouth 3 (three) times daily. 01/27/19   Curt Bears, MD  folic acid (FOLVITE) 1 MG tablet Take 1 tablet (1 mg total) by mouth daily. 10/20/18  Curt Bears, MD  gabapentin (NEURONTIN) 400 MG capsule Take 400 mg by mouth at bedtime.  08/17/18   [provider]  levothyroxine (SYNTHROID, LEVOTHROID) 100 MCG tablet Take 100 mcg by mouth daily. 06/26/18   [provider]  lidocaine (XYLOCAINE) 2 % solution Use as directed 15 mLs in the mouth or throat every 6 (six) hours as needed (mouth/throat pain). Swish and swallow Patient not taking: Reported on 12/09/2018 09/22/18   Gery Pray, MD  metFORMIN (GLUCOPHAGE) 1000 MG tablet Take 500-1,000 mg by mouth See admin instructions. Take 1000 mg every morning and 500 mg at lunch and supper    [provider]  metoprolol succinate (TOPROL-XL) 100 MG 24 hr tablet Take 100 mg by mouth daily. Take with or immediately following a meal.    [provider]  mirtazapine (REMERON) 7.5 MG tablet TK 1 T PO HS 12/16/18   [provider]  pantoprazole (PROTONIX) 40 MG tablet Take 40 mg by mouth 2 (two) times daily. 05/17/18   [provider]  potassium chloride SA (K-DUR) 20 MEQ tablet Take 1 tablet (20 mEq total) by mouth 2 (two) times daily. 12/22/18   Curt Bears, MD  prochlorperazine (COMPAZINE) 10 MG tablet Take 1 tablet (10 mg total) by mouth every 6 (six) hours as needed for nausea or vomiting. Patient not taking: Reported on 08/30/2018 08/20/18   Curt Bears, MD  ramipril (ALTACE) 10 MG capsule Take 10 mg by mouth daily. 04/21/18   [provider]  sucralfate (CARAFATE) 1 GM/10ML suspension Take 10 mLs (1 g total) by mouth 3 (three) times daily as needed. Patient taking differently: Take 1 g by mouth 3 (three) times daily.  11/27/18   Quintella Reichert, MD  TOUJEO SOLOSTAR 300 UNIT/ML SOPN Inject 18 Units into the skin every morning. 06/16/18   [provider]    Family History No family history on file.  Social History Social History   Tobacco Use  . Smoking status: Former Smoker    Types: Cigarettes  . Smokeless tobacco: Never Used  Substance Use Topics  . Alcohol use: Not Currently    Comment: occassional   . Drug use: Never     Allergies   Patient has no known allergies.   Review of Systems Review of Systems  Neurological: Positive for weakness.  All other systems reviewed and are negative.    Physical Exam Updated Vital Signs BP (!) 124/111   Pulse (!) 101   Temp (!) 97.5 F (36.4 C) (Oral)   Resp 20   SpO2 98%   Physical Exam Vitals signs and nursing note reviewed.  Constitutional:      Appearance: Normal appearance.     Comments: Chronically ill   HENT:     Head: Normocephalic.     Nose: Nose normal.      Mouth/Throat:     Mouth: Mucous membranes are dry.     Comments: MM dry  Eyes:     Extraocular Movements: Extraocular movements intact.     Pupils: Pupils are equal, round, and reactive to light.  Neck:     Musculoskeletal: Normal range of motion and neck supple.  Cardiovascular:     Rate and Rhythm: Normal rate and regular rhythm.     Pulses: Normal pulses.     Heart sounds: Normal heart sounds.  Pulmonary:     Effort: Pulmonary effort is normal.     Breath sounds: Normal breath sounds.  Abdominal:  General: Abdomen is flat.     Palpations: Abdomen is soft.     Comments: No CVAT   Musculoskeletal: Normal range of motion.  Skin:    General: Skin is warm.     Capillary Refill: Capillary refill takes less than 2 seconds.  Neurological:     General: No focal deficit present.     Mental Status: He is alert and oriented to person, place, and time.  Psychiatric:        Mood and Affect: Mood normal.        Behavior: Behavior normal.      ED Treatments / Results  Labs (all labs ordered are listed, but only abnormal results are displayed) Labs Reviewed  CBC WITH DIFFERENTIAL/PLATELET - Abnormal; Notable for the following components:      Result Value   WBC 0.3 (*)    RBC 2.23 (*)    Hemoglobin 7.4 (*)    HCT 22.9 (*)    MCV 102.7 (*)    RDW 15.9 (*)    Platelets 12 (*)    Neutro Abs 0.2 (*)    Lymphs Abs 0.1 (*)    Monocytes Absolute 0.0 (*)    All other components within normal limits  BASIC METABOLIC PANEL - Abnormal; Notable for the following components:   Sodium 134 (*)    Potassium 5.3 (*)    CO2 15 (*)    Glucose, Bld 170 (*)    BUN 54 (*)    Creatinine, Ser 3.66 (*)    Calcium 8.1 (*)    GFR calc non Af Amer 16 (*)    GFR calc Af Amer 19 (*)    All other components within normal limits  LACTIC ACID, PLASMA - Abnormal; Notable for the following components:   Lactic Acid, Venous 2.1 (*)    All other components within normal limits  SARS CORONAVIRUS 2  (HOSPITAL ORDER, [facility])  CULTURE, BLOOD (ROUTINE X 2)  CULTURE, BLOOD (ROUTINE X 2)  URINE CULTURE  LACTIC ACID, PLASMA  URINALYSIS, ROUTINE W REFLEX MICROSCOPIC  PREPARE RBC (CROSSMATCH)    EKG EKG Interpretation  Date/Time:  Friday January 28 2019 13:12:49 EDT Ventricular Rate:  98 PR Interval:    QRS Duration: 94 QT Interval:  365 QTC Calculation: 466 R Axis:   -53 Text Interpretation:  Sinus rhythm Left anterior fascicular block No significant change since last tracing Confirmed by Wandra Arthurs [id number]) on 01/12/2019 4:08:32 PM   Radiology US Renal  Result Date: 01/04/2019 CLINICAL DATA:  Renal failure EXAM: RENAL / URINARY TRACT ULTRASOUND COMPLETE COMPARISON:  CT scan June 08, 2017 FINDINGS: Right Kidney: Renal measurements: 11.4 x 5.6 x 5.2 cm = volume: 173.7 mL . Echogenicity within normal limits. No mass or hydronephrosis visualized. Left Kidney: Renal measurements: 11.6 x 6.2 x 5.7 cm = volume: 213 mL. Contains a 1.7 cm cyst seen on previous CT imaging. Bladder: Appears normal for degree of bladder distention. IMPRESSION: 1. No cause for the patient's renal failure identified. A single left-sided renal cyst is noted, better assessed on previous CT imaging. Electronically Signed   By: Dorise Bullion III M.D   On: 01/04/2019 14:32   Dg Chest Port 1 View  Result Date: 01/16/2019 CLINICAL DATA:  Hypertension, lung cancer, diabetes mellitus, hypertension EXAM: PORTABLE CHEST 1 VIEW COMPARISON:  Portable exam 1343 hours compared to 12/09/2018 FINDINGS: Volume loss in the RIGHT hemithorax 6 with mild mediastinal shift from LEFT to RIGHT.  RIGHT jugular Port-A-Cath with tip projecting over RIGHT atrium. Normal heart size and pulmonary vascularity. Mild patchy RIGHT perihilar and RIGHT upper lobe infiltrates. LEFT lung clear. Elevation of RIGHT diaphragm. No pleural effusion or pneumothorax. IMPRESSION: Patchy RIGHT perihilar and RIGHT upper lobe  infiltrates. LEFT lung clear. Electronically Signed   By: Lavonia Dana M.D.   On: 01/21/2019 14:20    Procedures Procedures (including critical care time)  CRITICAL CARE Performed by: Wandra Arthurs   Total critical care time: 30  minutes  Critical care time was exclusive of separately billable procedures and treating other patients.  Critical care was necessary to treat or prevent imminent or life-threatening deterioration.  Critical care was time spent personally by me on the following activities: development of treatment plan with patient and/or surrogate as well as nursing, discussions with consultants, evaluation of patient's response to treatment, examination of patient, obtaining history from patient or surrogate, ordering and performing treatments and interventions, ordering and review of laboratory studies, ordering and review of radiographic studies, pulse oximetry and re-evaluation of patient's condition.   Medications Ordered in ED Medications  0.9 %  sodium chloride infusion (0 mL/hr Intravenous Hold 01/25/2019 1519)  ceFEPIme (MAXIPIME) 2 g in sodium chloride 0.9 % 100 mL IVPB (has no administration in time range)  vancomycin (VANCOCIN) 1,500 mg in sodium chloride 0.9 % 500 mL IVPB (1,500 mg Intravenous New Bag/Given 01/16/2019 1519)  sodium chloride 0.9 % bolus 1,000 mL (0 mLs Intravenous Stopped 01/13/2019 1419)  sodium bicarbonate injection 50 mEq (50 mEq Intravenous Given 01/19/2019 1401)  sodium chloride 0.9 % bolus 1,000 mL (0 mLs Intravenous Stopped 01/21/2019 1517)     Initial Impression / Assessment and Plan / ED Course  I have reviewed the triage vital signs and the nursing notes.  Pertinent labs & imaging results that were available during my care of the patient were reviewed by me and considered in my medical decision making (see chart for details).       Zachary Knox is a 68 y.o. male here with weakness, hypotension, acute renal failure.  Hypotension likely secondary  to acute renal failure from volume depletion.  Patient was noted to be neutropenic but has no fever and did have chemotherapy about 2 days ago.  Will repeat chemistry and CBC and obtain lactate and cultures.  Will hold off on antibiotics for now and give IV fluids and get renal US. Will need admission for IVF.     4:08 PM Labs showed WBC 0.3. ANC of 200. Hg is 7.4, Platelet 12. Also has AKI but Renal US showed no obvious hydro. CXR however showed infiltrate which may be the underlying mass. Talked to Dr. Maylon Peppers from oncology. He recommend transfusion 1 U PRBC since he was hypotensive. If platelets less than 10, will need transfusion of platelets. I held abx initially but since his lactate is 2.1 and has possible pneumonia, will give vanc/cefepime for HCAP. Hospitalist to admit for severe sepsis, hypotension, neutropenia, AKI.    Final Clinical Impressions(s) / ED Diagnoses   Final diagnoses:  AKI (acute kidney injury) ([place])  Severe sepsis ([place])  HCAP (healthcare-associated pneumonia)    ED Discharge Orders    None       Drenda Freeze, MD 01/06/2019 1453    Drenda Freeze, MD 01/12/2019 [phone]

## 2019-01-28 NOTE — H&P (Signed)
[facility] History and Physical   Patient: Zachary Knox XTK:[id number]   PCP: Vincente Liberty, MD DOB: 07-19-1950   DOA: 01/31/2019   DOS: 01/30/2019   DOS: the patient was seen and examined on 01/07/2019  Patient coming from: The patient is coming from Home  Chief Complaint: fatigue  HPI: Zachary Knox is a 68 y.o. 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 Last chemo: 01/19/2019 keytruda alimta carboplatin Patient presented with complaints of generalized fatigue poor p.o. intake.  No nausea no vomiting.  He also has some cough and shortness of breath.  He has been taking Tussionex without any benefit.  No diarrhea no constipation. With this the patient presented to cancer center with IV fluid appointment.  His blood pressure was significantly low.  Patient was transferred to emergency department further work-up. At the time of my evaluation denies any complaints of headache, nausea, vomiting.  No shortness of breath no chest pain abdominal pain.  Has some cough.  ED Course: Found to be hypotensive.  Had acute kidney injury.  Ultrasound renal was performed showed no hydronephrosis.  Found to have absolute neutropenia and anemia as well as thrombocytopenia.  EDP discussed with oncology recommend 1 PRBC transfusion.  Patient was admitted for pneumonia and sepsis.  At his baseline walk with support Is independent for most of his ADL;  Does manages his medication on his own.  Review of Systems: as mentioned in the history of present illness.  All other systems reviewed and are negative.  Past Medical History:  Diagnosis Date  . Cancer ([place]) 2020   lung  . Diabetes mellitus without complication ([place])   . Dyspnea    increased exertion  . Hypertension    Past Surgical History:  Procedure Laterality  Date  . CARDIAC SURGERY     Heart attack in 2008 with stent placement  . IR IMAGING GUIDED PORT INSERTION  11/01/2018  . stent in heart     Social History:  reports that he has quit smoking. His smoking use included cigarettes. He has never used smokeless tobacco. He reports previous alcohol use. He reports that he does not use drugs.  No Known Allergies  Family history reviewed and not pertinent   Prior to Admission medications   Medication Sig Start Date End Date Taking? Authorizing Provider  acetaminophen (TYLENOL) 500 MG tablet Take 1,000 mg by mouth every 6 (six) hours as needed for mild pain.     [provider]  albuterol (VENTOLIN) 2 MG/5ML syrup Take 2 mg by mouth 4 (four) times daily.  12/15/18   [provider]  amLODipine (NORVASC) 5 MG tablet Take 5 mg by mouth daily. 05/30/18   [provider]  amoxicillin-clavulanate (AUGMENTIN) 875-125 MG tablet Take 1 tablet by mouth every 12 (twelve) hours. Patient not taking: Reported on 01/06/2019 12/13/18   Mercy Riding, MD  aspirin EC 81 MG tablet Take 81 mg by mouth daily.     [provider]  atorvastatin (LIPITOR) 20 MG tablet Take 20 mg by mouth daily.    [provider]  azithromycin (ZITHROMAX) 250 MG tablet Take two tablets (500 mg) tongiht Patient not taking: Reported on 01/08/2019 12/13/18   Mercy Riding, MD  chlorpheniramine-HYDROcodone (TUSSIONEX) 10-8 MG/5ML SUER Take 5 mLs by mouth every 12 (twelve) hours as needed for cough.  01/12/19   Curt Bears, MD  chlorproMAZINE (THORAZINE) 25 MG tablet Take 1 tablet (25 mg total) by mouth 3 (three) times daily. 01/27/19   Curt Bears, MD  folic acid (FOLVITE) 1 MG tablet Take 1 tablet (1 mg total) by mouth daily. 10/20/18   Curt Bears, MD  gabapentin (NEURONTIN) 400 MG capsule Take 400 mg by mouth at bedtime.  08/17/18   [provider]  levothyroxine (SYNTHROID, LEVOTHROID) 100 MCG tablet Take 100 mcg by mouth daily.  06/26/18   [provider]  lidocaine (XYLOCAINE) 2 % solution Use as directed 15 mLs in the mouth or throat every 6 (six) hours as needed (mouth/throat pain). Swish and swallow Patient not taking: Reported on 12/09/2018 09/22/18   Gery Pray, MD  metFORMIN (GLUCOPHAGE) 1000 MG tablet Take 500-1,000 mg by mouth See admin instructions. Take 1000 mg every morning and 500 mg at lunch and supper    [provider]  metoprolol succinate (TOPROL-XL) 100 MG 24 hr tablet Take 100 mg by mouth daily. Take with or immediately following a meal.    [provider]  mirtazapine (REMERON) 7.5 MG tablet Take 7.5 mg by mouth at bedtime.  12/16/18   [provider]  pantoprazole (PROTONIX) 40 MG tablet Take 40 mg by mouth 2 (two) times daily. 05/17/18   [provider]  potassium chloride SA (K-DUR) 20 MEQ tablet Take 1 tablet (20 mEq total) by mouth 2 (two) times daily. 12/22/18   Curt Bears, MD  prochlorperazine (COMPAZINE) 10 MG tablet Take 1 tablet (10 mg total) by mouth every 6 (six) hours as needed for nausea or vomiting. Patient not taking: Reported on 08/30/2018 08/20/18   Curt Bears, MD  ramipril (ALTACE) 10 MG capsule Take 10 mg by mouth daily. 04/21/18   [provider]  sucralfate (CARAFATE) 1 GM/10ML suspension Take 10 mLs (1 g total) by mouth 3 (three) times daily as needed. Patient taking differently: Take 1 g by mouth 3 (three) times daily.  11/27/18   Quintella Reichert, MD  TOUJEO SOLOSTAR 300 UNIT/ML SOPN Inject 18 Units into the skin every morning. 06/16/18   [provider]    Physical Exam: Vitals:   01/07/2019 1800 01/14/2019 1830 01/30/2019 1900 02/02/2019 1906  BP: 107/72 101/69 103/70 103/70  Pulse:  (!) 105  (!) 108  Resp: 17 (!) '31 18 18  ' Temp:      TempSrc:      SpO2:  97%  100%    General: alert and oriented to time, place, and person. Appear in moderate distress, affect appropriate Eyes: PERRL, Conjunctiva normal ENT:  Oral Mucosa Clear, moist  Neck: no JVD, no Abnormal Mass Or lumps Cardiovascular: S1 and S2 Present, no Murmur, peripheral pulses symmetrical Respiratory: good respiratory effort, Bilateral Air entry equal and Decreased, no signs of accessory muscle use, bilateral  Crackles, no wheezes Abdomen: Bowel Sound present, Soft and no tenderness, no hernia Skin: no rashes  Extremities: no Pedal edema, no calf tenderness Neurologic: without any new focal findings Gait not checked due to patient safety concerns  Data Reviewed: I have personally reviewed and interpreted labs, imaging as discussed below.  CBC: Recent Labs  Lab 01/25/19 1142 01/18/2019 1140 01/27/2019 1305  WBC 5.1 0.4* 0.3*  NEUTROABS 4.4 0.2* 0.2*  HGB 9.7* 7.7* 7.4*  HCT 29.1* 24.3* 22.9*  MCV 97.7 101.3* 102.7*  PLT 61* 14* 12*   Basic Metabolic Panel: Recent Labs  Lab 01/25/19 1142 01/29/2019 1140 01/21/2019 1305  NA 137 135 134*  K 5.1 5.4* 5.3*  CL 105 105 107  CO2 19* 15* 15*  GLUCOSE 184* 172* 170*  BUN 24* 50* 54*  CREATININE 1.91* 3.50* 3.66*  CALCIUM 9.5 8.7* 8.1*   GFR: Estimated Creatinine Clearance: 18.9 mL/min (A) (by C-G formula based on SCr of 3.66 mg/dL (H)). Liver Function Tests: Recent Labs  Lab 01/25/19 1142 01/20/2019 1140 01/08/2019 1305  AST '21 18 20  ' ALT '12 9 15  ' ALKPHOS 89 73 58  BILITOT 1.5* 1.0 1.3*  PROT 7.3 6.5 5.9*  ALBUMIN 2.8* 2.3* 2.4*   No results for input(s): LIPASE, AMYLASE in the last 168 hours. No results for input(s): AMMONIA in the last 168 hours. Coagulation Profile: No results for input(s): INR, PROTIME in the last 168 hours. Cardiac Enzymes: No results for input(s): CKTOTAL, CKMB, CKMBINDEX, TROPONINI in the last 168 hours. BNP (last 3 results) No results for input(s): PROBNP in the last 8760 hours. HbA1C: No results for input(s): HGBA1C in the last 72 hours. CBG: Recent Labs  Lab 02/01/2019 1807  GLUCAP 131*   Lipid Profile: No results for input(s): CHOL,  HDL, LDLCALC, TRIG, CHOLHDL, LDLDIRECT in the last 72 hours. Thyroid Function Tests: No results for input(s): TSH, T4TOTAL, FREET4, T3FREE, THYROIDAB in the last 72 hours. Anemia Panel: No results for input(s): VITAMINB12, FOLATE, FERRITIN, TIBC, IRON, RETICCTPCT in the last 72 hours. Urine analysis:    Component Value Date/Time   COLORURINE YELLOW 01/19/2019 1800   APPEARANCEUR HAZY (A) 01/07/2019 1800   LABSPEC 1.010 01/13/2019 1800   PHURINE 5.0 01/07/2019 1800   GLUCOSEU NEGATIVE 01/27/2019 1800   HGBUR NEGATIVE 02/01/2019 1800   BILIRUBINUR NEGATIVE 01/29/2019 1800   KETONESUR NEGATIVE 01/07/2019 1800   PROTEINUR NEGATIVE 01/19/2019 1800   UROBILINOGEN 1.0 12/16/2008 0451   NITRITE NEGATIVE 01/10/2019 1800   LEUKOCYTESUR NEGATIVE 01/04/2019 1800    Radiological Exams on Admission: US Renal  Result Date: 02/01/2019 CLINICAL DATA:  Renal failure EXAM: RENAL / URINARY TRACT ULTRASOUND COMPLETE COMPARISON:  CT scan June 08, 2017 FINDINGS: Right Kidney: Renal measurements: 11.4 x 5.6 x 5.2 cm = volume: 173.7 mL . Echogenicity within normal limits. No mass or hydronephrosis visualized. Left Kidney: Renal measurements: 11.6 x 6.2 x 5.7 cm = volume: 213 mL. Contains a 1.7 cm cyst seen on previous CT imaging. Bladder: Appears normal for degree of bladder distention. IMPRESSION: 1. No cause for the patient's renal failure identified. A single left-sided renal cyst is noted, better assessed on previous CT imaging. Electronically Signed   By: Dorise Bullion III M.D   On: 01/22/2019 14:32   Dg Chest Port 1 View  Result Date: 01/27/2019 CLINICAL DATA:  Hypertension, lung cancer, diabetes mellitus, hypertension EXAM: PORTABLE CHEST 1 VIEW COMPARISON:  Portable exam 1343 hours compared to 12/09/2018 FINDINGS: Volume loss in the RIGHT hemithorax 6 with mild mediastinal shift from LEFT to RIGHT. RIGHT jugular Port-A-Cath with tip projecting over RIGHT atrium. Normal heart size and pulmonary  vascularity. Mild patchy RIGHT perihilar and RIGHT upper lobe infiltrates. LEFT lung clear. Elevation of RIGHT diaphragm. No pleural effusion or pneumothorax. IMPRESSION: Patchy RIGHT perihilar and RIGHT upper lobe infiltrates. LEFT lung clear. Electronically Signed   By: Lavonia Dana M.D.   On: 01/14/2019 14:20   I reviewed all nursing notes, pharmacy notes, vitals, pertinent old records.  Assessment/Plan 1. Sepsis due to pneumonia ([place]) Hypotensive, tachycardic, has pancytopenia and chest x-ray showing increased infiltrate on the right side. Given that the  patient is on chemotherapy he will be treated with healthcare associated coverage. We will treat with IV vancomycin cefepime and Flagyl. Follow-up on cultures. 1 more bolus as well as 1 PRBC transfusion. Monitor on stepdown unit.  2.  Pancytopenia. 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 EDP discussed with oncology. Currently not recommending Granix but recommends PRBC transfusion. Neutropenic precautions. May require Granix tomorrow.  3.  Acute kidney injury on chronic kidney disease. Poor p.o. intake. Metabolic acidosis Hyperkalemia Prerenal etiology. Ultrasound renal negative for hydronephrosis. Baseline serum creatinine 1.7.  Presents with serum creatinine of 3.50.  Mild lactic acidosis as well. Continue to aggressively hydrate with IV fluids.  Monitor in stepdown unit.  4.  Thrombocytopenia ([facility]) Severe. Likely secondary to chemotherapy. Potentially can get worse in the setting of sepsis as well as hydration. Transfuse for platelets less than 10 or active bleeding. Monitor in stepdown for spontaneous bleeding.  5  Insulin dependent diabetes mellitus ([place]) Uncontrolled with hyperglycemia. Check hemoglobin A1c. Holding long-acting Toujeo. Placing on sliding scale insulin only.  6.  Pain control. Continue home regimen.  Nutrition: Regular diet DVT  Prophylaxis: SCD, pharmacological prophylaxis contraindicated due to thrombocytopenia  Advance goals of care discussion: Full code pt wanted to change DNR to full code  Consults: none EDP personally Discussed with Dr Maylon Peppers   Family Communication: no family was present at bedside, at the time of interview.  Disposition: Admitted as inpatient, step-down unit. Likely to be discharged home, in 3-4 days.  I have discussed plan of care as described above with RN and patient/family.  Severity of Illness: The appropriate patient status for this patient is INPATIENT. Inpatient status is judged to be reasonable and necessary in order to provide the required intensity of service to ensure the patient's safety. The patient's presenting symptoms, physical exam findings, and initial radiographic and laboratory data in the context of their chronic comorbidities is felt to place them at high risk for further clinical deterioration. Furthermore, it is not anticipated that the patient will be medically stable for discharge from the hospital within 2 midnights of admission. The following factors support the patient status of inpatient.   " The patient's presenting symptoms include hypotension. " The worrisome physical exam findings include right sided pneumonia. " The initial radiographic and laboratory data are worrisome because of right lobe pneumonia, pancytopenia with neutropenia. " The chronic co-morbidities include lunc cancer on active chemo .   * I certify that at the point of admission it is my clinical judgment that the patient will require inpatient hospital care spanning beyond 2 midnights from the point of admission due to high intensity of service, high risk for further deterioration and high frequency of surveillance required.*    Author: Berle Mull, MD [facility] 01/23/2019 7:30 PM   To reach On-call, see care teams to locate the attending and reach out to them via [email or web address]. If  7PM-7AM, please contact night-coverage If you still have difficulty reaching the attending provider, please page the [facility] (Director on Call) for [facility] on amion for assistance.

## 2019-01-28 NOTE — Patient Instructions (Signed)

## 2019-01-28 NOTE — Progress Notes (Signed)
A consult was received from an ED physician for Vancomycin and Cefepime per pharmacy dosing.  The patient's profile has been reviewed for ht/wt/allergies/indication/available labs.    A one time order has been placed for Vancomycin 1500mg  IV and Cefepime 2g IV.  Further antibiotics/pharmacy consults should be ordered by admitting physician if indicated.                       Thank you, Luiz Ochoa 01/20/2019  2:47 PM

## 2019-01-28 NOTE — ED Triage Notes (Signed)
Patient from cancer center infusion suite where he was to get fluids. BP 80/60, HR 110. Afebrile. A&Ox4. Hx lung cancer. K 5.4  Port accessed 767ml NS at PTA

## 2019-01-28 NOTE — Progress Notes (Signed)
Pt appears fatigued and lethargic when arriving at IVF appt.  Mild negative vocalization but denies pain/concerns other than "feeling weak and tired all the time, and I'm out of breath".  Pt answers all mentation questions well but loses track of conversations and was searching for the sweater he had on him.  Denies recent falls but reports dizziness & weakness.  Denies CP.  Afebrile.  Abnormal VS and symptoms/concerns reported to MD Maylon Peppers (on call), who saw pt in infusion suite.  Per MD Maylon Peppers pt advised to discontinue his BP medication tonight when he gets home and to recheck his BP daily before taking his medication, and that if his SBP is below 130 to hold his medicine.  Pt verbalized understanding.  RN requested labs to be drawn d/t pt's reported lack of urination since yesterday evening along with constipation over last few days.  Labs drawn (CBC/CMP/Type & Screen) and scheduling message sent/orders placed for pt to receive 1L IVF and 1 unit PRBCS tomorrow (01/29/2019) in infusion suite.  All abnormal labs reviewed with MD Maylon Peppers who recommended that the pt be transferred to the ED.  Pt's wife contacted by Cherre Huger RN and alerted to change in plan of care.  VS rechecked.  Blood bank alerted to change being admitted to ED.  Appts cancelled for infusion suite tomorrow (01/29/2019).  Pt transported via [place] to [place] room 17 with belongings.  Report given bedside to [employer].  Port still accessed, IVF stopped before leaving CC.

## 2019-01-28 NOTE — ED Notes (Signed)
ED Provider at bedside. 

## 2019-01-28 NOTE — ED Notes (Addendum)
Lactic 2.1 MD made aware

## 2019-01-28 NOTE — Telephone Encounter (Signed)
CRITICAL VALUE STICKER  CRITICAL VALUE: WBC = 0.4.                                ANC = 0.2.   RECEIVER (on-site recipient of call): Mining engineer, Triage [place].   DATE & TIME NOTIFIED: 01/05/2019 at 1154.   MESSENGER (representative from lab): Reva [facility].  MD NOTIFIED: Collaborative nurse.  TIME OF NOTIFICATION: 01/12/2019 at 1202.  RESPONSE: None.

## 2019-01-28 NOTE — Progress Notes (Signed)
Lab called with creatinine 3.5.  Dr Maylon Peppers notified, states patient needs to ED. Report called to the ED.   Wife notified- states "he has a raw area in his "crack"- has been using some ointment on it"  Reports that he has not had a BM in several days. Has only urinated X 1 yesterday.  Has been very disoriented. - ED notified

## 2019-01-28 NOTE — ED Notes (Signed)
US at bedside

## 2019-01-29 ENCOUNTER — Other Ambulatory Visit: Payer: Self-pay

## 2019-01-29 ENCOUNTER — Ambulatory Visit: Payer: Medicare Other

## 2019-01-29 DIAGNOSIS — L8990 Pressure ulcer of unspecified site, unspecified stage: Secondary | ICD-10-CM | POA: Insufficient documentation

## 2019-01-29 LAB — COMPREHENSIVE METABOLIC PANEL
ALT: 14 U/L (ref 0–44)
AST: 23 U/L (ref 15–41)
Albumin: 2.2 g/dL — ABNORMAL LOW (ref 3.5–5.0)
Alkaline Phosphatase: 51 U/L (ref 38–126)
Anion gap: 10 (ref 5–15)
BUN: 50 mg/dL — ABNORMAL HIGH (ref 8–23)
CO2: 15 mmol/L — ABNORMAL LOW (ref 22–32)
Calcium: 7.7 mg/dL — ABNORMAL LOW (ref 8.9–10.3)
Chloride: 112 mmol/L — ABNORMAL HIGH (ref 98–111)
Creatinine, Ser: 3.1 mg/dL — ABNORMAL HIGH (ref 0.61–1.24)
GFR calc Af Amer: 23 mL/min — ABNORMAL LOW (ref >60)
GFR calc non Af Amer: 20 mL/min — ABNORMAL LOW (ref >60)
Glucose, Bld: 111 mg/dL — ABNORMAL HIGH (ref 70–99)
Potassium: 5 mmol/L (ref 3.5–5.1)
Sodium: 137 mmol/L (ref 135–145)
Total Bilirubin: 1.6 mg/dL — ABNORMAL HIGH (ref 0.3–1.2)
Total Protein: 5.4 g/dL — ABNORMAL LOW (ref 6.5–8.1)

## 2019-01-29 LAB — GLUCOSE, CAPILLARY
Glucose-Capillary: 119 mg/dL — ABNORMAL HIGH (ref 70–99)
Glucose-Capillary: 105 mg/dL — ABNORMAL HIGH (ref 70–99)
Glucose-Capillary: 168 mg/dL — ABNORMAL HIGH (ref 70–99)
Glucose-Capillary: 124 mg/dL — ABNORMAL HIGH (ref 70–99)

## 2019-01-29 LAB — HEMOGLOBIN A1C
Hgb A1c MFr Bld: 6.3 % — ABNORMAL HIGH (ref 4.8–5.6)
Mean Plasma Glucose: 134.11 mg/dL

## 2019-01-29 LAB — BPAM RBC
Blood Product Expiration Date: 202010202359
ISSUE DATE / TIME: 202009251649
Unit Type and Rh: 6200

## 2019-01-29 LAB — BLOOD CULTURE ID PANEL (REFLEXED)

## 2019-01-29 LAB — CBC
HCT: 23.8 % — ABNORMAL LOW (ref 39.0–52.0)
Hemoglobin: 8 g/dL — ABNORMAL LOW (ref 13.0–17.0)
MCH: 32.7 pg (ref 26.0–34.0)
MCHC: 33.6 g/dL (ref 30.0–36.0)
MCV: 97.1 fL (ref 80.0–100.0)
Platelets: 7 10*3/uL — CL (ref 150–400)
RBC: 2.45 MIL/uL — ABNORMAL LOW (ref 4.22–5.81)
RDW: 16.5 % — ABNORMAL HIGH (ref 11.5–15.5)
WBC: 0.2 10*3/uL — CL (ref 4.0–10.5)
nRBC: 0 % (ref 0.0–0.2)

## 2019-01-29 LAB — TYPE AND SCREEN
ABO/RH(D): A POS
Antibody Screen: NEGATIVE
Unit division: 0

## 2019-01-29 LAB — MRSA PCR SCREENING: MRSA by PCR: NEGATIVE

## 2019-01-29 LAB — ABO/RH: ABO/RH(D): A POS

## 2019-01-29 MED ORDER — METOPROLOL TARTRATE 5 MG/5ML IV SOLN
5.0000 mg | INTRAVENOUS | Status: DC | PRN
  Administered 2019-01-29: 5 mg via INTRAVENOUS
  Filled 2019-01-29: qty 5

## 2019-01-29 MED ORDER — SODIUM CHLORIDE 0.9% IV SOLUTION
Freq: Once | INTRAVENOUS | Status: AC
  Administered 2019-01-29: 06:00:00 via INTRAVENOUS

## 2019-01-29 NOTE — Progress Notes (Signed)
CRITICAL VALUE ALERT  Critical Value:  Platelets at 7  Date & Time Notied: 6629 01/29/19  Provider Notified:[facility]   Orders Received/Actions taken: Waiting for orders; RN will continue to monitor

## 2019-01-29 NOTE — Progress Notes (Signed)
PHARMACY - PHYSICIAN COMMUNICATION CRITICAL VALUE ALERT - BLOOD CULTURE IDENTIFICATION (BCID)  Zachary Knox is an 68 y.o. male who presented to [facility] on 01/29/2019 with a chief complaint of fatigue  Assessment:  Currently on empiric antibiotics for suspected PNA. Pt has PMH significant for NSCLC, currently pancytopenic. Pt has port-a-cath. MRSA PCR negative.  BCID 1/2 staph species, no methicillin resistance detected  Name of physician (or Provider) Contacted: Dr. Posey Pronto  Current antibiotics: cefepime, vancomycin, and azithromycin  Changes to prescribed antibiotics: Discontinue vancomycin. Continue cefepime and azithromycin.  Results for orders placed or performed during the hospital encounter of 01/29/2019  Blood Culture ID Panel (Reflexed) (Collected: 01/10/2019  1:37 PM)  Result Value Ref Range   Enterococcus species NOT DETECTED NOT DETECTED   Listeria monocytogenes NOT DETECTED NOT DETECTED   Staphylococcus species DETECTED (A) NOT DETECTED   Staphylococcus aureus (BCID) NOT DETECTED NOT DETECTED   Methicillin resistance NOT DETECTED NOT DETECTED   Streptococcus species NOT DETECTED NOT DETECTED   Streptococcus agalactiae NOT DETECTED NOT DETECTED   Streptococcus pneumoniae NOT DETECTED NOT DETECTED   Streptococcus pyogenes NOT DETECTED NOT DETECTED   Acinetobacter baumannii NOT DETECTED NOT DETECTED   Enterobacteriaceae species NOT DETECTED NOT DETECTED   Enterobacter cloacae complex NOT DETECTED NOT DETECTED   Escherichia coli NOT DETECTED NOT DETECTED   Klebsiella oxytoca NOT DETECTED NOT DETECTED   Klebsiella pneumoniae NOT DETECTED NOT DETECTED   Proteus species NOT DETECTED NOT DETECTED   Serratia marcescens NOT DETECTED NOT DETECTED   Haemophilus influenzae NOT DETECTED NOT DETECTED   Neisseria meningitidis NOT DETECTED NOT DETECTED   Pseudomonas aeruginosa NOT DETECTED NOT DETECTED   Candida albicans NOT DETECTED NOT DETECTED   Candida glabrata NOT DETECTED NOT  DETECTED   Candida krusei NOT DETECTED NOT DETECTED   Candida parapsilosis NOT DETECTED NOT DETECTED   Candida tropicalis NOT DETECTED NOT DETECTED    Lenis Noon, PharmD 01/29/2019  1:15 PM

## 2019-01-29 NOTE — Evaluation (Signed)
Physical Therapy Evaluation Patient Details Name: Zachary Knox MRN: [id number] DOB: 25-Nov-1950 Today's Date: 01/29/2019   History of Present Illness  68 yo male admitted with hypotesion, weakness, ARF, sepsis. Hx of met lung ca, DM, falls.  Clinical Impression  On eval, pt required Min assist for mobility. He walked ~15 feet x 2 (to/from bathroom)using a RW. Very unsteady and at risk for falls. Pt presents with general weakness, decreased activity tolerance, and impaired gait and balance. Discussed d/c plan with significant other-plan is for home. She is requesting in home assistance. Recommend HHPT. Will continue to follow and progress activity as tolerated.     Follow Up Recommendations Home health PT;Supervision/Assistance - 24 hour    Equipment Recommendations  Rolling walker with 5" wheels    Recommendations for Other Services       Precautions / Restrictions Precautions Precautions: Fall Precaution Comments: tachy with activity Restrictions Weight Bearing Restrictions: No      Mobility  Bed Mobility Overal bed mobility: Needs Assistance Bed Mobility: Supine to Sit     Supine to sit: Min guard;HOB elevated     General bed mobility comments: for safety, lines  Transfers Overall transfer level: Needs assistance Equipment used: Rolling walker (2 wheeled) Transfers: Sit to/from Stand Sit to Stand: Min assist;From elevated surface         General transfer comment: Assist to rise, stabilize, control descent. VCs safety, hand placement.  Ambulation/Gait Ambulation/Gait assistance: Min assist Gait Distance (Feet): 15 Feet(x2) Assistive device: Rolling walker (2 wheeled) Gait Pattern/deviations: Step-through pattern;Decreased stride length     General Gait Details: Assist to stabilize pt and manage RW safety. Unsteady and at risk for falls. HR up to 145 bpm.  Stairs            Wheelchair Mobility    Modified Rankin (Stroke Patients Only)        Balance Overall balance assessment: Needs assistance;History of Falls         Standing balance support: Bilateral upper extremity supported Standing balance-Leahy Scale: Poor                               Pertinent Vitals/Pain Pain Assessment: No/denies pain    Home Living Family/patient expects to be discharged to:: Private residence Living Arrangements: Spouse/significant other Available Help at Discharge: Family Type of Home: House Home Access: Stairs to enter   Technical brewer of Steps: 1 Home Layout: One level Home Equipment: (walking stick)      Prior Function Level of Independence: Independent               Hand Dominance        Extremity/Trunk Assessment   Upper Extremity Assessment Upper Extremity Assessment: Generalized weakness    Lower Extremity Assessment Lower Extremity Assessment: Generalized weakness    Cervical / Trunk Assessment Cervical / Trunk Assessment: Kyphotic  Communication   Communication: No difficulties  Cognition Arousal/Alertness: Awake/alert Behavior During Therapy: WFL for tasks assessed/performed Overall Cognitive Status: Impaired/Different from baseline Area of Impairment: Safety/judgement                         Safety/Judgement: Decreased awareness of safety;Decreased awareness of deficits            General Comments      Exercises     Assessment/Plan    PT Assessment Patient needs continued PT services  PT Problem List  Decreased strength;Decreased mobility;Decreased activity tolerance;Decreased balance;Decreased knowledge of use of DME;Decreased safety awareness       PT Treatment Interventions DME instruction;Gait training;Therapeutic exercise;Therapeutic activities;Patient/family education;Balance training;Functional mobility training    PT Goals (Current goals can be found in the Care Plan section)  Acute Rehab PT Goals Patient Stated Goal: none stated PT Goal  Formulation: With patient/family Time For Goal Achievement: 02/12/19 Potential to Achieve Goals: Good    Frequency Min 3X/week   Barriers to discharge        Co-evaluation               AM-PAC PT "6 Clicks" Mobility  Outcome Measure Help needed turning from your back to your side while in a flat bed without using bedrails?: A Little Help needed moving from lying on your back to sitting on the side of a flat bed without using bedrails?: A Little Help needed moving to and from a bed to a chair (including a wheelchair)?: A Little Help needed standing up from a chair using your arms (e.g., wheelchair or bedside chair)?: A Little Help needed to walk in hospital room?: A Little Help needed climbing 3-5 steps with a railing? : A Lot 6 Click Score: 17    End of Session Equipment Utilized During Treatment: Gait belt Activity Tolerance: Patient tolerated treatment well Patient left: in chair;with call bell/phone within reach;with chair alarm set;with family/visitor present   PT Visit Diagnosis: Unsteadiness on feet (R26.81);History of falling (Z91.81);Repeated falls (R29.6);Difficulty in walking, not elsewhere classified (R26.2);Muscle weakness (generalized) (M62.81)    Time: 1425-1440 PT Time Calculation (min) (ACUTE ONLY): 15 min   Charges:   PT Evaluation $PT Eval Moderate Complexity: [place], PT Acute Rehabilitation Services Pager: [phone] Office: [phone]

## 2019-01-29 NOTE — Progress Notes (Signed)
[facility] Progress Note  Patient: Zachary Knox PFX:[id number]   PCP: Vincente Liberty, MD DOB: November 23, 1950   DOA: 01/23/2019   DOS: 01/29/2019   Date of Service: the patient was seen and examined on 01/29/2019  Chief Complaint  Patient presents with  . Hypotension   Brief hospital course: Zachary Knox is a 68 y.o. 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 Last chemo: 01/19/2019 keytruda alimta carboplatin Patient presented with complaints of generalized fatigue poor p.o. intake.  No nausea no vomiting.  He also has some cough and shortness of breath.  He has been taking Tussionex without any benefit.  No diarrhea no constipation. With this the patient presented to cancer center with IV fluid appointment.  His blood pressure was significantly low.  Patient was transferred to emergency department further work-up. At the time of my evaluation denies any complaints of headache, nausea, vomiting.  No shortness of breath no chest pain abdominal pain.  Has some cough.  Currently further plan is continue IV antibiotics.  Subjective: Denies any acute complaint.  No nausea no vomiting.  No acute events overnight.  Continues to have cough and shortness of breath.  No fever no chills.  Assessment and Plan: 1. Sepsis due to pneumonia ([place]) Hypotensive, tachycardic, has pancytopenia and chest x-ray showing increased infiltrate on the right side. Given that the patient is on chemotherapy he will be treated with healthcare associated coverage. We will treat with IV cefepime and Flagyl.  Discontinue vancomycin Follow-up on cultures. Blood pressure improving.  2.  Pancytopenia. 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 EDP discussed with oncology. I  personally discussed with oncology on 01/29/2019. Currently not recommending Granix  Neutropenic precautions.  3.  Acute kidney injury on chronic kidney disease. Poor p.o. intake. Metabolic acidosis Hyperkalemia Prerenal etiology. Ultrasound renal negative for hydronephrosis. Baseline serum creatinine 1.7.  Presents with serum creatinine of 3.50.  Mild lactic acidosis as well. Continue to aggressively hydrate with IV fluids.  Monitor in stepdown unit.  4.  Thrombocytopenia (HCC) Severe. Likely secondary to chemotherapy. Potentially can get worse in the setting of sepsis as well as hydration. Transfuse for platelets less than 10 or active bleeding. Monitor in stepdown for spontaneous bleeding.  5  Insulin dependent diabetes mellitus ([place]) Uncontrolled with hyperglycemia. 6.3 hemoglobin A1c. Holding long-acting Toujeo. Placing on sliding scale insulin only.  6.  Pain control. Continue home regimen.  7.  Pressure ulcer.  Sacral.  Stage II.  POA.  Continue foam dressing.  Pressure Injury 01/22/2019 Sacrum Posterior;Mid Stage II -  Partial thickness loss of dermis presenting as a shallow open ulcer with a red, pink wound bed without slough. (Active)  01/30/2019 2000  Location: Sacrum  Location Orientation: Posterior;Mid  Staging: Stage II -  Partial thickness loss of dermis presenting as a shallow open ulcer with a red, pink wound bed without slough.  Wound Description (Comments):   Present on Admission:      Diet: Cardiac diet  DVT Prophylaxis: SCD, pharmacological prophylaxis contraindicated due to Thrombocytopenia  Advance goals of care discussion: Full code  Family Communication: no family was present at bedside, at the time of interview.   Disposition:  Discharge to home.  Consultants: none Procedures: none  Scheduled Meds: . Chlorhexidine Gluconate Cloth  6 each Topical Daily  . chlorproMAZINE  25 mg Oral  TID  . docusate sodium  100 mg Oral BID  . insulin  aspart  0-5 Units Subcutaneous QHS  . insulin aspart  0-9 Units Subcutaneous TID WC  . insulin aspart  3 Units Subcutaneous TID WC  . levothyroxine  100 mcg Oral Daily  . mirtazapine  7.5 mg Oral QHS  . pantoprazole  40 mg Oral BID   Continuous Infusions: . azithromycin Stopped (01/21/2019 2106)  . ceFEPime (MAXIPIME) IV Stopped (01/29/19 2008)  . lactated ringers Stopped (01/26/2019 1909)   PRN Meds: acetaminophen **OR** acetaminophen, chlorpheniramine-HYDROcodone, ondansetron **OR** ondansetron (ZOFRAN) IV, polyethylene glycol Antibiotics: Anti-infectives (From admission, onward)   Start     Dose/Rate Route Frequency Ordered Stop   01/30/19 1400  vancomycin (VANCOCIN) IVPB 1000 mg/200 mL premix  Status:  Discontinued     1,000 mg 200 mL/hr over 60 Minutes Intravenous Every 48 hours 01/12/2019 1935 01/29/19 1222   01/29/19 1600  ceFEPIme (MAXIPIME) 2 g in sodium chloride 0.9 % 100 mL IVPB     2 g 200 mL/hr over 30 Minutes Intravenous Every 24 hours 01/07/2019 1932     01/29/2019 2000  azithromycin (ZITHROMAX) 500 mg in sodium chloride 0.9 % 250 mL IVPB     500 mg 250 mL/hr over 60 Minutes Intravenous Every 24 hours 01/31/2019 1928     01/13/2019 1500  ceFEPIme (MAXIPIME) 2 g in sodium chloride 0.9 % 100 mL IVPB     2 g 200 mL/hr over 30 Minutes Intravenous STAT 01/18/2019 1446 01/26/2019 1910   01/23/2019 1500  vancomycin (VANCOCIN) 1,500 mg in sodium chloride 0.9 % 500 mL IVPB     1,500 mg 250 mL/hr over 120 Minutes Intravenous STAT 01/30/2019 1446 01/20/2019 1719       Objective: Physical Exam: Vitals:   01/29/19 1700 01/29/19 1800 01/29/19 1900 01/29/19 1901  BP: 109/71 (!) 158/90 (!) 149/101   Pulse: (!) 120 (!) 132 (!) 116   Resp: 18 (!) 24 (!) 30   Temp:    98 F (36.7 C)  TempSrc:    Oral  SpO2: 100% 100% 97%   Weight:        Intake/Output Summary (Last 24 hours) at 01/29/2019 2032 Last data filed at 01/29/2019 1800 Gross per 24 hour  Intake 1183 ml  Output 1950 ml  Net -767 ml    Filed Weights   01/29/19 0353  Weight: 74.1 kg   General: alert and oriented to time, place, and person. Appear in mild distress, affect appropriate Eyes: PERRL, Conjunctiva normal ENT: Oral Mucosa Clear, moist  Neck: no JVD, no Abnormal Mass Or lumps Cardiovascular: S1 and S2 Present, no Murmur, peripheral pulses symmetrical Respiratory: good respiratory effort, Bilateral Air entry equal and Decreased, no signs of accessory muscle use, bilateral  Crackles, no wheezes Abdomen: Bowel Sound present, Soft and no tenderness, no hernia Skin: no rashes  Extremities: no Pedal edema, no calf tenderness Neurologic: without any new focal findings Gait not checked due to patient safety concerns  Data Reviewed: I have personally reviewed and interpreted daily labs, tele strips, imagings as discussed above. I reviewed all nursing notes, pharmacy notes, vitals, pertinent old records I have discussed plan of care as described above with RN and patient/family.  CBC: Recent Labs  Lab 01/25/19 1142 01/05/2019 1140 01/07/2019 1305 01/29/19 0330  WBC 5.1 0.4* 0.3* 0.2*  NEUTROABS 4.4 0.2* 0.2*  --   HGB 9.7* 7.7* 7.4* 8.0*  HCT 29.1* 24.3* 22.9* 23.8*  MCV 97.7 101.3* 102.7*  97.1  PLT 61* 14* 12* 7*   Basic Metabolic Panel: Recent Labs  Lab 01/25/19 1142 01/29/2019 1140 01/31/2019 1305 01/29/19 0330  NA 137 135 134* 137  K 5.1 5.4* 5.3* 5.0  CL 105 105 107 112*  CO2 19* 15* 15* 15*  GLUCOSE 184* 172* 170* 111*  BUN 24* 50* 54* 50*  CREATININE 1.91* 3.50* 3.66* 3.10*  CALCIUM 9.5 8.7* 8.1* 7.7*    Liver Function Tests: Recent Labs  Lab 01/25/19 1142 01/24/2019 1140 01/19/2019 1305 01/29/19 0330  AST '21 18 20 23  ' ALT '12 9 15 14  ' ALKPHOS 89 73 58 51  BILITOT 1.5* 1.0 1.3* 1.6*  PROT 7.3 6.5 5.9* 5.4*  ALBUMIN 2.8* 2.3* 2.4* 2.2*   No results for input(s): LIPASE, AMYLASE in the last 168 hours. No results for input(s): AMMONIA in the last 168 hours. Coagulation Profile: No results  for input(s): INR, PROTIME in the last 168 hours. Cardiac Enzymes: No results for input(s): CKTOTAL, CKMB, CKMBINDEX, TROPONINI in the last 168 hours. BNP (last 3 results) No results for input(s): PROBNP in the last 8760 hours. CBG: Recent Labs  Lab 01/24/2019 1807 01/20/2019 2146 01/29/19 0733 01/29/19 1104 01/29/19 1711  GLUCAP 131* 126* 119* 105* 168*   Studies: No results found.   Time spent: 35 minutes  Author: Berle Mull, MD [facility] 01/29/2019 8:32 PM  To reach On-call, see care teams to locate the attending and reach out to them via [email or web address]. If 7PM-7AM, please contact night-coverage If you still have difficulty reaching the attending provider, please page the [facility] (Director on Call) for [facility] on amion for assistance.

## 2019-01-30 ENCOUNTER — Inpatient Hospital Stay (HOSPITAL_COMMUNITY): Payer: Medicare Other

## 2019-01-30 LAB — URINE CULTURE: Culture: NO GROWTH

## 2019-01-30 LAB — CBC WITH DIFFERENTIAL/PLATELET
Abs Immature Granulocytes: 0.03 10*3/uL (ref 0.00–0.07)
Basophils Absolute: 0 10*3/uL (ref 0.0–0.1)
Basophils Relative: 0 %
Eosinophils Absolute: 0 10*3/uL (ref 0.0–0.5)
Eosinophils Relative: 22 %
HCT: 23.1 % — ABNORMAL LOW (ref 39.0–52.0)
Hemoglobin: 7.6 g/dL — ABNORMAL LOW (ref 13.0–17.0)
Immature Granulocytes: 17 %
Lymphocytes Relative: 28 %
Lymphs Abs: 0.1 10*3/uL — ABNORMAL LOW (ref 0.7–4.0)
MCH: 31.8 pg (ref 26.0–34.0)
MCHC: 32.9 g/dL (ref 30.0–36.0)
MCV: 96.7 fL (ref 80.0–100.0)
Monocytes Absolute: 0 10*3/uL — ABNORMAL LOW (ref 0.1–1.0)
Monocytes Relative: 11 %
Neutro Abs: 0 10*3/uL — ABNORMAL LOW (ref 1.7–7.7)
Neutrophils Relative %: 22 %
Platelets: 30 10*3/uL — ABNORMAL LOW (ref 150–400)
RBC: 2.39 MIL/uL — ABNORMAL LOW (ref 4.22–5.81)
RDW: 16.9 % — ABNORMAL HIGH (ref 11.5–15.5)
WBC: 0.2 10*3/uL — CL (ref 4.0–10.5)
nRBC: 0 % (ref 0.0–0.2)

## 2019-01-30 LAB — COMPREHENSIVE METABOLIC PANEL
ALT: 15 U/L (ref 0–44)
AST: 25 U/L (ref 15–41)
Albumin: 2.1 g/dL — ABNORMAL LOW (ref 3.5–5.0)
Alkaline Phosphatase: 60 U/L (ref 38–126)
Anion gap: 12 (ref 5–15)
BUN: 39 mg/dL — ABNORMAL HIGH (ref 8–23)
CO2: 16 mmol/L — ABNORMAL LOW (ref 22–32)
Calcium: 6.3 mg/dL — CL (ref 8.9–10.3)
Chloride: 113 mmol/L — ABNORMAL HIGH (ref 98–111)
Creatinine, Ser: 2.28 mg/dL — ABNORMAL HIGH (ref 0.61–1.24)
GFR calc Af Amer: 33 mL/min — ABNORMAL LOW (ref >60)
GFR calc non Af Amer: 28 mL/min — ABNORMAL LOW (ref >60)
Glucose, Bld: 149 mg/dL — ABNORMAL HIGH (ref 70–99)
Potassium: 5.6 mmol/L — ABNORMAL HIGH (ref 3.5–5.1)
Sodium: 141 mmol/L (ref 135–145)
Total Bilirubin: 1.4 mg/dL — ABNORMAL HIGH (ref 0.3–1.2)
Total Protein: 5.6 g/dL — ABNORMAL LOW (ref 6.5–8.1)

## 2019-01-30 LAB — PREPARE PLATELET PHERESIS: Unit division: 0

## 2019-01-30 LAB — MAGNESIUM: Magnesium: 0.9 mg/dL — CL (ref 1.7–2.4)

## 2019-01-30 LAB — BPAM PLATELET PHERESIS
Blood Product Expiration Date: 202009262359
ISSUE DATE / TIME: 202009260531
Unit Type and Rh: 9500

## 2019-01-30 LAB — GLUCOSE, CAPILLARY
Glucose-Capillary: 132 mg/dL — ABNORMAL HIGH (ref 70–99)
Glucose-Capillary: 156 mg/dL — ABNORMAL HIGH (ref 70–99)
Glucose-Capillary: 141 mg/dL — ABNORMAL HIGH (ref 70–99)

## 2019-01-30 LAB — LACTIC ACID, PLASMA: Lactic Acid, Venous: 1.4 mmol/L (ref 0.5–1.9)

## 2019-01-30 MED ORDER — METOPROLOL TARTRATE 25 MG PO TABS
25.0000 mg | ORAL_TABLET | Freq: Two times a day (BID) | ORAL | Status: DC
  Administered 2019-01-30 – 2019-01-31 (×4): 25 mg via ORAL
  Filled 2019-01-30 (×4): qty 1

## 2019-01-30 MED ORDER — SODIUM ZIRCONIUM CYCLOSILICATE 10 G PO PACK
10.0000 g | PACK | Freq: Every day | ORAL | Status: DC
  Administered 2019-01-30: 10 g via ORAL
  Filled 2019-01-30 (×2): qty 1

## 2019-01-30 MED ORDER — METOPROLOL TARTRATE 5 MG/5ML IV SOLN
5.0000 mg | Freq: Three times a day (TID) | INTRAVENOUS | Status: DC | PRN
  Administered 2019-01-30: 05:00:00 5 mg via INTRAVENOUS
  Filled 2019-01-30: qty 5

## 2019-01-30 MED ORDER — MAGNESIUM SULFATE 4 GM/100ML IV SOLN
4.0000 g | Freq: Once | INTRAVENOUS | Status: AC
  Administered 2019-01-30: 4 g via INTRAVENOUS
  Filled 2019-01-30: qty 100

## 2019-01-30 MED ORDER — CALCIUM GLUCONATE-NACL 1-0.675 GM/50ML-% IV SOLN
1.0000 g | Freq: Once | INTRAVENOUS | Status: AC
  Administered 2019-01-30: 1000 mg via INTRAVENOUS
  Filled 2019-01-30: qty 50

## 2019-01-30 MED ORDER — ENSURE ENLIVE PO LIQD
237.0000 mL | Freq: Two times a day (BID) | ORAL | Status: DC
  Administered 2019-01-30 – 2019-02-03 (×4): 237 mL via ORAL

## 2019-01-30 NOTE — Progress Notes (Signed)
PHARMACY - PHYSICIAN COMMUNICATION CRITICAL VALUE ALERT - BLOOD CULTURE IDENTIFICATION (BCID)  Zachary Knox is an 68 y.o. male who presented to [facility] on 01/22/2019 with a chief complaint of fatigue  Assessment:  Currently on empiric antibiotics for suspected PNA. Pt has PMH significant for NSCLC, currently pancytopenic. Pt has port-a-cath. MRSA PCR negative.   1/4 GPR in anaerobic bottle, no BCID run--likely contaminant  Name of physician (or Provider) Contacted: Dr. Posey Pronto  Current antibiotics: cefepime and azithromycin  Changes to prescribed antibiotics recommended:  none  Results for orders placed or performed during the hospital encounter of 01/27/2019  Blood Culture ID Panel (Reflexed) (Collected: 01/27/2019  1:37 PM)  Result Value Ref Range   Enterococcus species NOT DETECTED NOT DETECTED   Listeria monocytogenes NOT DETECTED NOT DETECTED   Staphylococcus species DETECTED (A) NOT DETECTED   Staphylococcus aureus (BCID) NOT DETECTED NOT DETECTED   Methicillin resistance NOT DETECTED NOT DETECTED   Streptococcus species NOT DETECTED NOT DETECTED   Streptococcus agalactiae NOT DETECTED NOT DETECTED   Streptococcus pneumoniae NOT DETECTED NOT DETECTED   Streptococcus pyogenes NOT DETECTED NOT DETECTED   Acinetobacter baumannii NOT DETECTED NOT DETECTED   Enterobacteriaceae species NOT DETECTED NOT DETECTED   Enterobacter cloacae complex NOT DETECTED NOT DETECTED   Escherichia coli NOT DETECTED NOT DETECTED   Klebsiella oxytoca NOT DETECTED NOT DETECTED   Klebsiella pneumoniae NOT DETECTED NOT DETECTED   Proteus species NOT DETECTED NOT DETECTED   Serratia marcescens NOT DETECTED NOT DETECTED   Haemophilus influenzae NOT DETECTED NOT DETECTED   Neisseria meningitidis NOT DETECTED NOT DETECTED   Pseudomonas aeruginosa NOT DETECTED NOT DETECTED   Candida albicans NOT DETECTED NOT DETECTED   Candida glabrata NOT DETECTED NOT DETECTED   Candida krusei NOT DETECTED NOT  DETECTED   Candida parapsilosis NOT DETECTED NOT DETECTED   Candida tropicalis NOT DETECTED NOT DETECTED    Dolly Rias RPh 01/30/2019, 7:40 AM Pager [phone]

## 2019-01-30 NOTE — Progress Notes (Addendum)
[facility] Progress Note  Patient: Zachary Knox ZDG:[id number]   PCP: Vincente Liberty, MD DOB: [birth year]   DOA: 01/14/2019   DOS: 01/30/2019   Date of Service: the patient was seen and examined on 01/30/2019  Chief Complaint  Patient presents with  . 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 Last chemo: 01/19/2019 keytruda alimta carboplatin Patient presented with complaints of generalized fatigue poor p.o. intake. No nausea no vomiting. He also has some cough and shortness of breath. He has been taking Tussionex without any benefit. No diarrhea no constipation. With this the patient presented to cancer center with IV fluid appointment. His blood pressure was significantly low. Patient was transferred to emergency department further work-up. At the time of my evaluation denies any complaints of headache, nausea, vomiting. No shortness of breath no chest pain abdominal pain. Has some cough.  Currently further plan is continue antibiotics.  Subjective: Continues to have cough but some improvement.  No nausea or vomiting.  No diarrhea or constipation.  No chest pain abdominal pain.  No fever.  Remains tachycardic overnight.  Sinus tachycardia on telemetry.  Assessment and Plan: 1.Sepsis due to pneumonia ([place]) Hypotensive, tachycardic, has pancytopenia and chest x-ray showing increased infiltrate on the right side. Given that the patient is on chemotherapy he will be treated with healthcare associated coverage. MRSA PCR negative, blood culture is growing gram-positive rods likely contaminant.  Urine culture is negative. Initially treated with IV vancomycin cefepime and Flagyl. We will only continue cefepime. Follow-up on cultures.  Blood pressure improving.  Still having significant sinus tachycardia.  2.Pancytopenia. 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 EDP discussed with oncology. I personally discussed with oncology on 01/29/2019. Currently not recommending Granix  Neutropenic precautions.  3.Acute kidney injury on chronic kidney disease. Poor p.o. intake. Metabolic acidosis Hyperkalemia Prerenal etiology. Ultrasound renal negative for hydronephrosis. Baseline serum creatinine 1.7. Presents with serum creatinine of 3.50. Now trending down. Patient's oral intake is also improving. Lactic acidosis is resolved. Currently I will change the fluid to half-normal saline. Potassium 5.6. We will start the patient on [place].  4.Thrombocytopenia (HCC) Severe. Likely secondary to chemotherapy. Potentially can get worse in the setting of sepsis as well as hydration. Transfuse for platelets less than 10 or active bleeding. S/P 1 transfusion on 01/29/2019, With improvement in platelets from 7-30.  5Insulin dependent diabetes mellitus ([place]) with renal complication Controlled 6.3 hemoglobin A1c. Holding long-acting [place]. Continue on sliding scale insulin only.  Blood sugars well controlled  6.Pain control. Chest pain chronic cancer related Continue home regimen.  7.  Anemia secondary to chemotherapy. Patient received 1 PRBC transfusion on 01/27/2019. No active bleeding reported. H&H remained stable posttransfusion.  8.  Essential hypertension. Sinus tachycardia. Blood pressure improving as the patient initially presented with hypotension. Patient's antihypertensive medications were on hold. Patient takes 100 mg of Toprol-XL twice daily at home. Currently we will resume 25 mg Lopressor twice daily with holding parameters. Holding ramipril and Norvasc.  9.  Pressure ulcer.  Sacral.  Stage II.  POA.  Continue foam dressing.   Pressure Injury 01/11/2019 Sacrum Posterior;Mid Stage II -  Partial thickness loss of dermis presenting as a shallow open ulcer with a red, pink wound bed without slough. (Active)  01/27/2019 2000  Location: Sacrum  Location  Orientation: Posterior;Mid  Staging: Stage II -  Partial thickness loss of dermis presenting as a shallow open ulcer with a red, pink wound bed without slough.  Wound Description (Comments):   Present on Admission:     Diet: Cardiac diet and Carb modified diet  DVT Prophylaxis: SCD, pharmacological prophylaxis contraindicated due to Thrombocytopenia  Advance goals of care discussion: Full code  Family Communication: no family was present at bedside, at the time of interview.   Disposition:  Discharge to be determined .  Remains in stepdown unit due to severe tachycardia.  Consultants: none Procedures: none  Scheduled Meds: . Chlorhexidine Gluconate Cloth  6 each Topical Daily  . chlorproMAZINE  25 mg Oral TID  . docusate sodium  100 mg Oral BID  . insulin aspart  0-5 Units Subcutaneous QHS  . insulin aspart  0-9 Units Subcutaneous TID WC  . insulin aspart  3 Units Subcutaneous TID WC  . levothyroxine  100 mcg Oral Daily  . metoprolol tartrate  25 mg Oral BID  . mirtazapine  7.5 mg Oral QHS  . pantoprazole  40 mg Oral BID  . sodium zirconium cyclosilicate  10 g Oral Daily   Continuous Infusions: . azithromycin Stopped (01/30/19 0057)  . calcium gluconate    . ceFEPime (MAXIPIME) IV Stopped (01/29/19 2008)   PRN Meds: acetaminophen **OR** acetaminophen, chlorpheniramine-HYDROcodone, metoprolol tartrate, ondansetron **OR** ondansetron (ZOFRAN) IV, polyethylene glycol Antibiotics: Anti-infectives (From admission, onward)   Start     Dose/Rate Route Frequency Ordered Stop   01/30/19 1400  vancomycin (VANCOCIN) IVPB 1000 mg/200 mL premix  Status:  Discontinued     1,000 mg 200 mL/hr over 60 Minutes Intravenous Every 48 hours 02/02/2019 1935 01/29/19 1222    01/29/19 1600  ceFEPIme (MAXIPIME) 2 g in sodium chloride 0.9 % 100 mL IVPB     2 g 200 mL/hr over 30 Minutes Intravenous Every 24 hours 01/20/2019 1932     01/30/2019 2000  azithromycin (ZITHROMAX) 500 mg in sodium chloride 0.9 % 250 mL IVPB     500 mg 250 mL/hr over 60 Minutes Intravenous Every 24 hours 01/27/2019 1928     01/27/2019 1500  ceFEPIme (MAXIPIME) 2 g in sodium chloride 0.9 % 100 mL IVPB     2 g 200 mL/hr over 30 Minutes Intravenous STAT 01/18/2019 1446 01/20/2019 1910   01/09/2019 1500  vancomycin (VANCOCIN) 1,500 mg in sodium chloride 0.9 % 500 mL IVPB     1,500 mg 250 mL/hr over 120 Minutes Intravenous STAT 01/13/2019 1446 01/06/2019 1719       Objective: Physical Exam: Vitals:   01/30/19 0453 01/30/19 0500 01/30/19 0600 01/30/19 0702  BP:   113/84   Pulse:      Resp: 19  20   Temp:    97.6 F (36.4 C)  TempSrc:    Oral  SpO2:   100%   Weight:  73.9 kg      Intake/Output Summary (Last 24 hours) at 01/30/2019 1005 Last data filed at 01/30/2019 0630 Gross per 24 hour  Intake 1350 ml  Output 2950 ml  Net -1600 ml   Filed Weights   01/29/19 0353 01/30/19 0500  Weight: 74.1 kg 73.9 kg   General: alert and oriented to time, place, and person. Appear in mild distress, affect appropriate Eyes: PERRL, Conjunctiva normal ENT: Oral Mucosa Clear, moist  Neck: no JVD, no Abnormal Mass Or lumps Cardiovascular: S1 and S2 Present, no Murmur, peripheral pulses symmetrical Respiratory: good respiratory effort,  Bilateral Air entry equal and Decreased, no signs of accessory muscle use, bilateral  Crackles, no wheezes Abdomen: Bowel Sound present, Soft and no tenderness, no hernia Skin: no rashes  Extremities: trace Pedal edema, no calf tenderness Neurologic: without any new focal findings Gait not checked due to patient safety concerns   Data Reviewed: I have personally reviewed and interpreted daily labs, tele strips, imagings as discussed above. I reviewed all nursing notes, pharmacy  notes, vitals, pertinent old records I have discussed plan of care as described above with RN and patient/family.  CBC: Recent Labs  Lab 01/25/19 1142 01/09/2019 1140 01/20/2019 1305 01/29/19 0330 01/30/19 0500  WBC 5.1 0.4* 0.3* 0.2* 0.2*  NEUTROABS 4.4 0.2* 0.2*  --  0.0*  HGB 9.7* 7.7* 7.4* 8.0* 7.6*  HCT 29.1* 24.3* 22.9* 23.8* 23.1*  MCV 97.7 101.3* 102.7* 97.1 96.7  PLT 61* 14* 12* 7* 30*   Basic Metabolic Panel: Recent Labs  Lab 01/25/19 1142 01/06/2019 1140 02/02/2019 1305 01/29/19 0330 01/30/19 0500  NA 137 135 134* 137 141  K 5.1 5.4* 5.3* 5.0 5.6*  CL 105 105 107 112* 113*  CO2 19* 15* 15* 15* 16*  GLUCOSE 184* 172* 170* 111* 149*  BUN 24* 50* 54* 50* 39*  CREATININE 1.91* 3.50* 3.66* 3.10* 2.28*  CALCIUM 9.5 8.7* 8.1* 7.7* 6.3*  MG  --   --   --   --  0.9*    Liver Function Tests: Recent Labs  Lab 01/25/19 1142 01/05/2019 1140 01/21/2019 1305 01/29/19 0330 01/30/19 0500  AST _0 ALT _1 ALKPHOS 89 73 58 51 60  BILITOT 1.5* 1.0 1.3* 1.6* 1.4*  PROT 7.3 6.5 5.9* 5.4* 5.6*  ALBUMIN 2.8* 2.3* 2.4* 2.2* 2.1*   No results for input(s): LIPASE, AMYLASE in the last 168 hours. No results for input(s): AMMONIA in the last 168 hours. Coagulation Profile: No results for input(s): INR, PROTIME in the last 168 hours. Cardiac Enzymes: No results for input(s): CKTOTAL, CKMB, CKMBINDEX, TROPONINI in the last 168 hours. BNP (last 3 results) No results for input(s): PROBNP in the last 8760 hours. CBG: Recent Labs  Lab 01/29/19 0733 01/29/19 1104 01/29/19 1711 01/29/19 2208 01/30/19 0749  GLUCAP 119* 105* 168* 124* 132*   Studies: No results found.   Time spent: 35 minutes  Author: Berle Mull, MD [facility] 01/30/2019 10:05 AM  To reach On-call, see care teams to locate the attending and reach out to them via [email or web address]. If 7PM-7AM, please contact night-coverage If you still have difficulty reaching the attending  provider, please page the [facility] (Director on Call) for [facility] on amion for assistance.

## 2019-01-30 NOTE — Evaluation (Signed)
Occupational Therapy Evaluation Patient Details Name: Zachary Knox MRN: [id number] DOB: 04/19/1951 Today's Date: 01/30/2019    History of Present Illness 68 yo male admitted with hypotesion, weakness, ARF, sepsis. Hx of met lung ca, DM, falls.   Clinical Impression   Pt admitted with above diagnoses, with generalized weakness and decreased activity tolerance limiting ability to engage in BADL at desired level of ind. PTA pt reports he was able to complete BADL ind and had intermittent assist from wife for IADL. At time of eval, he is min guard for bed mobility and min A for sit <> stand based transfers. Pt fatigues easily and has lack of safety awareness/awareness of deficits, requiring intermittent mod A/correction with functional mobility and tasks. At this time, recommend HHOT for safe progression on BADL in home environment. Pt will benefit from acute OT services, will follow from [place] listed below.     Follow Up Recommendations  Home health OT;Supervision/Assistance - 24 hour    Equipment Recommendations  3 in 1 bedside commode    Recommendations for Other Services       Precautions / Restrictions Precautions Precautions: Fall Precaution Comments: tachy with activity Restrictions Weight Bearing Restrictions: No      Mobility Bed Mobility Overal bed mobility: Needs Assistance Bed Mobility: Supine to Sit     Supine to sit: Min guard;HOB elevated     General bed mobility comments: increased time, line management  Transfers Overall transfer level: Needs assistance Equipment used: Rolling walker (2 wheeled) Transfers: Sit to/from Stand Sit to Stand: Min assist         General transfer comment: min A to rise and control descent safely, VC's for safety and for safe hand placement    Balance Overall balance assessment: Needs assistance;History of Falls Sitting-balance support: Bilateral upper extremity supported Sitting balance-Leahy Scale: Fair     Standing  balance support: Bilateral upper extremity supported;During functional activity Standing balance-Leahy Scale: Poor Standing balance comment: reliant on BUE support                           ADL either performed or assessed with clinical judgement   ADL Overall ADL's : Needs assistance/impaired Eating/Feeding: Set up;Sitting Eating/Feeding Details (indicate cue type and reason): in recliner for lunch Grooming: Minimal assistance;Moderate assistance;Standing Grooming Details (indicate cue type and reason): decreased dynamic standing balance Upper Body Bathing: Minimal assistance;Sitting   Lower Body Bathing: Moderate assistance;Sit to/from stand;Sitting/lateral leans   Upper Body Dressing : Minimal assistance;Sitting   Lower Body Dressing: Moderate assistance;Sitting/lateral leans;Sit to/from stand   Toilet Transfer: Moderate assistance;Minimal assistance;BSC;RW;Cueing for safety Toilet Transfer Details (indicate cue type and reason): requires steadying assist, safety cueing Toileting- Clothing Manipulation and Hygiene: Minimal assistance;Sit to/from stand;Sitting/lateral lean   Tub/ Shower Transfer: Moderate assistance;Shower seat;Ambulation;3 in 1   Functional mobility during ADLs: Minimal assistance;Rolling walker(mod further distances) General ADL Comments: pt litd by gen weakness and decreased activity tolerance     Vision Baseline Vision/History: No visual deficits Patient Visual Report: No change from baseline       Perception     Praxis      Pertinent Vitals/Pain Pain Assessment: No/denies pain     Hand Dominance Right   Extremity/Trunk Assessment Upper Extremity Assessment Upper Extremity Assessment: Generalized weakness   Lower Extremity Assessment Lower Extremity Assessment: Defer to PT evaluation       Communication Communication Communication: No difficulties   Cognition Arousal/Alertness: Awake/alert Behavior During Therapy:  WFL for  tasks assessed/performed Overall Cognitive Status: Impaired/Different from baseline Area of Impairment: Safety/judgement                         Safety/Judgement: Decreased awareness of safety;Decreased awareness of deficits         General Comments       Exercises     Shoulder Instructions      Home Living Family/patient expects to be discharged to:: Private residence Living Arrangements: Spouse/significant other Available Help at Discharge: Family Type of Home: House Home Access: Stairs to enter Technical brewer of Steps: 1   Home Layout: One level     Bathroom Shower/Tub: Occupational psychologist: Handicapped height                Prior Functioning/Environment Level of Independence: Independent        Comments: wife assisted intermittently with IADL activities        OT Problem List: Decreased strength;Decreased knowledge of use of DME or AE;Decreased knowledge of precautions;Decreased activity tolerance;Decreased cognition;Impaired balance (sitting and/or standing);Decreased safety awareness      OT Treatment/Interventions: Self-care/ADL training;Therapeutic exercise;Patient/family education;Balance training;Energy conservation;Therapeutic activities;DME and/or AE instruction;Cognitive remediation/compensation    OT Goals(Current goals can be found in the care plan section) Acute Rehab OT Goals Patient Stated Goal: to get better OT Goal Formulation: With patient Time For Goal Achievement: 02/13/19 Potential to Achieve Goals: Good  OT Frequency: Min 2X/week   Barriers to D/C:            Co-evaluation              AM-PAC OT "6 Clicks" Daily Activity     Outcome Measure Help from another person eating meals?: None Help from another person taking care of personal grooming?: A Little Help from another person toileting, which includes using toliet, bedpan, or urinal?: A Lot Help from another person bathing (including  washing, rinsing, drying)?: A Lot Help from another person to put on and taking off regular upper body clothing?: A Little Help from another person to put on and taking off regular lower body clothing?: A Lot 6 Click Score: 16   End of Session Equipment Utilized During Treatment: Gait belt;Rolling walker Nurse Communication: Mobility status  Activity Tolerance: Patient tolerated treatment well Patient left: in chair;with call bell/phone within reach;with chair alarm set  OT Visit Diagnosis: Unsteadiness on feet (R26.81);Other abnormalities of gait and mobility (R26.89);Muscle weakness (generalized) (M62.81);History of falling (Z91.81);Other symptoms and signs involving cognitive function                Time: 7373-6681 OT Time Calculation (min): 25 min Charges:  OT General Charges $OT Visit: 1 Visit OT Evaluation $OT Eval Moderate Complexity: 1 Mod OT Treatments $Self Care/Home Management : 8-22 mins  Zenovia Jarred, MSOT, OTR/L Behavioral Health OT/ Acute Relief OT [facility] Office: [phone]  Zenovia Jarred 01/30/2019, 3:34 PM

## 2019-01-30 NOTE — Progress Notes (Signed)
Initial Nutrition Assessment  INTERVENTION:   -Ensure Enlive po BID, each supplement provides 350 kcal and 20 grams of protein  NUTRITION DIAGNOSIS:   Increased nutrient needs related to cancer and cancer related treatments as evidenced by estimated needs.  GOAL:   Patient will meet greater than or equal to 90% of their needs  MONITOR:   PO intake, Supplement acceptance, Labs, Weight trends, I & O's  REASON FOR ASSESSMENT:   (Stage 2 sacral pressure injury)    ASSESSMENT:   68 y.o. 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  **RD working remotely**  Patient currently undergoing chemotherapy treatment for stage 4 lung Ca, last chemo was 9/16. Pt has not had any nutrition impact symptoms other than poor appetite. Pt is receiving Remeron for this.  No PO documented yet this admission.  Will order Ensure supplements for additional kcal and protein.  Per weigh records, pt has lost 12 lbs since 7/1 (6% wt loss x 3 months, insignificant for time frame).  I/Os: -2.2L since admit UOP 9/27 so far: 1075 ml  Medications:Remeron tablet daily   Labs reviewed:  CBGs: 132 Elevated K Low Mg GFR: 33  NUTRITION - FOCUSED PHYSICAL EXAM:  Unable to perform -working remotely.  Diet Order:   Diet Order            Diet Carb Modified Fluid consistency: Thin; Room service appropriate? Yes  Diet effective now              EDUCATION NEEDS:   No education needs have been identified at this time  Skin:  Skin Assessment: Skin Integrity Issues: Skin Integrity Issues:: Stage II Stage II: sacrum  Last BM:  9/27  Height:   Ht Readings from Last 1 Encounters:  01/25/19 5' 9" (1.753 m)    Weight:   Wt Readings from Last 1 Encounters:  01/30/19 73.9 kg    Ideal Body Weight:  72.7 kg  BMI:  Body mass  index is 24.06 kg/m.  Estimated Nutritional Needs:   Kcal:  2200-2400  Protein:  110-120g  Fluid:  2L/day  Clayton Bibles, MS, RD, LDN Inpatient Clinical Dietitian Pager: [phone] After Hours Pager: [phone]

## 2019-01-31 DIAGNOSIS — T451X5A Adverse effect of antineoplastic and immunosuppressive drugs, initial encounter: Secondary | ICD-10-CM

## 2019-01-31 DIAGNOSIS — D6481 Anemia due to antineoplastic chemotherapy: Secondary | ICD-10-CM

## 2019-01-31 DIAGNOSIS — D709 Neutropenia, unspecified: Secondary | ICD-10-CM

## 2019-01-31 DIAGNOSIS — R5081 Fever presenting with conditions classified elsewhere: Secondary | ICD-10-CM

## 2019-01-31 DIAGNOSIS — N179 Acute kidney failure, unspecified: Secondary | ICD-10-CM

## 2019-01-31 DIAGNOSIS — D696 Thrombocytopenia, unspecified: Secondary | ICD-10-CM

## 2019-01-31 DIAGNOSIS — C3491 Malignant neoplasm of unspecified part of right bronchus or lung: Secondary | ICD-10-CM

## 2019-01-31 LAB — CBC WITH DIFFERENTIAL/PLATELET
HCT: 21.9 % — ABNORMAL LOW (ref 39.0–52.0)
Hemoglobin: 7.2 g/dL — ABNORMAL LOW (ref 13.0–17.0)
MCH: 32 pg (ref 26.0–34.0)
MCHC: 32.9 g/dL (ref 30.0–36.0)
MCV: 97.3 fL (ref 80.0–100.0)
Platelets: 16 10*3/uL — CL (ref 150–400)
RBC: 2.25 MIL/uL — ABNORMAL LOW (ref 4.22–5.81)
RDW: 16.8 % — ABNORMAL HIGH (ref 11.5–15.5)
WBC: 0.2 10*3/uL — CL (ref 4.0–10.5)
nRBC: 0 % (ref 0.0–0.2)

## 2019-01-31 LAB — COMPREHENSIVE METABOLIC PANEL
ALT: 22 U/L (ref 0–44)
AST: 40 U/L (ref 15–41)
Albumin: 2.1 g/dL — ABNORMAL LOW (ref 3.5–5.0)
Alkaline Phosphatase: 64 U/L (ref 38–126)
Anion gap: 13 (ref 5–15)
BUN: 28 mg/dL — ABNORMAL HIGH (ref 8–23)
CO2: 15 mmol/L — ABNORMAL LOW (ref 22–32)
Calcium: 8.5 mg/dL — ABNORMAL LOW (ref 8.9–10.3)
Chloride: 109 mmol/L (ref 98–111)
Creatinine, Ser: 1.72 mg/dL — ABNORMAL HIGH (ref 0.61–1.24)
GFR calc Af Amer: 46 mL/min — ABNORMAL LOW (ref >60)
GFR calc non Af Amer: 40 mL/min — ABNORMAL LOW (ref >60)
Glucose, Bld: 143 mg/dL — ABNORMAL HIGH (ref 70–99)
Potassium: 4 mmol/L (ref 3.5–5.1)
Sodium: 137 mmol/L (ref 135–145)
Total Bilirubin: 1.4 mg/dL — ABNORMAL HIGH (ref 0.3–1.2)
Total Protein: 5.6 g/dL — ABNORMAL LOW (ref 6.5–8.1)

## 2019-01-31 LAB — GLUCOSE, CAPILLARY
Glucose-Capillary: 132 mg/dL — ABNORMAL HIGH (ref 70–99)
Glucose-Capillary: 137 mg/dL — ABNORMAL HIGH (ref 70–99)
Glucose-Capillary: 85 mg/dL (ref 70–99)
Glucose-Capillary: 82 mg/dL (ref 70–99)

## 2019-01-31 LAB — MAGNESIUM: Magnesium: 2.2 mg/dL (ref 1.7–2.4)

## 2019-01-31 MED ORDER — TBO-FILGRASTIM 300 MCG/0.5ML ~~LOC~~ SOSY
300.0000 ug | PREFILLED_SYRINGE | Freq: Every day | SUBCUTANEOUS | Status: DC
  Filled 2019-01-31: qty 0.5

## 2019-01-31 MED ORDER — TBO-FILGRASTIM 300 MCG/0.5ML ~~LOC~~ SOSY
300.0000 ug | PREFILLED_SYRINGE | Freq: Every day | SUBCUTANEOUS | Status: DC
  Administered 2019-02-01: 300 ug via SUBCUTANEOUS
  Filled 2019-01-31: qty 0.5

## 2019-01-31 MED ORDER — SODIUM ZIRCONIUM CYCLOSILICATE 10 G PO PACK
10.0000 g | PACK | Freq: Every day | ORAL | Status: DC
  Administered 2019-01-31 – 2019-02-01 (×2): 10 g via ORAL
  Filled 2019-01-31 (×2): qty 1

## 2019-01-31 MED ORDER — SODIUM CHLORIDE 0.9% FLUSH
10.0000 mL | Freq: Two times a day (BID) | INTRAVENOUS | Status: DC
  Administered 2019-01-31 – 2019-02-03 (×4): 10 mL
  Administered 2019-02-04: 22:00:00 20 mL
  Administered 2019-02-05: 10:00:00 10 mL

## 2019-01-31 MED ORDER — SODIUM CHLORIDE 0.9% FLUSH: 10.0000 mL | INTRAVENOUS | Status: DC | PRN

## 2019-01-31 MED ORDER — SODIUM CHLORIDE 0.9 % IV SOLN
2.0000 g | Freq: Two times a day (BID) | INTRAVENOUS | Status: DC
  Administered 2019-01-31 – 2019-02-03 (×6): 2 g via INTRAVENOUS
  Filled 2019-01-31 (×7): qty 2

## 2019-01-31 MED ORDER — TBO-FILGRASTIM 300 MCG/0.5ML ~~LOC~~ SOSY
300.0000 ug | PREFILLED_SYRINGE | Freq: Once | SUBCUTANEOUS | Status: AC
  Administered 2019-01-31: 11:00:00 300 ug via SUBCUTANEOUS
  Filled 2019-01-31: qty 0.5

## 2019-01-31 NOTE — Progress Notes (Signed)
CRITICAL VALUE ALERT  Critical Value:  Platelets 16 Date & Time Notied: 0910 and 01/31/19 Provider Notified: Dr. Posey Pronto  Orders Received/Actions taken:awaiting

## 2019-01-31 NOTE — Progress Notes (Signed)
Pharmacy Antibiotic Note  Zachary Knox is a 68 y.o. male admitted on 01/06/2019 with pneumonia.  Pharmacy was consulted for vancomycin and cefepime dosing. Vancomycin discontinued 9/26  Plan: Cefepime adjusted to 2gm q12  Temp (24hrs), Avg:97.7 F (36.5 C), Min:97.5 F (36.4 C), Max:98.2 F (36.8 C)  Recent Labs  Lab 01/25/2019 1140 01/18/2019 1305 02/01/2019 1527 01/29/19 0330 01/30/19 0500 01/31/19 0645  WBC 0.4* 0.3*  --  0.2* 0.2* 0.2*  CREATININE 3.50* 3.66*  --  3.10* 2.28* 1.72*  LATICACIDVEN  --  2.1* 3.6*  --  1.4  --     Estimated Creatinine Clearance: 41.1 mL/min (A) (by C-G formula based on SCr of 1.72 mg/dL (H)).    No Known Allergies  Antimicrobials this admission: 9/25 cefepime> 9/25 vanc> 9/26 9/25 azith>>  Dose adjustments this admission: 9/28 Cefepime 2gm q24  to q12 for improved clearance  Microbiology results: 9/25 UCx: ng-final 9/25 BCx1: 1/2 staph species, mecA negative - consider contaminant 1/4 GPR in aerobic bottle: likely contaminant 9/25 MRSA PCR: neg  Thank you for allowing pharmacy to be a part of this patient's care.  Minda Ditto PharmD Pager [phone] 01/31/2019, 11:02 AM

## 2019-01-31 NOTE — Progress Notes (Signed)
HEMATOLOGY-ONCOLOGY PROGRESS NOTE  SUBJECTIVE: The patient was brought to the emergency room for the cancer center after he was found to be hypotensive.  He was noted to have acute kidney injury.  He was found to have pancytopenia.  He received 1 unit packed red blood cells on 01/06/2019 and 1 unit of platelets on 01/29/2019.  He currently denies any bleeding.  Has remained afebrile.  Hypotension has resolved but he remains tachycardic.  Reports some right-sided chest discomfort with coughing.  Denies shortness of breath.  Denies nausea and vomiting.  The patient is currently receiving maintenance Alimta and Keytruda every 3 weeks.  Last cycle was given on 01/19/2019.  Oncology History  Adenocarcinoma of right lung, stage 4 ([place])  08/19/2018 Initial Diagnosis   Adenocarcinoma of right lung, stage 4 ([place])   08/24/2018 - 09/27/2018 Chemotherapy   The patient had palonosetron (ALOXI) injection 0.25 mg, 0.25 mg, Intravenous,  Once, 4 of 5 cycles Administration: 0.25 mg (08/24/2018), 0.25 mg (08/30/2018), 0.25 mg (09/06/2018), 0.25 mg (09/13/2018) CARBOplatin (PARAPLATIN) 210 mg in sodium chloride 0.9 % 250 mL chemo infusion, 210 mg (100 % of original dose 205 mg), Intravenous,  Once, 4 of 5 cycles Dose modification: 205 mg (original dose 205 mg, Cycle 1) Administration: 210 mg (08/24/2018), 210 mg (08/30/2018), 210 mg (09/06/2018), 210 mg (09/13/2018) PACLitaxel (TAXOL) 90 mg in sodium chloride 0.9 % 250 mL chemo infusion (</= 80mg /m2), 45 mg/m2 = 90 mg, Intravenous,  Once, 4 of 5 cycles Administration: 90 mg (08/24/2018), 90 mg (08/30/2018), 90 mg (09/06/2018), 90 mg (09/13/2018)  for chemotherapy treatment.    10/27/2018 -  Chemotherapy   The patient had palonosetron (ALOXI) injection 0.25 mg, 0.25 mg, Intravenous,  Once, 4 of 4 cycles Administration: 0.25 mg (10/27/2018), 0.25 mg (12/08/2018), 0.25 mg (11/17/2018) PEMEtrexed (ALIMTA) 1,000 mg in sodium chloride 0.9 % 100 mL chemo infusion, 975 mg, Intravenous,  Once, 4  of 12 cycles Administration: 1,000 mg (10/27/2018), 1,000 mg (12/08/2018), 1,000 mg (01/19/2019), 1,000 mg (11/17/2018) CARBOplatin (PARAPLATIN) 520 mg in sodium chloride 0.9 % 250 mL chemo infusion, 520 mg (100 % of original dose 515.5 mg), Intravenous,  Once, 4 of 4 cycles Dose modification: 515.5 mg (original dose 515.5 mg, Cycle 1), 519.5 mg (original dose 519.5 mg, Cycle 3), 415.6 mg (original dose 519.5 mg, Cycle 3, Reason: Dose not tolerated), 348.4 mg (original dose 348.4 mg, Cycle 4), 271.6 mg (original dose 348.4 mg, Cycle 4, Reason: Provider Judgment), 519.5 mg (original dose 519.5 mg, Cycle 2) Administration: 520 mg (10/27/2018), 420 mg (12/08/2018), 270 mg (12/29/2018), 520 mg (11/17/2018) pembrolizumab (KEYTRUDA) 200 mg in sodium chloride 0.9 % 50 mL chemo infusion, 200 mg, Intravenous, Once, 5 of 13 cycles Administration: 200 mg (10/27/2018), 200 mg (12/08/2018), 200 mg (12/29/2018), 200 mg (01/19/2019), 200 mg (11/17/2018) fosaprepitant (EMEND) 150 mg, dexamethasone (DECADRON) 12 mg in sodium chloride 0.9 % 145 mL IVPB, , Intravenous,  Once, 4 of 4 cycles Administration:  (10/27/2018),  (12/08/2018),  (12/29/2018),  (11/17/2018)  for chemotherapy treatment.       REVIEW OF SYSTEMS:   Constitutional: Denies fevers, chills Eyes: Denies blurriness of vision Ears, nose, mouth, throat, and face: Denies mucositis or sore throat Respiratory: Denies cough, dyspnea or wheezes Cardiovascular: Reports right-sided chest discomfort with cough Gastrointestinal:  Denies nausea, heartburn or change in bowel habits Skin: Denies abnormal skin rashes Lymphatics: Denies new lymphadenopathy or easy bruising Neurological:Denies numbness, tingling or new weaknesses Behavioral/Psych: Mood is stable, no new changes  Extremities: No lower  extremity edema All other systems were reviewed with the patient and are negative.  I have reviewed the past medical history, past surgical history, social history and family history  with the patient and they are unchanged from previous note.   PHYSICAL EXAMINATION: ECOG PERFORMANCE STATUS: 1 - Symptomatic but completely ambulatory  Vitals:   01/31/19 1100 01/31/19 1128  BP: 124/86   Pulse:    Resp: 14   Temp:  (!) 97.4 F (36.3 C)  SpO2:     Filed Weights   01/29/19 0353 01/30/19 0500 01/31/19 0500  Weight: 163 lb 5.8 oz (74.1 kg) 162 lb 14.7 oz (73.9 kg) 166 lb 14.2 oz (75.7 kg)    Intake/Output from previous day: 09/27 0701 - 09/28 0700 In: 500 [IV Piggyback:500] Out: 3900 [Urine:3900]  GENERAL:alert, no distress and comfortable SKIN: skin color, texture, turgor are normal, no rashes or significant lesions EYES: normal, Conjunctiva are pink and non-injected, sclera clear OROPHARYNX:no exudate, no erythema and lips, buccal mucosa, and tongue normal  NECK: supple, thyroid normal size, non-tender, without nodularity LYMPH:  no palpable lymphadenopathy in the cervical, axillary or inguinal LUNGS: clear to auscultation and percussion with normal breathing effort HEART: regular rate & rhythm and no murmurs and trace lower extremity edema ABDOMEN:abdomen soft, non-tender and normal bowel sounds Musculoskeletal:no cyanosis of digits and no clubbing  NEURO: alert & oriented x 3 with fluent speech, no focal motor/sensory deficits  LABORATORY DATA:  I have reviewed the data as listed CMP Latest Ref Rng & Units 01/31/2019 01/30/2019 01/29/2019  Glucose 70 - 99 mg/dL 143(H) 149(H) 111(H)  BUN 8 - 23 mg/dL 28(H) 39(H) 50(H)  Creatinine 0.61 - 1.24 mg/dL 1.72(H) 2.28(H) 3.10(H)  Sodium 135 - 145 mmol/L 137 141 137  Potassium 3.5 - 5.1 mmol/L 4.0 5.6(H) 5.0  Chloride 98 - 111 mmol/L 109 113(H) 112(H)  CO2 22 - 32 mmol/L 15(L) 16(L) 15(L)  Calcium 8.9 - 10.3 mg/dL 8.5(L) 6.3(LL) 7.7(L)  Total Protein 6.5 - 8.1 g/dL 5.6(L) 5.6(L) 5.4(L)  Total Bilirubin 0.3 - 1.2 mg/dL 1.4(H) 1.4(H) 1.6(H)  Alkaline Phos 38 - 126 U/L 64 60 51  AST 15 - 41 U/L 40 25 23  ALT 0 -  44 U/L 22 15 14     Lab Results  Component Value Date   WBC 0.2 (LL) 01/31/2019   HGB 7.2 (L) 01/31/2019   HCT 21.9 (L) 01/31/2019   MCV 97.3 01/31/2019   PLT 16 (LL) 01/31/2019   NEUTROABS 0.0 (L) 01/30/2019    US Renal  Result Date: 02/02/2019 CLINICAL DATA:  Renal failure EXAM: RENAL / URINARY TRACT ULTRASOUND COMPLETE COMPARISON:  CT scan June 08, 2017 FINDINGS: Right Kidney: Renal measurements: 11.4 x 5.6 x 5.2 cm = volume: 173.7 mL . Echogenicity within normal limits. No mass or hydronephrosis visualized. Left Kidney: Renal measurements: 11.6 x 6.2 x 5.7 cm = volume: 213 mL. Contains a 1.7 cm cyst seen on previous CT imaging. Bladder: Appears normal for degree of bladder distention. IMPRESSION: 1. No cause for the patient's renal failure identified. A single left-sided renal cyst is noted, better assessed on previous CT imaging. Electronically Signed   By: Dorise Bullion III M.D   On: 01/24/2019 14:32   Dg Chest Port 1 View  Result Date: 01/27/2019 CLINICAL DATA:  Hypertension, lung cancer, diabetes mellitus, hypertension EXAM: PORTABLE CHEST 1 VIEW COMPARISON:  Portable exam 1343 hours compared to 12/09/2018 FINDINGS: Volume loss in the RIGHT hemithorax 6 with mild mediastinal shift from  LEFT to RIGHT. RIGHT jugular Port-A-Cath with tip projecting over RIGHT atrium. Normal heart size and pulmonary vascularity. Mild patchy RIGHT perihilar and RIGHT upper lobe infiltrates. LEFT lung clear. Elevation of RIGHT diaphragm. No pleural effusion or pneumothorax. IMPRESSION: Patchy RIGHT perihilar and RIGHT upper lobe infiltrates. LEFT lung clear. Electronically Signed   By: Lavonia Dana M.D.   On: 01/26/2019 14:20   Dg Abd Portable 1v  Result Date: 01/30/2019 CLINICAL DATA:  Abdominal pain and constipation. EXAM: PORTABLE ABDOMEN - 1 VIEW COMPARISON:  None. FINDINGS: The bowel gas pattern is nonobstructive. The amount of fecal material in the colon is within normal limits. No other acute  abnormalities identified. IMPRESSION: No evidence of constipation.  Nonobstructive bowel gas pattern. Electronically Signed   By: Dorise Bullion III M.D   On: 01/30/2019 15:56    ASSESSMENT AND PLAN: 1.  Stage IV non-small cell lung cancer, adenocarcinoma 2.  Pancytopenia secondary to recent chemotherapy and sepsis 3.  Pneumonia 4.  AKI on CKD 5.  Hypertension 6.  Diabetes  -We will start the patient on Granix 300 mcg daily for his neutropenia. -Transfuse packed red blood cells for hemoglobin less than 7 and transfuse platelets for platelet count less than 10,000 or active bleeding.  No transfusion indicated today. -Continue antibiotics per hospitalist for pneumonia. -Renal function improving with IV fluids.  Will need to reevaluate chemotherapy medications as an outpatient.  Alimta can cause renal insufficiency and we may need to discontinue this.  Will defer this discussion until next follow-up with medical oncology. -Continue metoprolol per hospitalist. -Insulin per hospitalist.   LOS: 3 days   Mikey Bussing, DNP, AGPCNP-BC, AOCNP 01/31/19

## 2019-01-31 NOTE — Progress Notes (Signed)
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 Last chemo: 01/19/2019 keytruda alimta carboplatin Patient presented with complaints of generalized fatigue poor p.o. intake. No nausea no vomiting. He also has some cough and shortness of breath. He has been taking Tussionex without any benefit. No diarrhea no constipation. With this the patient presented to cancer center with IV fluid appointment. His blood pressure was significantly low. Patient was transferred to emergency department further work-up. At the time of my evaluation denies any complaints of headache, nausea, vomiting. No shortness of breath no chest pain abdominal pain. Has some cough.  Currently further plan is continue antibiotics.  Assessment & Plan:   Principal Problem:   Sepsis due to pneumonia [facility]) Active Problems:   Tobacco abuse disorder   Cancer associated pain   Osseous metastasis (HCC)   Adenocarcinoma of right lung, stage 4 ([place])   Port-A-Cath in place   Thrombocytopenia ([place])   Insulin dependent diabetes mellitus (HCC)   Hyperkalemia   Metabolic acidosis   AKI (acute kidney injury) ([place])   Neutropenia with fever (HCC)   Anemia associated with chemotherapy   Pressure injury of skin  1.Sepsis due to pneumonia [facility]) -Presented hypotensive, tachycardic, has pancytopenia and chest x-ray showing increased infiltrate on the right side. -MRSA PCR negative, blood culture is growing gram-positive rods likely contaminant.   -Urine culture is negative. -Initially treated with IV vancomycin cefepime and Flagyl, now narrowed to cefepime monotherapy -BP currently stable, with continued mild  tachycardia  2.Pancytopenia. 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 -EDP discussed with oncology at time of presentation -Oncology following -Plan to give dose of granix today -Plts of 16k this AM. Recommendation to transfuse if bleeding or if plts<10k -On neutropenic precautions.  3.Acute kidney injury on chronic kidney disease. Poor p.o. intake. Metabolic acidosis Hyperkalemia -Ultrasound renal negative for hydronephrosis. -Mildly hyperkalemic, improved with Lokelma -We will start the patient on Lokelma.  4.Thrombocytopenia (HCC) Severe. -Likely secondary to chemotherapy. -S/P 1 transfusion on 01/29/2019 -Plts today 16k, plan to transfuse if bleeding or if plts<10k  5Insulin dependent diabetes mellitus ([place]) with renal complication Controlled -6.3hemoglobin A1c. -Holding long-acting Toujeo. -Continue on SSI coverage for now  6.Pain control. Chest pain chronic cancer related Continue home regimen.  7.  Anemia secondary to chemotherapy. -Patient received 1 PRBC transfusion on 01/27/2019. -No active bleeding at this time. -Repeat CBC in AM  8.  Essential hypertension. Sinus tachycardia. -Blood pressure improving as the patient initially presented with hypotension with BP meds on hold -Pt currently continued on 25 mg Lopressor twice daily with holding parameters. -Continuing to hold ramipril and Norvasc.  9.Pressure ulcer. Sacral. Stage II. POA.  -Continue foam dressing.  Pressure Injury 01/25/2019 Sacrum Posterior;Mid Stage II -  Partial thickness loss of dermis presenting as a shallow open ulcer with a red, pink wound bed without slough. (Active)  01/25/2019 2000  Location: Sacrum  Location Orientation: Posterior;Mid  Staging: Stage II -  Partial thickness loss of dermis presenting as a shallow open ulcer with a red, pink wound bed without slough.  Wound Description (Comments):     Present on Admission:     DVT prophylaxis: SCD's Code Status: Full Family Communication: Pt in room, family not  at bedside Disposition Plan: Uncertain at this time  Consultants:   Oncology  Procedures:     Antimicrobials: Anti-infectives (From admission, onward)   Start     Dose/Rate Route Frequency Ordered Stop   01/31/19 1200  ceFEPIme (MAXIPIME) 2 g in sodium chloride 0.9 % 100 mL IVPB     2 g 200 mL/hr over 30 Minutes Intravenous Every 12 hours 01/31/19 1058     01/30/19 1400  vancomycin (VANCOCIN) IVPB 1000 mg/200 mL premix  Status:  Discontinued     1,000 mg 200 mL/hr over 60 Minutes Intravenous Every 48 hours 01/19/2019 1935 01/29/19 1222   01/29/19 1600  ceFEPIme (MAXIPIME) 2 g in sodium chloride 0.9 % 100 mL IVPB  Status:  Discontinued     2 g 200 mL/hr over 30 Minutes Intravenous Every 24 hours 01/19/2019 1932 01/31/19 1058   01/24/2019 2000  azithromycin (ZITHROMAX) 500 mg in sodium chloride 0.9 % 250 mL IVPB     500 mg 250 mL/hr over 60 Minutes Intravenous Every 24 hours 01/27/2019 1928     01/09/2019 1500  ceFEPIme (MAXIPIME) 2 g in sodium chloride 0.9 % 100 mL IVPB     2 g 200 mL/hr over 30 Minutes Intravenous STAT 01/11/2019 1446 01/23/2019 1910   01/20/2019 1500  vancomycin (VANCOCIN) 1,500 mg in sodium chloride 0.9 % 500 mL IVPB     1,500 mg 250 mL/hr over 120 Minutes Intravenous STAT 01/24/2019 1446 01/27/2019 1719       Subjective: Without complaints at this time  Objective: Vitals:   01/31/19 1128 01/31/19 1200 01/31/19 1300 01/31/19 1400  BP:  102/77 110/79 (!) 124/91  Pulse:      Resp:  '10 12 12  ' Temp: (!) 97.4 F (36.3 C)     TempSrc: Oral     SpO2:      Weight:        Intake/Output Summary (Last 24 hours) at 01/31/2019 1646 Last data filed at 01/31/2019 1418 Gross per 24 hour  Intake 1253.45 ml  Output 3600 ml  Net -2346.55 ml   Filed Weights   01/29/19 0353 01/30/19 0500 01/31/19 0500  Weight: 74.1 kg 73.9 kg 75.7 kg    Examination:  General  exam: Appears calm and comfortable  Respiratory system: Clear to auscultation. Respiratory effort normal. Cardiovascular system: S1 & S2 heard, RRR Gastrointestinal system: Abdomen is nondistended, soft and nontender. No organomegaly or masses felt. Normal bowel sounds heard. Central nervous system: Alert and oriented. No focal neurological deficits. Extremities: Symmetric 5 x 5 power. Skin: No rashes, lesions Psychiatry: Judgement and insight appear normal. Mood & affect appropriate.   Data Reviewed: I have personally reviewed following labs and imaging studies  CBC: Recent Labs  Lab 01/25/19 1142 01/15/2019 1140 01/26/2019 1305 01/29/19 0330 01/30/19 0500 01/31/19 0645  WBC 5.1 0.4* 0.3* 0.2* 0.2* 0.2*  NEUTROABS 4.4 0.2* 0.2*  --  0.0*  --   HGB 9.7* 7.7* 7.4* 8.0* 7.6* 7.2*  HCT 29.1* 24.3* 22.9* 23.8* 23.1* 21.9*  MCV 97.7 101.3* 102.7* 97.1 96.7 97.3  PLT 61* 14* 12* 7* 30* 16*   Basic Metabolic Panel: Recent Labs  Lab 01/20/2019 1140 01/27/2019 1305 01/29/19 0330 01/30/19 0500 01/31/19 0645  NA 135 134* 137 141 137  K 5.4* 5.3* 5.0 5.6* 4.0  CL 105 107 112* 113* 109  CO2 15* 15* 15* 16* 15*  GLUCOSE 172* 170* 111* 149* 143*  BUN 50* 54* 50* 39* 28*  CREATININE 3.50* 3.66* 3.10*  2.28* 1.72*  CALCIUM 8.7* 8.1* 7.7* 6.3* 8.5*  MG  --   --   --  0.9* 2.2   GFR: Estimated Creatinine Clearance: 41.1 mL/min (A) (by C-G formula based on SCr of 1.72 mg/dL (H)). Liver Function Tests: Recent Labs  Lab 01/27/2019 1140 01/06/2019 1305 01/29/19 0330 01/30/19 0500 01/31/19 0645  AST '18 20 23 25 ' 40  ALT '9 15 14 15 22  ' ALKPHOS 73 58 51 60 64  BILITOT 1.0 1.3* 1.6* 1.4* 1.4*  PROT 6.5 5.9* 5.4* 5.6* 5.6*  ALBUMIN 2.3* 2.4* 2.2* 2.1* 2.1*   No results for input(s): LIPASE, AMYLASE in the last 168 hours. No results for input(s): AMMONIA in the last 168 hours. Coagulation Profile: No results for input(s): INR, PROTIME in the last 168 hours. Cardiac Enzymes: No results for  input(s): CKTOTAL, CKMB, CKMBINDEX, TROPONINI in the last 168 hours. BNP (last 3 results) No results for input(s): PROBNP in the last 8760 hours. HbA1C: Recent Labs    01/29/19 0330  HGBA1C 6.3*   CBG: Recent Labs  Lab 01/30/19 1528 01/30/19 2104 01/31/19 0724 01/31/19 1127 01/31/19 1622  GLUCAP 156* 141* 132* 137* 85   Lipid Profile: No results for input(s): CHOL, HDL, LDLCALC, TRIG, CHOLHDL, LDLDIRECT in the last 72 hours. Thyroid Function Tests: No results for input(s): TSH, T4TOTAL, FREET4, T3FREE, THYROIDAB in the last 72 hours. Anemia Panel: No results for input(s): VITAMINB12, FOLATE, FERRITIN, TIBC, IRON, RETICCTPCT in the last 72 hours. Sepsis Labs: Recent Labs  Lab 01/10/2019 1305 01/05/2019 1527 01/30/19 0500  LATICACIDVEN 2.1* 3.6* 1.4    Recent Results (from the past 240 hour(s))  SARS Coronavirus 2 [facility] order, Performed in [facility] hospital lab) Nasopharyngeal Nasopharyngeal Swab     Status: None   Collection Time: 01/26/2019  1:06 PM   Specimen: Nasopharyngeal Swab  Result Value Ref Range Status   SARS Coronavirus 2 NEGATIVE NEGATIVE Final    Comment: (NOTE) If result is NEGATIVE SARS-CoV-2 target nucleic acids are NOT DETECTED. The SARS-CoV-2 RNA is generally detectable in upper and lower  respiratory specimens during the acute phase of infection. The lowest  concentration of SARS-CoV-2 viral copies this assay can detect is 250  copies / mL. A negative result does not preclude SARS-CoV-2 infection  and should not be used as the sole basis for treatment or other  patient management decisions.  A negative result may occur with  improper specimen collection / handling, submission of specimen other  than nasopharyngeal swab, presence of viral mutation(s) within the  areas targeted by this assay, and inadequate number of viral copies  (<250 copies / mL). A negative result must be combined with clinical  observations, patient history, and  epidemiological information. If result is POSITIVE SARS-CoV-2 target nucleic acids are DETECTED. The SARS-CoV-2 RNA is generally detectable in upper and lower  respiratory specimens dur ing the acute phase of infection.  Positive  results are indicative of active infection with SARS-CoV-2.  Clinical  correlation with patient history and other diagnostic information is  necessary to determine patient infection status.  Positive results do  not rule out bacterial infection or co-infection with other viruses. If result is PRESUMPTIVE POSTIVE SARS-CoV-2 nucleic acids MAY BE PRESENT.   A presumptive positive result was obtained on the submitted specimen  and confirmed on repeat testing.  While 2019 novel coronavirus  (SARS-CoV-2) nucleic acids may be present in the submitted sample  additional confirmatory testing may be necessary for epidemiological  and /  or clinical management purposes  to differentiate between  SARS-CoV-2 and other Sarbecovirus currently known to infect humans.  If clinically indicated additional testing with an alternate test  methodology [phone]) is advised. The SARS-CoV-2 RNA is generally  detectable in upper and lower respiratory sp ecimens during the acute  phase of infection. The expected result is Negative. Fact Sheet for Patients:  StrictlyIdeas.no Fact Sheet for Healthcare Providers: BankingDealers.co.za This test is not yet approved or cleared by the [place] FDA and has been authorized for detection and/or diagnosis of SARS-CoV-2 by FDA under an Emergency Use Authorization (EUA).  This EUA will remain in effect (meaning this test can be used) for the duration of the COVID-19 declaration under Section 564(b)(1) of the Act, 21 U.S.C. section 360bbb-3(b)(1), unless the authorization is terminated or revoked sooner. Performed at [facility], [place] [address], [place], [place] [address]     Blood culture (routine x 2)     Status: Abnormal   Collection Time: 01/27/2019  1:37 PM   Specimen: BLOOD  Result Value Ref Range Status   Specimen Description   Final    BLOOD PORTA CATH Performed at [facility] [address], [place], [place] [address]    Special Requests   Final    BOTTLES DRAWN AEROBIC AND ANAEROBIC Blood Culture adequate volume Performed at [facility] [address] [place], [place] [address]    Culture  Setup Time   Final    GRAM POSITIVE COCCI AEROBIC BOTTLE ONLY CRITICAL RESULT CALLED TO, READ BACK BY AND VERIFIED WITH: PHARMD M SWAAYNE [id number] AT 1217 BY CM GRAM POSITIVE RODS ANAEROBIC BOTTLE ONLY CRITICAL RESULT CALLED TO, READ BACK BY AND VERIFIED WITH: M. Erskin Burnet [id number] 01/30/2019 T. TYSOR    Culture (A)  Final    STAPHYLOCOCCUS SPECIES (COAGULASE NEGATIVE) THE SIGNIFICANCE OF ISOLATING THIS ORGANISM FROM A SINGLE VENIPUNCTURE CANNOT BE PREDICTED WITHOUT FURTHER CLINICAL AND CULTURE CORRELATION. SUSCEPTIBILITIES AVAILABLE ONLY ON REQUEST. Performed at [facility], [place] [address], [place], [place] [address]    Report Status 01/31/2019 FINAL  Final  Blood Culture ID Panel (Reflexed)     Status: Abnormal   Collection Time: 02/01/2019  1:37 PM  Result Value Ref Range Status   Enterococcus species NOT DETECTED NOT DETECTED Final   Listeria monocytogenes NOT DETECTED NOT DETECTED Final   Staphylococcus species DETECTED (A) NOT DETECTED Final    Comment: Methicillin (oxacillin) susceptible coagulase negative staphylococcus. Possible blood culture contaminant (unless isolated from more than one blood culture draw or clinical case suggests pathogenicity). No antibiotic treatment is indicated for blood  culture contaminants. CRITICAL RESULT CALLED TO, READ BACK BY AND VERIFIED WITH: PHARMD Shelda Jakes [id number] AT 1217 BY CM    Staphylococcus aureus (BCID) NOT DETECTED NOT DETECTED Final   Methicillin resistance  NOT DETECTED NOT DETECTED Final   Streptococcus species NOT DETECTED NOT DETECTED Final   Streptococcus agalactiae NOT DETECTED NOT DETECTED Final   Streptococcus pneumoniae NOT DETECTED NOT DETECTED Final   Streptococcus pyogenes NOT DETECTED NOT DETECTED Final   Acinetobacter baumannii NOT DETECTED NOT DETECTED Final   Enterobacteriaceae species NOT DETECTED NOT DETECTED Final   Enterobacter cloacae complex NOT DETECTED NOT DETECTED Final   Escherichia coli NOT DETECTED NOT DETECTED Final   Klebsiella oxytoca NOT DETECTED NOT DETECTED Final   Klebsiella pneumoniae NOT DETECTED NOT DETECTED Final   Proteus species NOT DETECTED NOT DETECTED Final   Serratia marcescens NOT DETECTED  NOT DETECTED Final   Haemophilus influenzae NOT DETECTED NOT DETECTED Final   Neisseria meningitidis NOT DETECTED NOT DETECTED Final   Pseudomonas aeruginosa NOT DETECTED NOT DETECTED Final   Candida albicans NOT DETECTED NOT DETECTED Final   Candida glabrata NOT DETECTED NOT DETECTED Final   Candida krusei NOT DETECTED NOT DETECTED Final   Candida parapsilosis NOT DETECTED NOT DETECTED Final   Candida tropicalis NOT DETECTED NOT DETECTED Final    Comment: Performed at [facility], [place] [address] [place], [place] [address]  Urine culture     Status: None   Collection Time: 01/17/2019  6:00 PM   Specimen: Urine, Clean Catch  Result Value Ref Range Status   Specimen Description   Final    URINE, CLEAN CATCH Performed at [facility], [place] [address] [place], [place] [address]    Special Requests   Final    NONE Performed at [facility], [place] [address], [place], [place] [address]    Culture   Final    NO GROWTH Performed at [facility], [place] [address], [place], [place] [address]    Report Status 01/30/2019 FINAL  Final  MRSA PCR Screening     Status: None   Collection Time: 01/09/2019  7:39 PM   Specimen: Nasopharyngeal  Result Value Ref Range Status     MRSA by PCR NEGATIVE NEGATIVE Final    Comment:        The GeneXpert MRSA Assay (FDA approved for NASAL specimens only), is one component of a comprehensive MRSA colonization surveillance program. It is not intended to diagnose MRSA infection nor to guide or monitor treatment for MRSA infections. Performed at [facility], [place] [address], [place], [place] [address]      Radiology Studies: Dg Abd Portable 1v  Result Date: 01/30/2019 CLINICAL DATA:  Abdominal pain and constipation. EXAM: PORTABLE ABDOMEN - 1 VIEW COMPARISON:  None. FINDINGS: The bowel gas pattern is nonobstructive. The amount of fecal material in the colon is within normal limits. No other acute abnormalities identified. IMPRESSION: No evidence of constipation.  Nonobstructive bowel gas pattern. Electronically Signed   By: Dorise Bullion III M.D   On: 01/30/2019 15:56    Scheduled Meds:  Chlorhexidine Gluconate Cloth  6 each Topical Daily   chlorproMAZINE  25 mg Oral TID   docusate sodium  100 mg Oral BID   feeding supplement (ENSURE ENLIVE)  237 mL Oral BID BM   insulin aspart  0-5 Units Subcutaneous QHS   insulin aspart  0-9 Units Subcutaneous TID WC   insulin aspart  3 Units Subcutaneous TID WC   levothyroxine  100 mcg Oral Daily   metoprolol tartrate  25 mg Oral BID   mirtazapine  7.5 mg Oral QHS   pantoprazole  40 mg Oral BID   sodium zirconium cyclosilicate  10 g Oral Q lunch   [START ON 02/01/2019] Tbo-filgastrim (GRANIX) SQ  300 mcg Subcutaneous q1800   Continuous Infusions:  azithromycin Stopped (01/30/19 2127)   ceFEPime (MAXIPIME) IV Stopped (01/31/19 1238)     LOS: 3 days   Marylu Lund, MD [facility] Pager On Amion  If 7PM-7AM, please contact night-coverage 01/31/2019, 4:46 PM

## 2019-01-31 NOTE — Progress Notes (Signed)
Physical Therapy Treatment Patient Details Name: INAKI VANTINE MRN: [id number] DOB: January 24, 1951 Today's Date: 01/31/2019    History of Present Illness 68 yo male admitted with hypotesion, weakness, ARF, sepsis. Hx of met lung ca, DM, falls.    PT Comments    Bed exercises only on today. Pt reported he had just returned to bed because he was having some chest tightness. He did not feel up to getting up again. He did agree to LE exercises. HR up to 122 bpm during session. Will continue to follow and progress activity as tolerated.     Follow Up Recommendations  Home health PT;Supervision/Assistance - 24 hour     Equipment Recommendations  Rolling walker with 5" wheels    Recommendations for Other Services       Precautions / Restrictions Precautions Precautions: Fall Restrictions Weight Bearing Restrictions: No    Mobility  Bed Mobility               General bed mobility comments: NT-pt declined OOB. He reported he had just returned to bed and that he was having some chest tightness.  Transfers                    Ambulation/Gait                 Stairs             Wheelchair Mobility    Modified Rankin (Stroke Patients Only)       Balance                                            Cognition Arousal/Alertness: Awake/alert Behavior During Therapy: WFL for tasks assessed/performed Overall Cognitive Status: Within Functional Limits for tasks assessed                                        Exercises General Exercises - Lower Extremity Ankle Circles/Pumps: AROM;Both;20 reps;Supine Quad Sets: AROM;Both;10 reps;Supine Heel Slides: AROM;Right;Left;10 reps;Supine Straight Leg Raises: AROM;Right;Left;10 reps;Supine    General Comments        Pertinent Vitals/Pain Pain Assessment: Faces Faces Pain Scale: Hurts a little bit Pain Location: chest Pain Descriptors / Indicators: Tightness Pain  Intervention(s): Limited activity within patient's tolerance;Monitored during session    Home Living                      Prior Function            PT Goals (current goals can now be found in the care plan section) Progress towards PT goals: Progressing toward goals    Frequency    Min 3X/week      PT Plan Current plan remains appropriate    Co-evaluation              AM-PAC PT "6 Clicks" Mobility   Outcome Measure  Help needed turning from your back to your side while in a flat bed without using bedrails?: A Little Help needed moving from lying on your back to sitting on the side of a flat bed without using bedrails?: A Little Help needed moving to and from a bed to a chair (including a wheelchair)?: A Little Help needed standing up from a chair using your arms (e.g.,  wheelchair or bedside chair)?: A Little Help needed to walk in hospital room?: A Little Help needed climbing 3-5 steps with a railing? : A Lot 6 Click Score: 17    End of Session   Activity Tolerance: Patient tolerated treatment well Patient left: in bed;with call bell/phone within reach;with bed alarm set   PT Visit Diagnosis: Unsteadiness on feet (R26.81);History of falling (Z91.81);Repeated falls (R29.6);Difficulty in walking, not elsewhere classified (R26.2);Muscle weakness (generalized) (M62.81)     Time: 1052-1100 PT Time Calculation (min) (ACUTE ONLY): 8 min  Charges:  $Therapeutic Exercise: 8-22 mins                       Weston Anna, PT Acute Rehabilitation Services Pager: [phone] Office: [phone]

## 2019-02-01 DIAGNOSIS — A419 Sepsis, unspecified organism: Principal | ICD-10-CM

## 2019-02-01 DIAGNOSIS — J189 Pneumonia, unspecified organism: Secondary | ICD-10-CM

## 2019-02-01 LAB — COMPREHENSIVE METABOLIC PANEL
ALT: 46 U/L — ABNORMAL HIGH (ref 0–44)
AST: 94 U/L — ABNORMAL HIGH (ref 15–41)
Albumin: 2 g/dL — ABNORMAL LOW (ref 3.5–5.0)
Alkaline Phosphatase: 65 U/L (ref 38–126)
Anion gap: 14 (ref 5–15)
BUN: 25 mg/dL — ABNORMAL HIGH (ref 8–23)
CO2: 15 mmol/L — ABNORMAL LOW (ref 22–32)
Calcium: 8.5 mg/dL — ABNORMAL LOW (ref 8.9–10.3)
Chloride: 110 mmol/L (ref 98–111)
Creatinine, Ser: 1.66 mg/dL — ABNORMAL HIGH (ref 0.61–1.24)
GFR calc Af Amer: 48 mL/min — ABNORMAL LOW (ref >60)
GFR calc non Af Amer: 42 mL/min — ABNORMAL LOW (ref >60)
Glucose, Bld: 113 mg/dL — ABNORMAL HIGH (ref 70–99)
Potassium: 3.8 mmol/L (ref 3.5–5.1)
Sodium: 139 mmol/L (ref 135–145)
Total Bilirubin: 2 mg/dL — ABNORMAL HIGH (ref 0.3–1.2)
Total Protein: 5.7 g/dL — ABNORMAL LOW (ref 6.5–8.1)

## 2019-02-01 LAB — GLUCOSE, CAPILLARY
Glucose-Capillary: 109 mg/dL — ABNORMAL HIGH (ref 70–99)
Glucose-Capillary: 80 mg/dL (ref 70–99)
Glucose-Capillary: 96 mg/dL (ref 70–99)
Glucose-Capillary: 95 mg/dL (ref 70–99)

## 2019-02-01 LAB — CBC
HCT: 22.1 % — ABNORMAL LOW (ref 39.0–52.0)
Hemoglobin: 7.4 g/dL — ABNORMAL LOW (ref 13.0–17.0)
MCH: 31.8 pg (ref 26.0–34.0)
MCHC: 33.5 g/dL (ref 30.0–36.0)
MCV: 94.8 fL (ref 80.0–100.0)
Platelets: 11 10*3/uL — CL (ref 150–400)
RBC: 2.33 MIL/uL — ABNORMAL LOW (ref 4.22–5.81)
RDW: 16.6 % — ABNORMAL HIGH (ref 11.5–15.5)
WBC: 0.2 10*3/uL — CL (ref 4.0–10.5)
nRBC: 0 % (ref 0.0–0.2)

## 2019-02-01 LAB — DIFFERENTIAL

## 2019-02-01 MED ORDER — DIPHENHYDRAMINE HCL 25 MG PO CAPS
25.0000 mg | ORAL_CAPSULE | Freq: Once | ORAL | Status: AC
  Administered 2019-02-01: 10:00:00 25 mg via ORAL
  Filled 2019-02-01: qty 1

## 2019-02-01 MED ORDER — ACETAMINOPHEN 325 MG PO TABS
650.0000 mg | ORAL_TABLET | Freq: Once | ORAL | Status: AC
  Administered 2019-02-01: 10:00:00 650 mg via ORAL
  Filled 2019-02-01: qty 2

## 2019-02-01 MED ORDER — METOPROLOL TARTRATE 25 MG PO TABS
50.0000 mg | ORAL_TABLET | Freq: Two times a day (BID) | ORAL | Status: DC
  Administered 2019-02-01 – 2019-02-04 (×8): 50 mg via ORAL
  Filled 2019-02-01 (×2): qty 1
  Filled 2019-02-01: qty 2
  Filled 2019-02-01 (×5): qty 1

## 2019-02-01 MED ORDER — SODIUM CHLORIDE 0.9% IV SOLUTION
Freq: Once | INTRAVENOUS | Status: AC
  Administered 2019-02-01: 10 mL/h via INTRAVENOUS

## 2019-02-01 NOTE — Evaluation (Signed)
OT Cancellation Note  Patient Details Name: Zachary Knox MRN: [id number] DOB: 1951/01/22   Cancelled Treatment:    Reason Eval/Treat Not Completed: Other (comment) Pt declined OT, very drowsy and difficulty keeping eyes open. Offered ADL and mobility, pt declining. States he is waiting for his daughter then falling back asleep. Will continue with OT POC as available and appropriate.  Zenovia Jarred, MSOT, OTR/L Behavioral Health OT/ Acute Relief OT [facility] Office: [phone]  Zenovia Jarred 02/01/2019, 4:15 PM

## 2019-02-01 NOTE — Plan of Care (Signed)
Continue current POC 

## 2019-02-01 NOTE — Progress Notes (Signed)
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 Last chemo: 01/19/2019 keytruda alimta carboplatin Patient presented with complaints of generalized fatigue poor p.o. intake. No nausea no vomiting. He also has some cough and shortness of breath. He has been taking Tussionex without any benefit. No diarrhea no constipation. With this the patient presented to cancer center with IV fluid appointment. His blood pressure was significantly low. Patient was transferred to emergency department further work-up. At the time of my evaluation denies any complaints of headache, nausea, vomiting. No shortness of breath no chest pain abdominal pain. Has some cough.  Currently further plan is continue antibiotics.  Assessment & Plan:   Principal Problem:   Sepsis due to pneumonia [facility] Active Problems:   Tobacco abuse disorder   Cancer associated pain   Osseous metastasis (HCC)   Adenocarcinoma of right lung, stage 4 ([place])   Port-A-Cath in place   Thrombocytopenia ([place])   Insulin dependent diabetes mellitus (HCC)   Hyperkalemia   Metabolic acidosis   AKI (acute kidney injury) ([place])   Neutropenia with fever (HCC)   Anemia associated with chemotherapy   Pressure injury of skin  1.Sepsis due to pneumonia [facility]) -Presented hypotensive, tachycardic, has pancytopenia and chest x-ray showing increased infiltrate on the right side. -MRSA PCR negative, blood culture is growing gram-positive rods likely contaminant.   -Urine culture is negative. -Initially treated with IV vancomycin cefepime and Flagyl, now narrowed to cefepime monotherapy -BP remains stable. Pt had been tachycardic, HR  seems to be improving  2.Pancytopenia. 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 -EDP discussed with oncology at time of presentation -Remains neutropenic -Per Oncology, plan for daily Granix -Plts of 11k this AM. One pack of plts ordered per Oncology -Pt is continued on neutropenic precautions  3.Acute kidney injury on chronic kidney disease. Poor p.o. intake. Metabolic acidosis Hyperkalemia -Ultrasound renal negative for hydronephrosis. -Earlier noted to be hyperkalemic, improved with Lokelma -Will repeat bmet in AM  4.Thrombocytopenia (HCC) Severe. -Likely secondary to chemotherapy. -S/P 1 transfusion on 01/29/2019 -Plts of 11k this AM, one pack of plts transfused per Oncology -Repeat CBC in AM  5Insulin dependent diabetes mellitus ([place]) with renal complication Controlled -6.3hemoglobin A1c. -Holding long-acting Toujeo. -Glucose trends appear stable. Continue on SSI coverage as needed  6.Pain control. Chest pain chronic cancer related -Continue home regimen. -Seems stable at this time  7.  Anemia secondary to chemotherapy. -Patient received 1 PRBC transfusion on 01/27/2019. -No active bleeding at this time, hgb remains stable -Recheck CBC in AM  8.  Essential hypertension. Sinus tachycardia. -ramipril and Norvasc currently on hold -Pt had been on 61m metoprolol BID, however remained tachycardic, thus dose increased to 557mBID. -BP stable and nearly controlled. HR now better controlled  9.Pressure ulcer. Sacral. Stage II. POA.  -Continue foam dressing.  Pressure Injury 01/16/2019 Sacrum Posterior;Mid Stage II -  Partial thickness loss of dermis presenting as a shallow open ulcer with a red, pink wound bed without slough. (Active)  01/20/2019 2000  Location: Sacrum  Location Orientation: Posterior;Mid  Staging: Stage II -  Partial thickness loss of dermis presenting as a shallow  open ulcer with a red, pink wound bed without slough.  Wound Description (Comments):  Present on Admission:     DVT prophylaxis: SCD's Code Status: Full Family Communication: Pt in room, family not at bedside Disposition Plan: Home health PT when cleared by Oncology and when no longer neutropenic  Consultants:   Oncology  Procedures:     Antimicrobials: Anti-infectives (From admission, onward)   Start     Dose/Rate Route Frequency Ordered Stop   01/31/19 1200  ceFEPIme (MAXIPIME) 2 g in sodium chloride 0.9 % 100 mL IVPB     2 g 200 mL/hr over 30 Minutes Intravenous Every 12 hours 01/31/19 1058     01/30/19 1400  vancomycin (VANCOCIN) IVPB 1000 mg/200 mL premix  Status:  Discontinued     1,000 mg 200 mL/hr over 60 Minutes Intravenous Every 48 hours 01/14/2019 1935 01/29/19 1222   01/29/19 1600  ceFEPIme (MAXIPIME) 2 g in sodium chloride 0.9 % 100 mL IVPB  Status:  Discontinued     2 g 200 mL/hr over 30 Minutes Intravenous Every 24 hours 01/23/2019 1932 01/31/19 1058   01/26/2019 2000  azithromycin (ZITHROMAX) 500 mg in sodium chloride 0.9 % 250 mL IVPB     500 mg 250 mL/hr over 60 Minutes Intravenous Every 24 hours 01/24/2019 1928     01/15/2019 1500  ceFEPIme (MAXIPIME) 2 g in sodium chloride 0.9 % 100 mL IVPB     2 g 200 mL/hr over 30 Minutes Intravenous STAT 01/19/2019 1446 01/25/2019 1910   01/31/2019 1500  vancomycin (VANCOCIN) 1,500 mg in sodium chloride 0.9 % 500 mL IVPB     1,500 mg 250 mL/hr over 120 Minutes Intravenous STAT 01/09/2019 1446 01/04/2019 1719      Subjective: No complaints this AM. Reports feeling better  Objective: Vitals:   02/01/19 0425 02/01/19 0945 02/01/19 1010 02/01/19 1200  BP: (!) 131/92 132/87 138/77 120/90  Pulse: (!) 120  (!) 105 90  Resp: '18 18 20 18  ' Temp: (!) 97.5 F (36.4 C) (!) 97.1 F (36.2 C) (!) 97.5 F (36.4 C) (!) 97.2 F (36.2 C)  TempSrc: Oral Oral  Oral  SpO2: 100% 100%    Weight: 72.1 kg     Height:        Intake/Output  Summary (Last 24 hours) at 02/01/2019 1505 Last data filed at 02/01/2019 0955 Gross per 24 hour  Intake 615 ml  Output 3150 ml  Net -2535 ml   Filed Weights   01/31/19 0500 01/31/19 2240 02/01/19 0425  Weight: 75.7 kg 72 kg 72.1 kg    Examination: General exam: Awake, laying in bed, in nad Respiratory system: Normal respiratory effort, no wheezing Cardiovascular system: regular rate, s1, s2 Gastrointestinal system: Soft, nondistended, positive BS Central nervous system: CN2-12 grossly intact, strength intact Extremities: Perfused, no clubbing Skin: Normal skin turgor, no notable skin lesions seen Psychiatry: Mood normal // no visual hallucinations   Data Reviewed: I have personally reviewed following labs and imaging studies  CBC: Recent Labs  Lab 02/02/2019 1140 01/25/2019 1305 01/29/19 0330 01/30/19 0500 01/31/19 0645 02/01/19 0404  WBC 0.4* 0.3* 0.2* 0.2* 0.2* 0.2*  NEUTROABS 0.2* 0.2*  --  0.0*  --   --   HGB 7.7* 7.4* 8.0* 7.6* 7.2* 7.4*  HCT 24.3* 22.9* 23.8* 23.1* 21.9* 22.1*  MCV 101.3* 102.7* 97.1 96.7 97.3 94.8  PLT 14* 12* 7* 30* 16* 11*   Basic Metabolic Panel: Recent Labs  Lab 01/04/2019 1305 01/29/19 0330 01/30/19 0500 01/31/19 0645 02/01/19 0404  NA 134* 137 141 137 139  K 5.3*  5.0 5.6* 4.0 3.8  CL 107 112* 113* 109 110  CO2 15* 15* 16* 15* 15*  GLUCOSE 170* 111* 149* 143* 113*  BUN 54* 50* 39* 28* 25*  CREATININE 3.66* 3.10* 2.28* 1.72* 1.66*  CALCIUM 8.1* 7.7* 6.3* 8.5* 8.5*  MG  --   --  0.9* 2.2  --    GFR: Estimated Creatinine Clearance: 42.6 mL/min (A) (by C-G formula based on SCr of 1.66 mg/dL (H)). Liver Function Tests: Recent Labs  Lab 01/16/2019 1305 01/29/19 0330 01/30/19 0500 01/31/19 0645 02/01/19 0404  AST '20 23 25 ' 40 94*  ALT '15 14 15 22 ' 46*  ALKPHOS 58 51 60 64 65  BILITOT 1.3* 1.6* 1.4* 1.4* 2.0*  PROT 5.9* 5.4* 5.6* 5.6* 5.7*  ALBUMIN 2.4* 2.2* 2.1* 2.1* 2.0*   No results for input(s): LIPASE, AMYLASE in the last 168  hours. No results for input(s): AMMONIA in the last 168 hours. Coagulation Profile: No results for input(s): INR, PROTIME in the last 168 hours. Cardiac Enzymes: No results for input(s): CKTOTAL, CKMB, CKMBINDEX, TROPONINI in the last 168 hours. BNP (last 3 results) No results for input(s): PROBNP in the last 8760 hours. HbA1C: No results for input(s): HGBA1C in the last 72 hours. CBG: Recent Labs  Lab 01/31/19 1127 01/31/19 1622 01/31/19 2110 02/01/19 0717 02/01/19 1102  GLUCAP 137* 85 82 109* 80   Lipid Profile: No results for input(s): CHOL, HDL, LDLCALC, TRIG, CHOLHDL, LDLDIRECT in the last 72 hours. Thyroid Function Tests: No results for input(s): TSH, T4TOTAL, FREET4, T3FREE, THYROIDAB in the last 72 hours. Anemia Panel: No results for input(s): VITAMINB12, FOLATE, FERRITIN, TIBC, IRON, RETICCTPCT in the last 72 hours. Sepsis Labs: Recent Labs  Lab 01/19/2019 1305 01/04/2019 1527 01/30/19 0500  LATICACIDVEN 2.1* 3.6* 1.4    Recent Results (from the past 240 hour(s))  SARS Coronavirus 2 [facility] order, Performed in [facility] hospital lab) Nasopharyngeal Nasopharyngeal Swab     Status: None   Collection Time: 01/23/2019  1:06 PM   Specimen: Nasopharyngeal Swab  Result Value Ref Range Status   SARS Coronavirus 2 NEGATIVE NEGATIVE Final    Comment: (NOTE) If result is NEGATIVE SARS-CoV-2 target nucleic acids are NOT DETECTED. The SARS-CoV-2 RNA is generally detectable in upper and lower  respiratory specimens during the acute phase of infection. The lowest  concentration of SARS-CoV-2 viral copies this assay can detect is 250  copies / mL. A negative result does not preclude SARS-CoV-2 infection  and should not be used as the sole basis for treatment or other  patient management decisions.  A negative result may occur with  improper specimen collection / handling, submission of specimen other  than nasopharyngeal swab, presence of viral mutation(s) within the  areas  targeted by this assay, and inadequate number of viral copies  (<250 copies / mL). A negative result must be combined with clinical  observations, patient history, and epidemiological information. If result is POSITIVE SARS-CoV-2 target nucleic acids are DETECTED. The SARS-CoV-2 RNA is generally detectable in upper and lower  respiratory specimens dur ing the acute phase of infection.  Positive  results are indicative of active infection with SARS-CoV-2.  Clinical  correlation with patient history and other diagnostic information is  necessary to determine patient infection status.  Positive results do  not rule out bacterial infection or co-infection with other viruses. If result is PRESUMPTIVE POSTIVE SARS-CoV-2 nucleic acids MAY BE PRESENT.   A presumptive positive result was obtained on the submitted  specimen  and confirmed on repeat testing.  While 2019 novel coronavirus  (SARS-CoV-2) nucleic acids may be present in the submitted sample  additional confirmatory testing may be necessary for epidemiological  and / or clinical management purposes  to differentiate between  SARS-CoV-2 and other Sarbecovirus currently known to infect humans.  If clinically indicated additional testing with an alternate test  methodology [phone]) is advised. The SARS-CoV-2 RNA is generally  detectable in upper and lower respiratory sp ecimens during the acute  phase of infection. The expected result is Negative. Fact Sheet for Patients:  StrictlyIdeas.no Fact Sheet for Healthcare Providers: BankingDealers.co.za This test is not yet approved or cleared by the [place] FDA and has been authorized for detection and/or diagnosis of SARS-CoV-2 by FDA under an Emergency Use Authorization (EUA).  This EUA will remain in effect (meaning this test can be used) for the duration of the COVID-19 declaration under Section 564(b)(1) of the Act, 21 U.S.C. section  360bbb-3(b)(1), unless the authorization is terminated or revoked sooner. Performed at [facility], [place] [address], [place], [place] [address]   Blood culture (routine x 2)     Status: Abnormal (Preliminary result)   Collection Time: 01/16/2019  1:37 PM   Specimen: BLOOD  Result Value Ref Range Status   Specimen Description   Final    BLOOD PORTA CATH Performed at [place] [address], [place], [place] [address]    Special Requests   Final    BOTTLES DRAWN AEROBIC AND ANAEROBIC Blood Culture adequate volume Performed at [place] [address] [place], [place] [address]    Culture  Setup Time   Final    GRAM POSITIVE COCCI AEROBIC BOTTLE ONLY CRITICAL RESULT CALLED TO, READ BACK BY AND VERIFIED WITH: PHARMD M SWAAYNE [id number] AT 1217 BY CM GRAM POSITIVE RODS ANAEROBIC BOTTLE ONLY CRITICAL RESULT CALLED TO, READ BACK BY AND VERIFIED WITH: M. Erskin Burnet 1694 01/30/2019 T. TYSOR    Culture (A)  Final    STAPHYLOCOCCUS SPECIES (COAGULASE NEGATIVE) THE SIGNIFICANCE OF ISOLATING THIS ORGANISM FROM A SINGLE VENIPUNCTURE CANNOT BE PREDICTED WITHOUT FURTHER CLINICAL AND CULTURE CORRELATION. SUSCEPTIBILITIES AVAILABLE ONLY ON REQUEST. Performed at [facility], [place] [address], [place], [place] [address]    Report Status PENDING  Incomplete  Blood Culture ID Panel (Reflexed)     Status: Abnormal   Collection Time: 01/15/2019  1:37 PM  Result Value Ref Range Status   Enterococcus species NOT DETECTED NOT DETECTED Final   Listeria monocytogenes NOT DETECTED NOT DETECTED Final   Staphylococcus species DETECTED (A) NOT DETECTED Final    Comment: Methicillin (oxacillin) susceptible coagulase negative staphylococcus. Possible blood culture contaminant (unless isolated from more than one blood culture draw or clinical case suggests pathogenicity). No antibiotic treatment is indicated for blood  culture contaminants.  CRITICAL RESULT CALLED TO, READ BACK BY AND VERIFIED WITH: PHARMD Shelda Jakes [id number] AT 1217 BY CM    Staphylococcus aureus (BCID) NOT DETECTED NOT DETECTED Final   Methicillin resistance NOT DETECTED NOT DETECTED Final   Streptococcus species NOT DETECTED NOT DETECTED Final   Streptococcus agalactiae NOT DETECTED NOT DETECTED Final   Streptococcus pneumoniae NOT DETECTED NOT DETECTED Final   Streptococcus pyogenes NOT DETECTED NOT DETECTED Final   Acinetobacter baumannii NOT DETECTED NOT DETECTED Final   Enterobacteriaceae species NOT DETECTED NOT DETECTED Final   Enterobacter cloacae complex NOT DETECTED NOT DETECTED Final   Escherichia coli NOT DETECTED NOT  DETECTED Final   Klebsiella oxytoca NOT DETECTED NOT DETECTED Final   Klebsiella pneumoniae NOT DETECTED NOT DETECTED Final   Proteus species NOT DETECTED NOT DETECTED Final   Serratia marcescens NOT DETECTED NOT DETECTED Final   Haemophilus influenzae NOT DETECTED NOT DETECTED Final   Neisseria meningitidis NOT DETECTED NOT DETECTED Final   Pseudomonas aeruginosa NOT DETECTED NOT DETECTED Final   Candida albicans NOT DETECTED NOT DETECTED Final   Candida glabrata NOT DETECTED NOT DETECTED Final   Candida krusei NOT DETECTED NOT DETECTED Final   Candida parapsilosis NOT DETECTED NOT DETECTED Final   Candida tropicalis NOT DETECTED NOT DETECTED Final    Comment: Performed at [facility], [place] [address], [place], [place] [address]  Urine culture     Status: None   Collection Time: 02/01/2019  6:00 PM   Specimen: Urine, Clean Catch  Result Value Ref Range Status   Specimen Description   Final    URINE, CLEAN CATCH Performed at [facility], [place] [address] [place], [place] [address]    Special Requests   Final    NONE Performed at [facility], [place] [address], [place], [place] [address]    Culture   Final    NO GROWTH Performed at [facility], [place] [address] [place],  [place] [address]    Report Status 01/30/2019 FINAL  Final  MRSA PCR Screening     Status: None   Collection Time: 01/08/2019  7:39 PM   Specimen: Nasopharyngeal  Result Value Ref Range Status   MRSA by PCR NEGATIVE NEGATIVE Final    Comment:        The GeneXpert MRSA Assay (FDA approved for NASAL specimens only), is one component of a comprehensive MRSA colonization surveillance program. It is not intended to diagnose MRSA infection nor to guide or monitor treatment for MRSA infections. Performed at [facility], [place] [address], [place], [place] [address]      Radiology Studies: No results found.  Scheduled Meds: . Chlorhexidine Gluconate Cloth  6 each Topical Daily  . chlorproMAZINE  25 mg Oral TID  . docusate sodium  100 mg Oral BID  . feeding supplement (ENSURE ENLIVE)  237 mL Oral BID BM  . insulin aspart  0-5 Units Subcutaneous QHS  . insulin aspart  0-9 Units Subcutaneous TID WC  . insulin aspart  3 Units Subcutaneous TID WC  . levothyroxine  100 mcg Oral Daily  . metoprolol tartrate  50 mg Oral BID  . mirtazapine  7.5 mg Oral QHS  . pantoprazole  40 mg Oral BID  . sodium chloride flush  10-40 mL Intracatheter Q12H  . sodium zirconium cyclosilicate  10 g Oral Q lunch  . Tbo-filgastrim (GRANIX) SQ  300 mcg Subcutaneous q1800   Continuous Infusions: . azithromycin Stopped (01/31/19 2109)  . ceFEPime (MAXIPIME) IV 2 g (02/01/19 1207)     LOS: 4 days   Marylu Lund, MD [facility] Pager On Amion  If 7PM-7AM, please contact night-coverage 02/01/2019, 3:05 PM

## 2019-02-01 NOTE — Progress Notes (Signed)
HEMATOLOGY-ONCOLOGY PROGRESS NOTE  SUBJECTIVE: Reports that he is feeling better today.  Has been moved out of the stepdown/ICU area.  Remains afebrile.  Continues to have mild tachycardia.  Denies chest pain or shortness of breath.  Denies nausea and vomiting.  Denies bleeding this morning.  Oncology History  Adenocarcinoma of right lung, stage 4 ([place])  08/19/2018 Initial Diagnosis   Adenocarcinoma of right lung, stage 4 ([place])   08/24/2018 - 09/27/2018 Chemotherapy   The patient had palonosetron (ALOXI) injection 0.25 mg, 0.25 mg, Intravenous,  Once, 4 of 5 cycles Administration: 0.25 mg (08/24/2018), 0.25 mg (08/30/2018), 0.25 mg (09/06/2018), 0.25 mg (09/13/2018) CARBOplatin (PARAPLATIN) 210 mg in sodium chloride 0.9 % 250 mL chemo infusion, 210 mg (100 % of original dose 205 mg), Intravenous,  Once, 4 of 5 cycles Dose modification: 205 mg (original dose 205 mg, Cycle 1) Administration: 210 mg (08/24/2018), 210 mg (08/30/2018), 210 mg (09/06/2018), 210 mg (09/13/2018) PACLitaxel (TAXOL) 90 mg in sodium chloride 0.9 % 250 mL chemo infusion (</= 80mg /m2), 45 mg/m2 = 90 mg, Intravenous,  Once, 4 of 5 cycles Administration: 90 mg (08/24/2018), 90 mg (08/30/2018), 90 mg (09/06/2018), 90 mg (09/13/2018)  for chemotherapy treatment.    10/27/2018 -  Chemotherapy   The patient had palonosetron (ALOXI) injection 0.25 mg, 0.25 mg, Intravenous,  Once, 4 of 4 cycles Administration: 0.25 mg (10/27/2018), 0.25 mg (12/08/2018), 0.25 mg (11/17/2018) PEMEtrexed (ALIMTA) 1,000 mg in sodium chloride 0.9 % 100 mL chemo infusion, 975 mg, Intravenous,  Once, 4 of 12 cycles Administration: 1,000 mg (10/27/2018), 1,000 mg (12/08/2018), 1,000 mg (01/19/2019), 1,000 mg (11/17/2018) CARBOplatin (PARAPLATIN) 520 mg in sodium chloride 0.9 % 250 mL chemo infusion, 520 mg (100 % of original dose 515.5 mg), Intravenous,  Once, 4 of 4 cycles Dose modification: 515.5 mg (original dose 515.5 mg, Cycle 1), 519.5 mg (original dose 519.5 mg, Cycle 3),  415.6 mg (original dose 519.5 mg, Cycle 3, Reason: Dose not tolerated), 348.4 mg (original dose 348.4 mg, Cycle 4), 271.6 mg (original dose 348.4 mg, Cycle 4, Reason: Provider Judgment), 519.5 mg (original dose 519.5 mg, Cycle 2) Administration: 520 mg (10/27/2018), 420 mg (12/08/2018), 270 mg (12/29/2018), 520 mg (11/17/2018) pembrolizumab (KEYTRUDA) 200 mg in sodium chloride 0.9 % 50 mL chemo infusion, 200 mg, Intravenous, Once, 5 of 13 cycles Administration: 200 mg (10/27/2018), 200 mg (12/08/2018), 200 mg (12/29/2018), 200 mg (01/19/2019), 200 mg (11/17/2018) fosaprepitant (EMEND) 150 mg, dexamethasone (DECADRON) 12 mg in sodium chloride 0.9 % 145 mL IVPB, , Intravenous,  Once, 4 of 4 cycles Administration:  (10/27/2018),  (12/08/2018),  (12/29/2018),  (11/17/2018)  for chemotherapy treatment.       REVIEW OF SYSTEMS:   Constitutional: Denies fevers, chills Eyes: Denies blurriness of vision Ears, nose, mouth, throat, and face: Denies mucositis or sore throat Respiratory: Denies cough, dyspnea or wheezes Cardiovascular: Denies chest pain Gastrointestinal:  Denies nausea, heartburn or change in bowel habits Skin: Denies abnormal skin rashes Lymphatics: Denies new lymphadenopathy or easy bruising Neurological:Denies numbness, tingling or new weaknesses Behavioral/Psych: Mood is stable, no new changes  Extremities: No lower extremity edema All other systems were reviewed with the patient and are negative.  I have reviewed the past medical history, past surgical history, social history and family history with the patient and they are unchanged from previous note.   PHYSICAL EXAMINATION: ECOG PERFORMANCE STATUS: 1 - Symptomatic but completely ambulatory  Vitals:   02/01/19 0425 02/01/19 0945  BP: (!) 131/92 132/87  Pulse: (!) 120  Resp: 18 18  Temp: (!) 97.5 F (36.4 C) (!) 97.1 F (36.2 C)  SpO2: 100% 100%   Filed Weights   01/31/19 0500 01/31/19 2240 02/01/19 0425  Weight: 166 lb 14.2 oz  (75.7 kg) 158 lb 11.7 oz (72 kg) 158 lb 15.2 oz (72.1 kg)    Intake/Output from previous day: 09/28 0701 - 09/29 0700 In: 1273.5 [P.O.:1080; I.V.:10; IV Piggyback:183.5] Out: 3700 [Urine:3700]  GENERAL:alert, no distress and comfortable SKIN: skin color, texture, turgor are normal, no rashes or significant lesions EYES: normal, Conjunctiva are pink and non-injected, sclera clear OROPHARYNX:no exudate, no erythema and lips, buccal mucosa, and tongue normal  NECK: supple, thyroid normal size, non-tender, without nodularity LYMPH:  no palpable lymphadenopathy in the cervical, axillary or inguinal LUNGS: clear to auscultation and percussion with normal breathing effort HEART: regular rate & rhythm and no murmurs and trace lower extremity edema ABDOMEN:abdomen soft, non-tender and normal bowel sounds Musculoskeletal:no cyanosis of digits and no clubbing  NEURO: alert & oriented x 3 with fluent speech, no focal motor/sensory deficits  LABORATORY DATA:  I have reviewed the data as listed CMP Latest Ref Rng & Units 02/01/2019 01/31/2019 01/30/2019  Glucose 70 - 99 mg/dL 113(H) 143(H) 149(H)  BUN 8 - 23 mg/dL 25(H) 28(H) 39(H)  Creatinine 0.61 - 1.24 mg/dL 1.66(H) 1.72(H) 2.28(H)  Sodium 135 - 145 mmol/L 139 137 141  Potassium 3.5 - 5.1 mmol/L 3.8 4.0 5.6(H)  Chloride 98 - 111 mmol/L 110 109 113(H)  CO2 22 - 32 mmol/L 15(L) 15(L) 16(L)  Calcium 8.9 - 10.3 mg/dL 8.5(L) 8.5(L) 6.3(LL)  Total Protein 6.5 - 8.1 g/dL 5.7(L) 5.6(L) 5.6(L)  Total Bilirubin 0.3 - 1.2 mg/dL 2.0(H) 1.4(H) 1.4(H)  Alkaline Phos 38 - 126 U/L 65 64 60  AST 15 - 41 U/L 94(H) 40 25  ALT 0 - 44 U/L 46(H) 22 15    Lab Results  Component Value Date   WBC 0.2 (LL) 02/01/2019   HGB 7.4 (L) 02/01/2019   HCT 22.1 (L) 02/01/2019   MCV 94.8 02/01/2019   PLT 11 (LL) 02/01/2019   NEUTROABS 0.0 (L) 01/30/2019    US Renal  Result Date: 01/18/2019 CLINICAL DATA:  Renal failure EXAM: RENAL / URINARY TRACT ULTRASOUND COMPLETE  COMPARISON:  CT scan June 08, 2017 FINDINGS: Right Kidney: Renal measurements: 11.4 x 5.6 x 5.2 cm = volume: 173.7 mL . Echogenicity within normal limits. No mass or hydronephrosis visualized. Left Kidney: Renal measurements: 11.6 x 6.2 x 5.7 cm = volume: 213 mL. Contains a 1.7 cm cyst seen on previous CT imaging. Bladder: Appears normal for degree of bladder distention. IMPRESSION: 1. No cause for the patient's renal failure identified. A single left-sided renal cyst is noted, better assessed on previous CT imaging. Electronically Signed   By: Dorise Bullion III M.D   On: 01/29/2019 14:32   Dg Chest Port 1 View  Result Date: 01/19/2019 CLINICAL DATA:  Hypertension, lung cancer, diabetes mellitus, hypertension EXAM: PORTABLE CHEST 1 VIEW COMPARISON:  Portable exam 1343 hours compared to 12/09/2018 FINDINGS: Volume loss in the RIGHT hemithorax 6 with mild mediastinal shift from LEFT to RIGHT. RIGHT jugular Port-A-Cath with tip projecting over RIGHT atrium. Normal heart size and pulmonary vascularity. Mild patchy RIGHT perihilar and RIGHT upper lobe infiltrates. LEFT lung clear. Elevation of RIGHT diaphragm. No pleural effusion or pneumothorax. IMPRESSION: Patchy RIGHT perihilar and RIGHT upper lobe infiltrates. LEFT lung clear. Electronically Signed   By: Lavonia Dana M.D.   On:  01/20/2019 14:20   Dg Abd Portable 1v  Result Date: 01/30/2019 CLINICAL DATA:  Abdominal pain and constipation. EXAM: PORTABLE ABDOMEN - 1 VIEW COMPARISON:  None. FINDINGS: The bowel gas pattern is nonobstructive. The amount of fecal material in the colon is within normal limits. No other acute abnormalities identified. IMPRESSION: No evidence of constipation.  Nonobstructive bowel gas pattern. Electronically Signed   By: Dorise Bullion III M.D   On: 01/30/2019 15:56    ASSESSMENT AND PLAN: 1.  Stage IV non-small cell lung cancer, adenocarcinoma 2.  Pancytopenia secondary to recent chemotherapy and sepsis 3.  Pneumonia 4.   AKI on CKD 5.  Hypertension 6.  Diabetes  -Continue Granix 300 mcg daily for his neutropenia. -Transfuse packed red blood cells for hemoglobin less than 7 and transfuse platelets for platelet count less than 10,000 or active bleeding.  Since his platelet count is only 11,000 today, I will go ahead and give him 1 unit of platelets.  We will hold off on PRBC transfusion. -Continue antibiotics per hospitalist for pneumonia. -Renal function improving. Will need to reevaluate chemotherapy medications as an outpatient.  Alimta can cause renal insufficiency and we may need to discontinue this.  Will defer this discussion until next follow-up with medical oncology. -Continue metoprolol per hospitalist. -Insulin per hospitalist.   LOS: 4 days   Mikey Bussing, DNP, AGPCNP-BC, AOCNP 02/01/19

## 2019-02-02 ENCOUNTER — Telehealth: Payer: Self-pay | Admitting: Medical Oncology

## 2019-02-02 ENCOUNTER — Inpatient Hospital Stay: Payer: Medicare Other

## 2019-02-02 DIAGNOSIS — E872 Acidosis: Secondary | ICD-10-CM

## 2019-02-02 DIAGNOSIS — L89152 Pressure ulcer of sacral region, stage 2: Secondary | ICD-10-CM

## 2019-02-02 DIAGNOSIS — Z794 Long term (current) use of insulin: Secondary | ICD-10-CM

## 2019-02-02 DIAGNOSIS — E119 Type 2 diabetes mellitus without complications: Secondary | ICD-10-CM

## 2019-02-02 LAB — PREPARE PLATELET PHERESIS: Unit division: 0

## 2019-02-02 LAB — CBC WITH DIFFERENTIAL/PLATELET
Abs Immature Granulocytes: 0 10*3/uL (ref 0.00–0.07)
Basophils Absolute: 0 10*3/uL (ref 0.0–0.1)
Basophils Relative: 0 %
Eosinophils Absolute: 0 10*3/uL (ref 0.0–0.5)
Eosinophils Relative: 22 %
HCT: 21.7 % — ABNORMAL LOW (ref 39.0–52.0)
Hemoglobin: 7.1 g/dL — ABNORMAL LOW (ref 13.0–17.0)
Immature Granulocytes: 0 %
Lymphocytes Relative: 22 %
Lymphs Abs: 0 10*3/uL — ABNORMAL LOW (ref 0.7–4.0)
MCH: 31.1 pg (ref 26.0–34.0)
MCHC: 32.7 g/dL (ref 30.0–36.0)
MCV: 95.2 fL (ref 80.0–100.0)
Monocytes Absolute: 0 10*3/uL — ABNORMAL LOW (ref 0.1–1.0)
Monocytes Relative: 11 %
Neutro Abs: 0.1 10*3/uL — ABNORMAL LOW (ref 1.7–7.7)
Neutrophils Relative %: 45 %
Platelets: 20 10*3/uL — CL (ref 150–400)
RBC: 2.28 MIL/uL — ABNORMAL LOW (ref 4.22–5.81)
RDW: 16.8 % — ABNORMAL HIGH (ref 11.5–15.5)
WBC: 0.2 10*3/uL — CL (ref 4.0–10.5)
nRBC: 0 % (ref 0.0–0.2)

## 2019-02-02 LAB — BPAM PLATELET PHERESIS
Blood Product Expiration Date: 202009302359
ISSUE DATE / TIME: 202009290949
Unit Type and Rh: 6200

## 2019-02-02 LAB — GLUCOSE, CAPILLARY
Glucose-Capillary: 109 mg/dL — ABNORMAL HIGH (ref 70–99)
Glucose-Capillary: 127 mg/dL — ABNORMAL HIGH (ref 70–99)
Glucose-Capillary: 75 mg/dL (ref 70–99)
Glucose-Capillary: 94 mg/dL (ref 70–99)

## 2019-02-02 LAB — PREPARE RBC (CROSSMATCH)

## 2019-02-02 MED ORDER — DIPHENHYDRAMINE HCL 25 MG PO CAPS
25.0000 mg | ORAL_CAPSULE | Freq: Once | ORAL | Status: AC
  Administered 2019-02-02: 11:00:00 25 mg via ORAL
  Filled 2019-02-02: qty 1

## 2019-02-02 MED ORDER — ACETAMINOPHEN 325 MG PO TABS
650.0000 mg | ORAL_TABLET | Freq: Once | ORAL | Status: AC
  Administered 2019-02-02: 11:00:00 650 mg via ORAL
  Filled 2019-02-02: qty 2

## 2019-02-02 MED ORDER — SODIUM CHLORIDE 0.9% IV SOLUTION
Freq: Once | INTRAVENOUS | Status: AC
  Administered 2019-02-02: 10 mL/h via INTRAVENOUS

## 2019-02-02 MED ORDER — TBO-FILGRASTIM 480 MCG/0.8ML ~~LOC~~ SOSY
480.0000 ug | PREFILLED_SYRINGE | Freq: Every day | SUBCUTANEOUS | Status: DC
  Administered 2019-02-02 – 2019-02-03 (×2): 480 ug via SUBCUTANEOUS
  Filled 2019-02-02 (×3): qty 0.8

## 2019-02-02 NOTE — Progress Notes (Signed)
Occupational Therapy Treatment Patient Details Name: Zachary Knox MRN: [id number] DOB: 03/03/1951 Today's Date: 02/02/2019    History of present illness 68 yo male admitted with hypotesion, weakness, ARF, sepsis. Hx of met lung ca, DM, falls.   OT comments  Pt took a short walk and agreeable to sitting up in chair. Did not want to perform ADL activities this pm, other than donning gown as a robe.  Follow Up Recommendations  Home health OT;Supervision/Assistance - 24 hour    Equipment Recommendations  3 in 1 bedside commode    Recommendations for Other Services      Precautions / Restrictions Precautions Precautions: Fall Precaution Comments: tachy with activity Restrictions Weight Bearing Restrictions: No       Mobility Bed Mobility         Supine to sit: Min assist     General bed mobility comments: light steadying assistance  Transfers   Equipment used: Rolling walker (2 wheeled)   Sit to Stand: Min guard         General transfer comment: for safety    Balance     Sitting balance-Leahy Scale: Fair       Standing balance-Leahy Scale: Poor Standing balance comment: reliant on BUE support                           ADL either performed or assessed with clinical judgement   ADL                   Upper Body Dressing : Minimal assistance       Toilet Transfer: Min guard;Ambulation;RW(chair (simulated))             General ADL Comments: pt did not want to perform ADL activities this session.  Walked just outside of door, and turned around     [place]: Awake/alert Behavior During Therapy: WFL for tasks assessed/performed Overall Cognitive Status: Within Functional Limits for tasks assessed  Pt is dysarthric                                        Exercises     Shoulder Instructions       General Comments      Pertinent Vitals/  Pain       Pain Assessment: Faces Faces Pain Scale: No hurt  Home Living                                          Prior Functioning/Environment              Frequency  Min 2X/week        Progress Toward Goals  OT Goals(current goals can now be found in the care plan section)  Progress towards OT goals: Progressing toward goals     Plan      Co-evaluation    PT/OT/SLP Co-Evaluation/Treatment: Yes Reason for Co-Treatment: For patient/therapist safety PT goals addressed during session: Mobility/safety with mobility OT goals addressed during session: ADL's and self-care      AM-PAC OT "6 Clicks" Daily Activity     Outcome Measure   Help from another person eating meals?:  None Help from another person taking care of personal grooming?: A Little Help from another person toileting, which includes using toliet, bedpan, or urinal?: A Lot Help from another person bathing (including washing, rinsing, drying)?: A Lot Help from another person to put on and taking off regular upper body clothing?: A Little Help from another person to put on and taking off regular lower body clothing?: A Lot 6 Click Score: 16    End of Session    OT Visit Diagnosis: Unsteadiness on feet (R26.81);Other abnormalities of gait and mobility (R26.89);Muscle weakness (generalized) (M62.81);History of falling (Z91.81);Other symptoms and signs involving cognitive function   Activity Tolerance Patient limited by fatigue   Patient Left in chair;with call bell/phone within reach;with chair alarm set   Nurse Communication          Time: [phone] OT Time Calculation (min): 17 min  Charges: OT General Charges $OT Visit: 1 Visit OT Treatments $Therapeutic Activity: 8-22 mins  Lesle Chris, OTR/L Acute Rehabilitation Services [phone] [place] pager [phone] office 02/02/2019   [place] 02/02/2019, 3:43 PM

## 2019-02-02 NOTE — Progress Notes (Signed)
Physical Therapy Treatment Patient Details Name: Zachary Knox MRN: [id number] DOB: 1950/06/17 Today's Date: 02/02/2019    History of Present Illness 68 yo male admitted with hypotesion, weakness, ARF, sepsis. Hx of met lung ca, DM, falls.    PT Comments    Pt participated fairly well. He was only agreeable to ambulate a short distance. Will continue to follow.    Follow Up Recommendations  Home health PT;Supervision/Assistance - 24 hour     Equipment Recommendations  Rolling walker with 5" wheels    Recommendations for Other Services       Precautions / Restrictions Precautions Precautions: Fall Precaution Comments: tachy with activity Restrictions Weight Bearing Restrictions: No    Mobility  Bed Mobility Overal bed mobility: Needs Assistance Bed Mobility: Supine to Sit     Supine to sit: Min assist     General bed mobility comments: light assistance. increased time.  Transfers Overall transfer level: Needs assistance Equipment used: Rolling walker (2 wheeled) Transfers: Sit to/from Stand Sit to Stand: From elevated surface;Min guard         General transfer comment: for safety.  Ambulation/Gait Ambulation/Gait assistance: Min assist Gait Distance (Feet): 20 Feet Assistive device: Rolling walker (2 wheeled) Gait Pattern/deviations: Step-through pattern;Decreased stride length     General Gait Details: Assist to stabilize pt. Unsteady and at risk for falls. Pt only agreeable to short distance   Stairs             Wheelchair Mobility    Modified Rankin (Stroke Patients Only)       Balance Overall balance assessment: Needs assistance   Sitting balance-Leahy Scale: Fair     Standing balance support: Bilateral upper extremity supported Standing balance-Leahy Scale: Poor Standing balance comment: reliant on BUE support                            Cognition Arousal/Alertness: Awake/alert Behavior During Therapy: WFL for  tasks assessed/performed Overall Cognitive Status: Within Functional Limits for tasks assessed Area of Impairment: Safety/judgement                         Safety/Judgement: Decreased awareness of deficits;Decreased awareness of safety            Exercises      General Comments        Pertinent Vitals/Pain Pain Assessment: No/denies pain Faces Pain Scale: No hurt    Home Living                      Prior Function            PT Goals (current goals can now be found in the care plan section) Progress towards PT goals: Progressing toward goals    Frequency    Min 3X/week      PT Plan Current plan remains appropriate    Co-evaluation   Reason for Co-Treatment: For patient/therapist safety PT goals addressed during session: Mobility/safety with mobility OT goals addressed during session: ADL's and self-care      AM-PAC PT "6 Clicks" Mobility   Outcome Measure  Help needed turning from your back to your side while in a flat bed without using bedrails?: A Little Help needed moving from lying on your back to sitting on the side of a flat bed without using bedrails?: A Little Help needed moving to and from a bed to a chair (including a  wheelchair)?: A Little Help needed standing up from a chair using your arms (e.g., wheelchair or bedside chair)?: A Little Help needed to walk in hospital room?: A Little Help needed climbing 3-5 steps with a railing? : A Little 6 Click Score: 18    End of Session Equipment Utilized During Treatment: Gait belt Activity Tolerance: Patient tolerated treatment well Patient left: in chair;with call bell/phone within reach;with chair alarm set   PT Visit Diagnosis: Unsteadiness on feet (R26.81);History of falling (Z91.81);Repeated falls (R29.6);Difficulty in walking, not elsewhere classified (R26.2);Muscle weakness (generalized) (M62.81)     Time: 3437-3578 PT Time Calculation (min) (ACUTE ONLY): 14  min  Charges:                          Weston Anna, PT Acute Rehabilitation Services Pager: [phone] Office: [phone]

## 2019-02-02 NOTE — Progress Notes (Signed)
HEMATOLOGY-ONCOLOGY PROGRESS NOTE  SUBJECTIVE: Remains afebrile.  No chest pain.  Breathing is stable.  Denies bleeding.  Oncology History  Adenocarcinoma of right lung, stage 4 ([place])  08/19/2018 Initial Diagnosis   Adenocarcinoma of right lung, stage 4 ([place])   08/24/2018 - 09/27/2018 Chemotherapy   The patient had palonosetron (ALOXI) injection 0.25 mg, 0.25 mg, Intravenous,  Once, 4 of 5 cycles Administration: 0.25 mg (08/24/2018), 0.25 mg (08/30/2018), 0.25 mg (09/06/2018), 0.25 mg (09/13/2018) CARBOplatin (PARAPLATIN) 210 mg in sodium chloride 0.9 % 250 mL chemo infusion, 210 mg (100 % of original dose 205 mg), Intravenous,  Once, 4 of 5 cycles Dose modification: 205 mg (original dose 205 mg, Cycle 1) Administration: 210 mg (08/24/2018), 210 mg (08/30/2018), 210 mg (09/06/2018), 210 mg (09/13/2018) PACLitaxel (TAXOL) 90 mg in sodium chloride 0.9 % 250 mL chemo infusion (</= 80mg /m2), 45 mg/m2 = 90 mg, Intravenous,  Once, 4 of 5 cycles Administration: 90 mg (08/24/2018), 90 mg (08/30/2018), 90 mg (09/06/2018), 90 mg (09/13/2018)  for chemotherapy treatment.    10/27/2018 -  Chemotherapy   The patient had palonosetron (ALOXI) injection 0.25 mg, 0.25 mg, Intravenous,  Once, 4 of 4 cycles Administration: 0.25 mg (10/27/2018), 0.25 mg (12/08/2018), 0.25 mg (11/17/2018) PEMEtrexed (ALIMTA) 1,000 mg in sodium chloride 0.9 % 100 mL chemo infusion, 975 mg, Intravenous,  Once, 4 of 12 cycles Administration: 1,000 mg (10/27/2018), 1,000 mg (12/08/2018), 1,000 mg (01/19/2019), 1,000 mg (11/17/2018) CARBOplatin (PARAPLATIN) 520 mg in sodium chloride 0.9 % 250 mL chemo infusion, 520 mg (100 % of original dose 515.5 mg), Intravenous,  Once, 4 of 4 cycles Dose modification: 515.5 mg (original dose 515.5 mg, Cycle 1), 519.5 mg (original dose 519.5 mg, Cycle 3), 415.6 mg (original dose 519.5 mg, Cycle 3, Reason: Dose not tolerated), 348.4 mg (original dose 348.4 mg, Cycle 4), 271.6 mg (original dose 348.4 mg, Cycle 4, Reason:  Provider Judgment), 519.5 mg (original dose 519.5 mg, Cycle 2) Administration: 520 mg (10/27/2018), 420 mg (12/08/2018), 270 mg (12/29/2018), 520 mg (11/17/2018) pembrolizumab (KEYTRUDA) 200 mg in sodium chloride 0.9 % 50 mL chemo infusion, 200 mg, Intravenous, Once, 5 of 13 cycles Administration: 200 mg (10/27/2018), 200 mg (12/08/2018), 200 mg (12/29/2018), 200 mg (01/19/2019), 200 mg (11/17/2018) fosaprepitant (EMEND) 150 mg, dexamethasone (DECADRON) 12 mg in sodium chloride 0.9 % 145 mL IVPB, , Intravenous,  Once, 4 of 4 cycles Administration:  (10/27/2018),  (12/08/2018),  (12/29/2018),  (11/17/2018)  for chemotherapy treatment.       REVIEW OF SYSTEMS:   Constitutional: Denies fevers, chills Eyes: Denies blurriness of vision Ears, nose, mouth, throat, and face: Denies mucositis or sore throat Respiratory: Denies cough, dyspnea or wheezes Cardiovascular: Denies chest pain Gastrointestinal:  Denies nausea, heartburn or change in bowel habits Skin: Denies abnormal skin rashes Lymphatics: Denies new lymphadenopathy or easy bruising Neurological:Denies numbness, tingling or new weaknesses Behavioral/Psych: Mood is stable, no new changes  Extremities: No lower extremity edema All other systems were reviewed with the patient and are negative.  I have reviewed the past medical history, past surgical history, social history and family history with the patient and they are unchanged from previous note.   PHYSICAL EXAMINATION: ECOG PERFORMANCE STATUS: 1 - Symptomatic but completely ambulatory  Vitals:   02/01/19 2209 02/02/19 0542  BP: 132/87 126/88  Pulse: (!) 108 (!) 106  Resp: 18 18  Temp: (!) 97.5 F (36.4 C) (!) 97.5 F (36.4 C)  SpO2: 100% 100%   Filed Weights   01/31/19 2240 02/01/19  0425 02/02/19 0542  Weight: 158 lb 11.7 oz (72 kg) 158 lb 15.2 oz (72.1 kg) 157 lb 3 oz (71.3 kg)    Intake/Output from previous day: 09/29 0701 - 09/30 0700 In: 601.5 [P.O.:360; Blood:25; IV  Piggyback:216.5] Out: 952 [Urine:952]  GENERAL:alert, no distress and comfortable SKIN: skin color, texture, turgor are normal, no rashes or significant lesions EYES: normal, Conjunctiva are pink and non-injected, sclera clear OROPHARYNX:no exudate, no erythema and lips, buccal mucosa, and tongue normal  NECK: supple, thyroid normal size, non-tender, without nodularity LYMPH:  no palpable lymphadenopathy in the cervical, axillary or inguinal LUNGS: clear to auscultation and percussion with normal breathing effort HEART: regular rate & rhythm and no murmurs and trace lower extremity edema ABDOMEN:abdomen soft, non-tender and normal bowel sounds Musculoskeletal:no cyanosis of digits and no clubbing  NEURO: alert & oriented x 3 with fluent speech, no focal motor/sensory deficits  LABORATORY DATA:  I have reviewed the data as listed CMP Latest Ref Rng & Units 02/01/2019 01/31/2019 01/30/2019  Glucose 70 - 99 mg/dL 113(H) 143(H) 149(H)  BUN 8 - 23 mg/dL 25(H) 28(H) 39(H)  Creatinine 0.61 - 1.24 mg/dL 1.66(H) 1.72(H) 2.28(H)  Sodium 135 - 145 mmol/L 139 137 141  Potassium 3.5 - 5.1 mmol/L 3.8 4.0 5.6(H)  Chloride 98 - 111 mmol/L 110 109 113(H)  CO2 22 - 32 mmol/L 15(L) 15(L) 16(L)  Calcium 8.9 - 10.3 mg/dL 8.5(L) 8.5(L) 6.3(LL)  Total Protein 6.5 - 8.1 g/dL 5.7(L) 5.6(L) 5.6(L)  Total Bilirubin 0.3 - 1.2 mg/dL 2.0(H) 1.4(H) 1.4(H)  Alkaline Phos 38 - 126 U/L 65 64 60  AST 15 - 41 U/L 94(H) 40 25  ALT 0 - 44 U/L 46(H) 22 15    Lab Results  Component Value Date   WBC 0.2 (LL) 02/02/2019   HGB 7.1 (L) 02/02/2019   HCT 21.7 (L) 02/02/2019   MCV 95.2 02/02/2019   PLT 20 (LL) 02/02/2019   NEUTROABS 0.1 (L) 02/02/2019    US Renal  Result Date: 01/11/2019 CLINICAL DATA:  Renal failure EXAM: RENAL / URINARY TRACT ULTRASOUND COMPLETE COMPARISON:  CT scan June 08, 2017 FINDINGS: Right Kidney: Renal measurements: 11.4 x 5.6 x 5.2 cm = volume: 173.7 mL . Echogenicity within normal limits.  No mass or hydronephrosis visualized. Left Kidney: Renal measurements: 11.6 x 6.2 x 5.7 cm = volume: 213 mL. Contains a 1.7 cm cyst seen on previous CT imaging. Bladder: Appears normal for degree of bladder distention. IMPRESSION: 1. No cause for the patient's renal failure identified. A single left-sided renal cyst is noted, better assessed on previous CT imaging. Electronically Signed   By: Dorise Bullion III M.D   On: 01/16/2019 14:32   Dg Chest Port 1 View  Result Date: 01/05/2019 CLINICAL DATA:  Hypertension, lung cancer, diabetes mellitus, hypertension EXAM: PORTABLE CHEST 1 VIEW COMPARISON:  Portable exam 1343 hours compared to 12/09/2018 FINDINGS: Volume loss in the RIGHT hemithorax 6 with mild mediastinal shift from LEFT to RIGHT. RIGHT jugular Port-A-Cath with tip projecting over RIGHT atrium. Normal heart size and pulmonary vascularity. Mild patchy RIGHT perihilar and RIGHT upper lobe infiltrates. LEFT lung clear. Elevation of RIGHT diaphragm. No pleural effusion or pneumothorax. IMPRESSION: Patchy RIGHT perihilar and RIGHT upper lobe infiltrates. LEFT lung clear. Electronically Signed   By: Lavonia Dana M.D.   On: 01/04/2019 14:20   Dg Abd Portable 1v  Result Date: 01/30/2019 CLINICAL DATA:  Abdominal pain and constipation. EXAM: PORTABLE ABDOMEN - 1 VIEW COMPARISON:  None.  FINDINGS: The bowel gas pattern is nonobstructive. The amount of fecal material in the colon is within normal limits. No other acute abnormalities identified. IMPRESSION: No evidence of constipation.  Nonobstructive bowel gas pattern. Electronically Signed   By: Dorise Bullion III M.D   On: 01/30/2019 15:56    ASSESSMENT AND PLAN: 1.  Stage IV non-small cell lung cancer, adenocarcinoma 2.  Pancytopenia secondary to recent chemotherapy and sepsis 3.  Pneumonia 4.  AKI on CKD 5.  Hypertension 6.  Diabetes  -Continue Granix daily.  I have increased the dose to 480 mcg.  White blood cell count remains low. -We will  transfuse 1 unit of packed red blood cells for hemoglobin of 7.1.  Platelet count has improved to 20,000 today and will hold off on platelet transfusion. -Continue antibiotics per hospitalist for pneumonia. -Renal function has been improving improving, however, not checked today. Will need to reevaluate chemotherapy medications as an outpatient.  Alimta can cause renal insufficiency and we may need to discontinue this.  Will defer this discussion until next follow-up with medical oncology. -Continue metoprolol per hospitalist. -Insulin per hospitalist. -Recommend for the patient to stay in the hospital until his white blood cell count improves.  Would ideally like his ANC to be 1.0 or higher prior to discharging him.   LOS: 5 days   Mikey Bussing, DNP, AGPCNP-BC, AOCNP 02/02/19

## 2019-02-02 NOTE — Telephone Encounter (Signed)
Plan- She wants to hear from [facility] about the next steps and plan of care for her husband. He is in hospital with an " infection". I reviewed pt diagnosis and tx that he is receiving .   I told her pt needs to keep  follow up with [facility] next week . Wife wants a home health  Nurse assessment. Message sent to [facility].

## 2019-02-02 NOTE — TOC Progression Note (Signed)
Transition of Care [facility]) - Progression Note    Patient Details  Name: Zachary Knox MRN: [id number] Date of Birth: 22-Jan-1951  Transition of Care [facility]) CM/SW Contact  Gwendolynn Merkey, Juliann Pulse, RN Phone Number: 02/02/2019, 11:18 AM  Clinical Narrative:   02/02/2019 Medicare Home health results [email or web address] ASC&paging=118 1/2 Close window Home health agencies that serve [phone]. [place] Quality of Patient Care Rating Patient Survey Summary Rating [facility] [phone] [place] [phone] [place] [phone] [place] [phone] [place] [phone] [place] [phone] [facility] [place] [phone] [place] [phone] [place] [phone] [facility] [phone] Not [place] [phone] 4 out of 5 stars 4 out of 5 stars 3 out of 5 stars 4 out of 5 stars 4  out of 5 stars 3 out of 5 stars 4  out of 5 stars 4 out of 5 stars 4 out of 5 stars 4 out of 5 stars 4 out of 5 stars 4 out of 5 stars 3  out of 5 stars 4 out of 5 stars 2  out of 5 stars 3 out of 5 stars 3 out of 5 stars 4 out of 5 stars 4 out of 5 stars 3 out of 5 stars 4 out of 5 stars 02/02/2019 Medicare Home health results [email or web address] ASC&paging=118 2/2 [place] of Patient Care Rating Patient Survey Summary Rating [facility] [place] ([phone]) [phone] Not Available5 Not [place] [phone] [place] [phone] [facility] (910)  [phone] [facility] [phone] [place] [phone] Not [place] [phone] [place] number Footnote as displayed on [place] 3  out of 5 stars 3 out of 5 stars 5 out of 5 stars 4 out of 5 stars 3  out of 5 stars 4 out of 5 stars 3  out of 5 stars 3 out of 5 stars 3  out of 5 stars 4  out of 5 stars 3 out of 5 stars         Expected Discharge Plan and Services                                                 Social Determinants of Health (SDOH) Interventions    Readmission Risk Interventions No flowsheet data found.

## 2019-02-02 NOTE — TOC Progression Note (Signed)
Transition of Care [facility]) - Progression Note    Patient Details  Name: Zachary Knox MRN: [id number] Date of Birth: [birth year]  Transition of Care [facility]) CM/SW Contact  Cleone Hulick, Juliann Pulse, RN Phone Number: 02/02/2019, 2:01 PM  Clinical Narrative: PT recc HHPT. TC [phone] left vm await call back.           Expected Discharge Plan and Services                                                 Social Determinants of Health (SDOH) Interventions    Readmission Risk Interventions No flowsheet data found.

## 2019-02-02 NOTE — TOC Initial Note (Signed)
Transition of Care [facility]) - Initial/Assessment Note    Patient Details  Name: Zachary Knox MRN: [id number] Date of Birth: 05-06-1950  [facility]) CM/SW Contact:    Dessa Phi, RN Phone Number: 02/02/2019, 3:45 PM  Clinical Narrative:  Spoke to spouse-prefer home w/HHC-[facility] accepted-rep Darcella Cheshire [facility] [place] needs-HHPT/OT/aide.Family will transport on own.                   Barriers to Discharge: Continued Medical Work up   Patient Goals and CMS Choice Patient states their goals for this hospitalization and ongoing recovery are:: gohome CMS Medicare.gov Compare Post Acute Care list provided to:: Patient Represenative (must comment)(spouse) Choice offered to / list presented to : Spouse  Expected Discharge Plan and Services                                     HH Arranged: PT, OT, Nurse's Aide HH Agency: [facility] Date HH Agency Contacted: 02/02/19 Time [place]: [phone] Representative spoke with at [place]: [place] Arrangements/Services                       Activities of Daily Living      Permission Sought/Granted                  Emotional Assessment              Admission diagnosis:  AKI (acute kidney injury) ([place]) [N17.9] HCAP (healthcare-associated pneumonia) [J18.9] Severe sepsis ([place]) [A41.9, R65.20] Patient Active Problem List   Diagnosis Date Noted  . Pressure injury of skin 01/29/2019  . Sepsis due to pneumonia ([place]) 01/10/2019  . Hyperkalemia 01/31/2019  . Metabolic acidosis 62/13/0865  . AKI (acute kidney injury) ([place]) 01/07/2019  . Neutropenia with fever ([place]) 01/07/2019  . Anemia associated with chemotherapy 01/13/2019  . Uncomplicated sepsis ([place]) 12/09/2018  . Acute metabolic encephalopathy   . Primary malignant neoplasm of lung metastatic to other site [facility])   . Insulin dependent diabetes mellitus ([place])   . Thrombocytopenia ([place]) 11/29/2018  . Hypokalemia  11/29/2018  . Hyponatremia 11/29/2018  . Port-A-Cath in place 11/17/2018  . Encounter for antineoplastic immunotherapy 10/20/2018  . Adenocarcinoma of right lung, stage 4 ([place]) 08/19/2018  . Encounter for antineoplastic chemotherapy 08/19/2018  . Goals of care, counseling/discussion 08/19/2018  . Osseous metastasis ([place]) 07/26/2018  . 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 Tobacco abuse disorder 07/12/2018  . Alcohol abuse 07/12/2018   PCP:  Vincente Liberty, MD Pharmacy:   [facility] [place] [place] [address] Phone: [phone] Fax: [phone]     Social Determinants of Health (SDOH) Interventions    Readmission Risk Interventions No flowsheet data found.

## 2019-02-02 NOTE — Progress Notes (Signed)
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 intake, some cough and shortness of breath.  CXR revealed increased infiltrate on the right side. Admitted for sepsis due to pneumonia.  Subjective: No major events overnight of this morning.  Has no complaint this morning.  He is oriented x4- to date but not coherent.  Denies chest pain, dyspnea, GI or GU symptoms.  Objective: Vitals:   02/02/19 0542 02/02/19 0542 02/02/19 1130 02/02/19 1153  BP:  126/88 112/86 128/79  Pulse:  (!) 106 90 95  Resp:  18 20 18   Temp:  (!) 97.5 F (36.4 C) 97.8 F (36.6 C) 97.6 F (36.4 C)  TempSrc:  Oral Oral   SpO2:  100%  100%  Weight: 71.3 kg     Height:        Intake/Output Summary (Last 24 hours) at 02/02/2019 1354 Last data filed at 02/02/2019 1138 Gross per 24 hour  Intake 601.53 ml  Output 452 ml  Net 149.53 ml   Filed Weights   01/31/19 2240 02/01/19 0425 02/02/19 0542  Weight: 72 kg 72.1 kg 71.3 kg    Examination:  GENERAL: No acute distress.  Chronically ill-appearing. HEENT: MMM.  Vision and hearing grossly intact.  NECK: Supple.  No apparent JVD.  RESP:  No IWOB. Good air movement bilaterally. CVS:  RRR. Heart sounds normal.  ABD/GI/GU: Bowel sounds present. Soft. Non tender.  MSK/EXT:  Moves extremities. No apparent deformity or edema.  SKIN: no apparent skin lesion or wound NEURO: Awake, alert and oriented for minus date but not coherent.  No gross deficit.  PSYCH: Calm. Normal affect.   Assessment & Plan: Sepsis due to pneumonia in a patient with neutropenia: POA.  Sepsis physiology resolved. -Initial CXR with increased infiltrate on the right  side. -Continue IV cefepime and azithromycin.  Stage IV non-small cell lung cancer with bone metastasis Pancytopenia-neutropenia -WBC stays low as 0.2. -Hgb stable at 7.1>1u -Platelet 11k> 1 apheresis>20k  -Granix per oncology -Monitor CBC.  Acute kidney injury on CKD-3/hyperkalemia/metabolic acidosis-renal ultrasound negative for hydronephrosis. -AKI improved.  Hyperkalemia resolved -Recheck renal function in the morning  IDDM-2 with renal complication: CBG was in fair range. -Continue current regimen  Essential hypertension: Normotensive. -Continue metoprolol.  Chronic pain -Continue as needed pain medications  Pressure ulcer: Stage II sacral wound.  POA. Pressure Injury 01/07/2019 Sacrum Posterior;Mid Stage II - Partial thickness loss of dermis presenting as a shallow open ulcer with a red, pink wound bed without slough. (Active)  01/09/2019 2000  Location: Sacrum  Location Orientation: Posterior;Mid  Staging: Stage II - Partial thickness loss of dermis presenting as a shallow open ulcer with a red, pink wound bed without slough.    DVT prophylaxis: SCD due to thrombocytopenia Code Status: Full code Family Communication: Patient and/or RN. Available if any question. Disposition Plan: Remains inpatient.  Patient with severe leukopenia/neutropenia.  Consultants: Oncology  Procedures:  None  Microbiology summarized: [id number] negative. Blood culture with coag negative staph in 1 of the 2 bottles  Antimicrobials: Anti-infectives (From admission, onward)   Start     Dose/Rate Route Frequency Ordered Stop   01/31/19 1200  ceFEPIme (MAXIPIME) 2 g in sodium chloride 0.9 % 100 mL  IVPB     2 g 200 mL/hr over 30 Minutes Intravenous Every 12 hours 01/31/19 1058     01/30/19 1400  vancomycin (VANCOCIN) IVPB 1000 mg/200 mL premix  Status:  Discontinued     1,000 mg 200 mL/hr over 60 Minutes Intravenous Every 48 hours 01/25/2019 1935 01/29/19 1222   01/29/19 1600  ceFEPIme  (MAXIPIME) 2 g in sodium chloride 0.9 % 100 mL IVPB  Status:  Discontinued     2 g 200 mL/hr over 30 Minutes Intravenous Every 24 hours 01/15/2019 1932 01/31/19 1058   01/06/2019 2000  azithromycin (ZITHROMAX) 500 mg in sodium chloride 0.9 % 250 mL IVPB     500 mg 250 mL/hr over 60 Minutes Intravenous Every 24 hours 01/11/2019 1928     01/22/2019 1500  ceFEPIme (MAXIPIME) 2 g in sodium chloride 0.9 % 100 mL IVPB     2 g 200 mL/hr over 30 Minutes Intravenous STAT 01/18/2019 1446 01/09/2019 1910   01/09/2019 1500  vancomycin (VANCOCIN) 1,500 mg in sodium chloride 0.9 % 500 mL IVPB     1,500 mg 250 mL/hr over 120 Minutes Intravenous STAT 01/18/2019 1446 01/08/2019 1719      Sch Meds:  Scheduled Meds: . Chlorhexidine Gluconate Cloth  6 each Topical Daily  . chlorproMAZINE  25 mg Oral TID  . docusate sodium  100 mg Oral BID  . feeding supplement (ENSURE ENLIVE)  237 mL Oral BID BM  . insulin aspart  0-5 Units Subcutaneous QHS  . insulin aspart  0-9 Units Subcutaneous TID WC  . insulin aspart  3 Units Subcutaneous TID WC  . levothyroxine  100 mcg Oral Daily  . metoprolol tartrate  50 mg Oral BID  . mirtazapine  7.5 mg Oral QHS  . pantoprazole  40 mg Oral BID  . sodium chloride flush  10-40 mL Intracatheter Q12H  . Tbo-filgastrim (GRANIX) SQ  480 mcg Subcutaneous q1800   Continuous Infusions: . azithromycin 500 mg (02/01/19 1951)  . ceFEPime (MAXIPIME) IV 2 g (02/02/19 0006)   PRN Meds:.acetaminophen **OR** acetaminophen, chlorpheniramine-HYDROcodone, metoprolol tartrate, ondansetron **OR** ondansetron (ZOFRAN) IV, polyethylene glycol, sodium chloride flush   I have personally reviewed the following labs and images: CBC: Recent Labs  Lab 01/05/2019 1140  01/19/2019 1305 01/29/19 0330 01/30/19 0500 01/31/19 0645 02/01/19 0404 02/02/19 0350  WBC 0.4*   < > 0.3* 0.2* 0.2* 0.2* 0.2* 0.2*  NEUTROABS 0.2*  --  0.2*  --  0.0*  --   --  0.1*  HGB 7.7*   < > 7.4* 8.0* 7.6* 7.2* 7.4* 7.1*  HCT 24.3*  --   22.9* 23.8* 23.1* 21.9* 22.1* 21.7*  MCV 101.3*  --  102.7* 97.1 96.7 97.3 94.8 95.2  PLT 14*   < > 12* 7* 30* 16* 11* 20*   < > = values in this interval not displayed.   BMP &GFR Recent Labs  Lab 01/12/2019 1305 01/29/19 0330 01/30/19 0500 01/31/19 0645 02/01/19 0404  NA 134* 137 141 137 139  K 5.3* 5.0 5.6* 4.0 3.8  CL 107 112* 113* 109 110  CO2 15* 15* 16* 15* 15*  GLUCOSE 170* 111* 149* 143* 113*  BUN 54* 50* 39* 28* 25*  CREATININE 3.66* 3.10* 2.28* 1.72* 1.66*  CALCIUM 8.1* 7.7* 6.3* 8.5* 8.5*  MG  --   --  0.9* 2.2  --    Estimated Creatinine Clearance: 42.6 mL/min (A) (by C-G formula based on SCr of 1.66 mg/dL (H)). Liver & Pancreas:  Recent Labs  Lab 01/18/2019 1305 01/29/19 0330 01/30/19 0500 01/31/19 0645 02/01/19 0404  AST 20 23 25  40 94*  ALT 15 14 15 22  46*  ALKPHOS 58 51 60 64 65  BILITOT 1.3* 1.6* 1.4* 1.4* 2.0*  PROT 5.9* 5.4* 5.6* 5.6* 5.7*  ALBUMIN 2.4* 2.2* 2.1* 2.1* 2.0*   No results for input(s): LIPASE, AMYLASE in the last 168 hours. No results for input(s): AMMONIA in the last 168 hours. Diabetic: No results for input(s): HGBA1C in the last 72 hours. Recent Labs  Lab 02/01/19 1102 02/01/19 1659 02/01/19 2208 02/02/19 0731 02/02/19 1100  GLUCAP 80 96 95 109* 127*   Cardiac Enzymes: No results for input(s): CKTOTAL, CKMB, CKMBINDEX, TROPONINI in the last 168 hours. No results for input(s): PROBNP in the last 8760 hours. Coagulation Profile: No results for input(s): INR, PROTIME in the last 168 hours. Thyroid Function Tests: No results for input(s): TSH, T4TOTAL, FREET4, T3FREE, THYROIDAB in the last 72 hours. Lipid Profile: No results for input(s): CHOL, HDL, LDLCALC, TRIG, CHOLHDL, LDLDIRECT in the last 72 hours. Anemia Panel: No results for input(s): VITAMINB12, FOLATE, FERRITIN, TIBC, IRON, RETICCTPCT in the last 72 hours. Urine analysis:    Component Value Date/Time   COLORURINE YELLOW 01/15/2019 1800   APPEARANCEUR HAZY (A)  01/18/2019 1800   LABSPEC 1.010 01/31/2019 1800   PHURINE 5.0 01/19/2019 1800   GLUCOSEU NEGATIVE 01/22/2019 1800   HGBUR NEGATIVE 01/24/2019 1800   BILIRUBINUR NEGATIVE 01/19/2019 1800   KETONESUR NEGATIVE 01/12/2019 1800   PROTEINUR NEGATIVE 01/30/2019 1800   UROBILINOGEN 1.0 12/16/2008 0451   NITRITE NEGATIVE 02/01/2019 1800   LEUKOCYTESUR NEGATIVE 01/22/2019 1800   Sepsis Labs: Invalid input(s): PROCALCITONIN, [place]  Microbiology: Recent Results (from the past 240 hour(s))  SARS Coronavirus 2 [facility] order, Performed in [facility] hospital lab) Nasopharyngeal Nasopharyngeal Swab     Status: None   Collection Time: 01/25/2019  1:06 PM   Specimen: Nasopharyngeal Swab  Result Value Ref Range Status   SARS Coronavirus 2 NEGATIVE NEGATIVE Final    Comment: (NOTE) If result is NEGATIVE SARS-CoV-2 target nucleic acids are NOT DETECTED. The SARS-CoV-2 RNA is generally detectable in upper and lower  respiratory specimens during the acute phase of infection. The lowest  concentration of SARS-CoV-2 viral copies this assay can detect is 250  copies / mL. A negative result does not preclude SARS-CoV-2 infection  and should not be used as the sole basis for treatment or other  patient management decisions.  A negative result may occur with  improper specimen collection / handling, submission of specimen other  than nasopharyngeal swab, presence of viral mutation(s) within the  areas targeted by this assay, and inadequate number of viral copies  (<250 copies / mL). A negative result must be combined with clinical  observations, patient history, and epidemiological information. If result is POSITIVE SARS-CoV-2 target nucleic acids are DETECTED. The SARS-CoV-2 RNA is generally detectable in upper and lower  respiratory specimens dur ing the acute phase of infection.  Positive  results are indicative of active infection with SARS-CoV-2.  Clinical  correlation with patient history  and other diagnostic information is  necessary to determine patient infection status.  Positive results do  not rule out bacterial infection or co-infection with other viruses. If result is PRESUMPTIVE POSTIVE SARS-CoV-2 nucleic acids MAY BE PRESENT.   A presumptive positive result was obtained on the submitted specimen  and confirmed on repeat testing.  While 2019 novel coronavirus  (SARS-CoV-2) nucleic  acids may be present in the submitted sample  additional confirmatory testing may be necessary for epidemiological  and / or clinical management purposes  to differentiate between  SARS-CoV-2 and other Sarbecovirus currently known to infect humans.  If clinically indicated additional testing with an alternate test  methodology [phone]) is advised. The SARS-CoV-2 RNA is generally  detectable in upper and lower respiratory sp ecimens during the acute  phase of infection. The expected result is Negative. Fact Sheet for Patients:  StrictlyIdeas.no Fact Sheet for Healthcare Providers: BankingDealers.co.za This test is not yet approved or cleared by the [place] FDA and has been authorized for detection and/or diagnosis of SARS-CoV-2 by FDA under an Emergency Use Authorization (EUA).  This EUA will remain in effect (meaning this test can be used) for the duration of the COVID-19 declaration under Section 564(b)(1) of the Act, 21 U.S.C. section 360bbb-3(b)(1), unless the authorization is terminated or revoked sooner. Performed at [facility], [place] [address] [place], [place] [address]   Blood culture (routine x 2)     Status: Abnormal (Preliminary result)   Collection Time: 01/11/2019  1:37 PM   Specimen: BLOOD  Result Value Ref Range Status   Specimen Description   Final    BLOOD PORTA CATH Performed at [facility] [address], [place], [place] [address]    Special Requests   Final     BOTTLES DRAWN AEROBIC AND ANAEROBIC Blood Culture adequate volume Performed at [facility] [address] [place], [place] [address]    Culture  Setup Time   Final    GRAM POSITIVE COCCI AEROBIC BOTTLE ONLY CRITICAL RESULT CALLED TO, READ BACK BY AND VERIFIED WITH: PHARMD M SWAAYNE [id number] AT 1217 BY CM GRAM POSITIVE RODS ANAEROBIC BOTTLE ONLY CRITICAL RESULT CALLED TO, READ BACK BY AND VERIFIED WITH: M. Erskin Burnet [id number] 01/30/2019 T. TYSOR    Culture (A)  Final    STAPHYLOCOCCUS SPECIES (COAGULASE NEGATIVE) THE SIGNIFICANCE OF ISOLATING THIS ORGANISM FROM A SINGLE VENIPUNCTURE CANNOT BE PREDICTED WITHOUT FURTHER CLINICAL AND CULTURE CORRELATION. SUSCEPTIBILITIES AVAILABLE ONLY ON REQUEST. CULTURE REINCUBATED FOR BETTER GROWTH Performed at [facility], [place] [address] [place],  [address]    Report Status PENDING  Incomplete  Blood Culture ID Panel (Reflexed)     Status: Abnormal   Collection Time: 01/15/2019  1:37 PM  Result Value Ref Range Status   Enterococcus species NOT DETECTED NOT DETECTED Final   Listeria monocytogenes NOT DETECTED NOT DETECTED Final   Staphylococcus species DETECTED (A) NOT DETECTED Final    Comment: Methicillin (oxacillin) susceptible coagulase negative staphylococcus. Possible blood culture contaminant (unless isolated from more than one blood culture draw or clinical case suggests pathogenicity). No antibiotic treatment is indicated for blood  culture contaminants. CRITICAL RESULT CALLED TO, READ BACK BY AND VERIFIED WITH: PHARMD Shelda Jakes [id number] AT 1217 BY CM    Staphylococcus aureus (BCID) NOT DETECTED NOT DETECTED Final   Methicillin resistance NOT DETECTED NOT DETECTED Final   Streptococcus species NOT DETECTED NOT DETECTED Final   Streptococcus agalactiae NOT DETECTED NOT DETECTED Final   Streptococcus pneumoniae NOT DETECTED NOT DETECTED Final   Streptococcus pyogenes NOT DETECTED NOT DETECTED Final   Acinetobacter  baumannii NOT DETECTED NOT DETECTED Final   Enterobacteriaceae species NOT DETECTED NOT DETECTED Final   Enterobacter cloacae complex NOT DETECTED NOT DETECTED Final   Escherichia coli NOT DETECTED NOT DETECTED Final   Klebsiella oxytoca NOT DETECTED NOT DETECTED  Final   Klebsiella pneumoniae NOT DETECTED NOT DETECTED Final   Proteus species NOT DETECTED NOT DETECTED Final   Serratia marcescens NOT DETECTED NOT DETECTED Final   Haemophilus influenzae NOT DETECTED NOT DETECTED Final   Neisseria meningitidis NOT DETECTED NOT DETECTED Final   Pseudomonas aeruginosa NOT DETECTED NOT DETECTED Final   Candida albicans NOT DETECTED NOT DETECTED Final   Candida glabrata NOT DETECTED NOT DETECTED Final   Candida krusei NOT DETECTED NOT DETECTED Final   Candida parapsilosis NOT DETECTED NOT DETECTED Final   Candida tropicalis NOT DETECTED NOT DETECTED Final    Comment: Performed at [facility], [place] [address] [place], [place] [address]  Urine culture     Status: None   Collection Time: 01/27/2019  6:00 PM   Specimen: Urine, Clean Catch  Result Value Ref Range Status   Specimen Description   Final    URINE, CLEAN CATCH Performed at [facility], [place] [address], [place], [place] [address]    Special Requests   Final    NONE Performed at [facility], [place] [address], [place], [place] [address]    Culture   Final    NO GROWTH Performed at [facility], [place] [address], [place],  [address]    Report Status 01/30/2019 FINAL  Final  MRSA PCR Screening     Status: None   Collection Time: 01/07/2019  7:39 PM   Specimen: Nasopharyngeal  Result Value Ref Range Status   MRSA by PCR NEGATIVE NEGATIVE Final    Comment:        The GeneXpert MRSA Assay (FDA approved for NASAL specimens only), is one component of a comprehensive MRSA colonization surveillance program. It is not intended to diagnose MRSA infection nor to guide or monitor  treatment for MRSA infections. Performed at [facility], [place] [address], [place],  [address]     Radiology Studies: No results found.  35 minutes with more than 50% spent in reviewing records, counseling patient and coordinating care.  Emaan Gary T. [place]  If 7PM-7AM, please contact night-coverage [email or web address] Password [email or web address] 02/02/2019, 1:54 PM

## 2019-02-02 NOTE — Progress Notes (Signed)
Resumed care of patient from [facility]. Agree with her previous assessment. Will continue to monitor patient.

## 2019-02-03 ENCOUNTER — Encounter (HOSPITAL_COMMUNITY): Payer: Self-pay

## 2019-02-03 ENCOUNTER — Other Ambulatory Visit: Payer: Self-pay

## 2019-02-03 LAB — TYPE AND SCREEN
ABO/RH(D): A POS
Antibody Screen: NEGATIVE
Unit division: 0

## 2019-02-03 LAB — CULTURE, BLOOD (ROUTINE X 2): Special Requests: ADEQUATE

## 2019-02-03 LAB — CBC
HCT: 25.5 % — ABNORMAL LOW (ref 39.0–52.0)
Hemoglobin: 8.6 g/dL — ABNORMAL LOW (ref 13.0–17.0)
MCH: 31.4 pg (ref 26.0–34.0)
MCHC: 33.7 g/dL (ref 30.0–36.0)
MCV: 93.1 fL (ref 80.0–100.0)
Platelets: 12 10*3/uL — CL (ref 150–400)
RBC: 2.74 MIL/uL — ABNORMAL LOW (ref 4.22–5.81)
RDW: 17.1 % — ABNORMAL HIGH (ref 11.5–15.5)
WBC: 0.4 10*3/uL — CL (ref 4.0–10.5)
nRBC: 0 % (ref 0.0–0.2)

## 2019-02-03 LAB — COMPREHENSIVE METABOLIC PANEL
ALT: 89 U/L — ABNORMAL HIGH (ref 0–44)
AST: 145 U/L — ABNORMAL HIGH (ref 15–41)
Albumin: 2 g/dL — ABNORMAL LOW (ref 3.5–5.0)
Alkaline Phosphatase: 81 U/L (ref 38–126)
Anion gap: 12 (ref 5–15)
BUN: 30 mg/dL — ABNORMAL HIGH (ref 8–23)
CO2: 17 mmol/L — ABNORMAL LOW (ref 22–32)
Calcium: 8.6 mg/dL — ABNORMAL LOW (ref 8.9–10.3)
Chloride: 114 mmol/L — ABNORMAL HIGH (ref 98–111)
Creatinine, Ser: 1.41 mg/dL — ABNORMAL HIGH (ref 0.61–1.24)
GFR calc Af Amer: 59 mL/min — ABNORMAL LOW (ref >60)
GFR calc non Af Amer: 51 mL/min — ABNORMAL LOW (ref >60)
Glucose, Bld: 129 mg/dL — ABNORMAL HIGH (ref 70–99)
Potassium: 3.2 mmol/L — ABNORMAL LOW (ref 3.5–5.1)
Sodium: 143 mmol/L (ref 135–145)
Total Bilirubin: 2.1 mg/dL — ABNORMAL HIGH (ref 0.3–1.2)
Total Protein: 5.8 g/dL — ABNORMAL LOW (ref 6.5–8.1)

## 2019-02-03 LAB — BPAM RBC
Blood Product Expiration Date: 202010212359
ISSUE DATE / TIME: 202009301132
Unit Type and Rh: 6200

## 2019-02-03 LAB — CK: Total CK: 88 U/L (ref 49–397)

## 2019-02-03 LAB — HEPATITIS PANEL, ACUTE
HCV Ab: NONREACTIVE
Hep A IgM: NONREACTIVE
Hep B C IgM: NONREACTIVE
Hepatitis B Surface Ag: NONREACTIVE

## 2019-02-03 LAB — GLUCOSE, CAPILLARY
Glucose-Capillary: 119 mg/dL — ABNORMAL HIGH (ref 70–99)
Glucose-Capillary: 146 mg/dL — ABNORMAL HIGH (ref 70–99)
Glucose-Capillary: 140 mg/dL — ABNORMAL HIGH (ref 70–99)
Glucose-Capillary: 148 mg/dL — ABNORMAL HIGH (ref 70–99)

## 2019-02-03 LAB — MAGNESIUM: Magnesium: 1.6 mg/dL — ABNORMAL LOW (ref 1.7–2.4)

## 2019-02-03 LAB — TROPONIN I (HIGH SENSITIVITY)
Troponin I (High Sensitivity): 13 ng/L (ref <18)
Troponin I (High Sensitivity): 13 ng/L (ref <18)

## 2019-02-03 LAB — PHOSPHORUS: Phosphorus: 3.4 mg/dL (ref 2.5–4.6)

## 2019-02-03 MED ORDER — ACETAMINOPHEN 325 MG PO TABS
650.0000 mg | ORAL_TABLET | Freq: Once | ORAL | Status: AC
  Administered 2019-02-03: 12:00:00 650 mg via ORAL
  Filled 2019-02-03: qty 2

## 2019-02-03 MED ORDER — SODIUM CHLORIDE 0.9% IV SOLUTION
Freq: Once | INTRAVENOUS | Status: AC
  Administered 2019-02-03: 12:00:00 via INTRAVENOUS

## 2019-02-03 MED ORDER — MORPHINE SULFATE (PF) 2 MG/ML IV SOLN
1.0000 mg | Freq: Once | INTRAVENOUS | Status: AC
  Administered 2019-02-03: 15:00:00 1 mg via INTRAVENOUS
  Filled 2019-02-03: qty 1

## 2019-02-03 MED ORDER — MAGNESIUM SULFATE 2 GM/50ML IV SOLN
2.0000 g | Freq: Once | INTRAVENOUS | Status: AC
  Administered 2019-02-03: 11:00:00 2 g via INTRAVENOUS
  Filled 2019-02-03: qty 50

## 2019-02-03 MED ORDER — POTASSIUM CHLORIDE CRYS ER 20 MEQ PO TBCR
40.0000 meq | EXTENDED_RELEASE_TABLET | ORAL | Status: AC
  Administered 2019-02-03 (×2): 40 meq via ORAL
  Filled 2019-02-03 (×2): qty 2

## 2019-02-03 MED ORDER — DIPHENHYDRAMINE HCL 25 MG PO CAPS
25.0000 mg | ORAL_CAPSULE | Freq: Once | ORAL | Status: AC
  Administered 2019-02-03: 25 mg via ORAL
  Filled 2019-02-03: qty 1

## 2019-02-03 MED ORDER — ADULT MULTIVITAMIN W/MINERALS CH
1.0000 | ORAL_TABLET | Freq: Every day | ORAL | Status: DC
  Administered 2019-02-03: 14:00:00 1 via ORAL
  Filled 2019-02-03: qty 1

## 2019-02-03 MED ORDER — NITROGLYCERIN 0.4 MG SL SUBL
0.4000 mg | SUBLINGUAL_TABLET | SUBLINGUAL | Status: DC | PRN
  Administered 2019-02-03 (×2): 0.4 mg via SUBLINGUAL
  Filled 2019-02-03: qty 1

## 2019-02-03 NOTE — Progress Notes (Signed)
Nutrition Follow-up  DOCUMENTATION CODES:   Not applicable  INTERVENTION:  - continue Ensure Enlive BID. - will order Magic Cup BID with meals, each supplement provides 290 kcal and 9 grams of protein. - continue to encourage PO intakes.    NUTRITION DIAGNOSIS:   Increased nutrient needs related to cancer and cancer related treatments as evidenced by estimated needs. -ongoing  GOAL:   Patient will meet greater than or equal to 90% of their needs -unmet  MONITOR:   PO intake, Supplement acceptance, Labs, Weight trends, I & O's  ASSESSMENT:   68 y.o. 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  Per flow sheet, he consumed 50% of breakfast on 9/28 (182 kcal, 6 grams protein); 25% of breakfast and 50% of lunch and dinner on 9/29 (total of 512 kcal, 11.5 grams protein); 0% of breakfast and 10% of lunch on 9/30 (31 kcal, 2 grams protein); 30% of breakfast this AM (135 kcal, 4 grams protein). Per review of order, patient has accepted Ensure 50% of the time offered.   Per notes: - sepsis--resolved - PNA--increased infiltrate on the R side - stage 4 NSCLC with bone mets - AKI on stage 3 CKD--improved - metabolic acidosis--improved - elevated liver enzymes--consistent with rhabdo - plan for patient to remain inpatient pending Oncology clearance d/t severe neutropenia--Onc note from this AM states that Zachary Knox needs to be >/= 500 prior to d/c    Labs reviewed; CBGs: 119 and 146 mg/dl, K: 3.2 mmol/l, Cl: 114 mmol/l, BUN: 30 mg/dl, creatinine: 1.41 mg/dl, Ca: 8.6 mg/dl, Mg: 1.6 mg/dl, LFTs elevated, GFR: 51 ml/min. Medications reviewed; 100 mg colace BID, sliding scale novolog, 3 units novolog TID, 100 mcg oral synthroid/day, 2 g IV Mg sulfate x1 dose 10/1, 40 mg oral protonix BID, 40 mEq Klor-Con every 4  hours.    Diet Order:   Diet Order            Diet Carb Modified Fluid consistency: Thin; Room service appropriate? Yes  Diet effective now              EDUCATION NEEDS:   No education needs have been identified at this time  Skin:  Skin Assessment: Skin Integrity Issues: Skin Integrity Issues:: Stage II Stage II: sacrum  Last BM:  10/1  Height:   Ht Readings from Last 1 Encounters:  01/31/19 '5\' 9"'  (1.753 m)    Weight:   Wt Readings from Last 1 Encounters:  02/03/19 71.1 kg    Ideal Body Weight:  72.7 kg  BMI:  Body mass index is 23.15 kg/m.  Estimated Nutritional Needs:   Kcal:  2200-2400  Protein:  110-120g  Fluid:  2L/day     Zachary Matin, MS, RD, LDN, CNSC Inpatient Clinical Dietitian Pager # [phone] After hours/weekend pager # [phone]

## 2019-02-03 NOTE — Care Management Important Message (Signed)
Important Message  Patient Details IM Letter given to Dessa Phi RN to present to the Patient Name: Zachary Knox MRN: [id number] Date of Birth: July 31, 1950   Medicare Important Message Given:  Yes     Kerin Salen 02/03/2019, 12:58 PM

## 2019-02-03 NOTE — Progress Notes (Signed)
HEMATOLOGY-ONCOLOGY PROGRESS NOTE  SUBJECTIVE: Remains afebrile.  No chest pain.  Breathing is stable.  Denies bleeding.  Seems more confused this morning but able to reorient easily.  He knows that he has at [facility].  Oncology History  Adenocarcinoma of right lung, stage 4 ([place])  08/19/2018 Initial Diagnosis   Adenocarcinoma of right lung, stage 4 ([place])   08/24/2018 - 09/27/2018 Chemotherapy   The patient had palonosetron (ALOXI) injection 0.25 mg, 0.25 mg, Intravenous,  Once, 4 of 5 cycles Administration: 0.25 mg (08/24/2018), 0.25 mg (08/30/2018), 0.25 mg (09/06/2018), 0.25 mg (09/13/2018) CARBOplatin (PARAPLATIN) 210 mg in sodium chloride 0.9 % 250 mL chemo infusion, 210 mg (100 % of original dose 205 mg), Intravenous,  Once, 4 of 5 cycles Dose modification: 205 mg (original dose 205 mg, Cycle 1) Administration: 210 mg (08/24/2018), 210 mg (08/30/2018), 210 mg (09/06/2018), 210 mg (09/13/2018) PACLitaxel (TAXOL) 90 mg in sodium chloride 0.9 % 250 mL chemo infusion (</= 80mg /m2), 45 mg/m2 = 90 mg, Intravenous,  Once, 4 of 5 cycles Administration: 90 mg (08/24/2018), 90 mg (08/30/2018), 90 mg (09/06/2018), 90 mg (09/13/2018)  for chemotherapy treatment.    10/27/2018 -  Chemotherapy   The patient had palonosetron (ALOXI) injection 0.25 mg, 0.25 mg, Intravenous,  Once, 4 of 4 cycles Administration: 0.25 mg (10/27/2018), 0.25 mg (12/08/2018), 0.25 mg (11/17/2018) PEMEtrexed (ALIMTA) 1,000 mg in sodium chloride 0.9 % 100 mL chemo infusion, 975 mg, Intravenous,  Once, 4 of 12 cycles Administration: 1,000 mg (10/27/2018), 1,000 mg (12/08/2018), 1,000 mg (01/19/2019), 1,000 mg (11/17/2018) CARBOplatin (PARAPLATIN) 520 mg in sodium chloride 0.9 % 250 mL chemo infusion, 520 mg (100 % of original dose 515.5 mg), Intravenous,  Once, 4 of 4 cycles Dose modification: 515.5 mg (original dose 515.5 mg, Cycle 1), 519.5 mg (original dose 519.5 mg, Cycle 3), 415.6 mg (original dose 519.5 mg, Cycle 3, Reason: Dose not  tolerated), 348.4 mg (original dose 348.4 mg, Cycle 4), 271.6 mg (original dose 348.4 mg, Cycle 4, Reason: Provider Judgment), 519.5 mg (original dose 519.5 mg, Cycle 2) Administration: 520 mg (10/27/2018), 420 mg (12/08/2018), 270 mg (12/29/2018), 520 mg (11/17/2018) pembrolizumab (KEYTRUDA) 200 mg in sodium chloride 0.9 % 50 mL chemo infusion, 200 mg, Intravenous, Once, 5 of 13 cycles Administration: 200 mg (10/27/2018), 200 mg (12/08/2018), 200 mg (12/29/2018), 200 mg (01/19/2019), 200 mg (11/17/2018) fosaprepitant (EMEND) 150 mg, dexamethasone (DECADRON) 12 mg in sodium chloride 0.9 % 145 mL IVPB, , Intravenous,  Once, 4 of 4 cycles Administration:  (10/27/2018),  (12/08/2018),  (12/29/2018),  (11/17/2018)  for chemotherapy treatment.       REVIEW OF SYSTEMS:   Constitutional: Denies fevers, chills Eyes: Denies blurriness of vision Ears, nose, mouth, throat, and face: Denies mucositis or sore throat Respiratory: Denies cough, dyspnea or wheezes Cardiovascular: Denies chest pain Gastrointestinal:  Denies nausea, heartburn or change in bowel habits Skin: Denies abnormal skin rashes Lymphatics: Denies new lymphadenopathy or easy bruising Neurological:Denies numbness, tingling or new weaknesses Behavioral/Psych: Mood is stable, no new changes  Extremities: No lower extremity edema All other systems were reviewed with the patient and are negative.  I have reviewed the past medical history, past surgical history, social history and family history with the patient and they are unchanged from previous note.   PHYSICAL EXAMINATION: ECOG PERFORMANCE STATUS: 1 - Symptomatic but completely ambulatory  Vitals:   02/03/19 1056 02/03/19 1202  BP: (!) 136/94 (!) 128/91  Pulse: (!) 112 (!) 106  Resp:  18  Temp:  Marland Kitchen)  97.5 F (36.4 C)  SpO2:  99%   Filed Weights   02/01/19 0425 02/02/19 0542 02/03/19 0500  Weight: 158 lb 15.2 oz (72.1 kg) 157 lb 3 oz (71.3 kg) 156 lb 12 oz (71.1 kg)    Intake/Output  from previous day: 09/30 0701 - 10/01 0700 In: 1650.8 [P.O.:630; Blood:320.8; IV Piggyback:700] Out: 1825 [Urine:1825]  GENERAL:alert, no distress and comfortable SKIN: skin color, texture, turgor are normal, no rashes or significant lesions EYES: normal, Conjunctiva are pink and non-injected, sclera clear OROPHARYNX:no exudate, no erythema and lips, buccal mucosa, and tongue normal  NECK: supple, thyroid normal size, non-tender, without nodularity LYMPH:  no palpable lymphadenopathy in the cervical, axillary or inguinal LUNGS: clear to auscultation and percussion with normal breathing effort HEART: regular rate & rhythm and no murmurs and trace lower extremity edema ABDOMEN:abdomen soft, non-tender and normal bowel sounds Musculoskeletal:no cyanosis of digits and no clubbing  NEURO: alert & oriented x 3 with fluent speech, no focal motor/sensory deficits  LABORATORY DATA:  I have reviewed the data as listed CMP Latest Ref Rng & Units 02/03/2019 02/01/2019 01/31/2019  Glucose 70 - 99 mg/dL 129(H) 113(H) 143(H)  BUN 8 - 23 mg/dL 30(H) 25(H) 28(H)  Creatinine 0.61 - 1.24 mg/dL 1.41(H) 1.66(H) 1.72(H)  Sodium 135 - 145 mmol/L 143 139 137  Potassium 3.5 - 5.1 mmol/L 3.2(L) 3.8 4.0  Chloride 98 - 111 mmol/L 114(H) 110 109  CO2 22 - 32 mmol/L 17(L) 15(L) 15(L)  Calcium 8.9 - 10.3 mg/dL 8.6(L) 8.5(L) 8.5(L)  Total Protein 6.5 - 8.1 g/dL 5.8(L) 5.7(L) 5.6(L)  Total Bilirubin 0.3 - 1.2 mg/dL 2.1(H) 2.0(H) 1.4(H)  Alkaline Phos 38 - 126 U/L 81 65 64  AST 15 - 41 U/L 145(H) 94(H) 40  ALT 0 - 44 U/L 89(H) 46(H) 22    Lab Results  Component Value Date   WBC 0.4 (LL) 02/03/2019   HGB 8.6 (L) 02/03/2019   HCT 25.5 (L) 02/03/2019   MCV 93.1 02/03/2019   PLT 12 (LL) 02/03/2019   NEUTROABS 0.1 (L) 02/02/2019    US Renal  Result Date: 01/29/2019 CLINICAL DATA:  Renal failure EXAM: RENAL / URINARY TRACT ULTRASOUND COMPLETE COMPARISON:  CT scan June 08, 2017 FINDINGS: Right Kidney: Renal  measurements: 11.4 x 5.6 x 5.2 cm = volume: 173.7 mL . Echogenicity within normal limits. No mass or hydronephrosis visualized. Left Kidney: Renal measurements: 11.6 x 6.2 x 5.7 cm = volume: 213 mL. Contains a 1.7 cm cyst seen on previous CT imaging. Bladder: Appears normal for degree of bladder distention. IMPRESSION: 1. No cause for the patient's renal failure identified. A single left-sided renal cyst is noted, better assessed on previous CT imaging. Electronically Signed   By: Dorise Bullion III M.D   On: 01/16/2019 14:32   Dg Chest Port 1 View  Result Date: 01/04/2019 CLINICAL DATA:  Hypertension, lung cancer, diabetes mellitus, hypertension EXAM: PORTABLE CHEST 1 VIEW COMPARISON:  Portable exam 1343 hours compared to 12/09/2018 FINDINGS: Volume loss in the RIGHT hemithorax 6 with mild mediastinal shift from LEFT to RIGHT. RIGHT jugular Port-A-Cath with tip projecting over RIGHT atrium. Normal heart size and pulmonary vascularity. Mild patchy RIGHT perihilar and RIGHT upper lobe infiltrates. LEFT lung clear. Elevation of RIGHT diaphragm. No pleural effusion or pneumothorax. IMPRESSION: Patchy RIGHT perihilar and RIGHT upper lobe infiltrates. LEFT lung clear. Electronically Signed   By: Lavonia Dana M.D.   On: 01/05/2019 14:20   Dg Abd Portable 1v  Result Date:  01/30/2019 CLINICAL DATA:  Abdominal pain and constipation. EXAM: PORTABLE ABDOMEN - 1 VIEW COMPARISON:  None. FINDINGS: The bowel gas pattern is nonobstructive. The amount of fecal material in the colon is within normal limits. No other acute abnormalities identified. IMPRESSION: No evidence of constipation.  Nonobstructive bowel gas pattern. Electronically Signed   By: Dorise Bullion III M.D   On: 01/30/2019 15:56    ASSESSMENT AND PLAN: 1.  Stage IV non-small cell lung cancer, adenocarcinoma 2.  Pancytopenia secondary to recent chemotherapy and sepsis 3.  Pneumonia 4.  AKI on CKD 5.  Hypertension 6.  Diabetes  -Continue Granix 480  mcg daily.  White blood cell count remains low, but starting to see a slight increase.  The patient may discharge to home when otherwise medically stable and if [place] is 500 or higher. -Hemoglobin has improved after receiving 1 unit packed red blood cells on 02/02/2019.  No PRBC transfusion indicated today.  Platelets are 12,000 and I will give 1 unit of platelets today. -Antibiotics have now been stopped.  I am hopeful this will also give his bone marrow opportunity to recover. -Renal function continues to improve. Will need to reevaluate chemotherapy medications as an outpatient.  Alimta can cause renal insufficiency and we may need to discontinue this.  Will defer this discussion until next follow-up with medical oncology. -Continue metoprolol per hospitalist. -Insulin per hospitalist.   LOS: 6 days   Mikey Bussing, DNP, AGPCNP-BC, AOCNP 02/03/19

## 2019-02-03 NOTE — Progress Notes (Signed)
Pt complained of 8/10 non-radiating, mid-sternal chest pain. Pt described pain as "chest pressure". Pt anxious trying to get out of bed. EKG obtained, vital signs obtained, MD notified and orders placed for 1x dose of IV morphine and PRN sublingual nitroglycerin. Morphine given as well as two sublingual tabs. Pt BP dropped to 86/69 after second nitro given. MD called to bedside. Verbal orders given to monitor BP closely. BP obtained 85min later reads 106/81. Pt states that he is no longer in pain and would like to rest.      Will continue to monitor pt.

## 2019-02-03 NOTE — Progress Notes (Signed)
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 intake, some cough and shortness of breath.  CXR revealed increased infiltrate on the right side. Admitted for sepsis due to pneumonia, AKI and metabolic acidosis.  He was treated with cefepime and azithromycin for 7 days.  Renal function and metabolic acidosis improved with hydration. Patient also had pancytopenia likely due to recent chemotherapy and sepsis.  He was started on Granix by oncology.  Also received platelet transfusions.  Subjective: No major events overnight of this morning.  He is a sleepy but arises to voice easily.  Oriented to self and place only.  No complaints.  He denies chest pain, dyspnea or abdominal pain.  Does not appear to be in distress.  Objective: Vitals:   02/02/19 2112 02/03/19 0500 02/03/19 0529 02/03/19 1056  BP: 134/90  116/65 (!) 136/94  Pulse: (!) 108  95 (!) 112  Resp: 17  18   Temp: 97.7 F (36.5 C)  97.7 F (36.5 C)   TempSrc: Oral  Oral   SpO2: 100%  100%   Weight:  71.1 kg    Height:        Intake/Output Summary (Last 24 hours) at 02/03/2019 1122 Last data filed at 02/03/2019 0600 Gross per 24 hour  Intake 1410.83 ml  Output 1825 ml  Net -414.17 ml   Filed Weights   02/01/19 0425 02/02/19 0542 02/03/19 0500  Weight: 72.1 kg 71.3 kg 71.1 kg    Examination:  GENERAL: No acute distress.  Chronically ill-appearing. HEENT: MMM.  Vision and hearing grossly intact.  NECK: Supple.  No apparent JVD.  RESP:  No IWOB. Good air movement bilaterally. CVS: Slightly tachycardic. Heart sounds normal.  ABD/GI/GU: Bowel sounds present. Soft. Non tender.  MSK/EXT:  Moves  extremities. No apparent deformity or edema.  SKIN: no apparent skin lesion or wound NEURO: Awake, alert and oriented to self and place.  Not coherent.  No gross deficit.  PSYCH: Calm. Normal affect.   Assessment & Plan: Sepsis due to pneumonia in a patient with neutropenia: POA.  Sepsis physiology resolved. -Initial CXR with increased infiltrate on the right side. -Completed 7 days of IV cefepime and azithromycin.  Stage IV non-small cell lung cancer with bone metastasis Pancytopenia-neutropenia -WBC slightly improved from 0.2-0.4. -Hgb stable at 7.1>1u>8.6 -Platelet 11k> 1 apheresis>20k >12k -Granix per oncology -Monitor CBC.  Acute kidney injury on CKD-3/hyperkalemia/metabolic acidosis-renal ultrasound negative for hydronephrosis. -AKI and metabolic acidosis improved.   -Hyperkalemia resolved -Recheck renal function in the morning  Elevated liver enzymes: pattern consistent with rhabdo. -Check CK  IDDM-2 with renal complication: CBG was in fair range. -Continue current regimen  Essential hypertension: Normotensive. -Continue metoprolol.  Chronic pain -Continue as needed pain medications  Pressure ulcer: Stage II sacral wound.  POA. Pressure Injury 01/04/2019 Sacrum Posterior;Mid Stage II - Partial thickness loss of dermis presenting as a shallow open ulcer with a red, pink wound bed without slough. (Active)  01/13/2019 2000  Location: Sacrum  Location Orientation: Posterior;Mid  Staging: Stage II - Partial thickness loss of dermis presenting as a shallow open ulcer with a red, pink wound bed without slough.    DVT prophylaxis: SCD due to thrombocytopenia Code Status: Full code  Family Communication: Patient and/or RN. Available if any question. Disposition Plan: Remains inpatient pending clearance by oncology.  Patient with severe leukopenia/neutropenia.  Consultants: Oncology  Procedures:  None  Microbiology summarized: UXLKG-40 negative. Blood culture with  coag negative staph in 1 of the 2 bottles  Antimicrobials: Anti-infectives (From admission, onward)   Start     Dose/Rate Route Frequency Ordered Stop   01/31/19 1200  ceFEPIme (MAXIPIME) 2 g in sodium chloride 0.9 % 100 mL IVPB  Status:  Discontinued     2 g 200 mL/hr over 30 Minutes Intravenous Every 12 hours 01/31/19 1058 02/03/19 0928   01/30/19 1400  vancomycin (VANCOCIN) IVPB 1000 mg/200 mL premix  Status:  Discontinued     1,000 mg 200 mL/hr over 60 Minutes Intravenous Every 48 hours 01/31/2019 1935 01/29/19 1222   01/29/19 1600  ceFEPIme (MAXIPIME) 2 g in sodium chloride 0.9 % 100 mL IVPB  Status:  Discontinued     2 g 200 mL/hr over 30 Minutes Intravenous Every 24 hours 02/01/2019 1932 01/31/19 1058   01/19/2019 2000  azithromycin (ZITHROMAX) 500 mg in sodium chloride 0.9 % 250 mL IVPB  Status:  Discontinued     500 mg 250 mL/hr over 60 Minutes Intravenous Every 24 hours 01/31/2019 1928 02/03/19 0928   01/26/2019 1500  ceFEPIme (MAXIPIME) 2 g in sodium chloride 0.9 % 100 mL IVPB     2 g 200 mL/hr over 30 Minutes Intravenous STAT 01/20/2019 1446 01/08/2019 1910   01/06/2019 1500  vancomycin (VANCOCIN) 1,500 mg in sodium chloride 0.9 % 500 mL IVPB     1,500 mg 250 mL/hr over 120 Minutes Intravenous STAT 01/08/2019 1446 01/26/2019 1719      Sch Meds:  Scheduled Meds: . sodium chloride   Intravenous Once  . acetaminophen  650 mg Oral Once  . Chlorhexidine Gluconate Cloth  6 each Topical Daily  . chlorproMAZINE  25 mg Oral TID  . diphenhydrAMINE  25 mg Oral Once  . docusate sodium  100 mg Oral BID  . feeding supplement (ENSURE ENLIVE)  237 mL Oral BID BM  . insulin aspart  0-5 Units Subcutaneous QHS  . insulin aspart  0-9 Units Subcutaneous TID WC  . insulin aspart  3 Units Subcutaneous TID WC  . levothyroxine  100 mcg Oral Daily  . metoprolol tartrate  50 mg Oral BID  . mirtazapine  7.5 mg Oral QHS  . pantoprazole  40 mg Oral BID  . potassium chloride  40 mEq Oral Q4H  . sodium chloride  flush  10-40 mL Intracatheter Q12H  . Tbo-filgastrim (GRANIX) SQ  480 mcg Subcutaneous q1800   Continuous Infusions: . magnesium sulfate bolus IVPB 2 g (02/03/19 1100)   PRN Meds:.acetaminophen **OR** acetaminophen, chlorpheniramine-HYDROcodone, metoprolol tartrate, ondansetron **OR** ondansetron (ZOFRAN) IV, polyethylene glycol, sodium chloride flush   I have personally reviewed the following labs and images: CBC: Recent Labs  Lab 01/23/2019 1140 01/12/2019 1305  01/30/19 0500 01/31/19 0645 02/01/19 0404 02/02/19 0350 02/03/19 0425  WBC 0.4* 0.3*   < > 0.2* 0.2* 0.2* 0.2* 0.4*  NEUTROABS 0.2* 0.2*  --  0.0*  --   --  0.1*  --   HGB 7.7* 7.4*   < > 7.6* 7.2* 7.4* 7.1* 8.6*  HCT 24.3* 22.9*   < > 23.1* 21.9* 22.1* 21.7* 25.5*  MCV 101.3* 102.7*   < > 96.7 97.3 94.8 95.2 93.1  PLT 14* 12*   < > 30* 16* 11* 20* 12*   < > =  values in this interval not displayed.   BMP &GFR Recent Labs  Lab 01/29/19 0330 01/30/19 0500 01/31/19 0645 02/01/19 0404 02/03/19 0425  NA 137 141 137 139 143  K 5.0 5.6* 4.0 3.8 3.2*  CL 112* 113* 109 110 114*  CO2 15* 16* 15* 15* 17*  GLUCOSE 111* 149* 143* 113* 129*  BUN 50* 39* 28* 25* 30*  CREATININE 3.10* 2.28* 1.72* 1.66* 1.41*  CALCIUM 7.7* 6.3* 8.5* 8.5* 8.6*  MG  --  0.9* 2.2  --  1.6*  PHOS  --   --   --   --  3.4   Estimated Creatinine Clearance: 50.1 mL/min (A) (by C-G formula based on SCr of 1.41 mg/dL (H)). Liver & Pancreas: Recent Labs  Lab 01/29/19 0330 01/30/19 0500 01/31/19 0645 02/01/19 0404 02/03/19 0425  AST 23 25 40 94* 145*  ALT 14 15 22  46* 89*  ALKPHOS 51 60 64 65 81  BILITOT 1.6* 1.4* 1.4* 2.0* 2.1*  PROT 5.4* 5.6* 5.6* 5.7* 5.8*  ALBUMIN 2.2* 2.1* 2.1* 2.0* 2.0*   No results for input(s): LIPASE, AMYLASE in the last 168 hours. No results for input(s): AMMONIA in the last 168 hours. Diabetic: No results for input(s): HGBA1C in the last 72 hours. Recent Labs  Lab 02/02/19 0731 02/02/19 1100 02/02/19 1644  02/02/19 2108 02/03/19 0735  GLUCAP 109* 127* 75 94 119*   Cardiac Enzymes: Recent Labs  Lab 02/03/19 1033  CKTOTAL 88   No results for input(s): PROBNP in the last 8760 hours. Coagulation Profile: No results for input(s): INR, PROTIME in the last 168 hours. Thyroid Function Tests: No results for input(s): TSH, T4TOTAL, FREET4, T3FREE, THYROIDAB in the last 72 hours. Lipid Profile: No results for input(s): CHOL, HDL, LDLCALC, TRIG, CHOLHDL, LDLDIRECT in the last 72 hours. Anemia Panel: No results for input(s): VITAMINB12, FOLATE, FERRITIN, TIBC, IRON, RETICCTPCT in the last 72 hours. Urine analysis:    Component Value Date/Time   COLORURINE YELLOW 01/07/2019 1800   APPEARANCEUR HAZY (A) 01/26/2019 1800   LABSPEC 1.010 01/24/2019 1800   PHURINE 5.0 01/13/2019 1800   GLUCOSEU NEGATIVE 01/07/2019 1800   HGBUR NEGATIVE 01/09/2019 1800   BILIRUBINUR NEGATIVE 01/31/2019 1800   KETONESUR NEGATIVE 01/16/2019 1800   PROTEINUR NEGATIVE 01/21/2019 1800   UROBILINOGEN 1.0 12/16/2008 0451   NITRITE NEGATIVE 01/24/2019 1800   LEUKOCYTESUR NEGATIVE 02/01/2019 1800   Sepsis Labs: Invalid input(s): PROCALCITONIN, [place]  Microbiology: Recent Results (from the past 240 hour(s))  SARS Coronavirus 2 [facility] order, Performed in [facility] hospital lab) Nasopharyngeal Nasopharyngeal Swab     Status: None   Collection Time: 01/09/2019  1:06 PM   Specimen: Nasopharyngeal Swab  Result Value Ref Range Status   SARS Coronavirus 2 NEGATIVE NEGATIVE Final    Comment: (NOTE) If result is NEGATIVE SARS-CoV-2 target nucleic acids are NOT DETECTED. The SARS-CoV-2 RNA is generally detectable in upper and lower  respiratory specimens during the acute phase of infection. The lowest  concentration of SARS-CoV-2 viral copies this assay can detect is 250  copies / mL. A negative result does not preclude SARS-CoV-2 infection  and should not be used as the sole basis for treatment or other   patient management decisions.  A negative result may occur with  improper specimen collection / handling, submission of specimen other  than nasopharyngeal swab, presence of viral mutation(s) within the  areas targeted by this assay, and inadequate number of viral copies  (<250 copies / mL). A negative  result must be combined with clinical  observations, patient history, and epidemiological information. If result is POSITIVE SARS-CoV-2 target nucleic acids are DETECTED. The SARS-CoV-2 RNA is generally detectable in upper and lower  respiratory specimens dur ing the acute phase of infection.  Positive  results are indicative of active infection with SARS-CoV-2.  Clinical  correlation with patient history and other diagnostic information is  necessary to determine patient infection status.  Positive results do  not rule out bacterial infection or co-infection with other viruses. If result is PRESUMPTIVE POSTIVE SARS-CoV-2 nucleic acids MAY BE PRESENT.   A presumptive positive result was obtained on the submitted specimen  and confirmed on repeat testing.  While 2019 novel coronavirus  (SARS-CoV-2) nucleic acids may be present in the submitted sample  additional confirmatory testing may be necessary for epidemiological  and / or clinical management purposes  to differentiate between  SARS-CoV-2 and other Sarbecovirus currently known to infect humans.  If clinically indicated additional testing with an alternate test  methodology [id number]) is advised. The SARS-CoV-2 RNA is generally  detectable in upper and lower respiratory sp ecimens during the acute  phase of infection. The expected result is Negative. Fact Sheet for Patients:  StrictlyIdeas.no Fact Sheet for Healthcare Providers: BankingDealers.co.za This test is not yet approved or cleared by the [place] FDA and has been authorized for detection and/or diagnosis of SARS-CoV-2 by  FDA under an Emergency Use Authorization (EUA).  This EUA will remain in effect (meaning this test can be used) for the duration of the COVID-19 declaration under Section 564(b)(1) of the Act, 21 U.S.C. section 360bbb-3(b)(1), unless the authorization is terminated or revoked sooner. Performed at [facility], [place] [address] [place], [place] [address]   Blood culture (routine x 2)     Status: Abnormal   Collection Time: 01/27/2019  1:37 PM   Specimen: BLOOD  Result Value Ref Range Status   Specimen Description   Final    BLOOD PORTA CATH Performed at [place] [address], [place], [place] [address]    Special Requests   Final    BOTTLES DRAWN AEROBIC AND ANAEROBIC Blood Culture adequate volume Performed at [facility] [address], [place], [place] [address]    Culture  Setup Time   Final    GRAM POSITIVE COCCI AEROBIC BOTTLE ONLY CRITICAL RESULT CALLED TO, READ BACK BY AND VERIFIED WITH: PHARMD M SWAAYNE [id number] AT 1217 BY CM GRAM POSITIVE RODS ANAEROBIC BOTTLE ONLY CRITICAL RESULT CALLED TO, READ BACK BY AND VERIFIED WITH: M. Erskin Burnet [id number] 01/30/2019 T. TYSOR    Culture (A)  Final    STAPHYLOCOCCUS SPECIES (COAGULASE NEGATIVE) THE SIGNIFICANCE OF ISOLATING THIS ORGANISM FROM A SINGLE VENIPUNCTURE CANNOT BE PREDICTED WITHOUT FURTHER CLINICAL AND CULTURE CORRELATION. SUSCEPTIBILITIES AVAILABLE ONLY ON REQUEST. DIPHTHEROIDS(CORYNEBACTERIUM SPECIES) Standardized susceptibility testing for this organism is not available. Performed at [facility], [place] [address], [place], [place] [address]    Report Status 02/03/2019 FINAL  Final  Blood Culture ID Panel (Reflexed)     Status: Abnormal   Collection Time: 01/05/2019  1:37 PM  Result Value Ref Range Status   Enterococcus species NOT DETECTED NOT DETECTED Final   Listeria monocytogenes NOT DETECTED NOT DETECTED Final   Staphylococcus species DETECTED (A)  NOT DETECTED Final    Comment: Methicillin (oxacillin) susceptible coagulase negative staphylococcus. Possible blood culture contaminant (unless isolated from more than one blood culture draw or clinical case suggests  pathogenicity). No antibiotic treatment is indicated for blood  culture contaminants. CRITICAL RESULT CALLED TO, READ BACK BY AND VERIFIED WITH: PHARMD Shelda Jakes [id number] AT 1217 BY CM    Staphylococcus aureus (BCID) NOT DETECTED NOT DETECTED Final   Methicillin resistance NOT DETECTED NOT DETECTED Final   Streptococcus species NOT DETECTED NOT DETECTED Final   Streptococcus agalactiae NOT DETECTED NOT DETECTED Final   Streptococcus pneumoniae NOT DETECTED NOT DETECTED Final   Streptococcus pyogenes NOT DETECTED NOT DETECTED Final   Acinetobacter baumannii NOT DETECTED NOT DETECTED Final   Enterobacteriaceae species NOT DETECTED NOT DETECTED Final   Enterobacter cloacae complex NOT DETECTED NOT DETECTED Final   Escherichia coli NOT DETECTED NOT DETECTED Final   Klebsiella oxytoca NOT DETECTED NOT DETECTED Final   Klebsiella pneumoniae NOT DETECTED NOT DETECTED Final   Proteus species NOT DETECTED NOT DETECTED Final   Serratia marcescens NOT DETECTED NOT DETECTED Final   Haemophilus influenzae NOT DETECTED NOT DETECTED Final   Neisseria meningitidis NOT DETECTED NOT DETECTED Final   Pseudomonas aeruginosa NOT DETECTED NOT DETECTED Final   Candida albicans NOT DETECTED NOT DETECTED Final   Candida glabrata NOT DETECTED NOT DETECTED Final   Candida krusei NOT DETECTED NOT DETECTED Final   Candida parapsilosis NOT DETECTED NOT DETECTED Final   Candida tropicalis NOT DETECTED NOT DETECTED Final    Comment: Performed at [facility], [address] [place], [place] [address]  Urine culture     Status: None   Collection Time: 01/18/2019  6:00 PM   Specimen: Urine, Clean Catch  Result Value Ref Range Status   Specimen Description   Final    URINE, CLEAN CATCH Performed at  [facility], [place] [address] [place], [place] [address]    Special Requests   Final    NONE Performed at [facility], [place] [address], [place], [place] [address]    Culture   Final    NO GROWTH Performed at [facility], [place] [address], [place], [place] [address]    Report Status 01/30/2019 FINAL  Final  MRSA PCR Screening     Status: None   Collection Time: 01/16/2019  7:39 PM   Specimen: Nasopharyngeal  Result Value Ref Range Status   MRSA by PCR NEGATIVE NEGATIVE Final    Comment:        The GeneXpert MRSA Assay (FDA approved for NASAL specimens only), is one component of a comprehensive MRSA colonization surveillance program. It is not intended to diagnose MRSA infection nor to guide or monitor treatment for MRSA infections. Performed at [facility], [place] [address] [place], [place] [address]     Radiology Studies: No results found.    T. [place]  If 7PM-7AM, please contact night-coverage [email or web address] Password [email or web address] 02/03/2019, 11:22 AM

## 2019-02-03 NOTE — Progress Notes (Signed)
Updated patient's wife over the phone. Also discussed about code status. She states that they have discussed about this previously and he is DNR/DNI, which I believe is appropriate given his comorbidity and frailty.

## 2019-02-03 DEATH — deceased

## 2019-02-04 ENCOUNTER — Inpatient Hospital Stay (HOSPITAL_COMMUNITY): Payer: Medicare Other

## 2019-02-04 DIAGNOSIS — R0602 Shortness of breath: Secondary | ICD-10-CM

## 2019-02-04 DIAGNOSIS — R6520 Severe sepsis without septic shock: Secondary | ICD-10-CM

## 2019-02-04 DIAGNOSIS — E870 Hyperosmolality and hypernatremia: Secondary | ICD-10-CM

## 2019-02-04 LAB — COMPREHENSIVE METABOLIC PANEL
ALT: 207 U/L — ABNORMAL HIGH (ref 0–44)
AST: 366 U/L — ABNORMAL HIGH (ref 15–41)
Albumin: 1.9 g/dL — ABNORMAL LOW (ref 3.5–5.0)
Alkaline Phosphatase: 101 U/L (ref 38–126)
Anion gap: 10 (ref 5–15)
BUN: 33 mg/dL — ABNORMAL HIGH (ref 8–23)
CO2: 18 mmol/L — ABNORMAL LOW (ref 22–32)
Calcium: 8.7 mg/dL — ABNORMAL LOW (ref 8.9–10.3)
Chloride: 119 mmol/L — ABNORMAL HIGH (ref 98–111)
Creatinine, Ser: 1.49 mg/dL — ABNORMAL HIGH (ref 0.61–1.24)
GFR calc Af Amer: 55 mL/min — ABNORMAL LOW (ref >60)
GFR calc non Af Amer: 48 mL/min — ABNORMAL LOW (ref >60)
Glucose, Bld: 172 mg/dL — ABNORMAL HIGH (ref 70–99)
Potassium: 3.5 mmol/L (ref 3.5–5.1)
Sodium: 147 mmol/L — ABNORMAL HIGH (ref 135–145)
Total Bilirubin: 2.8 mg/dL — ABNORMAL HIGH (ref 0.3–1.2)
Total Protein: 5.9 g/dL — ABNORMAL LOW (ref 6.5–8.1)

## 2019-02-04 LAB — CBC WITH DIFFERENTIAL/PLATELET
Abs Immature Granulocytes: 0.03 10*3/uL (ref 0.00–0.07)
Basophils Absolute: 0 10*3/uL (ref 0.0–0.1)
Basophils Relative: 0 %
Eosinophils Absolute: 0 10*3/uL (ref 0.0–0.5)
Eosinophils Relative: 2 %
HCT: 25.9 % — ABNORMAL LOW (ref 39.0–52.0)
Hemoglobin: 8.6 g/dL — ABNORMAL LOW (ref 13.0–17.0)
Immature Granulocytes: 6 %
Lymphocytes Relative: 17 %
Lymphs Abs: 0.1 10*3/uL — ABNORMAL LOW (ref 0.7–4.0)
MCH: 31 pg (ref 26.0–34.0)
MCHC: 33.2 g/dL (ref 30.0–36.0)
MCV: 93.5 fL (ref 80.0–100.0)
Monocytes Absolute: 0.1 10*3/uL (ref 0.1–1.0)
Monocytes Relative: 13 %
Neutro Abs: 0.3 10*3/uL — ABNORMAL LOW (ref 1.7–7.7)
Neutrophils Relative %: 62 %
Platelets: 28 10*3/uL — CL (ref 150–400)
RBC: 2.77 MIL/uL — ABNORMAL LOW (ref 4.22–5.81)
RDW: 17.3 % — ABNORMAL HIGH (ref 11.5–15.5)
WBC: 0.5 10*3/uL — CL (ref 4.0–10.5)
nRBC: 0 % (ref 0.0–0.2)

## 2019-02-04 LAB — TSH: TSH: 3.902 u[IU]/mL (ref 0.350–4.500)

## 2019-02-04 LAB — GLUCOSE, CAPILLARY
Glucose-Capillary: 149 mg/dL — ABNORMAL HIGH (ref 70–99)
Glucose-Capillary: 155 mg/dL — ABNORMAL HIGH (ref 70–99)
Glucose-Capillary: 165 mg/dL — ABNORMAL HIGH (ref 70–99)
Glucose-Capillary: 134 mg/dL — ABNORMAL HIGH (ref 70–99)

## 2019-02-04 LAB — URINALYSIS, ROUTINE W REFLEX MICROSCOPIC
Bilirubin Urine: NEGATIVE
Glucose, UA: NEGATIVE mg/dL
Ketones, ur: 5 mg/dL — AB
Leukocytes,Ua: NEGATIVE
Nitrite: NEGATIVE
Protein, ur: NEGATIVE mg/dL
Specific Gravity, Urine: 1.006 (ref 1.005–1.030)
pH: 6 (ref 5.0–8.0)

## 2019-02-04 LAB — ECHOCARDIOGRAM COMPLETE
Height: 69 in
Weight: 2479.73 oz

## 2019-02-04 LAB — BPAM PLATELET PHERESIS
Blood Product Expiration Date: 202010022359
ISSUE DATE / TIME: 202010011208
Unit Type and Rh: 7300

## 2019-02-04 LAB — PREPARE PLATELET PHERESIS: Unit division: 0

## 2019-02-04 LAB — MAGNESIUM: Magnesium: 2 mg/dL (ref 1.7–2.4)

## 2019-02-04 LAB — AMMONIA: Ammonia: 31 umol/L (ref 9–35)

## 2019-02-04 MED ORDER — SODIUM CHLORIDE 0.45 % IV SOLN
INTRAVENOUS | Status: DC
  Administered 2019-02-04 – 2019-02-05 (×2): via INTRAVENOUS

## 2019-02-04 MED ORDER — VANCOMYCIN HCL IN DEXTROSE 1-5 GM/200ML-% IV SOLN
1000.0000 mg | INTRAVENOUS | Status: DC
  Administered 2019-02-05: 10:00:00 1000 mg via INTRAVENOUS
  Filled 2019-02-04: qty 200

## 2019-02-04 MED ORDER — VANCOMYCIN HCL 10 G IV SOLR
1500.0000 mg | Freq: Once | INTRAVENOUS | Status: AC
  Administered 2019-02-04: 1500 mg via INTRAVENOUS
  Filled 2019-02-04: qty 1500

## 2019-02-04 MED ORDER — SODIUM CHLORIDE 0.9 % IV SOLN
2.0000 g | Freq: Two times a day (BID) | INTRAVENOUS | Status: DC
  Administered 2019-02-04 – 2019-02-05 (×3): 2 g via INTRAVENOUS
  Filled 2019-02-04 (×4): qty 2

## 2019-02-04 MED ORDER — LABETALOL HCL 5 MG/ML IV SOLN
10.0000 mg | INTRAVENOUS | Status: DC | PRN
  Administered 2019-02-04 – 2019-02-05 (×2): 10 mg via INTRAVENOUS
  Filled 2019-02-04 (×2): qty 4

## 2019-02-04 NOTE — Progress Notes (Signed)
Echocardiogram 2D Echocardiogram has been performed.  Oneal Deputy Ankush Gintz 02/04/2019, 11:30 AM

## 2019-02-04 NOTE — Progress Notes (Signed)
Pharmacy Antibiotic Note  Zachary Knox is a 68 y.o. male with metastatic state IV NSCL on chemotherapy treatment PTA, presented to the ED on  01/04/2019 with c/o weakness.  Pharmacy is familiar with patient from abx consults. Abx for sepsis/PNA were d/ced on 10/1. Per Dr. Juliann Pares request on 10/2, continue with cefepime and resume vancomycin.  Antimicrobials this admission: 9/25 cefepime>10/1 >> resumed 10/2>> 9/25 vanc> 9/26>> resumed on 10/2>> 9/25 azith>>10/1  Microbiology results: 9/25 UCx: ng-final 9/25 BCx1 from [facility] (no peripheral cx): CoNS 1/2 GPR in aerobic bottle: both likely colonizing Port 9/25 MRSA PCR: neg   Today, 02/04/2019: - T 95.4 this morning - wbc low - scr up slightly 1.49 (crcl~47), BL scr 1.7  Plan: - vancomycin 1500 mg IV x1, then 1000 mg IV q24h for est AUC 457 - cefepime 2gm IV q12h -monitor renal function and clinical status _____________________________________________  Height: 5\' 9"  (175.3 cm) Weight: 154 lb 15.7 oz (70.3 kg) IBW/kg (Calculated) : 70.7  Temp (24hrs), Avg:96.7 F (35.9 C), Min:95.1 F (35.1 C), Max:98.7 F (37.1 C)  Recent Labs  Lab 01/16/2019 1305 01/10/2019 1527  01/30/19 0500 01/31/19 0645 02/01/19 0404 02/02/19 0350 02/03/19 0425 02/04/19 0458  WBC 0.3*  --    < > 0.2* 0.2* 0.2* 0.2* 0.4* 0.5*  CREATININE 3.66*  --    < > 2.28* 1.72* 1.66*  --  1.41* 1.49*  LATICACIDVEN 2.1* 3.6*  --  1.4  --   --   --   --   --    < > = values in this interval not displayed.    Estimated Creatinine Clearance: 47.2 mL/min (A) (by C-G formula based on SCr of 1.49 mg/dL (H)).    No Known Allergies   Thank you for allowing pharmacy to be a part of this patient's care.  Lynelle Doctor 02/04/2019 8:31 AM

## 2019-02-04 NOTE — Progress Notes (Signed)
Brief oncology note:  Events overnight and this morning had been noted.  The patient seems more fatigued and confused this morning when I visited him.  He was really unable to provide any ROS.  Nursing has not noted any bleeding.  Note pending transfer to stepdown unit.  I have reviewed his labs morning.  He remains neutropenic with a slight increase in his total white blood cell count to 0.5.  His hemoglobin is stable at 8.6.  Platelets are improved to 28,000 this morning as he received 1 unit of platelets on 02/03/2019.  Recommend continuation of Granix 480 mcg daily until [place] is above 1.5 in the setting of infection.  No transfusion is indicated today.  Given his diagnosis stage IV lung cancer, agree with DNR/DNI.  Mikey Bussing, DNP, AGPCNP-BC, AOCNP

## 2019-02-04 NOTE — Progress Notes (Signed)
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 intake, some cough and shortness of breath.  CXR revealed increased infiltrate on the right side. Admitted for sepsis due to pneumonia, AKI and metabolic acidosis.  He was treated with cefepime and azithromycin for 7 days.  Renal function and metabolic acidosis improved with hydration. Patient also had pancytopenia likely due to recent chemotherapy and sepsis.  He was started on Granix by oncology.  Also received platelet transfusions.  02/04/2019-patient had worsening mental status, tachypnea, tachycardia and low temperature overnight.  Temperature did not improve with warm blankets.  Subjective: Patient had worsening mental status, tachypnea, tachycardia and low temperature overnight.  Temperature did not improve with warm blankets.  Sleepy but arouses to loud voice.  Barely follows command.  No obvious focal neuro deficit.   Objective: Vitals:   02/04/19 0839 02/04/19 0936 02/04/19 1155 02/04/19 1245  BP: (!) 149/104 (!) 133/101 (!) 150/98 (!) 166/98  Pulse: (!) 112 (!) 122 90   Resp: (!) 22 (!) 21 20   Temp:  (!) 96 F (35.6 C) (!) 96.3 F (35.7 C) (!) 96.3 F (35.7 C)  TempSrc:  Rectal Rectal Rectal  SpO2: 100% 99% 100%   Weight:      Height:        Intake/Output Summary (Last 24 hours) at 02/04/2019 1255 Last data filed at 02/04/2019 0951 Gross per 24 hour  Intake 768 ml  Output 2650 ml  Net -1882 ml   Filed Weights   02/02/19 0542 02/03/19 0500 02/04/19 0500  Weight: 71.3 kg 71.1 kg 70.3 kg    Examination:  GENERAL: No acute distress.  Sleepy but arises to voice.  Chronically ill-appearing. HEENT:  MMM.  Vision and hearing grossly intact.  NECK: Supple.  No apparent JVD.  RESP:  No IWOB.  Fair air movement bilaterally. CVS: Tachycardic. Heart sounds normal.  ABD/GI/GU: Bowel sounds present. Soft.  Mild diffuse tenderness to palpation.  No rebound or guarding. MSK/EXT: No apparent deformity or edema.  SKIN: no apparent skin lesion or wound NEURO: Sleepy.  Briefly opens his eyes to voice.  Does not follows command.  No obvious focal neuro deficit. PSYCH: Sleepy.  Assessment & Plan: Undifferentiated severe sepsis: tachycardic, tachypneic with low temperature with endorgan damage as evidenced by elevated liver enzymes and encephalopathy. Patient is neutropenic.  Unclear source of infection.  Just completed 7 days of IV cefepime and azithromycin for pneumonia.  Chest x-ray with slightly increased right perihilar infiltrate.  Urinalysis is not impressive.  -Blood and urine cultures -Broad-spectrum antibiotic with cefepime and vancomycin -We will obtain CT abdomen-has diffuse tenderness to palpation. -Check TSH and ammonia levels. -Bear hugger for low temp. -Transfer patient to stepdown unit  Acute metabolic encephalopathy: sleepy but arouses to voice and barely follows command.  No focal neuro deficit. -Infectious work-up and management as above. -Check TSH and ammonia levels -We will get CT head if no improvement.  Mild hyponatremia: Suspect dehydration due to poor p.o. intake -Half-normal saline at 75 cc an hour. -We will monitor.  Stage IV non-small cell lung cancer with bone metastasis Pancytopenia-neutropenia -WBC remains low 0.5.  Hgb stable.  Platelets improved after transfusion. -Granix per oncology -Monitor CBC.  Acute kidney injury on CKD-3/hyperkalemia/metabolic acidosis-renal ultrasound negative for hydronephrosis. -AKI and metabolic acidosis improved.   -Hyperkalemia resolved -Recheck renal function in the morning  Elevated liver enzymes: Likely due to sepsis.  CK  within normal range.  Acute hepatitis panel negative. -Monitor sepsis as above -Continue monitoring  IDDM-2 with renal complication: CBG was in fair range.  -Continue current regimen  Essential hypertension: Normotensive. -Continue metoprolol.  Chronic pain -Continue as needed pain medications cautiously.  Pressure ulcer: Stage II sacral wound.  POA. Pressure Injury 01/30/2019 Sacrum Posterior;Mid Stage II - Partial thickness loss of dermis presenting as a shallow open ulcer with a red, pink wound bed without slough. (Active)  01/13/2019 2000  Location: Sacrum  Location Orientation: Posterior;Mid  Staging: Stage II - Partial thickness loss of dermis presenting as a shallow open ulcer with a red, pink wound bed without slough.    DVT prophylaxis: SCD due to thrombocytopenia Code Status: Full code Family Communication: Patient and/or RN. Available if any question. Disposition Plan: Transfer patient to SW Consultants: Oncology  Procedures:  None  Microbiology summarized: [id number] negative. Blood culture with coag negative staph in 1 of the 2 bottles  Antimicrobials: Anti-infectives (From admission, onward)   Start     Dose/Rate Route Frequency Ordered Stop   02/05/19 0900  vancomycin (VANCOCIN) IVPB 1000 mg/200 mL premix     1,000 mg 200 mL/hr over 60 Minutes Intravenous Every 24 hours 02/04/19 0839     02/04/19 0900  ceFEPIme (MAXIPIME) 2 g in sodium chloride 0.9 % 100 mL IVPB     2 g 200 mL/hr over 30 Minutes Intravenous Every 12 hours 02/04/19 0839     02/04/19 0845  vancomycin (VANCOCIN) 1,500 mg in sodium chloride 0.9 % 500 mL IVPB     1,500 mg 250 mL/hr over 120 Minutes Intravenous  Once 02/04/19 0839 02/04/19 1145   01/31/19 1200  ceFEPIme (MAXIPIME) 2 g in sodium chloride 0.9 % 100 mL IVPB  Status:  Discontinued     2 g 200 mL/hr over 30 Minutes Intravenous Every 12 hours 01/31/19 1058 02/03/19 0928   01/30/19 1400  vancomycin (VANCOCIN) IVPB 1000 mg/200 mL premix   Status:  Discontinued     1,000 mg 200 mL/hr over 60 Minutes Intravenous Every 48 hours 01/17/2019 1935 01/29/19 1222   01/29/19 1600  ceFEPIme (MAXIPIME) 2 g in sodium chloride 0.9 % 100 mL IVPB  Status:  Discontinued     2 g 200 mL/hr over 30 Minutes Intravenous Every 24 hours 01/07/2019 1932 01/31/19 1058   01/22/2019 2000  azithromycin (ZITHROMAX) 500 mg in sodium chloride 0.9 % 250 mL IVPB  Status:  Discontinued     500 mg 250 mL/hr over 60 Minutes Intravenous Every 24 hours 01/26/2019 1928 02/03/19 0928   01/07/2019 1500  ceFEPIme (MAXIPIME) 2 g in sodium chloride 0.9 % 100 mL IVPB     2 g 200 mL/hr over 30 Minutes Intravenous STAT 01/30/2019 1446 01/27/2019 1910   01/04/2019 1500  vancomycin (VANCOCIN) 1,500 mg in sodium chloride 0.9 % 500 mL IVPB     1,500 mg 250 mL/hr over 120 Minutes Intravenous STAT 01/18/2019 1446 01/27/2019 1719      Sch Meds:  Scheduled Meds: . Chlorhexidine Gluconate Cloth  6 each Topical Daily  . docusate sodium  100 mg Oral BID  . feeding supplement (ENSURE ENLIVE)  237 mL Oral BID BM  . insulin aspart  0-5 Units Subcutaneous QHS  . insulin aspart  0-9  Units Subcutaneous TID WC  . insulin aspart  3 Units Subcutaneous TID WC  . levothyroxine  100 mcg Oral Daily  . metoprolol tartrate  50 mg Oral BID  . mirtazapine  7.5 mg Oral QHS  . multivitamin with minerals  1 tablet Oral Daily  . pantoprazole  40 mg Oral BID  . sodium chloride flush  10-40 mL Intracatheter Q12H  . Tbo-filgastrim (GRANIX) SQ  480 mcg Subcutaneous q1800   Continuous Infusions: . sodium chloride 75 mL/hr at 02/04/19 0942  . ceFEPime (MAXIPIME) IV 2 g (02/04/19 1211)  . [START ON 02/05/2019] vancomycin     PRN Meds:.acetaminophen **OR** acetaminophen, metoprolol tartrate, nitroGLYCERIN, ondansetron **OR** ondansetron (ZOFRAN) IV, polyethylene glycol, sodium chloride flush   I have personally reviewed the following labs and images: CBC: Recent Labs  Lab 01/04/2019 1305  01/30/19 0500 01/31/19  0645 02/01/19 0404 02/02/19 0350 02/03/19 0425 02/04/19 0458  WBC 0.3*   < > 0.2* 0.2* 0.2* 0.2* 0.4* 0.5*  NEUTROABS 0.2*  --  0.0*  --   --  0.1*  --  0.3*  HGB 7.4*   < > 7.6* 7.2* 7.4* 7.1* 8.6* 8.6*  HCT 22.9*   < > 23.1* 21.9* 22.1* 21.7* 25.5* 25.9*  MCV 102.7*   < > 96.7 97.3 94.8 95.2 93.1 93.5  PLT 12*   < > 30* 16* 11* 20* 12* 28*   < > = values in this interval not displayed.   BMP &GFR Recent Labs  Lab 01/30/19 0500 01/31/19 0645 02/01/19 0404 02/03/19 0425 02/04/19 0458  NA 141 137 139 143 147*  K 5.6* 4.0 3.8 3.2* 3.5  CL 113* 109 110 114* 119*  CO2 16* 15* 15* 17* 18*  GLUCOSE 149* 143* 113* 129* 172*  BUN 39* 28* 25* 30* 33*  CREATININE 2.28* 1.72* 1.66* 1.41* 1.49*  CALCIUM 6.3* 8.5* 8.5* 8.6* 8.7*  MG 0.9* 2.2  --  1.6* 2.0  PHOS  --   --   --  3.4  --    Estimated Creatinine Clearance: 47.2 mL/min (A) (by C-G formula based on SCr of 1.49 mg/dL (H)). Liver & Pancreas: Recent Labs  Lab 01/30/19 0500 01/31/19 0645 02/01/19 0404 02/03/19 0425 02/04/19 0458  AST 25 40 94* 145* 366*  ALT 15 22 46* 89* 207*  ALKPHOS 60 64 65 81 101  BILITOT 1.4* 1.4* 2.0* 2.1* 2.8*  PROT 5.6* 5.6* 5.7* 5.8* 5.9*  ALBUMIN 2.1* 2.1* 2.0* 2.0* 1.9*   No results for input(s): LIPASE, AMYLASE in the last 168 hours. No results for input(s): AMMONIA in the last 168 hours. Diabetic: No results for input(s): HGBA1C in the last 72 hours. Recent Labs  Lab 02/03/19 1121 02/03/19 1705 02/03/19 2150 02/04/19 0742 02/04/19 1150  GLUCAP 146* 140* 148* 149* 155*   Cardiac Enzymes: Recent Labs  Lab 02/03/19 1033  CKTOTAL 88   No results for input(s): PROBNP in the last 8760 hours. Coagulation Profile: No results for input(s): INR, PROTIME in the last 168 hours. Thyroid Function Tests: No results for input(s): TSH, T4TOTAL, FREET4, T3FREE, THYROIDAB in the last 72 hours. Lipid Profile: No results for input(s): CHOL, HDL, LDLCALC, TRIG, CHOLHDL, LDLDIRECT in the last  72 hours. Anemia Panel: No results for input(s): VITAMINB12, FOLATE, FERRITIN, TIBC, IRON, RETICCTPCT in the last 72 hours. Urine analysis:    Component Value Date/Time   COLORURINE YELLOW 02/04/2019 [place] 02/04/2019 0952   LABSPEC 1.006 02/04/2019 [place]  6.0 02/04/2019 0952   GLUCOSEU NEGATIVE 02/04/2019 0952   HGBUR MODERATE (A) 02/04/2019 0952   BILIRUBINUR NEGATIVE 02/04/2019 0952   KETONESUR 5 (A) 02/04/2019 0952   PROTEINUR NEGATIVE 02/04/2019 0952   UROBILINOGEN 1.0 12/16/2008 0451   NITRITE NEGATIVE 02/04/2019 0952   LEUKOCYTESUR NEGATIVE 02/04/2019 0952   Sepsis Labs: Invalid input(s): PROCALCITONIN, [place]  Microbiology: Recent Results (from the past 240 hour(s))  SARS Coronavirus 2 [facility] order, Performed in [facility] hospital lab) Nasopharyngeal Nasopharyngeal Swab     Status: None   Collection Time: 01/07/2019  1:06 PM   Specimen: Nasopharyngeal Swab  Result Value Ref Range Status   SARS Coronavirus 2 NEGATIVE NEGATIVE Final    Comment: (NOTE) If result is NEGATIVE SARS-CoV-2 target nucleic acids are NOT DETECTED. The SARS-CoV-2 RNA is generally detectable in upper and lower  respiratory specimens during the acute phase of infection. The lowest  concentration of SARS-CoV-2 viral copies this assay can detect is 250  copies / mL. A negative result does not preclude SARS-CoV-2 infection  and should not be used as the sole basis for treatment or other  patient management decisions.  A negative result may occur with  improper specimen collection / handling, submission of specimen other  than nasopharyngeal swab, presence of viral mutation(s) within the  areas targeted by this assay, and inadequate number of viral copies  (<250 copies / mL). A negative result must be combined with clinical  observations, patient history, and epidemiological information. If result is POSITIVE SARS-CoV-2 target nucleic acids are DETECTED. The  SARS-CoV-2 RNA is generally detectable in upper and lower  respiratory specimens dur ing the acute phase of infection.  Positive  results are indicative of active infection with SARS-CoV-2.  Clinical  correlation with patient history and other diagnostic information is  necessary to determine patient infection status.  Positive results do  not rule out bacterial infection or co-infection with other viruses. If result is PRESUMPTIVE POSTIVE SARS-CoV-2 nucleic acids MAY BE PRESENT.   A presumptive positive result was obtained on the submitted specimen  and confirmed on repeat testing.  While 2019 novel coronavirus  (SARS-CoV-2) nucleic acids may be present in the submitted sample  additional confirmatory testing may be necessary for epidemiological  and / or clinical management purposes  to differentiate between  SARS-CoV-2 and other Sarbecovirus currently known to infect humans.  If clinically indicated additional testing with an alternate test  methodology [phone]) is advised. The SARS-CoV-2 RNA is generally  detectable in upper and lower respiratory sp ecimens during the acute  phase of infection. The expected result is Negative. Fact Sheet for Patients:  StrictlyIdeas.no Fact Sheet for Healthcare Providers: BankingDealers.co.za This test is not yet approved or cleared by the [place] FDA and has been authorized for detection and/or diagnosis of SARS-CoV-2 by FDA under an Emergency Use Authorization (EUA).  This EUA will remain in effect (meaning this test can be used) for the duration of the COVID-19 declaration under Section 564(b)(1) of the Act, 21 U.S.C. section 360bbb-3(b)(1), unless the authorization is terminated or revoked sooner. Performed at [facility], [place] [address], [place], [place] [address]   Blood culture (routine x 2)     Status: Abnormal   Collection Time: 01/17/2019  1:37 PM   Specimen:  BLOOD  Result Value Ref Range Status   Specimen Description   Final    BLOOD PORTA CATH Performed at [facility] [address] [place], [place] [address]  Special Requests   Final    BOTTLES DRAWN AEROBIC AND ANAEROBIC Blood Culture adequate volume Performed at [facility] [address] [place], [place] [address]    Culture  Setup Time   Final    GRAM POSITIVE COCCI AEROBIC BOTTLE ONLY CRITICAL RESULT CALLED TO, READ BACK BY AND VERIFIED WITH: PHARMD M SWAAYNE [id number] AT 1217 BY CM GRAM POSITIVE RODS ANAEROBIC BOTTLE ONLY CRITICAL RESULT CALLED TO, READ BACK BY AND VERIFIED WITH: M. Erskin Burnet [phone] 01/30/2019 T. TYSOR    Culture (A)  Final    STAPHYLOCOCCUS SPECIES (COAGULASE NEGATIVE) THE SIGNIFICANCE OF ISOLATING THIS ORGANISM FROM A SINGLE VENIPUNCTURE CANNOT BE PREDICTED WITHOUT FURTHER CLINICAL AND CULTURE CORRELATION. SUSCEPTIBILITIES AVAILABLE ONLY ON REQUEST. DIPHTHEROIDS(CORYNEBACTERIUM SPECIES) Standardized susceptibility testing for this organism is not available. Performed at [facility], [place] [address], [place], [place] [address]    Report Status 02/03/2019 FINAL  Final  Blood Culture ID Panel (Reflexed)     Status: Abnormal   Collection Time: 01/29/2019  1:37 PM  Result Value Ref Range Status   Enterococcus species NOT DETECTED NOT DETECTED Final   Listeria monocytogenes NOT DETECTED NOT DETECTED Final   Staphylococcus species DETECTED (A) NOT DETECTED Final    Comment: Methicillin (oxacillin) susceptible coagulase negative staphylococcus. Possible blood culture contaminant (unless isolated from more than one blood culture draw or clinical case suggests pathogenicity). No antibiotic treatment is indicated for blood  culture contaminants. CRITICAL RESULT CALLED TO, READ BACK BY AND VERIFIED WITH: PHARMD Shelda Jakes [id number] AT 1217 BY CM    Staphylococcus aureus (BCID) NOT DETECTED NOT DETECTED Final   Methicillin  resistance NOT DETECTED NOT DETECTED Final   Streptococcus species NOT DETECTED NOT DETECTED Final   Streptococcus agalactiae NOT DETECTED NOT DETECTED Final   Streptococcus pneumoniae NOT DETECTED NOT DETECTED Final   Streptococcus pyogenes NOT DETECTED NOT DETECTED Final   Acinetobacter baumannii NOT DETECTED NOT DETECTED Final   Enterobacteriaceae species NOT DETECTED NOT DETECTED Final   Enterobacter cloacae complex NOT DETECTED NOT DETECTED Final   Escherichia coli NOT DETECTED NOT DETECTED Final   Klebsiella oxytoca NOT DETECTED NOT DETECTED Final   Klebsiella pneumoniae NOT DETECTED NOT DETECTED Final   Proteus species NOT DETECTED NOT DETECTED Final   Serratia marcescens NOT DETECTED NOT DETECTED Final   Haemophilus influenzae NOT DETECTED NOT DETECTED Final   Neisseria meningitidis NOT DETECTED NOT DETECTED Final   Pseudomonas aeruginosa NOT DETECTED NOT DETECTED Final   Candida albicans NOT DETECTED NOT DETECTED Final   Candida glabrata NOT DETECTED NOT DETECTED Final   Candida krusei NOT DETECTED NOT DETECTED Final   Candida parapsilosis NOT DETECTED NOT DETECTED Final   Candida tropicalis NOT DETECTED NOT DETECTED Final    Comment: Performed at [facility], [address], [place], [place] [address]  Urine culture     Status: None   Collection Time: 01/16/2019  6:00 PM   Specimen: Urine, Clean Catch  Result Value Ref Range Status   Specimen Description   Final    URINE, CLEAN CATCH Performed at [facility], [place] [address], [place], [place] [address]    Special Requests   Final    NONE Performed at [facility], [place] [address] [place], [place] [address]    Culture   Final    NO GROWTH Performed at [facility], [place] [address], [place], [place] [address]    Report Status 01/30/2019 FINAL  Final  MRSA PCR Screening     Status: None   Collection Time: 01/09/2019  7:39 PM   Specimen: Nasopharyngeal  Result Value Ref  Range Status   MRSA by PCR NEGATIVE NEGATIVE Final    Comment:        The GeneXpert MRSA Assay (FDA approved for NASAL specimens only), is one component of a comprehensive MRSA colonization surveillance program. It is not intended to diagnose MRSA infection nor to guide or monitor treatment for MRSA infections. Performed at [facility], [place] [address], [place], [place] [address]     Radiology Studies: Dg Chest Port 1 View  Result Date: 02/04/2019 CLINICAL DATA:  Dyspnea, diabetes mellitus, hypertension, lung cancer EXAM: PORTABLE CHEST 1 VIEW COMPARISON:  Portable exam 6837 hours compared to 01/30/2019 Correlation: CT chest abdomen pelvis 12/11/2018 FINDINGS: RIGHT jugular Port-A-Cath with tip projecting over SVC. Kyphotic positioning. Normal heart size and pulmonary vascularity. Abnormal soft tissue density again identified at the RIGHT hilum, corresponding to vascular structures and perihilar infiltrates on prior CT. Mild RIGHT perihilar infiltrate slightly increased, question pneumonia versus sequela of radiation therapy if patient has such history. Remaining lungs clear. Overall volume loss in the RIGHT hemithorax versus LEFT. No pleural effusion or pneumothorax. No acute osseous findings. IMPRESSION: Slightly increased RIGHT perihilar infiltrate, question pneumonia versus radiation therapy change, recommend correlation with history. Electronically Signed   By: Lavonia Dana M.D.   On: 02/04/2019 08:24     [facility] [place] [facility]  If 7PM-7AM, please contact night-coverage [email or web address] Password [email or web address] 02/04/2019, 12:55 PM

## 2019-02-04 NOTE — Progress Notes (Signed)
   02/03/19 2200  What Happened  Was fall witnessed? Yes  Who witnessed fall? Ajah Vanhoose RN  Patients activity before fall bathroom-assisted  Point of contact buttocks;hip/leg  Was patient injured? Unsure  Follow Up  MD notified Beaulah Corin  Time MD notified 2200  Family notified Yes - comment (Left voicemail for SO to call back)  Time family notified 2205  Additional tests No  Simple treatment Other (comment) (No bruising noted)  Progress note created (see row info) Yes  Adult Fall Risk Assessment  Risk Factor Category (scoring not indicated) Fall has occurred during this admission (document High fall risk)  Age 35  Fall History: Fall within 6 months prior to admission 0  Elimination; Bowel and/or Urine Incontinence 0  Elimination; Bowel and/or Urine Urgency/Frequency 2  Medications: includes PCA/Opiates, Anti-convulsants, Anti-hypertensives, Diuretics, Hypnotics, Laxatives, Sedatives, and Psychotropics 5  Patient Care Equipment 1  Mobility-Assistance 2  Mobility-Gait 2  Mobility-Sensory Deficit 0  Altered awareness of immediate physical environment 1  Impulsiveness 2  Lack of understanding of one's physical/cognitive limitations 0  Total Score 16  Patient Fall Risk Level High fall risk  Adult Fall Risk Interventions  Required Bundle Interventions *See Row Information* High fall risk - low, moderate, and high requirements implemented  Additional Interventions Room near nurses station;Use of appropriate toileting equipment (bedpan, BSC, etc.)  Screening for Fall Injury Risk (To be completed on HIGH fall risk patients) - Assessing Need for Low Bed  Risk For Fall Injury- Low Bed Criteria Previous fall this admission  Will Implement Low Bed and Floor Mats No - Criteria no longer met for low bed  Screening for Fall Injury Risk (To be completed on HIGH fall risk patients who do not meet crieteria for Low Bed) - Assessing Need for Floor Mats Only  Risk For Fall Injury- Criteria for Floor  Mats Bleeding risk-anticoagulation (not prophylaxis)  Will Implement Floor Mats Yes  Vitals  Temp 97.6 F (36.4 C)  Temp Source Oral  BP (!) 138/99  MAP (mmHg) 110  BP Location Right Leg  BP Method Automatic  Pulse Rate (!) 123  Resp 20  Oxygen Therapy  SpO2 96 %  O2 Device Room Air  Pain Assessment  Pain Scale 0-10  Pain Score 0  PAINAD (Pain Assessment in Advanced Dementia)  Negative Vocalization 1  PCA/Epidural/Spinal Assessment  Respiratory Pattern Regular;Unlabored  Neurological  Neuro (WDL) X  Level of Consciousness Alert  Orientation Level Oriented to person;Oriented to time;Disoriented to place;Disoriented to situation  Cognition Memory impairment  Speech  (Some slurring w/out dentures in)  Musculoskeletal  Musculoskeletal (WDL) X  Generalized Weakness Yes  Integumentary  Integumentary (WDL) X  Skin Color Appropriate for ethnicity  Skin Condition Dry;Flaky  Skin Integrity Ecchymosis  Ecchymosis Location Arm;Leg  Ecchymosis Location Orientation Bilateral  Skin Turgor Non-tenting  Pain Assessment  Date Pain First Started  (Pt states no pain)   Family updated about fall and plan of care. RN assured that family would be notified of any acute change. Pt resting in bed w/ no complaints of pain.

## 2019-02-04 NOTE — Progress Notes (Signed)
   02/04/19 0735  MEWS Score  Resp (!) 24  Pulse Rate (!) 119  BP (!) 141/96  Temp (!) 95.1 F (35.1 C)  SpO2 98 %  O2 Device Room Air  MEWS Score  MEWS RR 1  MEWS Pulse 2  MEWS Systolic 0  MEWS LOC 0  MEWS Temp 1  MEWS Score 4  MEWS Score Color Red  MEWS Assessment  Is this an acute change? Yes  MEWS guidelines implemented *See Row Information* Red  Rapid Response Notification  Name of Rapid Response RN Notified Wynetta Emery RN   Date Rapid Response Notified 02/04/19  Time Rapid Response Notified 0740  Provider Notification  Provider Name/Title Cyndia Skeeters MD  Date Provider Notified 02/04/19  Time Provider Notified 0740  Notification Type Page  Notification Reason Change in status  Response See new orders  Date of Provider Response 02/04/19  Time of Provider Response [phone]

## 2019-02-04 NOTE — Progress Notes (Signed)
Patient's wife called back and updated on patient's status.  Interested in palliative care consult.  Consult placed.

## 2019-02-04 NOTE — Progress Notes (Signed)
   02/04/19 0609  Vitals  Temp (!) 96.4 F (35.8 C)  BP (!) 154/92  MAP (mmHg) 111  BP Location Right Arm  BP Method Automatic  Patient Position (if appropriate) Lying  Pulse Rate (!) 105  Resp (!) 21  Oxygen Therapy  SpO2 100 %  O2 Device Room Air  MEWS Score  MEWS RR 1  MEWS Pulse 1  MEWS Systolic 0  MEWS LOC 0  MEWS Temp 1  MEWS Score 3  MEWS Score Color Yellow   Provider notified of rectal temp 95.5. Verbal orders received to dry pt and keep warm w/ blankets then to recheck and update provider w/ results.

## 2019-02-04 NOTE — Progress Notes (Signed)
PT Cancellation Note  Patient Details Name: Zachary Knox MRN: [id number] DOB: 10-Apr-1951   Cancelled Treatment:    Reason Eval/Treat Not Completed: Medical issues which prohibited therapy. Defer per RN, pt had fall this morning, red mews/low temp--possible transfer to step down.   [facility] 02/04/2019, 10:31 AM

## 2019-02-04 NOTE — Progress Notes (Signed)
Attempted to call patient's wife for update but no answer twice.  Did not leave voicemail.

## 2019-02-05 ENCOUNTER — Inpatient Hospital Stay (HOSPITAL_COMMUNITY): Payer: Medicare Other

## 2019-02-05 DIAGNOSIS — Z515 Encounter for palliative care: Secondary | ICD-10-CM

## 2019-02-05 DIAGNOSIS — Z7189 Other specified counseling: Secondary | ICD-10-CM

## 2019-02-05 DIAGNOSIS — B49 Unspecified mycosis: Secondary | ICD-10-CM | POA: Diagnosis not present

## 2019-02-05 LAB — BLOOD CULTURE ID PANEL (REFLEXED)
Acinetobacter baumannii: NOT DETECTED
Candida albicans: DETECTED — AB
Candida glabrata: NOT DETECTED
Candida krusei: NOT DETECTED
Candida parapsilosis: NOT DETECTED
Candida tropicalis: NOT DETECTED
Enterobacter cloacae complex: NOT DETECTED
Enterobacteriaceae species: NOT DETECTED
Enterococcus species: NOT DETECTED
Escherichia coli: NOT DETECTED
Haemophilus influenzae: NOT DETECTED
Klebsiella oxytoca: NOT DETECTED
Klebsiella pneumoniae: NOT DETECTED
Listeria monocytogenes: NOT DETECTED
Neisseria meningitidis: NOT DETECTED
Proteus species: NOT DETECTED
Pseudomonas aeruginosa: NOT DETECTED
Serratia marcescens: NOT DETECTED
Staphylococcus aureus (BCID): NOT DETECTED
Staphylococcus species: NOT DETECTED
Streptococcus agalactiae: NOT DETECTED
Streptococcus pneumoniae: NOT DETECTED
Streptococcus pyogenes: NOT DETECTED
Streptococcus species: NOT DETECTED

## 2019-02-05 LAB — COMPREHENSIVE METABOLIC PANEL
ALT: 229 U/L — ABNORMAL HIGH (ref 0–44)
AST: 263 U/L — ABNORMAL HIGH (ref 15–41)
Albumin: 2 g/dL — ABNORMAL LOW (ref 3.5–5.0)
Alkaline Phosphatase: 152 U/L — ABNORMAL HIGH (ref 38–126)
Anion gap: 11 (ref 5–15)
BUN: 41 mg/dL — ABNORMAL HIGH (ref 8–23)
CO2: 17 mmol/L — ABNORMAL LOW (ref 22–32)
Calcium: 8.4 mg/dL — ABNORMAL LOW (ref 8.9–10.3)
Chloride: 126 mmol/L — ABNORMAL HIGH (ref 98–111)
Creatinine, Ser: 1.52 mg/dL — ABNORMAL HIGH (ref 0.61–1.24)
GFR calc Af Amer: 54 mL/min — ABNORMAL LOW (ref >60)
GFR calc non Af Amer: 46 mL/min — ABNORMAL LOW (ref >60)
Glucose, Bld: 181 mg/dL — ABNORMAL HIGH (ref 70–99)
Potassium: 3.3 mmol/L — ABNORMAL LOW (ref 3.5–5.1)
Sodium: 154 mmol/L — ABNORMAL HIGH (ref 135–145)
Total Bilirubin: 2.4 mg/dL — ABNORMAL HIGH (ref 0.3–1.2)
Total Protein: 6 g/dL — ABNORMAL LOW (ref 6.5–8.1)

## 2019-02-05 LAB — MAGNESIUM: Magnesium: 2.1 mg/dL (ref 1.7–2.4)

## 2019-02-05 LAB — CBC
HCT: 27 % — ABNORMAL LOW (ref 39.0–52.0)
Hemoglobin: 8.9 g/dL — ABNORMAL LOW (ref 13.0–17.0)
MCH: 31.1 pg (ref 26.0–34.0)
MCHC: 33 g/dL (ref 30.0–36.0)
MCV: 94.4 fL (ref 80.0–100.0)
Platelets: 16 10*3/uL — CL (ref 150–400)
RBC: 2.86 MIL/uL — ABNORMAL LOW (ref 4.22–5.81)
RDW: 17.5 % — ABNORMAL HIGH (ref 11.5–15.5)
WBC: 0.7 10*3/uL — CL (ref 4.0–10.5)
nRBC: 0 % (ref 0.0–0.2)

## 2019-02-05 LAB — URINE CULTURE: Culture: NO GROWTH

## 2019-02-05 LAB — CORTISOL-AM, BLOOD: Cortisol - AM: 66.8 ug/dL — ABNORMAL HIGH (ref 6.7–22.6)

## 2019-02-05 LAB — BILIRUBIN, FRACTIONATED(TOT/DIR/INDIR)
Bilirubin, Direct: 1.3 mg/dL — ABNORMAL HIGH (ref 0.0–0.2)
Indirect Bilirubin: 1.2 mg/dL — ABNORMAL HIGH (ref 0.3–0.9)
Total Bilirubin: 2.5 mg/dL — ABNORMAL HIGH (ref 0.3–1.2)

## 2019-02-05 LAB — GLUCOSE, CAPILLARY: Glucose-Capillary: 172 mg/dL — ABNORMAL HIGH (ref 70–99)

## 2019-02-05 LAB — PHOSPHORUS: Phosphorus: 3.2 mg/dL (ref 2.5–4.6)

## 2019-02-05 LAB — AMMONIA: Ammonia: 30 umol/L (ref 9–35)

## 2019-02-05 MED ORDER — DEXTROSE-NACL 5-0.2 % IV SOLN: INTRAVENOUS | Status: DC

## 2019-02-05 MED ORDER — CARVEDILOL 12.5 MG PO TABS: 12.5000 mg | ORAL_TABLET | Freq: Two times a day (BID) | ORAL | Status: DC

## 2019-02-05 MED ORDER — ACETAMINOPHEN 650 MG RE SUPP: 650.0000 mg | Freq: Four times a day (QID) | RECTAL | Status: DC | PRN

## 2019-02-05 MED ORDER — MORPHINE SULFATE (CONCENTRATE) 10 MG/0.5ML PO SOLN: 5.0000 mg | ORAL | Status: DC | PRN

## 2019-02-05 MED ORDER — HALOPERIDOL 0.5 MG PO TABS: 0.5000 mg | ORAL_TABLET | ORAL | Status: DC | PRN

## 2019-02-05 MED ORDER — GLYCOPYRROLATE 0.2 MG/ML IJ SOLN: 0.2000 mg | INTRAMUSCULAR | Status: DC | PRN

## 2019-02-05 MED ORDER — LORAZEPAM 2 MG/ML IJ SOLN: 1.0000 mg | INTRAMUSCULAR | Status: DC | PRN

## 2019-02-05 MED ORDER — LORAZEPAM 2 MG/ML PO CONC: 1.0000 mg | ORAL | Status: DC | PRN

## 2019-02-05 MED ORDER — LORAZEPAM 1 MG PO TABS: 1.0000 mg | ORAL_TABLET | ORAL | Status: DC | PRN

## 2019-02-05 MED ORDER — SODIUM CHLORIDE 0.9 % IV SOLN
200.0000 mg | Freq: Once | INTRAVENOUS | Status: AC
  Administered 2019-02-05: 22:00:00 200 mg via INTRAVENOUS
  Filled 2019-02-05: qty 200

## 2019-02-05 MED ORDER — BIOTENE DRY MOUTH MT LIQD: 15.0000 mL | OROMUCOSAL | Status: DC | PRN

## 2019-02-05 MED ORDER — SODIUM CHLORIDE 0.9 % IV SOLN
100.0000 mg | INTRAVENOUS | Status: DC
  Filled 2019-02-05: qty 100

## 2019-02-05 MED ORDER — HALOPERIDOL LACTATE 5 MG/ML IJ SOLN: 0.5000 mg | INTRAMUSCULAR | Status: DC | PRN

## 2019-02-05 MED ORDER — ACETAMINOPHEN 325 MG PO TABS: 650.0000 mg | ORAL_TABLET | Freq: Four times a day (QID) | ORAL | Status: DC | PRN

## 2019-02-05 MED ORDER — ONDANSETRON HCL 4 MG/2ML IJ SOLN: 4.0000 mg | Freq: Four times a day (QID) | INTRAMUSCULAR | Status: DC | PRN

## 2019-02-05 MED ORDER — GLYCOPYRROLATE 1 MG PO TABS: 1.0000 mg | ORAL_TABLET | ORAL | Status: DC | PRN

## 2019-02-05 MED ORDER — KCL IN DEXTROSE-NACL 20-5-0.2 MEQ/L-%-% IV SOLN
INTRAVENOUS | Status: DC
  Administered 2019-02-05: 08:00:00 via INTRAVENOUS
  Filled 2019-02-05: qty 1000

## 2019-02-05 MED ORDER — HALOPERIDOL LACTATE 2 MG/ML PO CONC
0.5000 mg | ORAL | Status: DC | PRN
  Filled 2019-02-05: qty 0.3

## 2019-02-05 MED ORDER — MORPHINE SULFATE (CONCENTRATE) 10 MG/0.5ML PO SOLN
5.0000 mg | ORAL | Status: DC | PRN
  Administered 2019-02-05 – 2019-02-06 (×2): 5 mg via SUBLINGUAL
  Filled 2019-02-05 (×2): qty 0.5

## 2019-02-05 MED ORDER — ONDANSETRON 4 MG PO TBDP: 4.0000 mg | ORAL_TABLET | Freq: Four times a day (QID) | ORAL | Status: DC | PRN

## 2019-02-05 NOTE — Progress Notes (Signed)
Pt arrived on floor with family at bedside. No distress noted. No s/s of pain or discomfort. Will continue to monitor.

## 2019-02-05 NOTE — Progress Notes (Signed)
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 intake, some cough and shortness of breath.  CXR revealed increased infiltrate on the right side. Admitted for sepsis due to pneumonia, AKI and metabolic acidosis.  He was treated with cefepime and azithromycin for 7 days.  Renal function and metabolic acidosis improved with hydration. Patient also had pancytopenia likely due to recent chemotherapy and sepsis.  He was started on Granix by oncology.  Also received platelet transfusions.  02/04/2019-patient had worsening mental status, tachypnea, tachycardia and low temperature overnight.  Temperature did not improve with warm blankets. Chest x-ray with slightly increased right perihilar infiltrate.  Cultures obtained.  Started on broad-spectrum antibiotics.  Transfer to stepdown.  Temperature improved with bear hugger but remained tachypneic and tachycardic.   02/05/2019-intermittently wakes up but remains somnolent for most part.  Looks lethargic.  Hypernatremia worsened despite half-normal saline infusion.  Still with elevated liver enzymes and total bili.  Still with pancytopenia.  Discussed with patient's wife at bedside about his deteriorating condition.  Wife would like him to be as comfortable as possible for the time he got left, which I think is reasonable given his advanced lung cancer, pancytopenia fairly clear and quality of life.  Wife is interested in residential hospice.  CSW consulted.  Comfort measures initiated.  Subjective: Remains somnolent for most part.  Briefly opens his eyes and responds to voice.  Does not follow command to hold conversations.  Looks lethargic  but in no acute distress.  Objective: Vitals:   02/05/19 0200 02/05/19 0426 02/05/19 0700 02/05/19 0800  BP: (!) 149/107     Pulse:      Resp: (!) 26  (!) 25   Temp:  (!) 97.3 F (36.3 C)  98.8 F (37.1 C)  TempSrc:  Axillary    SpO2: 100%  100%   Weight:  68.2 kg    Height:        Intake/Output Summary (Last 24 hours) at 02/05/2019 1137 Last data filed at 02/05/2019 0600 Gross per 24 hour  Intake 1120 ml  Output -  Net 1120 ml   Filed Weights   02/03/19 0500 02/04/19 0500 02/05/19 0426  Weight: 71.1 kg 70.3 kg 68.2 kg    Examination:  GENERAL: No acute distress.  Sleepy but arises to voice.  Chronically ill-appearing. HEENT: MMM.  Vision and hearing grossly intact.  NECK: Supple.  No apparent JVD.  RESP:  No IWOB.  Fair air movement bilaterally. CVS: Tachycardic. Heart sounds normal.  ABD/GI/GU: Bowel sounds present. Soft.  Mild diffuse tenderness to palpation.  No rebound or guarding. MSK/EXT: No apparent deformity or edema.  SKIN: no apparent skin lesion or wound NEURO: Somewhat somnolent.  Briefly opens his eyes.  No obvious focal neuro deficit.  Assessment & Plan: Undifferentiated severe sepsis: tachycardic, tachypneic with low temperature with endorgan damage as evidenced by elevated liver enzymes and encephalopathy. Patient is neutropenic.  Unclear source of infection.  Just completed 7 days of IV cefepime and azithromycin for pneumonia.  Chest x-ray with slightly increased right perihilar infiltrate.  Urinalysis is not impressive.  Urine culture negative.  Blood culture pending.    Acute metabolic encephalopathy: sleepy but arouses to voice and barely follows  command.  No focal neuro deficit.   Hypernatremia: Sodium elevated to 154 despite half-normal saline infusion.  Stage IV non-small cell lung cancer with bone metastasis Pancytopenia-neutropenia  Acute kidney injury on CKD-3/hyperkalemia/metabolic acidosis-renal ultrasound negative for hydronephrosis.   Elevated liver enzymes/alkaline phosphatase/hyperbilirubinemia: Likely due to sepsis.  CK within normal range.  Acute hepatitis panel negative.  RUQ ultrasound negative.  IDDM-2 with renal complication:   Essential hypertension: Normotensive.  Chronic pain  Pressure ulcer: Stage II sacral wound.  POA. Pressure Injury 02/02/2019 Sacrum Posterior;Mid Stage II - Partial thickness loss of dermis presenting as a shallow open ulcer with a red, pink wound bed without slough. (Active)  01/09/2019 2000  Location: Sacrum  Location Orientation: Posterior;Mid  Staging: Stage II - Partial thickness loss of dermis presenting as a shallow open ulcer with a red, pink wound bed without slough.    DVT prophylaxis: None Code Status: DNR/DNI. Family Communication: Updated patient's wife at bedside. Disposition Plan: To residential hospice Consultants: Oncology, palliative care  Procedures:  None  Microbiology summarized: JSEGB-15 negative. Blood culture with coag negative staph in 1 of the 2 bottles Urine culture negative.   Antimicrobials: Anti-infectives (From admission, onward)   Start     Dose/Rate Route Frequency Ordered Stop   02/05/19 0900  vancomycin (VANCOCIN) IVPB 1000 mg/200 mL premix  Status:  Discontinued     1,000 mg 200 mL/hr over 60 Minutes Intravenous Every 24 hours 02/04/19 0839 02/05/19 1133   02/04/19 0900  ceFEPIme (MAXIPIME) 2 g in sodium chloride 0.9 % 100 mL IVPB  Status:  Discontinued     2 g 200 mL/hr over 30 Minutes Intravenous Every 12 hours 02/04/19 0839 02/05/19 1133   02/04/19 0845  vancomycin (VANCOCIN) 1,500 mg in sodium chloride 0.9 % 500 mL IVPB     1,500 mg 250 mL/hr over 120 Minutes Intravenous  Once 02/04/19 0839 02/04/19 1145   01/31/19 1200  ceFEPIme (MAXIPIME) 2 g in sodium chloride 0.9 % 100 mL IVPB  Status:  Discontinued     2 g 200 mL/hr over 30 Minutes Intravenous Every 12 hours 01/31/19 1058 02/03/19 0928   01/30/19 1400  vancomycin (VANCOCIN)  IVPB 1000 mg/200 mL premix  Status:  Discontinued     1,000 mg 200 mL/hr over 60 Minutes Intravenous Every 48 hours 01/30/2019 1935 01/29/19 1222   01/29/19 1600  ceFEPIme (MAXIPIME) 2 g in sodium chloride 0.9 % 100 mL IVPB  Status:  Discontinued     2 g 200 mL/hr over 30 Minutes Intravenous Every 24 hours 01/05/2019 1932 01/31/19 1058   01/07/2019 2000  azithromycin (ZITHROMAX) 500 mg in sodium chloride 0.9 % 250 mL IVPB  Status:  Discontinued     500 mg 250 mL/hr over 60 Minutes Intravenous Every 24 hours 01/04/2019 1928 02/03/19 0928   01/19/2019 1500  ceFEPIme (MAXIPIME) 2 g in sodium chloride 0.9 % 100 mL IVPB     2 g 200 mL/hr over 30 Minutes Intravenous STAT 01/25/2019 1446 01/18/2019 1910   01/27/2019 1500  vancomycin (VANCOCIN) 1,500 mg in sodium chloride 0.9 % 500 mL IVPB     1,500 mg 250 mL/hr over 120 Minutes Intravenous STAT 01/22/2019 1446 01/27/2019 1719      Sch Meds:  Scheduled Meds: . Chlorhexidine Gluconate Cloth  6 each Topical Daily  . feeding supplement (ENSURE ENLIVE)  237 mL Oral BID BM   Continuous Infusions:  PRN Meds:.acetaminophen **OR** acetaminophen, antiseptic oral rinse, glycopyrrolate **OR** glycopyrrolate **OR** glycopyrrolate, haloperidol **OR** haloperidol **  OR** haloperidol lactate, LORazepam **OR** LORazepam **OR** LORazepam, morphine CONCENTRATE **OR** morphine CONCENTRATE, ondansetron **OR** ondansetron (ZOFRAN) IV   I have personally reviewed the following labs and images: CBC: Recent Labs  Lab 01/30/19 0500  02/01/19 0404 02/02/19 0350 02/03/19 0425 02/04/19 0458 02/05/19 0430  WBC 0.2*   < > 0.2* 0.2* 0.4* 0.5* 0.7*  NEUTROABS 0.0*  --   --  0.1*  --  0.3*  --   HGB 7.6*   < > 7.4* 7.1* 8.6* 8.6* 8.9*  HCT 23.1*   < > 22.1* 21.7* 25.5* 25.9* 27.0*  MCV 96.7   < > 94.8 95.2 93.1 93.5 94.4  PLT 30*   < > 11* 20* 12* 28* 16*   < > = values in this interval not displayed.   BMP &GFR Recent Labs  Lab 01/30/19 0500 01/31/19 0645 02/01/19 0404  02/03/19 0425 02/04/19 0458 02/05/19 0430  NA 141 137 139 143 147* 154*  K 5.6* 4.0 3.8 3.2* 3.5 3.3*  CL 113* 109 110 114* 119* 126*  CO2 16* 15* 15* 17* 18* 17*  GLUCOSE 149* 143* 113* 129* 172* 181*  BUN 39* 28* 25* 30* 33* 41*  CREATININE 2.28* 1.72* 1.66* 1.41* 1.49* 1.52*  CALCIUM 6.3* 8.5* 8.5* 8.6* 8.7* 8.4*  MG 0.9* 2.2  --  1.6* 2.0 2.1  PHOS  --   --   --  3.4  --  3.2   Estimated Creatinine Clearance: 44.9 mL/min (A) (by C-G formula based on SCr of 1.52 mg/dL (H)). Liver & Pancreas: Recent Labs  Lab 01/31/19 0645 02/01/19 0404 02/03/19 0425 02/04/19 0458 02/05/19 0430 02/05/19 0710  AST 40 94* 145* 366* 263*  --   ALT 22 46* 89* 207* 229*  --   ALKPHOS 64 65 81 101 152*  --   BILITOT 1.4* 2.0* 2.1* 2.8* 2.4* 2.5*  PROT 5.6* 5.7* 5.8* 5.9* 6.0*  --   ALBUMIN 2.1* 2.0* 2.0* 1.9* 2.0*  --    No results for input(s): LIPASE, AMYLASE in the last 168 hours. Recent Labs  Lab 02/04/19 1552 02/05/19 0500  AMMONIA 31 30   Diabetic: No results for input(s): HGBA1C in the last 72 hours. Recent Labs  Lab 02/03/19 2150 02/04/19 0742 02/04/19 1150 02/04/19 1630 02/04/19 2237  GLUCAP 148* 149* 155* 165* 134*   Cardiac Enzymes: Recent Labs  Lab 02/03/19 1033  CKTOTAL 88   No results for input(s): PROBNP in the last 8760 hours. Coagulation Profile: No results for input(s): INR, PROTIME in the last 168 hours. Thyroid Function Tests: Recent Labs    02/04/19 1552  TSH 3.902   Lipid Profile: No results for input(s): CHOL, HDL, LDLCALC, TRIG, CHOLHDL, LDLDIRECT in the last 72 hours. Anemia Panel: No results for input(s): VITAMINB12, FOLATE, FERRITIN, TIBC, IRON, RETICCTPCT in the last 72 hours. Urine analysis:    Component Value Date/Time   COLORURINE YELLOW 02/04/2019 [place] 02/04/2019 0952   LABSPEC 1.006 02/04/2019 0952   PHURINE 6.0 02/04/2019 0952   GLUCOSEU NEGATIVE 02/04/2019 0952   HGBUR MODERATE (A) 02/04/2019 0952    BILIRUBINUR NEGATIVE 02/04/2019 0952   KETONESUR 5 (A) 02/04/2019 0952   PROTEINUR NEGATIVE 02/04/2019 0952   UROBILINOGEN 1.0 12/16/2008 0451   NITRITE NEGATIVE 02/04/2019 0952   LEUKOCYTESUR NEGATIVE 02/04/2019 0952   Sepsis Labs: Invalid input(s): PROCALCITONIN, [place]  Microbiology: Recent Results (from the past 240 hour(s))  SARS Coronavirus 2 [facility] order, Performed in [facility] hospital lab) Nasopharyngeal  Nasopharyngeal Swab     Status: None   Collection Time: 01/10/2019  1:06 PM   Specimen: Nasopharyngeal Swab  Result Value Ref Range Status   SARS Coronavirus 2 NEGATIVE NEGATIVE Final    Comment: (NOTE) If result is NEGATIVE SARS-CoV-2 target nucleic acids are NOT DETECTED. The SARS-CoV-2 RNA is generally detectable in upper and lower  respiratory specimens during the acute phase of infection. The lowest  concentration of SARS-CoV-2 viral copies this assay can detect is 250  copies / mL. A negative result does not preclude SARS-CoV-2 infection  and should not be used as the sole basis for treatment or other  patient management decisions.  A negative result may occur with  improper specimen collection / handling, submission of specimen other  than nasopharyngeal swab, presence of viral mutation(s) within the  areas targeted by this assay, and inadequate number of viral copies  (<250 copies / mL). A negative result must be combined with clinical  observations, patient history, and epidemiological information. If result is POSITIVE SARS-CoV-2 target nucleic acids are DETECTED. The SARS-CoV-2 RNA is generally detectable in upper and lower  respiratory specimens dur ing the acute phase of infection.  Positive  results are indicative of active infection with SARS-CoV-2.  Clinical  correlation with patient history and other diagnostic information is  necessary to determine patient infection status.  Positive results do  not rule out bacterial infection or co-infection  with other viruses. If result is PRESUMPTIVE POSTIVE SARS-CoV-2 nucleic acids MAY BE PRESENT.   A presumptive positive result was obtained on the submitted specimen  and confirmed on repeat testing.  While 2019 novel coronavirus  (SARS-CoV-2) nucleic acids may be present in the submitted sample  additional confirmatory testing may be necessary for epidemiological  and / or clinical management purposes  to differentiate between  SARS-CoV-2 and other Sarbecovirus currently known to infect humans.  If clinically indicated additional testing with an alternate test  methodology [phone]) is advised. The SARS-CoV-2 RNA is generally  detectable in upper and lower respiratory sp ecimens during the acute  phase of infection. The expected result is Negative. Fact Sheet for Patients:  StrictlyIdeas.no Fact Sheet for Healthcare Providers: BankingDealers.co.za This test is not yet approved or cleared by the [place] FDA and has been authorized for detection and/or diagnosis of SARS-CoV-2 by FDA under an Emergency Use Authorization (EUA).  This EUA will remain in effect (meaning this test can be used) for the duration of the COVID-19 declaration under Section 564(b)(1) of the Act, 21 U.S.C. section 360bbb-3(b)(1), unless the authorization is terminated or revoked sooner. Performed at [facility], [place] [address] [place], [place] [address]   Blood culture (routine x 2)     Status: Abnormal   Collection Time: 01/07/2019  1:37 PM   Specimen: BLOOD  Result Value Ref Range Status   Specimen Description   Final    BLOOD PORTA CATH Performed at [facility] [address] [place], [place] [address]    Special Requests   Final    BOTTLES DRAWN AEROBIC AND ANAEROBIC Blood Culture adequate volume Performed at [place] [address], [place], [place] [address]    Culture  Setup Time    Final    GRAM POSITIVE COCCI AEROBIC BOTTLE ONLY CRITICAL RESULT CALLED TO, READ BACK BY AND VERIFIED WITH: PHARMD M SWAAYNE [id number] AT 1217 BY CM GRAM POSITIVE RODS ANAEROBIC BOTTLE ONLY CRITICAL RESULT CALLED TO, READ BACK BY  AND VERIFIED WITH: M. Erskin Burnet [id number] 01/30/2019 T. TYSOR    Culture (A)  Final    STAPHYLOCOCCUS SPECIES (COAGULASE NEGATIVE) THE SIGNIFICANCE OF ISOLATING THIS ORGANISM FROM A SINGLE VENIPUNCTURE CANNOT BE PREDICTED WITHOUT FURTHER CLINICAL AND CULTURE CORRELATION. SUSCEPTIBILITIES AVAILABLE ONLY ON REQUEST. DIPHTHEROIDS(CORYNEBACTERIUM SPECIES) Standardized susceptibility testing for this organism is not available. Performed at [facility], [place] [address] [place], [place] [address]    Report Status 02/03/2019 FINAL  Final  Blood Culture ID Panel (Reflexed)     Status: Abnormal   Collection Time: 01/11/2019  1:37 PM  Result Value Ref Range Status   Enterococcus species NOT DETECTED NOT DETECTED Final   Listeria monocytogenes NOT DETECTED NOT DETECTED Final   Staphylococcus species DETECTED (A) NOT DETECTED Final    Comment: Methicillin (oxacillin) susceptible coagulase negative staphylococcus. Possible blood culture contaminant (unless isolated from more than one blood culture draw or clinical case suggests pathogenicity). No antibiotic treatment is indicated for blood  culture contaminants. CRITICAL RESULT CALLED TO, READ BACK BY AND VERIFIED WITH: PHARMD Shelda Jakes [id number] AT 1217 BY CM    Staphylococcus aureus (BCID) NOT DETECTED NOT DETECTED Final   Methicillin resistance NOT DETECTED NOT DETECTED Final   Streptococcus species NOT DETECTED NOT DETECTED Final   Streptococcus agalactiae NOT DETECTED NOT DETECTED Final   Streptococcus pneumoniae NOT DETECTED NOT DETECTED Final   Streptococcus pyogenes NOT DETECTED NOT DETECTED Final   Acinetobacter baumannii NOT DETECTED NOT DETECTED Final   Enterobacteriaceae species NOT DETECTED NOT DETECTED Final    Enterobacter cloacae complex NOT DETECTED NOT DETECTED Final   Escherichia coli NOT DETECTED NOT DETECTED Final   Klebsiella oxytoca NOT DETECTED NOT DETECTED Final   Klebsiella pneumoniae NOT DETECTED NOT DETECTED Final   Proteus species NOT DETECTED NOT DETECTED Final   Serratia marcescens NOT DETECTED NOT DETECTED Final   Haemophilus influenzae NOT DETECTED NOT DETECTED Final   Neisseria meningitidis NOT DETECTED NOT DETECTED Final   Pseudomonas aeruginosa NOT DETECTED NOT DETECTED Final   Candida albicans NOT DETECTED NOT DETECTED Final   Candida glabrata NOT DETECTED NOT DETECTED Final   Candida krusei NOT DETECTED NOT DETECTED Final   Candida parapsilosis NOT DETECTED NOT DETECTED Final   Candida tropicalis NOT DETECTED NOT DETECTED Final    Comment: Performed at [facility], [address], [place], [place] [address]  Urine culture     Status: None   Collection Time: 01/21/2019  6:00 PM   Specimen: Urine, Clean Catch  Result Value Ref Range Status   Specimen Description   Final    URINE, CLEAN CATCH Performed at [facility], [place] [address] [place], [place] [address]    Special Requests   Final    NONE Performed at [facility], [place] [address], [place], [place] [address]    Culture   Final    NO GROWTH Performed at [facility], [place] [address], [place], [place] [address]    Report Status 01/30/2019 FINAL  Final  MRSA PCR Screening     Status: None   Collection Time: 01/08/2019  7:39 PM   Specimen: Nasopharyngeal  Result Value Ref Range Status   MRSA by PCR NEGATIVE NEGATIVE Final    Comment:        The GeneXpert MRSA Assay (FDA approved for NASAL specimens only), is one component of a comprehensive MRSA colonization surveillance program. It is not intended to diagnose MRSA infection nor to guide  or monitor treatment for MRSA infections. Performed at [facility], [place] [address]  [place], [place] [address]   Culture, Urine     Status: None   Collection Time: 02/04/19  9:52 AM   Specimen: Urine, Catheterized  Result Value Ref Range Status   Specimen Description   Final    URINE, CATHETERIZED Performed at [place] [address] [place], [place] [address]    Special Requests   Final    Immunocompromised Performed at [facility], [place] [address], [place], [place] [address]    Culture   Final    NO GROWTH Performed at [facility], [place] [address] [place], [place] [address]    Report Status 02/05/2019 FINAL  Final    Radiology Studies: US Abdomen Limited Ruq  Result Date: 02/05/2019 CLINICAL DATA:  Elevated LFTs. EXAM: ULTRASOUND ABDOMEN LIMITED RIGHT UPPER QUADRANT COMPARISON:  None. FINDINGS: Gallbladder: No gallstones or wall thickening visualized. No sonographic Murphy sign noted by sonographer. Common bile duct: Diameter: 2.9 mm. Liver: Increased parenchymal echogenicity compatible with steatosis without focal mass. Portal vein is patent on color Doppler imaging with normal direction of blood flow towards the liver. Other: None. IMPRESSION: No acute findings. Suggestion of hepatic steatosis without focal mass. Electronically Signed   By: Marin Olp M.D.   On: 02/05/2019 08:29     Wilbert Schouten T. [place]  If 7PM-7AM, please contact night-coverage [email or web address] Password [email or web address] 02/05/2019, 11:37 AM

## 2019-02-05 NOTE — Consult Note (Signed)
[facility]    Date of Admission:  01/19/2019           Day 9 vancomycin        Day 9 cefepime       Reason for Consult: Automatic consultation for fungemia     Assessment: He has developed fungemia in the setting of metastatic cancer, failure to therapy for presumed pneumonia.  He also has a Port-A-Cath which is a potential source of his bloodstream infection.  The family has discussed goals of care with Dr. Rowe Pavy and the current plan is to continue the current mode of medical care for the next few days to see if he improves.  I will start anidulafungin pending speciation of his yeast.  Normally he would need his Port-A-Cath removed but given potential changes in his goals of care I would leave it in for now.  Plan: 1. Start anidulafungin pending final blood culture results  Principal Problem:   Fungemia Active Problems:   Port-A-Cath in place   Sepsis due to pneumonia ([place])   Neutropenia with fever ([place])   Osseous metastasis (HCC)   Adenocarcinoma of right lung, stage 4 (HCC)   Tobacco abuse disorder   Cancer associated pain   Thrombocytopenia (HCC)   Insulin dependent diabetes mellitus   Hyperkalemia   Metabolic acidosis   AKI (acute kidney injury) ([place])   Anemia associated with chemotherapy   Pressure injury of skin   Scheduled Meds:  Chlorhexidine Gluconate Cloth  6 each Topical Daily   feeding supplement (ENSURE ENLIVE)  237 mL Oral BID BM   Continuous Infusions:  [START ON 02/06/2019] anidulafungin     anidulafungin     PRN Meds:.acetaminophen **OR** acetaminophen, antiseptic oral rinse, glycopyrrolate **OR** glycopyrrolate **OR** glycopyrrolate, haloperidol **OR** haloperidol **OR** haloperidol lactate, LORazepam **OR** LORazepam **OR** LORazepam, morphine CONCENTRATE **OR** morphine CONCENTRATE, ondansetron **OR** ondansetron (ZOFRAN) IV  HPI: Zachary Knox is a 68 y.o. male with metastatic non-small cell lung cancer who  was admitted on 01/11/2019 with failure to thrive and fever.  Chest x-ray showed right-sided infiltrates.  He was started on broad therapy.  He has pancytopenia, neutropenia, renal insufficiency, elevated liver enzymes and bilirubin.  Over the past 36 hours he has become more lethargic with some hypothermia, increased tachycardia and tachypnea.  Blood cultures were repeated yesterday and 1 of 2 peripheral sticks is growing yeast.   Review of Systems: Review of Systems  Unable to perform ROS: Other  Constitutional:       This is a remote consultation so no review of systems was performed.    Past Medical History:  Diagnosis Date   Cancer ([place]) 2020   lung   Diabetes mellitus without complication (HCC)    Dyspnea    increased exertion   Hypertension     Social History   Tobacco Use   Smoking status: Former Smoker    Types: Cigarettes   Smokeless tobacco: Never Used  Substance Use Topics   Alcohol use: Not Currently    Comment: occassional    Drug use: Never    No family history on file. No Known Allergies  OBJECTIVE: Blood pressure (!) 169/107, pulse (!) 108, temperature 98.8 F (37.1 C), temperature source Axillary, resp. rate 17, height 5\' 9"  (1.753 m), weight 68.2 kg, SpO2 98 %.  Physical Exam Constitutional:      Comments: This is a remote consultation so no physical exam was performed  Lab Results Lab Results  Component Value Date   WBC 0.7 (LL) 02/05/2019   HGB 8.9 (L) 02/05/2019   HCT 27.0 (L) 02/05/2019   MCV 94.4 02/05/2019   PLT 16 (LL) 02/05/2019    Lab Results  Component Value Date   CREATININE 1.52 (H) 02/05/2019   BUN 41 (H) 02/05/2019   NA 154 (H) 02/05/2019   K 3.3 (L) 02/05/2019   CL 126 (H) 02/05/2019   CO2 17 (L) 02/05/2019    Lab Results  Component Value Date   ALT 229 (H) 02/05/2019   AST 263 (H) 02/05/2019   ALKPHOS 152 (H) 02/05/2019   BILITOT 2.5 (H) 02/05/2019     Microbiology: Recent Results (from the past 240  hour(s))  SARS Coronavirus 2 [facility] order, Performed in [facility] hospital lab) Nasopharyngeal Nasopharyngeal Swab     Status: None   Collection Time: 01/26/2019  1:06 PM   Specimen: Nasopharyngeal Swab  Result Value Ref Range Status   SARS Coronavirus 2 NEGATIVE NEGATIVE Final    Comment: (NOTE) If result is NEGATIVE SARS-CoV-2 target nucleic acids are NOT DETECTED. The SARS-CoV-2 RNA is generally detectable in upper and lower  respiratory specimens during the acute phase of infection. The lowest  concentration of SARS-CoV-2 viral copies this assay can detect is 250  copies / mL. A negative result does not preclude SARS-CoV-2 infection  and should not be used as the sole basis for treatment or other  patient management decisions.  A negative result may occur with  improper specimen collection / handling, submission of specimen other  than nasopharyngeal swab, presence of viral mutation(s) within the  areas targeted by this assay, and inadequate number of viral copies  (<250 copies / mL). A negative result must be combined with clinical  observations, patient history, and epidemiological information. If result is POSITIVE SARS-CoV-2 target nucleic acids are DETECTED. The SARS-CoV-2 RNA is generally detectable in upper and lower  respiratory specimens dur ing the acute phase of infection.  Positive  results are indicative of active infection with SARS-CoV-2.  Clinical  correlation with patient history and other diagnostic information is  necessary to determine patient infection status.  Positive results do  not rule out bacterial infection or co-infection with other viruses. If result is PRESUMPTIVE POSTIVE SARS-CoV-2 nucleic acids MAY BE PRESENT.   A presumptive positive result was obtained on the submitted specimen  and confirmed on repeat testing.  While 2019 novel coronavirus  (SARS-CoV-2) nucleic acids may be present in the submitted sample  additional confirmatory testing  may be necessary for epidemiological  and / or clinical management purposes  to differentiate between  SARS-CoV-2 and other Sarbecovirus currently known to infect humans.  If clinically indicated additional testing with an alternate test  methodology [phone]) is advised. The SARS-CoV-2 RNA is generally  detectable in upper and lower respiratory sp ecimens during the acute  phase of infection. The expected result is Negative. Fact Sheet for Patients:  StrictlyIdeas.no Fact Sheet for Healthcare Providers: BankingDealers.co.za This test is not yet approved or cleared by the [place] FDA and has been authorized for detection and/or diagnosis of SARS-CoV-2 by FDA under an Emergency Use Authorization (EUA).  This EUA will remain in effect (meaning this test can be used) for the duration of the COVID-19 declaration under Section 564(b)(1) of the Act, 21 U.S.C. section 360bbb-3(b)(1), unless the authorization is terminated or revoked sooner. Performed at [facility], [place] [address], [place], [place] [address]  Blood culture (routine x 2)     Status: Abnormal   Collection Time: 01/27/2019  1:37 PM   Specimen: BLOOD  Result Value Ref Range Status   Specimen Description   Final    BLOOD PORTA CATH Performed at [facility] [address], [place], [place] [address]    Special Requests   Final    BOTTLES DRAWN AEROBIC AND ANAEROBIC Blood Culture adequate volume Performed at [facility] [address] [place], [place] [address]    Culture  Setup Time   Final    GRAM POSITIVE COCCI AEROBIC BOTTLE ONLY CRITICAL RESULT CALLED TO, READ BACK BY AND VERIFIED WITH: PHARMD M SWAAYNE [id number] AT 1217 BY CM GRAM POSITIVE RODS ANAEROBIC BOTTLE ONLY CRITICAL RESULT CALLED TO, READ BACK BY AND VERIFIED WITH: M. Erskin Burnet [id number] 01/30/2019 T. TYSOR    Culture (A)  Final    STAPHYLOCOCCUS  SPECIES (COAGULASE NEGATIVE) THE SIGNIFICANCE OF ISOLATING THIS ORGANISM FROM A SINGLE VENIPUNCTURE CANNOT BE PREDICTED WITHOUT FURTHER CLINICAL AND CULTURE CORRELATION. SUSCEPTIBILITIES AVAILABLE ONLY ON REQUEST. DIPHTHEROIDS(CORYNEBACTERIUM SPECIES) Standardized susceptibility testing for this organism is not available. Performed at [facility], [place] [address], [place], [place] [address]    Report Status 02/03/2019 FINAL  Final  Blood Culture ID Panel (Reflexed)     Status: Abnormal   Collection Time: 01/12/2019  1:37 PM  Result Value Ref Range Status   Enterococcus species NOT DETECTED NOT DETECTED Final   Listeria monocytogenes NOT DETECTED NOT DETECTED Final   Staphylococcus species DETECTED (A) NOT DETECTED Final    Comment: Methicillin (oxacillin) susceptible coagulase negative staphylococcus. Possible blood culture contaminant (unless isolated from more than one blood culture draw or clinical case suggests pathogenicity). No antibiotic treatment is indicated for blood  culture contaminants. CRITICAL RESULT CALLED TO, READ BACK BY AND VERIFIED WITH: PHARMD Shelda Jakes [id number] AT 1217 BY CM    Staphylococcus aureus (BCID) NOT DETECTED NOT DETECTED Final   Methicillin resistance NOT DETECTED NOT DETECTED Final   Streptococcus species NOT DETECTED NOT DETECTED Final   Streptococcus agalactiae NOT DETECTED NOT DETECTED Final   Streptococcus pneumoniae NOT DETECTED NOT DETECTED Final   Streptococcus pyogenes NOT DETECTED NOT DETECTED Final   Acinetobacter baumannii NOT DETECTED NOT DETECTED Final   Enterobacteriaceae species NOT DETECTED NOT DETECTED Final   Enterobacter cloacae complex NOT DETECTED NOT DETECTED Final   Escherichia coli NOT DETECTED NOT DETECTED Final   Klebsiella oxytoca NOT DETECTED NOT DETECTED Final   Klebsiella pneumoniae NOT DETECTED NOT DETECTED Final   Proteus species NOT DETECTED NOT DETECTED Final   Serratia marcescens NOT DETECTED NOT DETECTED Final    Haemophilus influenzae NOT DETECTED NOT DETECTED Final   Neisseria meningitidis NOT DETECTED NOT DETECTED Final   Pseudomonas aeruginosa NOT DETECTED NOT DETECTED Final   Candida albicans NOT DETECTED NOT DETECTED Final   Candida glabrata NOT DETECTED NOT DETECTED Final   Candida krusei NOT DETECTED NOT DETECTED Final   Candida parapsilosis NOT DETECTED NOT DETECTED Final   Candida tropicalis NOT DETECTED NOT DETECTED Final    Comment: Performed at [facility], [address], [place], [place] [address]  Urine culture     Status: None   Collection Time: 01/20/2019  6:00 PM   Specimen: Urine, Clean Catch  Result Value Ref Range Status   Specimen Description   Final    URINE, CLEAN CATCH Performed at [facility], [place] [address] [place],  [place] [address]    Special Requests   Final    NONE Performed at [facility], [place] [address], [place], [place] [address]    Culture   Final    NO GROWTH Performed at [facility], [place] [address], [place], [place] [address]    Report Status 01/30/2019 FINAL  Final  MRSA PCR Screening     Status: None   Collection Time: 01/11/2019  7:39 PM   Specimen: Nasopharyngeal  Result Value Ref Range Status   MRSA by PCR NEGATIVE NEGATIVE Final    Comment:        The GeneXpert MRSA Assay (FDA approved for NASAL specimens only), is one component of a comprehensive MRSA colonization surveillance program. It is not intended to diagnose MRSA infection nor to guide or monitor treatment for MRSA infections. Performed at [facility], [place] [address] [place], [place] [address]   Culture, blood (routine x 2)     Status: Abnormal (Preliminary result)   Collection Time: 02/04/19  9:00 AM   Specimen: BLOOD LEFT ARM  Result Value Ref Range Status   Specimen Description   Final    BLOOD LEFT ARM Performed at [facility] [address], [place], [place] [address]    Special Requests    Final    BOTTLES DRAWN AEROBIC ONLY Blood Culture adequate volume Performed at [facility] [address] [place], [place] [address]    Culture  Setup Time (A)  Final    YEAST AEROBIC BOTTLE ONLY Organism ID to follow    Culture   Final    NO GROWTH 1 DAY Performed at [facility], [place] [address], [place], [place] [address]    Report Status PENDING  Incomplete  Culture, blood (routine x 2)     Status: None (Preliminary result)   Collection Time: 02/04/19  9:00 AM   Specimen: BLOOD LEFT HAND  Result Value Ref Range Status   Specimen Description   Final    BLOOD LEFT HAND Performed at [facility] [address], [place], [place] [address]    Special Requests   Final    BOTTLES DRAWN AEROBIC ONLY Blood Culture results may not be optimal due to an inadequate volume of blood received in culture bottles Performed at [place] [address] [place], [place] [address]    Culture   Final    NO GROWTH 1 DAY Performed at [facility], [place] [address], [place], [place] [address]    Report Status PENDING  Incomplete  Culture, Urine     Status: None   Collection Time: 02/04/19  9:52 AM   Specimen: Urine, Catheterized  Result Value Ref Range Status   Specimen Description   Final    URINE, CATHETERIZED Performed at [facility] [address] [place], [place] [address]    Special Requests   Final    Immunocompromised Performed at [facility], [place] [address], [place], [place] [address]    Culture   Final    NO GROWTH Performed at [facility], [place] [address], [place],  [address]    Report Status 02/05/2019 FINAL  Final    Michel Bickers, MD [facility] [phone] pager   [phone] cell 02/05/2019, 7:04 PM

## 2019-02-05 NOTE — Consult Note (Signed)
Consultation Note Date: 02/05/2019   Patient Name: Zachary Knox  DOB: 1950/06/21  MRN: [id number]  Age / Sex: 68 y.o., male  PCP: Vincente Liberty, MD Referring Physician: Mercy Riding, MD  Reason for Consultation: Establishing goals of care  HPI/Patient Profile: 68 y.o. 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 intake, some cough and shortness of breath.  CXR revealed increased infiltrate on the right side. Admitted for sepsis due to pneumonia, AKI and metabolic acidosis.  Patient transferred to stepdown unit due to worsening mental status.   Clinical Assessment and Goals of Care: Patient remains admitted to hospital medicine service, in stepdown unit for undifferentiated sepsis, noted to have ongoing acute metabolic encephalopathy.   Patient has a serious underlying illness of stage IV lung cancer, metastatic disease to bone. He was noted to be neutropenic, was started on Granix, his oncologist is Dr Earlie Server, he recently received chemotherapy.   I met with patient and wife [name] was at bedside.   Palliative medicine is specialized medical care for people living with serious illness. It focuses on providing relief from the symptoms and stress of a serious illness. The goal is to improve quality of life for both the patient and the family.  Goals of care: Broad aims of medical therapy in relation to the patient's values and preferences. Our aim is to provide medical care aimed at enabling patients to achieve the goals that matter most to them, given the circumstances of their particular medical situation and their constraints.   Patient is not alert, he winces and moans at times. Wife at bedside. Life review, goals,  wishes and values discussed with wife.   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 The patient was using a walker at home, but he was having falls at home. They have a 77 year old grandson who lives with them and they have custody over him.   We talked about the patient's current condition, gave her information about comfort measures. Additionally, the type of care that can be provided in a residential hospice setting also discussed with wife.   See below.   NEXT OF KIN  wife    SUMMARY OF RECOMMENDATIONS    Agree with DNR.  Continue current mode of care, as a time limited trial for the next 2-3 days or so. If the patient has ongoing decline or further decompensation, then he will benefit from residential hospice, discussed this with wife at bedside. She is in agreement.   Code Status/Advance Care Planning:  DNR    Symptom Management:    as above.   Palliative Prophylaxis:   Delirium Protocol   Psycho-social/Spiritual:   Desire for further Chaplaincy support:yes  Additional Recommendations: Caregiving  Support/Resources  Prognosis:   Unable to determine  Discharge Planning: To Be Determined      Primary Diagnoses: Present on Admission: . Sepsis  due to pneumonia ([place]) . Tobacco abuse disorder . Cancer associated pain . Osseous metastasis ([place]) . Adenocarcinoma of right lung, stage 4 ([place]) . Thrombocytopenia ([place]) . Hyperkalemia . Metabolic acidosis . AKI (acute kidney injury) ([place]) . Neutropenia with fever ([place]) . Anemia associated with chemotherapy   I have reviewed the medical record, interviewed the patient and family, and examined the patient. The following aspects are pertinent.  Past Medical History:  Diagnosis Date  . Cancer ([place]) 2020   lung  . Diabetes mellitus without complication ([place])   . Dyspnea    increased exertion  . Hypertension    Social History   Socioeconomic History  . Marital status: Married    Spouse  name: Not on file  . Number of children: Not on file  . Years of education: Not on file  . Highest education level: Not on file  Occupational History  . Not on file  Social Needs  . Financial resource strain: Not on file  . Food insecurity    Worry: Not on file    Inability: Not on file  . Transportation needs    Medical: Not on file    Non-medical: Not on file  Tobacco Use  . Smoking status: Former Smoker    Types: Cigarettes  . Smokeless tobacco: Never Used  Substance and Sexual Activity  . Alcohol use: Not Currently    Comment: occassional   . Drug use: Never  . Sexual activity: Not on file  Lifestyle  . Physical activity    Days per week: Not on file    Minutes per session: Not on file  . Stress: Not on file  Relationships  . Social Herbalist on phone: Not on file    Gets together: Not on file    Attends religious service: Not on file    Active member of club or organization: Not on file    Attends meetings of clubs or organizations: Not on file    Relationship status: Not on file  Other Topics Concern  . Not on file  Social History Narrative  . Not on file   No family history on file. Scheduled Meds: . Chlorhexidine Gluconate Cloth  6 each Topical Daily  . feeding supplement (ENSURE ENLIVE)  237 mL Oral BID BM   Continuous Infusions: PRN Meds:.acetaminophen **OR** acetaminophen, antiseptic oral rinse, glycopyrrolate **OR** glycopyrrolate **OR** glycopyrrolate, haloperidol **OR** haloperidol **OR** haloperidol lactate, LORazepam **OR** LORazepam **OR** LORazepam, morphine CONCENTRATE **OR** morphine CONCENTRATE, ondansetron **OR** ondansetron (ZOFRAN) IV Medications Prior to Admission:  Prior to Admission medications   Medication Sig Start Date End Date Taking? Authorizing Provider  albuterol (VENTOLIN) 2 MG/5ML syrup Take 2 mg by mouth 4 (four) times daily.  12/15/18  Yes [provider]  amLODipine (NORVASC) 5 MG tablet Take 5 mg by mouth  daily. 05/30/18  Yes [provider]  aspirin EC 81 MG tablet Take 81 mg by mouth daily.    Yes [provider]  atorvastatin (LIPITOR) 20 MG tablet Take 20 mg by mouth daily.   Yes [provider]  chlorpheniramine-HYDROcodone (TUSSIONEX) 10-8 MG/5ML SUER Take 5 mLs by mouth every 12 (twelve) hours as needed for cough. 01/12/19  Yes Curt Bears, MD  chlorproMAZINE (THORAZINE) 25 MG tablet Take 1 tablet (25 mg total) by mouth 3 (three) times daily. 01/27/19  Yes Curt Bears, MD  folic acid (FOLVITE) 1 MG tablet Take 1 tablet (1 mg total) by mouth daily. 10/20/18  Yes  Curt Bears, MD  gabapentin (NEURONTIN) 400 MG capsule Take 400 mg by mouth at bedtime.  08/17/18  Yes [provider]  levothyroxine (SYNTHROID, LEVOTHROID) 100 MCG tablet Take 100 mcg by mouth daily. 06/26/18  Yes [provider]  lidocaine-prilocaine (EMLA) cream Apply 1 application topically as needed (before chemo).   Yes [provider]  metFORMIN (GLUCOPHAGE) 1000 MG tablet Take 500-1,000 mg by mouth See admin instructions. Take 1000 mg every morning and 500 mg at lunch and supper   Yes [provider]  metoprolol succinate (TOPROL-XL) 100 MG 24 hr tablet Take 100 mg by mouth 2 (two) times daily. Take with or immediately following a meal.    Yes [provider]  mirtazapine (REMERON) 7.5 MG tablet Take 7.5 mg by mouth at bedtime.  12/16/18  Yes [provider]  pantoprazole (PROTONIX) 40 MG tablet Take 40 mg by mouth 2 (two) times daily. 05/17/18  Yes [provider]  ramipril (ALTACE) 10 MG capsule Take 10 mg by mouth daily. 04/21/18  Yes [provider]  TOUJEO SOLOSTAR 300 UNIT/ML SOPN Inject 18 Units into the skin every morning. 06/16/18  Yes [provider]  amoxicillin-clavulanate (AUGMENTIN) 875-125 MG tablet Take 1 tablet by mouth every 12 (twelve) hours. Patient not taking: Reported on 02/01/2019 12/13/18    Mercy Riding, MD  azithromycin (ZITHROMAX) 250 MG tablet Take two tablets (500 mg) tongiht Patient not taking: Reported on 01/06/2019 12/13/18   Mercy Riding, MD  lidocaine (XYLOCAINE) 2 % solution Use as directed 15 mLs in the mouth or throat every 6 (six) hours as needed (mouth/throat pain). Swish and swallow Patient not taking: Reported on 12/09/2018 09/22/18   Gery Pray, MD  potassium chloride SA (K-DUR) 20 MEQ tablet Take 1 tablet (20 mEq total) by mouth 2 (two) times daily. Patient not taking: Reported on 01/12/2019 12/22/18   Curt Bears, MD  prochlorperazine (COMPAZINE) 10 MG tablet Take 1 tablet (10 mg total) by mouth every 6 (six) hours as needed for nausea or vomiting. Patient not taking: Reported on 08/30/2018 08/20/18   Curt Bears, MD  sucralfate (CARAFATE) 1 GM/10ML suspension Take 10 mLs (1 g total) by mouth 3 (three) times daily as needed. Patient not taking: Reported on 01/30/2019 11/27/18   Quintella Reichert, MD   No Known Allergies Review of Systems Weak and confused.   Physical Exam Appears frail and deconditioned.  Opens eyes, but doesn't talk much Tachycardic Abdomen diffusely tender Shallow regular breaths No edema  Vital Signs: BP (!) 169/107   Pulse (!) 108   Temp 98.8 F (37.1 C) (Axillary)   Resp 17   Ht '5\' 9"'  (1.753 m)   Wt 68.2 kg   SpO2 98%   BMI 22.20 kg/m  Pain Scale: PAINAD POSS *See Group Information*: 1-Acceptable,Awake and alert Pain Score: 0-No pain   SpO2: SpO2: 98 % O2 Device:SpO2: 98 % O2 Flow Rate: .   IO: Intake/output summary:   Intake/Output Summary (Last 24 hours) at 02/05/2019 1256 Last data filed at 02/05/2019 1200 Gross per 24 hour  Intake 1869.37 ml  Output -  Net 1869.37 ml    LBM: Last BM Date: 02/05/19 Baseline Weight: Weight: 74.1 kg Most recent weight: Weight: 68.2 kg     Palliative Assessment/Data:   PPS 30%  Time In:  11 Time Out:  12 Time Total:  60 min.  Greater than 50%  of this time was spent  counseling and coordinating care related to  the above assessment and plan.  Signed by: Loistine Chance, MD [phone] Please contact Palliative Medicine Team phone at [phone] for questions and concerns.  For individual provider: See Shea Evans

## 2019-02-05 NOTE — Progress Notes (Signed)
PHARMACY - PHYSICIAN COMMUNICATION CRITICAL VALUE ALERT - BLOOD CULTURE IDENTIFICATION (BCID)  Zachary Knox is an 68 y.o. male who presented to [facility] on 01/18/2019 with a chief complaint of PNA  Assessment:  Candida albicans bacteremia (sources include PNA vs port  Name of physician (or Provider) Contacted: none, Dr. Cyndia Skeeters previously contacted by [facility] and ID automatically consulted for fungemia - Eraxis started by ID prior to BCID call  Current antibiotics: vanc, cefepime, Eraxis  Changes to prescribed antibiotics recommended:  Patient is on recommended antibiotics - No changes needed - Eraxis can likely be narrowed to Diflucan based on speciation, but will defer to ID since they are seeing patient in the AM  Results for orders placed or performed during the hospital encounter of 01/15/2019  Blood Culture ID Panel (Reflexed) (Collected: 02/04/2019  9:00 AM)  Result Value Ref Range   Enterococcus species NOT DETECTED NOT DETECTED   Listeria monocytogenes NOT DETECTED NOT DETECTED   Staphylococcus species NOT DETECTED NOT DETECTED   Staphylococcus aureus (BCID) NOT DETECTED NOT DETECTED   Streptococcus species NOT DETECTED NOT DETECTED   Streptococcus agalactiae NOT DETECTED NOT DETECTED   Streptococcus pneumoniae NOT DETECTED NOT DETECTED   Streptococcus pyogenes NOT DETECTED NOT DETECTED   Acinetobacter baumannii NOT DETECTED NOT DETECTED   Enterobacteriaceae species NOT DETECTED NOT DETECTED   Enterobacter cloacae complex NOT DETECTED NOT DETECTED   Escherichia coli NOT DETECTED NOT DETECTED   Klebsiella oxytoca NOT DETECTED NOT DETECTED   Klebsiella pneumoniae NOT DETECTED NOT DETECTED   Proteus species NOT DETECTED NOT DETECTED   Serratia marcescens NOT DETECTED NOT DETECTED   Haemophilus influenzae NOT DETECTED NOT DETECTED   Neisseria meningitidis NOT DETECTED NOT DETECTED   Pseudomonas aeruginosa NOT DETECTED NOT DETECTED   Candida albicans DETECTED (A) NOT DETECTED   Candida glabrata NOT DETECTED NOT DETECTED   Candida krusei NOT DETECTED NOT DETECTED   Candida parapsilosis NOT DETECTED NOT DETECTED   Candida tropicalis NOT DETECTED NOT DETECTED    [name] 02/05/2019  9:19 PM

## 2019-02-06 ENCOUNTER — Encounter (HOSPITAL_COMMUNITY): Payer: Self-pay | Admitting: *Deleted

## 2019-02-06 DIAGNOSIS — B49 Unspecified mycosis: Secondary | ICD-10-CM

## 2019-02-06 DIAGNOSIS — R748 Abnormal levels of other serum enzymes: Secondary | ICD-10-CM

## 2019-02-06 DIAGNOSIS — R627 Adult failure to thrive: Secondary | ICD-10-CM

## 2019-02-06 DIAGNOSIS — C7951 Secondary malignant neoplasm of bone: Secondary | ICD-10-CM

## 2019-02-06 DIAGNOSIS — E875 Hyperkalemia: Secondary | ICD-10-CM

## 2019-02-06 DIAGNOSIS — N289 Disorder of kidney and ureter, unspecified: Secondary | ICD-10-CM

## 2019-02-06 DIAGNOSIS — D61818 Other pancytopenia: Secondary | ICD-10-CM

## 2019-02-06 DIAGNOSIS — Z95828 Presence of other vascular implants and grafts: Secondary | ICD-10-CM

## 2019-02-06 DIAGNOSIS — Z87891 Personal history of nicotine dependence: Secondary | ICD-10-CM

## 2019-02-06 DIAGNOSIS — G893 Neoplasm related pain (acute) (chronic): Secondary | ICD-10-CM

## 2019-02-06 LAB — CULTURE, BLOOD (ROUTINE X 2): Special Requests: ADEQUATE

## 2019-02-06 MED ORDER — FLUCONAZOLE IN SODIUM CHLORIDE 400-0.9 MG/200ML-% IV SOLN: 400.0000 mg | INTRAVENOUS | Status: DC

## 2019-02-06 MED ORDER — MORPHINE SULFATE (PF) 2 MG/ML IV SOLN: 2.0000 mg | INTRAVENOUS | Status: DC | PRN

## 2019-02-06 NOTE — Progress Notes (Signed)
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 intake, some cough and shortness of breath.  CXR revealed increased infiltrate on the right side. Admitted for sepsis due to pneumonia, AKI and metabolic acidosis.  He was treated with cefepime and azithromycin for 7 days.  Renal function and metabolic acidosis improved with hydration. Patient also had pancytopenia likely due to recent chemotherapy and sepsis.  He was started on Granix by oncology.  Also received platelet transfusions.  02/04/2019-patient had worsening mental status, tachypnea, tachycardia and low temperature overnight.  Temperature did not improve with warm blankets. Chest x-ray with slightly increased right perihilar infiltrate.  Cultures obtained.  Started on broad-spectrum antibiotics.  Transfer to stepdown.  Temperature improved with bear hugger but remained tachypneic and tachycardic.   02/05/2019-intermittently wakes up but remains somnolent for most part. Looks lethargic.Hypernatremia worsened despite half-normal saline infusion. Still with elevated liver enzymes and total bili. Still with pancytopenia. Discussed with patient's wife at bedside and patient's daughter over the phone about his deteriorating condition.Wife does not think he would like to go for this and likes him to as comfortable as possible for the time he got left. Patient's daughter is in agreement with this.  She is interested in residential hospice. CSW consulted. Comfort measures initiated 10/3.  Subjective: No major events overnight of this morning.  Calm and resting.  Wife at bedside.  Objective: Vitals:   02/05/19 1000  02/05/19 1100 02/06/19 0814 02/06/19 0903  BP: (!) 161/106 (!) 169/107  (!) 125/95  Pulse: (!) 107 (!) 108  (!) 131  Resp: (!) 21 17  (!) 24  Temp:    (!) 97.5 F (36.4 C)  TempSrc:    Oral  SpO2: 97% 98% 95% 98%  Weight:      Height:        Intake/Output Summary (Last 24 hours) at 02/06/2019 1426 Last data filed at 02/06/2019 0336 Gross per 24 hour  Intake 260 ml  Output 800 ml  Net -540 ml   Filed Weights   02/03/19 0500 02/04/19 0500 02/05/19 0426  Weight: 71.1 kg 70.3 kg 68.2 kg    Examination:  GENERAL: No acute distress.  Calm and resting.  Assessment & Plan: Undifferentiated severe sepsis: tachycardic, tachypneic with low temperature with endorgan damage as evidenced by elevated liver enzymes and encephalopathy. Patient is neutropenic.  Unclear source of infection.  Just completed 7 days of IV cefepime and azithromycin for pneumonia.  Chest x-ray with slightly increased right perihilar infiltrate.  Urinalysis is not impressive.  Urine culture negative.  Blood culture grew Candida albicans.  Comfort care already initiated.  Doubt utility to treat fungemia at this point.  Discussed with ID, palliative care and patient's wife. -Continue comfort measures.  Acute metabolic encephalopathy: sleepy but arouses to voice and barely follows command.  No focal neuro deficit.   Hypernatremia: Sodium elevated to 154 despite half-normal saline infusion.  Stage IV non-small cell lung cancer with bone metastasis Pancytopenia-neutropenia  Acute kidney injury on CKD-3/hyperkalemia/metabolic acidosis-renal ultrasound negative for hydronephrosis.  Elevated liver enzymes/alkaline phosphatase/hyperbilirubinemia: Likely due to sepsis.  CK within normal range.  Acute hepatitis panel negative.  RUQ ultrasound negative.  IDDM-2 with renal complication:   Essential hypertension:  Normotensive.  Chronic pain  Pressure ulcer: Stage II sacral wound.  POA.  Plan of care -Comfort  measures -Discharge to residential hospice when bed available.  DVT prophylaxis: None Code Status: DNR/DNI. Family Communication: Updated patient's wife at bedside. Disposition Plan: To residential hospice Consultants: Oncology, palliative care  Procedures:  None  Microbiology summarized: [id number] negative. Blood culture with coag negative staph in 1 of the 2 bottles Urine culture negative.   Antimicrobials: Anti-infectives (From admission, onward)   Start     Dose/Rate Route Frequency Ordered Stop   02/06/19 2000  anidulafungin (ERAXIS) 100 mg in sodium chloride 0.9 % 100 mL IVPB  Status:  Discontinued     100 mg 78 mL/hr over 100 Minutes Intravenous Every 24 hours 02/05/19 1903 02/06/19 1106   02/06/19 1800  fluconazole (DIFLUCAN) IVPB 400 mg  Status:  Discontinued     400 mg 100 mL/hr over 120 Minutes Intravenous Every 24 hours 02/06/19 1120 02/06/19 1132   02/05/19 1915  anidulafungin (ERAXIS) 200 mg in sodium chloride 0.9 % 200 mL IVPB     200 mg 78 mL/hr over 200 Minutes Intravenous  Once 02/05/19 1903 02/06/19 0130   02/05/19 0900  vancomycin (VANCOCIN) IVPB 1000 mg/200 mL premix  Status:  Discontinued     1,000 mg 200 mL/hr over 60 Minutes Intravenous Every 24 hours 02/04/19 0839 02/05/19 1133   02/04/19 0900  ceFEPIme (MAXIPIME) 2 g in sodium chloride 0.9 % 100 mL IVPB  Status:  Discontinued     2 g 200 mL/hr over 30 Minutes Intravenous Every 12 hours 02/04/19 0839 02/05/19 1133   02/04/19 0845  vancomycin (VANCOCIN) 1,500 mg in sodium chloride 0.9 % 500 mL IVPB     1,500 mg 250 mL/hr over 120 Minutes Intravenous  Once 02/04/19 0839 02/04/19 1145   01/31/19 1200  ceFEPIme (MAXIPIME) 2 g in sodium chloride 0.9 % 100 mL IVPB  Status:  Discontinued     2 g 200 mL/hr over 30 Minutes Intravenous Every 12 hours 01/31/19 1058 02/03/19 0928   01/30/19 1400  vancomycin (VANCOCIN) IVPB 1000 mg/200 mL premix  Status:  Discontinued     1,000 mg 200 mL/hr over 60 Minutes  Intravenous Every 48 hours 01/09/2019 1935 01/29/19 1222   01/29/19 1600  ceFEPIme (MAXIPIME) 2 g in sodium chloride 0.9 % 100 mL IVPB  Status:  Discontinued     2 g 200 mL/hr over 30 Minutes Intravenous Every 24 hours 01/29/2019 1932 01/31/19 1058   01/16/2019 2000  azithromycin (ZITHROMAX) 500 mg in sodium chloride 0.9 % 250 mL IVPB  Status:  Discontinued     500 mg 250 mL/hr over 60 Minutes Intravenous Every 24 hours 01/19/2019 1928 02/03/19 0928   01/29/2019 1500  ceFEPIme (MAXIPIME) 2 g in sodium chloride 0.9 % 100 mL IVPB     2 g 200 mL/hr over 30 Minutes Intravenous STAT 01/29/2019 1446 01/19/2019 1910   01/08/2019 1500  vancomycin (VANCOCIN) 1,500 mg in sodium chloride 0.9 % 500 mL IVPB     1,500 mg 250 mL/hr over 120 Minutes Intravenous STAT 01/18/2019 1446 01/24/2019 1719      Sch Meds:  Scheduled Meds:  Chlorhexidine Gluconate Cloth  6 each Topical Daily   feeding supplement (ENSURE ENLIVE)  237 mL Oral BID BM   Continuous Infusions:  PRN Meds:.acetaminophen **OR** acetaminophen, antiseptic oral rinse, glycopyrrolate **OR** glycopyrrolate **OR** glycopyrrolate, haloperidol **OR** haloperidol **OR** haloperidol lactate, LORazepam **OR** LORazepam **OR** LORazepam, morphine injection, morphine CONCENTRATE **OR** morphine  CONCENTRATE, ondansetron **OR** ondansetron (ZOFRAN) IV   I have personally reviewed the following labs and images: CBC: Recent Labs  Lab 02/01/19 0404 02/02/19 0350 02/03/19 0425 02/04/19 0458 02/05/19 0430  WBC 0.2* 0.2* 0.4* 0.5* 0.7*  NEUTROABS  --  0.1*  --  0.3*  --   HGB 7.4* 7.1* 8.6* 8.6* 8.9*  HCT 22.1* 21.7* 25.5* 25.9* 27.0*  MCV 94.8 95.2 93.1 93.5 94.4  PLT 11* 20* 12* 28* 16*   BMP &GFR Recent Labs  Lab 01/31/19 0645 02/01/19 0404 02/03/19 0425 02/04/19 0458 02/05/19 0430  NA 137 139 143 147* 154*  K 4.0 3.8 3.2* 3.5 3.3*  CL 109 110 114* 119* 126*  CO2 15* 15* 17* 18* 17*  GLUCOSE 143* 113* 129* 172* 181*  BUN 28* 25* 30* 33* 41*   CREATININE 1.72* 1.66* 1.41* 1.49* 1.52*  CALCIUM 8.5* 8.5* 8.6* 8.7* 8.4*  MG 2.2  --  1.6* 2.0 2.1  PHOS  --   --  3.4  --  3.2   Estimated Creatinine Clearance: 44.9 mL/min (A) (by C-G formula based on SCr of 1.52 mg/dL (H)). Liver & Pancreas: Recent Labs  Lab 01/31/19 0645 02/01/19 0404 02/03/19 0425 02/04/19 0458 02/05/19 0430 02/05/19 0710  AST 40 94* 145* 366* 263*  --   ALT 22 46* 89* 207* 229*  --   ALKPHOS 64 65 81 101 152*  --   BILITOT 1.4* 2.0* 2.1* 2.8* 2.4* 2.5*  PROT 5.6* 5.7* 5.8* 5.9* 6.0*  --   ALBUMIN 2.1* 2.0* 2.0* 1.9* 2.0*  --    No results for input(s): LIPASE, AMYLASE in the last 168 hours. Recent Labs  Lab 02/04/19 1552 02/05/19 0500  AMMONIA 31 30   Diabetic: No results for input(s): HGBA1C in the last 72 hours. Recent Labs  Lab 02/04/19 0742 02/04/19 1150 02/04/19 1630 02/04/19 2237 02/05/19 0743  GLUCAP 149* 155* 165* 134* 172*   Cardiac Enzymes: Recent Labs  Lab 02/03/19 1033  CKTOTAL 88   No results for input(s): PROBNP in the last 8760 hours. Coagulation Profile: No results for input(s): INR, PROTIME in the last 168 hours. Thyroid Function Tests: Recent Labs    02/04/19 1552  TSH 3.902   Lipid Profile: No results for input(s): CHOL, HDL, LDLCALC, TRIG, CHOLHDL, LDLDIRECT in the last 72 hours. Anemia Panel: No results for input(s): VITAMINB12, FOLATE, FERRITIN, TIBC, IRON, RETICCTPCT in the last 72 hours. Urine analysis:    Component Value Date/Time   COLORURINE YELLOW 02/04/2019 [place] 02/04/2019 0952   LABSPEC 1.006 02/04/2019 0952   PHURINE 6.0 02/04/2019 0952   GLUCOSEU NEGATIVE 02/04/2019 0952   HGBUR MODERATE (A) 02/04/2019 0952   BILIRUBINUR NEGATIVE 02/04/2019 0952   KETONESUR 5 (A) 02/04/2019 0952   PROTEINUR NEGATIVE 02/04/2019 0952   UROBILINOGEN 1.0 12/16/2008 0451   NITRITE NEGATIVE 02/04/2019 0952   LEUKOCYTESUR NEGATIVE 02/04/2019 0952   Sepsis Labs: Invalid input(s):  PROCALCITONIN, [place]  Microbiology: Recent Results (from the past 240 hour(s))  SARS Coronavirus 2 [facility] order, Performed in [facility] hospital lab) Nasopharyngeal Nasopharyngeal Swab     Status: None   Collection Time: 01/10/2019  1:06 PM   Specimen: Nasopharyngeal Swab  Result Value Ref Range Status   SARS Coronavirus 2 NEGATIVE NEGATIVE Final    Comment: (NOTE) If result is NEGATIVE SARS-CoV-2 target nucleic acids are NOT DETECTED. The SARS-CoV-2 RNA is generally detectable in upper and lower  respiratory specimens during the acute phase of  infection. The lowest  concentration of SARS-CoV-2 viral copies this assay can detect is 250  copies / mL. A negative result does not preclude SARS-CoV-2 infection  and should not be used as the sole basis for treatment or other  patient management decisions.  A negative result may occur with  improper specimen collection / handling, submission of specimen other  than nasopharyngeal swab, presence of viral mutation(s) within the  areas targeted by this assay, and inadequate number of viral copies  (<250 copies / mL). A negative result must be combined with clinical  observations, patient history, and epidemiological information. If result is POSITIVE SARS-CoV-2 target nucleic acids are DETECTED. The SARS-CoV-2 RNA is generally detectable in upper and lower  respiratory specimens dur ing the acute phase of infection.  Positive  results are indicative of active infection with SARS-CoV-2.  Clinical  correlation with patient history and other diagnostic information is  necessary to determine patient infection status.  Positive results do  not rule out bacterial infection or co-infection with other viruses. If result is PRESUMPTIVE POSTIVE SARS-CoV-2 nucleic acids MAY BE PRESENT.   A presumptive positive result was obtained on the submitted specimen  and confirmed on repeat testing.  While 2019 novel coronavirus  (SARS-CoV-2) nucleic  acids may be present in the submitted sample  additional confirmatory testing may be necessary for epidemiological  and / or clinical management purposes  to differentiate between  SARS-CoV-2 and other Sarbecovirus currently known to infect humans.  If clinically indicated additional testing with an alternate test  methodology [phone]) is advised. The SARS-CoV-2 RNA is generally  detectable in upper and lower respiratory sp ecimens during the acute  phase of infection. The expected result is Negative. Fact Sheet for Patients:  StrictlyIdeas.no Fact Sheet for Healthcare Providers: BankingDealers.co.za This test is not yet approved or cleared by the [place] FDA and has been authorized for detection and/or diagnosis of SARS-CoV-2 by FDA under an Emergency Use Authorization (EUA).  This EUA will remain in effect (meaning this test can be used) for the duration of the COVID-19 declaration under Section 564(b)(1) of the Act, 21 U.S.C. section 360bbb-3(b)(1), unless the authorization is terminated or revoked sooner. Performed at [facility], [place] [address], [place], [place] [address]   Blood culture (routine x 2)     Status: Abnormal   Collection Time: 01/30/2019  1:37 PM   Specimen: BLOOD  Result Value Ref Range Status   Specimen Description   Final    BLOOD PORTA CATH Performed at [place] [address], [place], [place] [address]    Special Requests   Final    BOTTLES DRAWN AEROBIC AND ANAEROBIC Blood Culture adequate volume Performed at [place] [address], [place], [place] [address]    Culture  Setup Time   Final    GRAM POSITIVE COCCI AEROBIC BOTTLE ONLY CRITICAL RESULT CALLED TO, READ BACK BY AND VERIFIED WITH: PHARMD M SWAAYNE [id number] AT 1217 BY CM GRAM POSITIVE RODS ANAEROBIC BOTTLE ONLY CRITICAL RESULT CALLED TO, READ BACK BY AND VERIFIED WITH: M.  Erskin Burnet [id number] 01/30/2019 T. TYSOR    Culture (A)  Final    STAPHYLOCOCCUS SPECIES (COAGULASE NEGATIVE) THE SIGNIFICANCE OF ISOLATING THIS ORGANISM FROM A SINGLE VENIPUNCTURE CANNOT BE PREDICTED WITHOUT FURTHER CLINICAL AND CULTURE CORRELATION. SUSCEPTIBILITIES AVAILABLE ONLY ON REQUEST. DIPHTHEROIDS(CORYNEBACTERIUM SPECIES) Standardized susceptibility testing for this organism is not available. Performed at [facility], [place],  [place] [address]    Report Status 02/03/2019 FINAL  Final  Blood Culture ID Panel (Reflexed)     Status: Abnormal   Collection Time: 01/07/2019  1:37 PM  Result Value Ref Range Status   Enterococcus species NOT DETECTED NOT DETECTED Final   Listeria monocytogenes NOT DETECTED NOT DETECTED Final   Staphylococcus species DETECTED (A) NOT DETECTED Final    Comment: Methicillin (oxacillin) susceptible coagulase negative staphylococcus. Possible blood culture contaminant (unless isolated from more than one blood culture draw or clinical case suggests pathogenicity). No antibiotic treatment is indicated for blood  culture contaminants. CRITICAL RESULT CALLED TO, READ BACK BY AND VERIFIED WITH: PHARMD Shelda Jakes [id number] AT 1217 BY CM    Staphylococcus aureus (BCID) NOT DETECTED NOT DETECTED Final   Methicillin resistance NOT DETECTED NOT DETECTED Final   Streptococcus species NOT DETECTED NOT DETECTED Final   Streptococcus agalactiae NOT DETECTED NOT DETECTED Final   Streptococcus pneumoniae NOT DETECTED NOT DETECTED Final   Streptococcus pyogenes NOT DETECTED NOT DETECTED Final   Acinetobacter baumannii NOT DETECTED NOT DETECTED Final   Enterobacteriaceae species NOT DETECTED NOT DETECTED Final   Enterobacter cloacae complex NOT DETECTED NOT DETECTED Final   Escherichia coli NOT DETECTED NOT DETECTED Final   Klebsiella oxytoca NOT DETECTED NOT DETECTED Final   Klebsiella pneumoniae NOT DETECTED NOT DETECTED Final   Proteus species NOT  DETECTED NOT DETECTED Final   Serratia marcescens NOT DETECTED NOT DETECTED Final   Haemophilus influenzae NOT DETECTED NOT DETECTED Final   Neisseria meningitidis NOT DETECTED NOT DETECTED Final   Pseudomonas aeruginosa NOT DETECTED NOT DETECTED Final   Candida albicans NOT DETECTED NOT DETECTED Final   Candida glabrata NOT DETECTED NOT DETECTED Final   Candida krusei NOT DETECTED NOT DETECTED Final   Candida parapsilosis NOT DETECTED NOT DETECTED Final   Candida tropicalis NOT DETECTED NOT DETECTED Final    Comment: Performed at [facility], [address] [place], [place] [address]  Urine culture     Status: None   Collection Time: 01/20/2019  6:00 PM   Specimen: Urine, Clean Catch  Result Value Ref Range Status   Specimen Description   Final    URINE, CLEAN CATCH Performed at [facility], [place] [address] [place], [place] [address]    Special Requests   Final    NONE Performed at [facility], [place] [address], [place], [place] [address]    Culture   Final    NO GROWTH Performed at [facility], [place] [address], [place], [place] [address]    Report Status 01/30/2019 FINAL  Final  MRSA PCR Screening     Status: None   Collection Time: 01/08/2019  7:39 PM   Specimen: Nasopharyngeal  Result Value Ref Range Status   MRSA by PCR NEGATIVE NEGATIVE Final    Comment:        The GeneXpert MRSA Assay (FDA approved for NASAL specimens only), is one component of a comprehensive MRSA colonization surveillance program. It is not intended to diagnose MRSA infection nor to guide or monitor treatment for MRSA infections. Performed at [facility], [place] [address] [place], [place] [address]   Culture, blood (routine x 2)     Status: Abnormal (Preliminary result)   Collection Time: 02/04/19  9:00 AM   Specimen: BLOOD LEFT ARM  Result Value Ref Range Status   Specimen Description   Final    BLOOD LEFT ARM Performed at  [facility], [place] [address] [place], [place] [address]    Special Requests   Final    BOTTLES DRAWN AEROBIC ONLY Blood Culture adequate volume Performed at [place] [address] [place], [place] [address]    Culture  Setup Time (A)  Final    YEAST AEROBIC BOTTLE ONLY CRITICAL RESULT CALLED TO, READ BACK BY AND VERIFIED WITH: D. WOFFORD, PHARMD (WL) AT 2035 ON 02/05/19 BY C. JESSUP, MT.    Culture   Final    YEAST IDENTIFICATION TO FOLLOW Performed at [facility], [place] [address], [place], [place] [address]    Report Status PENDING  Incomplete  Culture, blood (routine x 2)     Status: None (Preliminary result)   Collection Time: 02/04/19  9:00 AM   Specimen: BLOOD LEFT HAND  Result Value Ref Range Status   Specimen Description   Final    BLOOD LEFT HAND Performed at [place] [address], [place], [place] [address]    Special Requests   Final    BOTTLES DRAWN AEROBIC ONLY Blood Culture results may not be optimal due to an inadequate volume of blood received in culture bottles Performed at [place] [address], [place], [place] [address]    Culture   Final    NO GROWTH 2 DAYS Performed at [place] [address] [place], [place] [address]    Report Status PENDING  Incomplete  Blood Culture ID Panel (Reflexed)     Status: Abnormal   Collection Time: 02/04/19  9:00 AM  Result Value Ref Range Status   Enterococcus species NOT DETECTED NOT DETECTED Final   Listeria monocytogenes NOT DETECTED NOT DETECTED Final   Staphylococcus species NOT DETECTED NOT DETECTED Final   Staphylococcus aureus (BCID) NOT DETECTED NOT DETECTED Final   Streptococcus species NOT DETECTED NOT DETECTED Final   Streptococcus agalactiae NOT DETECTED NOT DETECTED Final   Streptococcus pneumoniae NOT DETECTED NOT DETECTED Final   Streptococcus pyogenes NOT DETECTED NOT DETECTED Final   Acinetobacter  baumannii NOT DETECTED NOT DETECTED Final   Enterobacteriaceae species NOT DETECTED NOT DETECTED Final   Enterobacter cloacae complex NOT DETECTED NOT DETECTED Final   Escherichia coli NOT DETECTED NOT DETECTED Final   Klebsiella oxytoca NOT DETECTED NOT DETECTED Final   Klebsiella pneumoniae NOT DETECTED NOT DETECTED Final   Proteus species NOT DETECTED NOT DETECTED Final   Serratia marcescens NOT DETECTED NOT DETECTED Final   Haemophilus influenzae NOT DETECTED NOT DETECTED Final   Neisseria meningitidis NOT DETECTED NOT DETECTED Final   Pseudomonas aeruginosa NOT DETECTED NOT DETECTED Final   Candida albicans DETECTED (A) NOT DETECTED Final    Comment: CRITICAL RESULT CALLED TO, READ BACK BY AND VERIFIED WITH: D. WOFFORD, PHARMD (WL) AT 2035 ON 02/05/19 BY C. JESSUP, MT.    Candida glabrata NOT DETECTED NOT DETECTED Final   Candida krusei NOT DETECTED NOT DETECTED Final   Candida parapsilosis NOT DETECTED NOT DETECTED Final   Candida tropicalis NOT DETECTED NOT DETECTED Final    Comment: Performed at [facility], [address], [place], [place] [address]  Culture, Urine     Status: None   Collection Time: 02/04/19  9:52 AM   Specimen: Urine, Catheterized  Result Value Ref Range Status   Specimen Description   Final    URINE, CATHETERIZED Performed at [place] [address], [place],  [address]  Special Requests   Final    Immunocompromised Performed at [place], [place] [address] [place], [place] [address]    Culture   Final    NO GROWTH Performed at [facility], [place] [address], [place], [place] [address]    Report Status 02/05/2019 FINAL  Final    Radiology Studies: No results found.   Desmond Tufano T. [place]  If 7PM-7AM, please contact night-coverage [email or web address] Password [email or web address] 02/06/2019, 2:26 PM

## 2019-02-06 NOTE — Consult Note (Addendum)
Zachary Knox for Infectious Disease    Date of Admission:  01/27/2019           Day 9 anidulafungin               Reason for Consult: Automatic consultation for fungemia     Assessment: He has developed fungemia in the setting of metastatic cancer, failure to thrive and therapy for presumed pneumonia.  He also has a Port-A-Cath which is a potential source of his bloodstream infection.  His wife has discussed goals of care with Dr. Rowe Pavy and the current plan is to continue the current mode of medical care for the next few days to see if he improves.  I will change him to fluconazole which is standard treatment for Candida albicans. Normally he would need his Port-A-Cath removed but given potential changes in his goals of care I would leave it in for now.  Addendum: I discussed the situation with Dr. Cyndia Skeeters.  He said that his discussions with the family yesterday came to the conclusion that transitioning to comfort care was in Zachary Knox best interest.  He is now on comfort measures only and the fluconazole has been stopped.    Plan: 1. Agree with comfort measures only 2. 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 Chlorhexidine Gluconate Cloth  6 each Topical Daily  . feeding supplement (ENSURE ENLIVE)  237 mL Oral BID BM   Continuous Infusions:  PRN Meds:.acetaminophen **OR** acetaminophen, antiseptic oral rinse, glycopyrrolate **OR** glycopyrrolate **OR** glycopyrrolate,  haloperidol **OR** haloperidol **OR** haloperidol lactate, LORazepam **OR** LORazepam **OR** LORazepam, morphine CONCENTRATE **OR** morphine CONCENTRATE, ondansetron **OR** ondansetron (ZOFRAN) IV  HPI: Zachary Knox is a 68 y.o. male with metastatic non-small cell lung cancer who was admitted on 01/08/2019 with failure to thrive and fever.  Chest x-ray showed right-sided infiltrates.  He was started on broad therapy.  He has pancytopenia, neutropenia, renal insufficiency, elevated liver enzymes and bilirubin.  Over the past 48 hours he has become more lethargic with some hypothermia, increased tachycardia and tachypnea.  Blood cultures were repeated yesterday and 1 of 2 peripheral sticks has grown Candidal albicans.  He has a Port-A-Cath.  His urinalysis is normal and his urine culture is negative.   Review of Systems: Review of Systems  Unable to perform ROS: Mental acuity    Past Medical History:  Diagnosis Date  . Cancer ([place]) 2020   lung  . Diabetes mellitus without complication ([place])   . Dyspnea    increased exertion  . Hypertension     Social History   Tobacco Use  . Smoking status: Former Smoker    Types: Cigarettes  . Smokeless tobacco: Never Used  Substance Use Topics  . Alcohol use: Not Currently    Comment: occassional   . Drug use: Never    History reviewed. No pertinent family history. No Known Allergies  OBJECTIVE:  Blood pressure (!) 125/95, pulse (!) 131, temperature (!) 97.5 F (36.4 C), temperature source Oral, resp. rate (!) 24, height 5\' 9"  (1.753 m), weight 68.2 kg, SpO2 98 %.  Physical Exam Constitutional:      Comments: He is sound asleep and does not arouse to voice.  Eyes:     Conjunctiva/sclera: Conjunctivae normal.  Cardiovascular:     Rate and Rhythm: Normal rate and regular rhythm.     Heart sounds: No murmur.  Pulmonary:     Effort: Pulmonary effort is normal.     Breath sounds: Normal breath sounds.  Chest:    Abdominal:      General: There is no distension.     Palpations: Abdomen is soft.  Musculoskeletal:        General: No swelling or tenderness.  Skin:    Findings: No rash.     Comments: Right forearm IV site looks good.     Lab Results Lab Results  Component Value Date   WBC 0.7 (LL) 02/05/2019   HGB 8.9 (L) 02/05/2019   HCT 27.0 (L) 02/05/2019   MCV 94.4 02/05/2019   PLT 16 (LL) 02/05/2019    Lab Results  Component Value Date   CREATININE 1.52 (H) 02/05/2019   BUN 41 (H) 02/05/2019   NA 154 (H) 02/05/2019   K 3.3 (L) 02/05/2019   CL 126 (H) 02/05/2019   CO2 17 (L) 02/05/2019    Lab Results  Component Value Date   ALT 229 (H) 02/05/2019   AST 263 (H) 02/05/2019   ALKPHOS 152 (H) 02/05/2019   BILITOT 2.5 (H) 02/05/2019     Microbiology: Recent Results (from the past 240 hour(s))  SARS Coronavirus 2 [facility] order, Performed in [facility] hospital lab) Nasopharyngeal Nasopharyngeal Swab     Status: None   Collection Time: 01/15/2019  1:06 PM   Specimen: Nasopharyngeal Swab  Result Value Ref Range Status   SARS Coronavirus 2 NEGATIVE NEGATIVE Final    Comment: (NOTE) If result is NEGATIVE SARS-CoV-2 target nucleic acids are NOT DETECTED. The SARS-CoV-2 RNA is generally detectable in upper and lower  respiratory specimens during the acute phase of infection. The lowest  concentration of SARS-CoV-2 viral copies this assay can detect is 250  copies / mL. A negative result does not preclude SARS-CoV-2 infection  and should not be used as the sole basis for treatment or other  patient management decisions.  A negative result may occur with  improper specimen collection / handling, submission of specimen other  than nasopharyngeal swab, presence of viral mutation(s) within the  areas targeted by this assay, and inadequate number of viral copies  (<250 copies / mL). A negative result must be combined with clinical  observations, patient history, and epidemiological information. If  result is POSITIVE SARS-CoV-2 target nucleic acids are DETECTED. The SARS-CoV-2 RNA is generally detectable in upper and lower  respiratory specimens dur ing the acute phase of infection.  Positive  results are indicative of active infection with SARS-CoV-2.  Clinical  correlation with patient history and other diagnostic information is  necessary to determine patient infection status.  Positive results do  not rule out bacterial infection or co-infection with other viruses. If result is PRESUMPTIVE POSTIVE SARS-CoV-2 nucleic acids MAY BE PRESENT.   A presumptive positive result was obtained on the submitted specimen  and confirmed on repeat testing.  While 2019 novel coronavirus  (SARS-CoV-2) nucleic acids may be present in the submitted sample  additional confirmatory  testing may be necessary for epidemiological  and / or clinical management purposes  to differentiate between  SARS-CoV-2 and other Sarbecovirus currently known to infect humans.  If clinically indicated additional testing with an alternate test  methodology [phone]) is advised. The SARS-CoV-2 RNA is generally  detectable in upper and lower respiratory sp ecimens during the acute  phase of infection. The expected result is Negative. Fact Sheet for Patients:  StrictlyIdeas.no Fact Sheet for Healthcare Providers: BankingDealers.co.za This test is not yet approved or cleared by the [place] FDA and has been authorized for detection and/or diagnosis of SARS-CoV-2 by FDA under an Emergency Use Authorization (EUA).  This EUA will remain in effect (meaning this test can be used) for the duration of the COVID-19 declaration under Section 564(b)(1) of the Act, 21 U.S.C. section 360bbb-3(b)(1), unless the authorization is terminated or revoked sooner. Performed at [facility], [place] [address] [place], [place] [address]   Blood culture (routine x 2)      Status: Abnormal   Collection Time: 01/21/2019  1:37 PM   Specimen: BLOOD  Result Value Ref Range Status   Specimen Description   Final    BLOOD PORTA CATH Performed at [place] [address] [place], [place] [address]    Special Requests   Final    BOTTLES DRAWN AEROBIC AND ANAEROBIC Blood Culture adequate volume Performed at [place] [address], [place], [place] [address]    Culture  Setup Time   Final    GRAM POSITIVE COCCI AEROBIC BOTTLE ONLY CRITICAL RESULT CALLED TO, READ BACK BY AND VERIFIED WITH: PHARMD M SWAAYNE [id number] AT 1217 BY CM GRAM POSITIVE RODS ANAEROBIC BOTTLE ONLY CRITICAL RESULT CALLED TO, READ BACK BY AND VERIFIED WITH: M. Erskin Burnet 3151 01/30/2019 T. TYSOR    Culture (A)  Final    STAPHYLOCOCCUS SPECIES (COAGULASE NEGATIVE) THE SIGNIFICANCE OF ISOLATING THIS ORGANISM FROM A SINGLE VENIPUNCTURE CANNOT BE PREDICTED WITHOUT FURTHER CLINICAL AND CULTURE CORRELATION. SUSCEPTIBILITIES AVAILABLE ONLY ON REQUEST. DIPHTHEROIDS(CORYNEBACTERIUM SPECIES) Standardized susceptibility testing for this organism is not available. Performed at [facility], [place] [address], [place], [place] [address]    Report Status 02/03/2019 FINAL  Final  Blood Culture ID Panel (Reflexed)     Status: Abnormal   Collection Time: 01/17/2019  1:37 PM  Result Value Ref Range Status   Enterococcus species NOT DETECTED NOT DETECTED Final   Listeria monocytogenes NOT DETECTED NOT DETECTED Final   Staphylococcus species DETECTED (A) NOT DETECTED Final    Comment: Methicillin (oxacillin) susceptible coagulase negative staphylococcus. Possible blood culture contaminant (unless isolated from more than one blood culture draw or clinical case suggests pathogenicity). No antibiotic treatment is indicated for blood  culture contaminants. CRITICAL RESULT CALLED TO, READ BACK BY AND VERIFIED WITH: PHARMD Shelda Jakes [id number] AT 1217 BY CM     Staphylococcus aureus (BCID) NOT DETECTED NOT DETECTED Final   Methicillin resistance NOT DETECTED NOT DETECTED Final   Streptococcus species NOT DETECTED NOT DETECTED Final   Streptococcus agalactiae NOT DETECTED NOT DETECTED Final   Streptococcus pneumoniae NOT DETECTED NOT DETECTED Final   Streptococcus pyogenes NOT DETECTED NOT DETECTED Final   Acinetobacter baumannii NOT DETECTED NOT DETECTED Final   Enterobacteriaceae species NOT DETECTED NOT DETECTED Final   Enterobacter cloacae complex NOT DETECTED NOT DETECTED Final   Escherichia coli NOT DETECTED NOT DETECTED Final   Klebsiella oxytoca NOT DETECTED NOT DETECTED Final   Klebsiella pneumoniae NOT  DETECTED NOT DETECTED Final   Proteus species NOT DETECTED NOT DETECTED Final   Serratia marcescens NOT DETECTED NOT DETECTED Final   Haemophilus influenzae NOT DETECTED NOT DETECTED Final   Neisseria meningitidis NOT DETECTED NOT DETECTED Final   Pseudomonas aeruginosa NOT DETECTED NOT DETECTED Final   Candida albicans NOT DETECTED NOT DETECTED Final   Candida glabrata NOT DETECTED NOT DETECTED Final   Candida krusei NOT DETECTED NOT DETECTED Final   Candida parapsilosis NOT DETECTED NOT DETECTED Final   Candida tropicalis NOT DETECTED NOT DETECTED Final    Comment: Performed at [facility], [place] [address], [place], [place] [address]  Urine culture     Status: None   Collection Time: 02/01/2019  6:00 PM   Specimen: Urine, Clean Catch  Result Value Ref Range Status   Specimen Description   Final    URINE, CLEAN CATCH Performed at [facility], [place] [address] [place], [place] [address]    Special Requests   Final    NONE Performed at [facility], [place] [address] [place], [place] [address]    Culture   Final    NO GROWTH Performed at [facility], [place] [address] [place], [place] [address]    Report Status 01/30/2019 FINAL  Final  MRSA PCR Screening     Status: None    Collection Time: 01/27/2019  7:39 PM   Specimen: Nasopharyngeal  Result Value Ref Range Status   MRSA by PCR NEGATIVE NEGATIVE Final    Comment:        The GeneXpert MRSA Assay (FDA approved for NASAL specimens only), is one component of a comprehensive MRSA colonization surveillance program. It is not intended to diagnose MRSA infection nor to guide or monitor treatment for MRSA infections. Performed at [facility], [place] [address], [place], [place] [address]   Culture, blood (routine x 2)     Status: Abnormal (Preliminary result)   Collection Time: 02/04/19  9:00 AM   Specimen: BLOOD LEFT ARM  Result Value Ref Range Status   Specimen Description   Final    BLOOD LEFT ARM Performed at [place] [address] [place], [place] [address]    Special Requests   Final    BOTTLES DRAWN AEROBIC ONLY Blood Culture adequate volume Performed at [place] [address], [place], [place] [address]    Culture  Setup Time (A)  Final    YEAST AEROBIC BOTTLE ONLY CRITICAL RESULT CALLED TO, READ BACK BY AND VERIFIED WITH: D. WOFFORD, PHARMD (WL) AT 2035 ON 02/05/19 BY C. JESSUP, MT.    Culture   Final    YEAST IDENTIFICATION TO FOLLOW Performed at [facility], [place] [address] [place], [place] [address]    Report Status PENDING  Incomplete  Culture, blood (routine x 2)     Status: None (Preliminary result)   Collection Time: 02/04/19  9:00 AM   Specimen: BLOOD LEFT HAND  Result Value Ref Range Status   Specimen Description   Final    BLOOD LEFT HAND Performed at [place] [address] [place], [place] [address]    Special Requests   Final    BOTTLES DRAWN AEROBIC ONLY Blood Culture results may not be optimal due to an inadequate volume of blood received in culture bottles Performed at [place] [address], [place], [place] [address]    Culture  Final    NO GROWTH 2 DAYS  Performed at [facility], [place] [address], [place], [place] [address]    Report Status PENDING  Incomplete  Blood Culture ID Panel (Reflexed)     Status: Abnormal   Collection Time: 02/04/19  9:00 AM  Result Value Ref Range Status   Enterococcus species NOT DETECTED NOT DETECTED Final   Listeria monocytogenes NOT DETECTED NOT DETECTED Final   Staphylococcus species NOT DETECTED NOT DETECTED Final   Staphylococcus aureus (BCID) NOT DETECTED NOT DETECTED Final   Streptococcus species NOT DETECTED NOT DETECTED Final   Streptococcus agalactiae NOT DETECTED NOT DETECTED Final   Streptococcus pneumoniae NOT DETECTED NOT DETECTED Final   Streptococcus pyogenes NOT DETECTED NOT DETECTED Final   Acinetobacter baumannii NOT DETECTED NOT DETECTED Final   Enterobacteriaceae species NOT DETECTED NOT DETECTED Final   Enterobacter cloacae complex NOT DETECTED NOT DETECTED Final   Escherichia coli NOT DETECTED NOT DETECTED Final   Klebsiella oxytoca NOT DETECTED NOT DETECTED Final   Klebsiella pneumoniae NOT DETECTED NOT DETECTED Final   Proteus species NOT DETECTED NOT DETECTED Final   Serratia marcescens NOT DETECTED NOT DETECTED Final   Haemophilus influenzae NOT DETECTED NOT DETECTED Final   Neisseria meningitidis NOT DETECTED NOT DETECTED Final   Pseudomonas aeruginosa NOT DETECTED NOT DETECTED Final   Candida albicans DETECTED (A) NOT DETECTED Final    Comment: CRITICAL RESULT CALLED TO, READ BACK BY AND VERIFIED WITH: D. WOFFORD, PHARMD (WL) AT 2035 ON 02/05/19 BY C. JESSUP, MT.    Candida glabrata NOT DETECTED NOT DETECTED Final   Candida krusei NOT DETECTED NOT DETECTED Final   Candida parapsilosis NOT DETECTED NOT DETECTED Final   Candida tropicalis NOT DETECTED NOT DETECTED Final    Comment: Performed at [facility], [address] [place], [place] [address]  Culture, Urine     Status: None   Collection Time: 02/04/19  9:52 AM   Specimen: Urine, Catheterized  Result  Value Ref Range Status   Specimen Description   Final    URINE, CATHETERIZED Performed at [place] [address], [place], [place] [address]    Special Requests   Final    Immunocompromised Performed at [facility], [place] [address], [place], [place] [address]    Culture   Final    NO GROWTH Performed at [facility], [place] [address], [place], [place] [address]    Report Status 02/05/2019 FINAL  Final    Michel Bickers, MD [place] [facility] [place] [facility] [phone] pager   [phone] cell 02/06/2019, 11:06 AM

## 2019-02-06 NOTE — Progress Notes (Signed)
Pharmacy Antibiotic Note  Zachary Knox is a 68 y.o. male with metastatic state IV NSCL on chemotherapy treatment PTA, presented to the ED on  01/26/2019 with c/o weakness.  Pharmacy is familiar with patient from abx consults. Abx for sepsis/PNA were d/ced on 10/1. Per Dr. Juliann Pares request on 10/2, continue with cefepime and resume vancomycin.  Antimicrobials this admission: 9/25 cefepime>10/1 >> resumed 10/2>> 10/3 9/25 vanc> 9/26>> resumed on 10/2>> 10/3 9/25 azith>>10/1 10/3 Eraxis >> 10/4 10/4 Fluconazole >>  Microbiology results: 9/25 UCx: ng-final 9/25 BCx1 from [facility] (no peripheral cx): CoNS & 1/2 GPR in aerobic bottle: both likely colonizing Port 9/25 MRSA PCR: neg 10/2 UCx: ng-final 10/2 BCx: Candida Albicans 1/2 sets  Plan: [name] discontinued Begin Fluconazole 400mg  IV q24  Height: 5\' 9"  (175.3 cm) Weight: 150 lb 5.7 oz (68.2 kg) IBW/kg (Calculated) : 70.7  Temp (24hrs), Avg:97.5 F (36.4 C), Min:97.5 F (36.4 C), Max:97.5 F (36.4 C)  Recent Labs  Lab 01/31/19 0645 02/01/19 0404 02/02/19 0350 02/03/19 0425 02/04/19 0458 02/05/19 0430  WBC 0.2* 0.2* 0.2* 0.4* 0.5* 0.7*  CREATININE 1.72* 1.66*  --  1.41* 1.49* 1.52*    Estimated Creatinine Clearance: 44.9 mL/min (A) (by C-G formula based on SCr of 1.52 mg/dL (H)).    No Known Allergies  Thank you for allowing pharmacy to be a part of this patient's care.  Minda Ditto PharmD 02/06/2019 11:09 AM

## 2019-02-06 NOTE — Progress Notes (Signed)
Pt's daughter, [name], and granddaughter, Denton Ar, were present when I arrived. They seemed at peace with Mr. Lovejoy impending transition. Tiffany wanted prayer for her father and confirmation of his salvation. She told me how he was baptized and after discussion of same, she said she believes he did believe. Pt's wife was not present during our visit but was expected to return. I encouraged them to have me paged if she would also like a visit. Please page if additional support is needed. [place], [place]   02/06/19 1300  Clinical Encounter Type  Visited With Family

## 2019-02-06 NOTE — Progress Notes (Signed)
[place]  Received request from [place] for family interest in [place]. Chart reviewed and eligibility has been confirmed. Unfortunately [facility] is not able to offer a room this afternoon. [facility] hospital liaison will update family and staff regarding availability tomorrow.   Thank you,  Erling Conte, LCSW [phone]

## 2019-02-06 NOTE — TOC Progression Note (Addendum)
Transition of Care [facility]) - Progression Note    Patient Details  Name: Zachary Knox MRN: [id number] Date of Birth: [birth year]  [facility] CM/SW Contact  Servando Snare,  Phone Number: 02/06/2019, 11:24 AM  Clinical Narrative:   Per attending patient and family wanting residential hospice. LCSW made referral to [facility] place. No beds today.      Barriers to Discharge: Continued Medical Work up  Expected Discharge Plan and Services                                     HH Arranged: PT, OT, Nurse's Aide [place] Agency: [facility] Date [place]: 02/02/19 Time [place]: 1545 Representative spoke with at [place]: [place] ([place]) Interventions    Readmission Risk Interventions No flowsheet data found.

## 2019-02-06 NOTE — Progress Notes (Signed)
Daily Progress Note   Patient Name: Zachary Knox       Date: 02/06/2019 DOB: 11-17-1950  Age: 68 y.o. MRN#: [id number] Attending Physician: Mercy Riding, MD Primary Care Physician: Vincente Liberty, MD Admit Date: 01/25/2019  Reason for Consultation/Follow-up: Terminal Care  Subjective:  patient essentially unresponsive, on comfort care measures, awaiting transfer to residential hospice.   Length of Stay: 9  Current Medications: Scheduled Meds:  . Chlorhexidine Gluconate Cloth  6 each Topical Daily  . feeding supplement (ENSURE ENLIVE)  237 mL Oral BID BM    Continuous Infusions:   PRN Meds: acetaminophen **OR** acetaminophen, antiseptic oral rinse, glycopyrrolate **OR** glycopyrrolate **OR** glycopyrrolate, haloperidol **OR** haloperidol **OR** haloperidol lactate, LORazepam **OR** LORazepam **OR** LORazepam, morphine injection, morphine CONCENTRATE **OR** morphine CONCENTRATE, ondansetron **OR** ondansetron (ZOFRAN) IV  Physical Exam         Frail and weak appearing Not as alert, not as awake Shallow regular breathing S1 S2 Abdomen is soft, non tender S1 S2 No edema  Vital Signs: BP (!) 125/95 (BP Location: Left Arm)   Pulse (!) 131   Temp (!) 97.5 F (36.4 C) (Oral)   Resp (!) 24   Ht 5\' 9"  (1.753 m)   Wt 68.2 kg   SpO2 98%   BMI 22.20 kg/m  SpO2: SpO2: 98 % O2 Device: O2 Device: Room Air O2 Flow Rate:    Intake/output summary:   Intake/Output Summary (Last 24 hours) at 02/06/2019 1215 Last data filed at 02/06/2019 0336 Gross per 24 hour  Intake 260 ml  Output 800 ml  Net -540 ml   LBM: Last BM Date: 02/05/19 Baseline Weight: Weight: 74.1 kg Most recent weight: Weight: 68.2 kg       Palliative Assessment/Data:      Patient Active Problem  List   Diagnosis Date Noted  . Fungemia 02/05/2019  . Pressure injury of skin 01/29/2019  . Sepsis due to pneumonia ([place]) 01/27/2019  . Hyperkalemia 01/07/2019  . Metabolic acidosis 78/29/5621  . AKI (acute kidney injury) ([place]) 01/20/2019  . Neutropenia with fever ([place]) 01/17/2019  . Anemia associated with chemotherapy 02/02/2019  . Uncomplicated sepsis ([place]) 12/09/2018  . Acute metabolic encephalopathy   . Primary malignant neoplasm of lung metastatic to other site [facility])   . Insulin dependent diabetes mellitus   .  Thrombocytopenia ([place]) 11/29/2018  . Hypokalemia 11/29/2018  . Hyponatremia 11/29/2018  . Port-A-Cath in place 11/17/2018  . Encounter for antineoplastic immunotherapy 10/20/2018  . Adenocarcinoma of right lung, stage 4 ([place]) 08/19/2018  . Encounter for antineoplastic chemotherapy 08/19/2018  . Goals of care, counseling/discussion 08/19/2018  . 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 Tobacco abuse disorder 07/12/2018  . Alcohol abuse 07/12/2018    Palliative Care Assessment & Plan   Patient Profile:    Assessment:  Fungemia Metastatic cancer Failure to thrive PNA Sepsis  Recommendations/Plan:   Agree with comfort care, end of life orders noted.   Will add IV Morphine PRN and monitor.   Await transfer to residential hospice  Comfort cart for family please  Chaplain consult for additional comfort.   Prognosis hours to days.   Goals of Care and Additional Recommendations:  Limitations on Scope of Treatment: Full Comfort Care  Code Status:    Code Status Orders  (From admission, onward)         Start     Ordered   02/05/19 1132  Do not attempt resuscitation (DNR)  Continuous    Question Answer Comment  In the event of cardiac or respiratory ARREST Do not call a "code blue"   In the event of cardiac or respiratory ARREST Do not perform Intubation, CPR, defibrillation or ACLS   In the event of cardiac or  respiratory ARREST Use medication by any route, position, wound care, and other measures to relive pain and suffering. May use oxygen, suction and manual treatment of airway obstruction as needed for comfort.      02/05/19 1133        Code Status History    Date Active Date Inactive Code Status Order ID Comments User Context   02/03/2019 3710 02/05/2019 1133 DNR [id number]  Mercy Riding, MD Inpatient   01/06/2019 1733 02/03/2019 1649 Full Code [id number]  Lavina Hamman, MD ED   12/09/2018 2027 12/13/2018 1357 DNR [id number]  Florencia Reasons, MD ED   11/29/2018 0333 11/29/2018 1747 Full Code [id number]  Jani Gravel, MD ED   Advance Care Planning Activity       Prognosis:   Hours - Days  Discharge Planning:  Hospice facility  Care plan was discussed with  Patient's daughter and grand son who are in the room.   Thank you for allowing the Palliative Medicine Team to assist in the care of this patient.   Time In: 11 Time Out: 11.25 Total Time 25 Prolonged Time Billed  no       Greater than 50%  of this time was spent counseling and coordinating care related to the above assessment and plan.  Loistine Chance, MD [phone] Please contact Palliative Medicine Team phone at [phone] for questions and concerns.

## 2019-02-09 ENCOUNTER — Other Ambulatory Visit: Payer: Medicare Other

## 2019-02-09 ENCOUNTER — Ambulatory Visit: Payer: Medicare Other

## 2019-02-09 ENCOUNTER — Ambulatory Visit: Payer: Medicare Other | Admitting: Internal Medicine

## 2019-02-09 ENCOUNTER — Encounter: Payer: Medicare Other | Admitting: Nutrition

## 2019-02-09 LAB — CULTURE, BLOOD (ROUTINE X 2): Culture: NO GROWTH

## 2019-03-02 ENCOUNTER — Ambulatory Visit: Payer: Medicare Other | Admitting: Internal Medicine

## 2019-03-02 ENCOUNTER — Other Ambulatory Visit: Payer: Medicare Other

## 2019-03-02 ENCOUNTER — Ambulatory Visit: Payer: Medicare Other

## 2019-03-06 NOTE — Progress Notes (Signed)
Family leaving at this time. Will provide post-mortem care and day shift to help assist moving patient to morgue. Zachary Knox

## 2019-03-06 NOTE — Death Summary Note (Signed)
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 intake, some cough and shortness of breath.  CXR revealed increased infiltrate on the right side. Admitted for sepsis due to pneumonia, AKI and metabolic acidosis.  He was treated with cefepime and azithromycin for 7 days.  Renal function and metabolic acidosis improved with hydration. Patient also had pancytopenia likely due to recent chemotherapy and sepsis.  He was started on Granix by oncology.  Also received platelet transfusions.  02/04/2019-patient had worsening mental status, tachypnea, tachycardia and low temperature overnight.  Temperature did not improve with warm blankets. Chest x-ray with slightly increased right perihilar infiltrate.  Cultures obtained.  Started on broad-spectrum antibiotics.  Transfer to stepdown.  Temperature improved with bear hugger but remained tachypneic and tachycardic.   05-Mar-2019-intermittently wakes up but remains somnolent for most part.  Looks lethargic.  Hypernatremia worsened despite half-normal saline infusion.  Still with elevated liver enzymes and total bili.  Still with pancytopenia.  Discussed with patient's wife at bedside and patient's daughter over the phone about his deteriorating condition.  Wife does not think he would like to go for this and likes him to as comfortable as possible for the time he got left. Patient's daughter is in agreement with this.  She is interested in residential hospice.  CSW consulted.  Comfort measures initiated 03/05/2023.  Patient passed away peacefully on 03-07-19 at 4:37 AM.   Discharge Diagnoses:  Severe sepsis  likely due to fungemia: tachycardic, tachypneic with low temperature with endorgan damage as evidenced by elevated liver enzymes and encephalopathy. Patient is neutropenic.  Unclear source of infection.  Just completed 7 days of IV cefepime and azithromycin for pneumonia.  Chest x-ray with slightly increased right perihilar infiltrate.  Urinalysis is not impressive.  Urine culture negative.  Blood culture with Candida albicans.  Acute metabolic encephalopathy: sleepy but arouses to voice and barely follows command.  No focal neuro deficit.   Hypernatremia: Sodium elevated to 154 despite half-normal saline infusion.  Stage IV non-small cell lung cancer with bone metastasis Pancytopenia-neutropenia  Acute kidney injury on CKD-3/hyperkalemia/metabolic acidosis-renal ultrasound negative for hydronephrosis.  Elevated liver enzymes/alkaline phosphatase/hyperbilirubinemia: Likely due to sepsis.  CK within normal range.  Acute hepatitis panel negative.  RUQ ultrasound negative.  IDDM-2 with renal complication:   Essential hypertension: Normotensive.  Chronic pain  Pressure ulcer: Stage II sacral wound.  POA.  Discharge Instructions None  Consultations:  Infectious disease  Oncology  Palliative care  Procedures/Studies:  2D Echo:   1. Left ventricular ejection fraction, by visual estimation, is 50 to 55%. The left ventricle has normal function. There is mildly increased left ventricular hypertrophy. Incoordinate septal motion.  2. Left ventricular diastolic Doppler parameters are consistent with impaired relaxation pattern of LV diastolic filling.  3. Global right ventricle has normal systolic function.The right ventricular size is normal. No increase in right ventricular wall thickness.  4. Left atrial size was normal.  5. Right atrial size was normal.  6. The mitral valve is grossly normal. Trace mitral valve regurgitation.  7. The tricuspid valve is grossly normal.  Tricuspid valve regurgitation was not visualized by color flow Doppler.  8. The aortic valve is tricuspid Aortic valve regurgitation was not  visualized by color flow Doppler.  9. The pulmonic valve was grossly normal. Pulmonic valve regurgitation is not visualized by color flow Doppler. 10. The interatrial septum was not well visualized.  US Renal  Result Date: 01/18/2019 CLINICAL DATA:  Renal failure EXAM: RENAL / URINARY TRACT ULTRASOUND COMPLETE COMPARISON:  CT scan June 08, 2017 FINDINGS: Right Kidney: Renal measurements: 11.4 x 5.6 x 5.2 cm = volume: 173.7 mL . Echogenicity within normal limits. No mass or hydronephrosis visualized. Left Kidney: Renal measurements: 11.6 x 6.2 x 5.7 cm = volume: 213 mL. Contains a 1.7 cm cyst seen on previous CT imaging. Bladder: Appears normal for degree of bladder distention. IMPRESSION: 1. No cause for the patient's renal failure identified. A single left-sided renal cyst is noted, better assessed on previous CT imaging. Electronically Signed   By: Dorise Bullion III M.D   On: 01/31/2019 14:32   Dg Chest Port 1 View  Result Date: 02/04/2019 CLINICAL DATA:  Dyspnea, diabetes mellitus, hypertension, lung cancer EXAM: PORTABLE CHEST 1 VIEW COMPARISON:  Portable exam 7253 hours compared to 01/27/2019 Correlation: CT chest abdomen pelvis 12/11/2018 FINDINGS: RIGHT jugular Port-A-Cath with tip projecting over SVC. Kyphotic positioning. Normal heart size and pulmonary vascularity. Abnormal soft tissue density again identified at the RIGHT hilum, corresponding to vascular structures and perihilar infiltrates on prior CT. Mild RIGHT perihilar infiltrate slightly increased, question pneumonia versus sequela of radiation therapy if patient has such history. Remaining lungs clear. Overall volume loss in the RIGHT hemithorax versus LEFT. No pleural effusion or pneumothorax. No acute osseous findings. IMPRESSION: Slightly increased RIGHT perihilar infiltrate, question  pneumonia versus radiation therapy change, recommend correlation with history. Electronically Signed   By: Lavonia Dana M.D.   On: 02/04/2019 08:24   Dg Chest Port 1 View  Result Date: 01/18/2019 CLINICAL DATA:  Hypertension, lung cancer, diabetes mellitus, hypertension EXAM: PORTABLE CHEST 1 VIEW COMPARISON:  Portable exam 1343 hours compared to 12/09/2018 FINDINGS: Volume loss in the RIGHT hemithorax 6 with mild mediastinal shift from LEFT to RIGHT. RIGHT jugular Port-A-Cath with tip projecting over RIGHT atrium. Normal heart size and pulmonary vascularity. Mild patchy RIGHT perihilar and RIGHT upper lobe infiltrates. LEFT lung clear. Elevation of RIGHT diaphragm. No pleural effusion or pneumothorax. IMPRESSION: Patchy RIGHT perihilar and RIGHT upper lobe infiltrates. LEFT lung clear. Electronically Signed   By: Lavonia Dana M.D.   On: 01/18/2019 14:20   Dg Abd Portable 1v  Result Date: 01/30/2019 CLINICAL DATA:  Abdominal pain and constipation. EXAM: PORTABLE ABDOMEN - 1 VIEW COMPARISON:  None. FINDINGS: The bowel gas pattern is nonobstructive. The amount of fecal material in the colon is within normal limits. No other acute abnormalities identified. IMPRESSION: No evidence of constipation.  Nonobstructive bowel gas pattern. Electronically Signed   By: Dorise Bullion III M.D   On: 01/30/2019 15:56   US Abdomen Limited Ruq  Result Date: 02/05/2019 CLINICAL DATA:  Elevated LFTs. EXAM: ULTRASOUND ABDOMEN LIMITED RIGHT UPPER QUADRANT COMPARISON:  None. FINDINGS: Gallbladder: No gallstones or wall thickening visualized. No sonographic Murphy sign noted by sonographer. Common bile duct: Diameter: 2.9 mm. Liver: Increased parenchymal echogenicity compatible with steatosis without focal mass. Portal vein is patent on color Doppler imaging with normal direction of blood flow towards the liver. Other: None. IMPRESSION: No acute findings. Suggestion of hepatic steatosis without focal mass. Electronically Signed    By: Marin Olp M.D.   On: 02/05/2019 08:29     Subjective: Patient passed away overnight.   Discharge Exam:  Vitals:   02/06/19 0814 02/06/19 0903  BP:  (!) 125/95  Pulse:  (!) 131  Resp:  (!) 24  Temp:  (!) 97.5 F (36.4 C)  SpO2: 95% 98%    Patient passed away overnight.  The results of significant diagnostics from this hospitalization (including imaging, microbiology, ancillary and laboratory) are listed below for reference.     Microbiology: Recent Results (from the past 240 hour(s))  Blood culture (routine x 2)     Status: Abnormal   Collection Time: 01/05/2019  1:37 PM   Specimen: BLOOD  Result Value Ref Range Status   Specimen Description   Final    BLOOD PORTA CATH Performed at [facility] [address] [place], [place] [address]    Special Requests   Final    BOTTLES DRAWN AEROBIC AND ANAEROBIC Blood Culture adequate volume Performed at [facility] [address] [place], [place] [address]    Culture  Setup Time   Final    GRAM POSITIVE COCCI AEROBIC BOTTLE ONLY CRITICAL RESULT CALLED TO, READ BACK BY AND VERIFIED WITH: PHARMD M SWAAYNE [id number] AT 1217 BY CM GRAM POSITIVE RODS ANAEROBIC BOTTLE ONLY CRITICAL RESULT CALLED TO, READ BACK BY AND VERIFIED WITH: M. Erskin Burnet 1308 01/30/2019 T. TYSOR    Culture (A)  Final    STAPHYLOCOCCUS SPECIES (COAGULASE NEGATIVE) THE SIGNIFICANCE OF ISOLATING THIS ORGANISM FROM A SINGLE VENIPUNCTURE CANNOT BE PREDICTED WITHOUT FURTHER CLINICAL AND CULTURE CORRELATION. SUSCEPTIBILITIES AVAILABLE ONLY ON REQUEST. DIPHTHEROIDS(CORYNEBACTERIUM SPECIES) Standardized susceptibility testing for this organism is not available. Performed at [facility], [place] [address], [place],  [address]    Report Status 02/03/2019 FINAL  Final  Blood Culture ID Panel (Reflexed)     Status: Abnormal   Collection Time: 01/14/2019  1:37 PM  Result Value Ref Range Status   Enterococcus  species NOT DETECTED NOT DETECTED Final   Listeria monocytogenes NOT DETECTED NOT DETECTED Final   Staphylococcus species DETECTED (A) NOT DETECTED Final    Comment: Methicillin (oxacillin) susceptible coagulase negative staphylococcus. Possible blood culture contaminant (unless isolated from more than one blood culture draw or clinical case suggests pathogenicity). No antibiotic treatment is indicated for blood  culture contaminants. CRITICAL RESULT CALLED TO, READ BACK BY AND VERIFIED WITH: PHARMD Shelda Jakes [id number] AT 1217 BY CM    Staphylococcus aureus (BCID) NOT DETECTED NOT DETECTED Final   Methicillin resistance NOT DETECTED NOT DETECTED Final   Streptococcus species NOT DETECTED NOT DETECTED Final   Streptococcus agalactiae NOT DETECTED NOT DETECTED Final   Streptococcus pneumoniae NOT DETECTED NOT DETECTED Final   Streptococcus pyogenes NOT DETECTED NOT DETECTED Final   Acinetobacter baumannii NOT DETECTED NOT DETECTED Final   Enterobacteriaceae species NOT DETECTED NOT DETECTED Final   Enterobacter cloacae complex NOT DETECTED NOT DETECTED Final   Escherichia coli NOT DETECTED NOT DETECTED Final   Klebsiella oxytoca NOT DETECTED NOT DETECTED Final   Klebsiella pneumoniae NOT DETECTED NOT DETECTED Final   Proteus species NOT DETECTED NOT DETECTED Final   Serratia marcescens NOT DETECTED NOT DETECTED Final   Haemophilus influenzae NOT DETECTED NOT DETECTED Final   Neisseria meningitidis NOT DETECTED NOT DETECTED Final   Pseudomonas aeruginosa NOT DETECTED NOT DETECTED Final   Candida albicans NOT DETECTED NOT DETECTED Final   Candida glabrata NOT DETECTED NOT DETECTED Final   Candida krusei NOT DETECTED NOT DETECTED Final   Candida parapsilosis NOT DETECTED NOT DETECTED Final   Candida tropicalis NOT DETECTED  NOT DETECTED Final    Comment: Performed at [facility], [place] [address] [place], [place] [address]  Urine culture     Status: None   Collection Time: 01/06/2019  6:00  PM   Specimen: Urine, Clean Catch  Result Value Ref Range Status   Specimen Description   Final    URINE, CLEAN CATCH Performed at [facility], [place] [address] [place], [place] [address]    Special Requests   Final    NONE Performed at [facility], [place] [address], [place], [place] [address]    Culture   Final    NO GROWTH Performed at [facility], [place] [address], [place], [place] [address]    Report Status 01/30/2019 FINAL  Final  MRSA PCR Screening     Status: None   Collection Time: 01/21/2019  7:39 PM   Specimen: Nasopharyngeal  Result Value Ref Range Status   MRSA by PCR NEGATIVE NEGATIVE Final    Comment:        The GeneXpert MRSA Assay (FDA approved for NASAL specimens only), is one component of a comprehensive MRSA colonization surveillance program. It is not intended to diagnose MRSA infection nor to guide or monitor treatment for MRSA infections. Performed at [facility], [place] [address], [place], [place] [address]   Culture, blood (routine x 2)     Status: Abnormal   Collection Time: 02/04/19  9:00 AM   Specimen: BLOOD LEFT ARM  Result Value Ref Range Status   Specimen Description   Final    BLOOD LEFT ARM Performed at [facility], [address] [place], [place] [address]    Special Requests   Final    BOTTLES DRAWN AEROBIC ONLY Blood Culture adequate volume Performed at [facility] [address] [place], [place] [address]    Culture  Setup Time (A)  Final    YEAST AEROBIC BOTTLE ONLY CRITICAL RESULT CALLED TO, READ BACK BY AND VERIFIED WITH: D. WOFFORD, PHARMD (WL) AT 2035 ON 02/05/19 BY C. JESSUP, MT. Performed at [facility], [place] [address] [place], [place] [address]    Culture CANDIDA ALBICANS (A)  Final   Report Status 02/06/2019 FINAL  Final  Culture, blood (routine x 2)     Status: None (Preliminary result)   Collection Time: 02/04/19  9:00 AM     Specimen: BLOOD LEFT HAND  Result Value Ref Range Status   Specimen Description   Final    BLOOD LEFT HAND Performed at [facility] [address] [place], [place] [address]    Special Requests   Final    BOTTLES DRAWN AEROBIC ONLY Blood Culture results may not be optimal due to an inadequate volume of blood received in culture bottles Performed at [facility] [address], [place], [place] [address]    Culture   Final    NO GROWTH 3 DAYS Performed at [facility], [place] [address] [place], [place] [address]    Report Status PENDING  Incomplete  Blood Culture ID Panel (Reflexed)     Status: Abnormal   Collection Time: 02/04/19  9:00 AM  Result Value Ref Range Status   Enterococcus species NOT DETECTED NOT DETECTED Final   Listeria monocytogenes NOT DETECTED NOT DETECTED Final   Staphylococcus species NOT DETECTED NOT DETECTED Final   Staphylococcus aureus (BCID) NOT DETECTED NOT DETECTED Final  Streptococcus species NOT DETECTED NOT DETECTED Final   Streptococcus agalactiae NOT DETECTED NOT DETECTED Final   Streptococcus pneumoniae NOT DETECTED NOT DETECTED Final   Streptococcus pyogenes NOT DETECTED NOT DETECTED Final   Acinetobacter baumannii NOT DETECTED NOT DETECTED Final   Enterobacteriaceae species NOT DETECTED NOT DETECTED Final   Enterobacter cloacae complex NOT DETECTED NOT DETECTED Final   Escherichia coli NOT DETECTED NOT DETECTED Final   Klebsiella oxytoca NOT DETECTED NOT DETECTED Final   Klebsiella pneumoniae NOT DETECTED NOT DETECTED Final   Proteus species NOT DETECTED NOT DETECTED Final   Serratia marcescens NOT DETECTED NOT DETECTED Final   Haemophilus influenzae NOT DETECTED NOT DETECTED Final   Neisseria meningitidis NOT DETECTED NOT DETECTED Final   Pseudomonas aeruginosa NOT DETECTED NOT DETECTED Final   Candida albicans DETECTED (A) NOT DETECTED Final    Comment: CRITICAL RESULT CALLED TO, READ BACK BY  AND VERIFIED WITH: D. WOFFORD, PHARMD ([place]) AT 2035 ON 02/05/19 BY C. JESSUP, MT.    Candida glabrata NOT DETECTED NOT DETECTED Final   Candida krusei NOT DETECTED NOT DETECTED Final   Candida parapsilosis NOT DETECTED NOT DETECTED Final   Candida tropicalis NOT DETECTED NOT DETECTED Final    Comment: Performed at [facility], [place] [address], [place], [place] [address]  Culture, Urine     Status: None   Collection Time: 02/04/19  9:52 AM   Specimen: Urine, Catheterized  Result Value Ref Range Status   Specimen Description   Final    URINE, CATHETERIZED Performed at [facility] [address] [place], [place] [address]    Special Requests   Final    Immunocompromised Performed at [facility], [place] [address] [place], [place] [address]    Culture   Final    NO GROWTH Performed at [facility], [place] [address] [place], [place] [address]    Report Status 02/05/2019 FINAL  Final     Labs: BNP (last 3 results) No results for input(s): BNP in the last 8760 hours. Basic Metabolic Panel: Recent Labs  Lab 02/01/19 0404 02/03/19 0425 02/04/19 0458 02/05/19 0430  NA 139 143 147* 154*  K 3.8 3.2* 3.5 3.3*  CL 110 114* 119* 126*  CO2 15* 17* 18* 17*  GLUCOSE 113* 129* 172* 181*  BUN 25* 30* 33* 41*  CREATININE 1.66* 1.41* 1.49* 1.52*  CALCIUM 8.5* 8.6* 8.7* 8.4*  MG  --  1.6* 2.0 2.1  PHOS  --  3.4  --  3.2   Liver Function Tests: Recent Labs  Lab 02/01/19 0404 02/03/19 0425 02/04/19 0458 02/05/19 0430 02/05/19 0710  AST 94* 145* 366* 263*  --   ALT 46* 89* 207* 229*  --   ALKPHOS 65 81 101 152*  --   BILITOT 2.0* 2.1* 2.8* 2.4* 2.5*  PROT 5.7* 5.8* 5.9* 6.0*  --   ALBUMIN 2.0* 2.0* 1.9* 2.0*  --    No results for input(s): LIPASE, AMYLASE in the last 168 hours. Recent Labs  Lab 02/04/19 1552 02/05/19 0500  AMMONIA 31 30   CBC: Recent Labs  Lab 02/01/19 0404 02/02/19 0350 02/03/19 0425 02/04/19 0458  02/05/19 0430  WBC 0.2* 0.2* 0.4* 0.5* 0.7*  NEUTROABS  --  0.1*  --  0.3*  --   HGB 7.4* 7.1* 8.6* 8.6* 8.9*  HCT 22.1* 21.7* 25.5* 25.9* 27.0*  MCV 94.8 95.2 93.1 93.5 94.4  PLT 11* 20* 12* 28* 16*  Cardiac Enzymes: Recent Labs  Lab 02/03/19 1033  CKTOTAL 88   BNP: Invalid input(s): POCBNP CBG: Recent Labs  Lab 02/04/19 0742 02/04/19 1150 02/04/19 1630 02/04/19 2237 02/05/19 0743  GLUCAP 149* 155* 165* 134* 172*   D-Dimer No results for input(s): DDIMER in the last 72 hours. Hgb A1c No results for input(s): HGBA1C in the last 72 hours. Lipid Profile No results for input(s): CHOL, HDL, LDLCALC, TRIG, CHOLHDL, LDLDIRECT in the last 72 hours. Thyroid function studies Recent Labs    02/04/19 1552  TSH 3.902   Anemia work up No results for input(s): VITAMINB12, FOLATE, FERRITIN, TIBC, IRON, RETICCTPCT in the last 72 hours. Urinalysis    Component Value Date/Time   COLORURINE YELLOW 02/04/2019 0952   APPEARANCEUR CLEAR 02/04/2019 0952   LABSPEC 1.006 02/04/2019 0952   PHURINE 6.0 02/04/2019 0952   GLUCOSEU NEGATIVE 02/04/2019 0952   HGBUR MODERATE (A) 02/04/2019 0952   BILIRUBINUR NEGATIVE 02/04/2019 0952   KETONESUR 5 (A) 02/04/2019 0952   PROTEINUR NEGATIVE 02/04/2019 0952   UROBILINOGEN 1.0 12/16/2008 0451   NITRITE NEGATIVE 02/04/2019 0952   LEUKOCYTESUR NEGATIVE 02/04/2019 0952   Sepsis Labs Invalid input(s): PROCALCITONIN,  WBC,  LACTICIDVEN   Time coordinating discharge: 15 minutes  SIGNED:  Mercy Riding, MD  [facility] 03-09-19, 1:25 PM  If 7PM-7AM, please contact night-coverage [email or web address] Password [email or web address]

## 2019-03-06 NOTE — Progress Notes (Cosign Needed)
RN was called into room this am by family.  Patient expired with family at bedside. Time of death was 0437 am and verified by 2 RN's (myself and Oletha Cruel, RN).  Patient was comfort care.  He was non-responsive, no HR, no Respirations, fixed/dilated pupils.  MD, [facility], and [facility] notified.  Family is still at bedside at this time.  Once family is done visiting we will prepare the body and send patient to the morgue/ notify patient placement.Zachary Knox

## 2019-03-06 DEATH — deceased

## 2019-03-23 ENCOUNTER — Ambulatory Visit: Payer: Medicare Other | Admitting: Internal Medicine

## 2019-03-23 ENCOUNTER — Ambulatory Visit: Payer: Medicare Other

## 2019-03-23 ENCOUNTER — Other Ambulatory Visit: Payer: Medicare Other

## 2020-04-10 IMAGING — CT CT CHEST WITH CONTRAST
2 of 5 series · 13 of 36 positions shown, 16 images · IV contrast (APPLIED)
Comparison: October 12, 2018

CLINICAL DATA: Restaging lung cancer. Initial diagnosis July 2018.

EXAM:
CT CHEST, ABDOMEN, AND PELVIS WITH CONTRAST
TECHNIQUE: Multidetector CT imaging of the chest, abdomen and pelvis was
performed following the standard protocol during bolus
administration of intravenous contrast.
CONTRAST:  30mL OMNIPAQUE IOHEXOL 300 MG/ML SOLN, 100mL OMNIPAQUE
IOHEXOL 300 MG/ML SOLN

[Series 2: cap with · axial · 0.92mm/px · z∈[-273,+277]mm · 10 of 136 slices shown, 13 images]
[im 13/136  mediastinal]
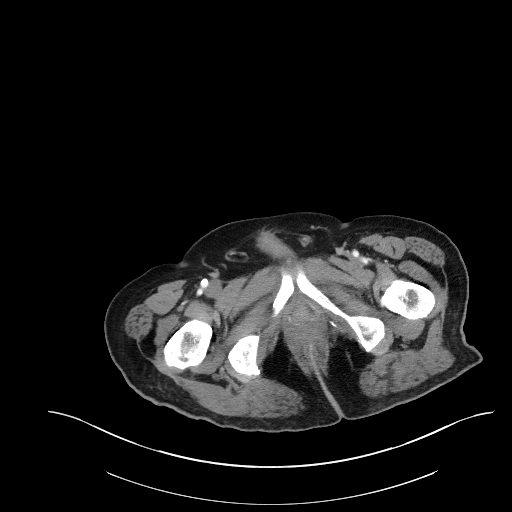
[im 13/136  lung]
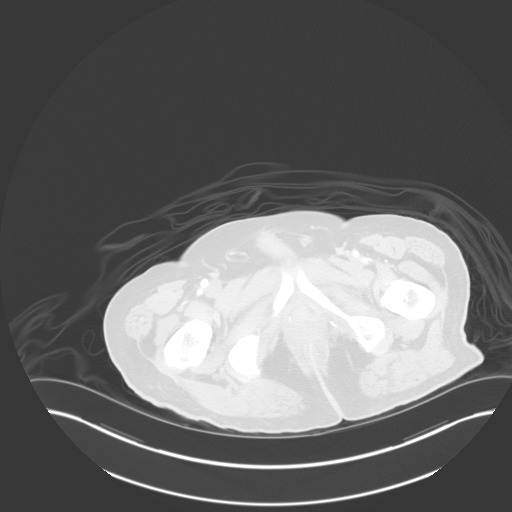
[im 25/136  lung]
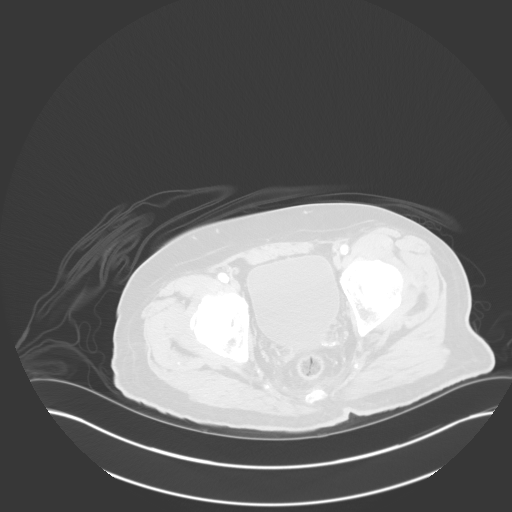
[im 37/136  lung]
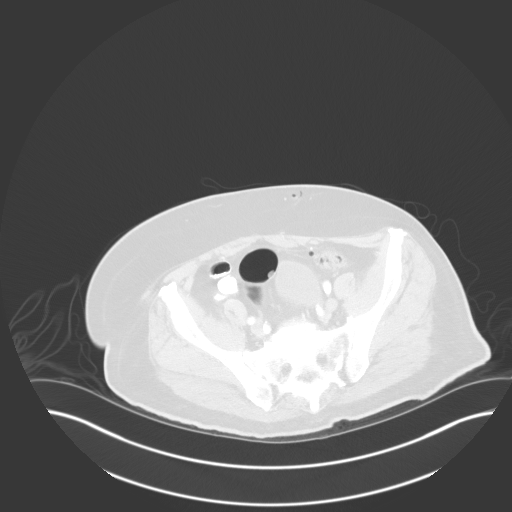
[im 50/136  lung]
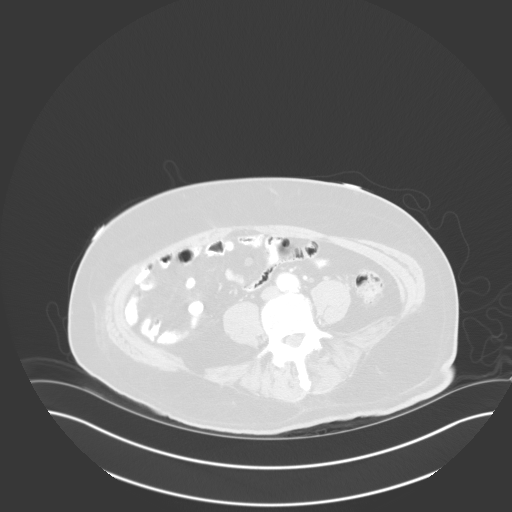
[im 62/136  mediastinal]
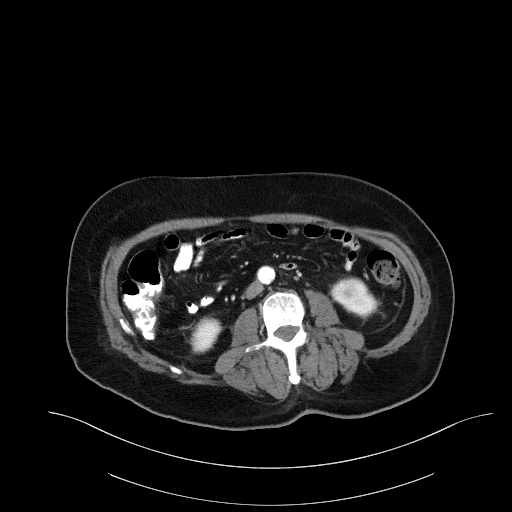
[im 62/136  lung]
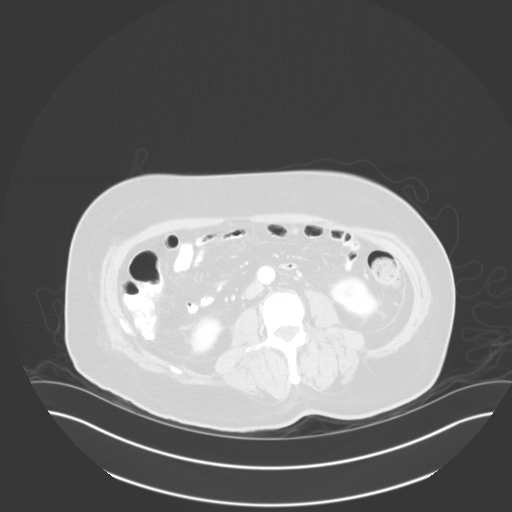
[im 74/136  lung]
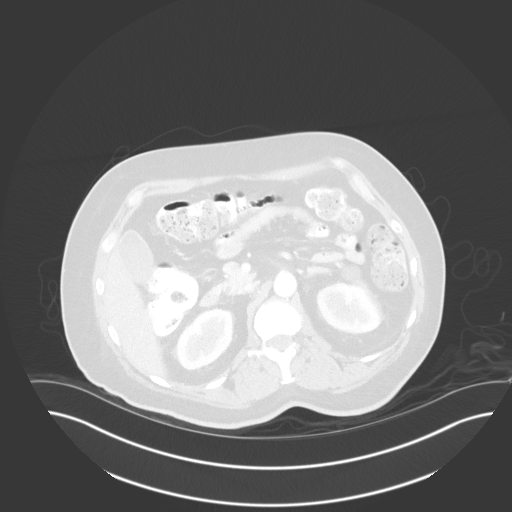
[im 86/136  lung]
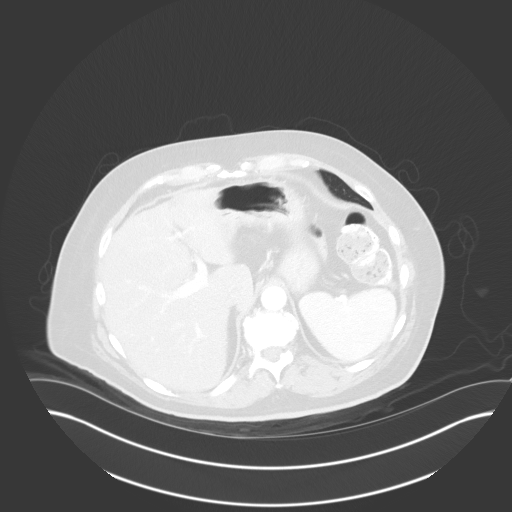
[im 99/136  lung]
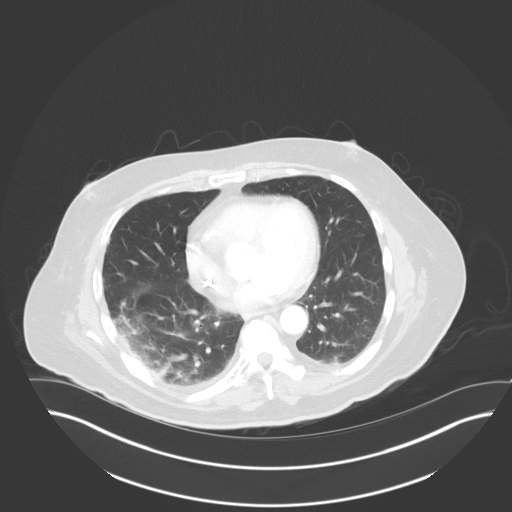
[im 111/136  mediastinal]
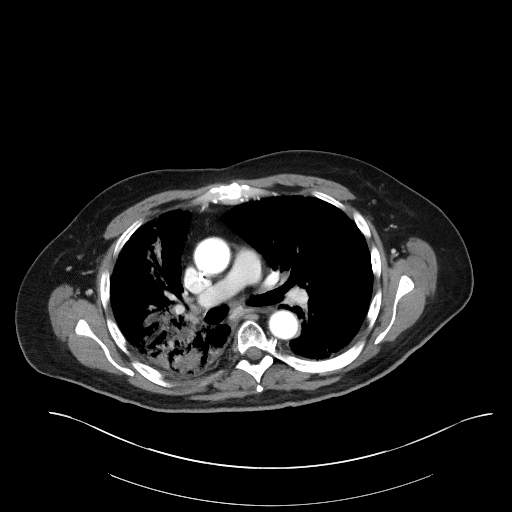
[im 111/136  lung]
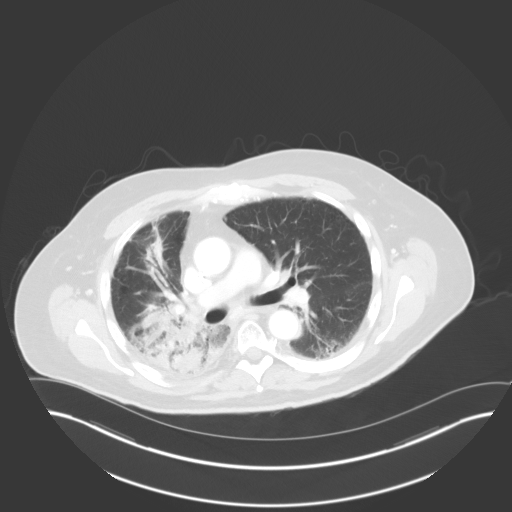
[im 123/136  lung]
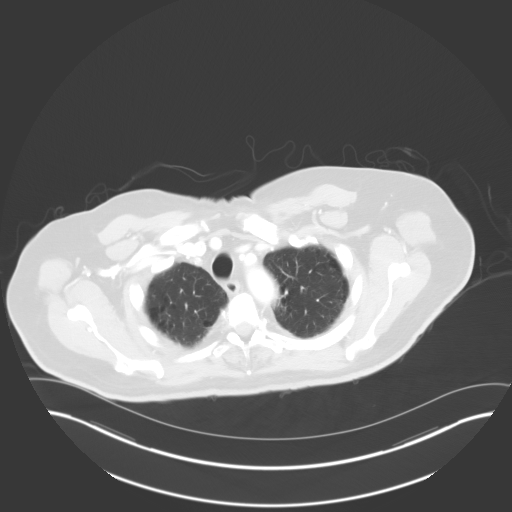

[Series 4: coronals · coronal · 0.82mm/px · 3 of 135 slices shown]
[im 27/135  lung]
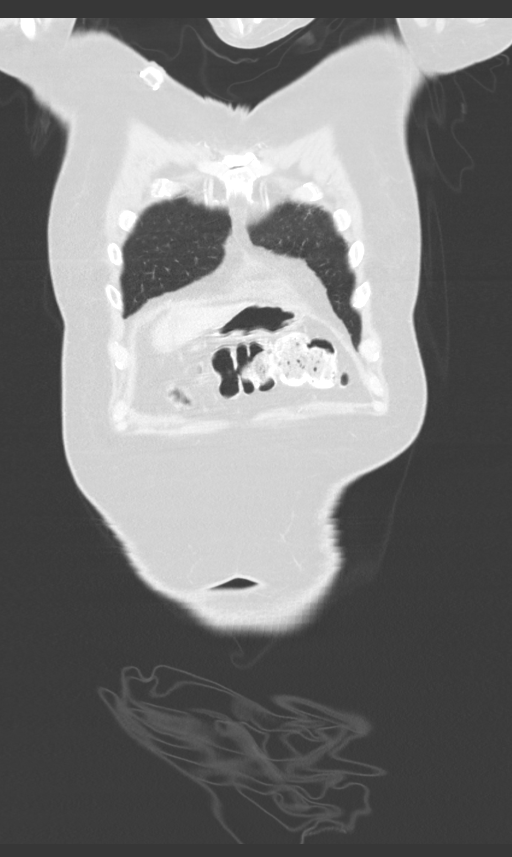
[im 54/135  lung]
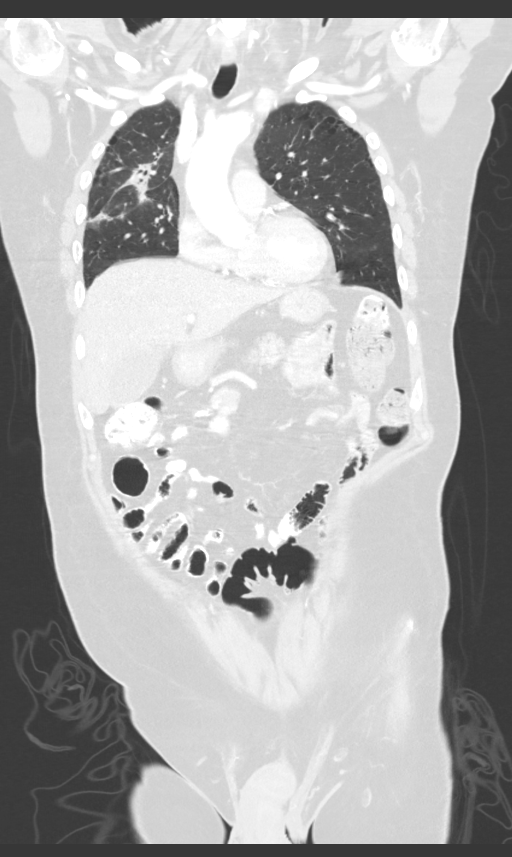
[im 81/135  lung]
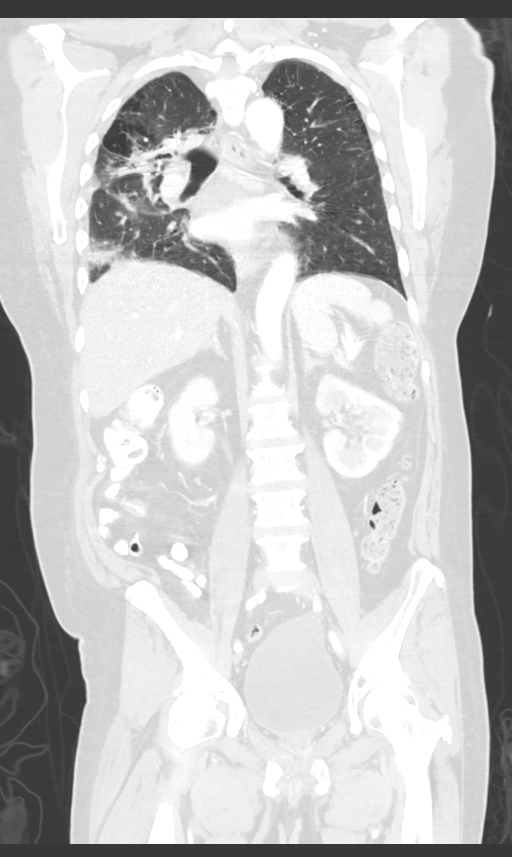

[13 of 36 positions shown; findings below may reference images not displayed]

FINDINGS: CT CHEST FINDINGS

Cardiovascular: Injectable port terminates at the cavoatrial
junction. Normal heart size. Minimal pericardial effusion. Calcific
atherosclerotic disease of the coronary arteries. Mild calcific
atherosclerotic disease of the aorta. No central pulmonary embolus.

Mediastinum/Nodes: No enlarged mediastinal, hilar, or axillary lymph
nodes. Thyroid gland, trachea, and esophagus demonstrate no
significant findings. Right precarinal lymph node measures 9 mm in
short axis, sub pathologic by CT criteria.

Lungs/Pleura: Marked upper lobe predominant paraseptal emphysema.
Interval development of near complete opacification of the superior
segment of the right lower lobe, which obscures the previously
demonstrated subpleural pulmonary nodule. Spiculated peribronchial
airspace opacity is now seen in the right middle lobe. Air
bronchograms within these abnormalities suggest consolidation.

Musculoskeletal: No chest wall mass or suspicious bone lesions
identified.

CT ABDOMEN PELVIS FINDINGS

Hepatobiliary: No focal liver abnormality is seen. No gallstones,
gallbladder wall thickening, or biliary dilatation.

Pancreas: Unremarkable. No pancreatic ductal dilatation or
surrounding inflammatory changes.

Spleen: Normal in size without focal abnormality.

Adrenals/Urinary Tract: Adrenal glands are unremarkable. Kidneys are
normal, without renal calculi, focal lesion, or hydronephrosis.
Right renal cyst. Bladder is unremarkable.

Stomach/Bowel: Stomach is within normal limits. Appendix appears
normal. No evidence of bowel wall thickening, distention, or
inflammatory changes.

Vascular/Lymphatic: Aortic atherosclerosis. No enlarged abdominal or
pelvic lymph nodes. 8 mm nonspecific central mesenteric lymph node,
image 87/136, sequence 2.

Reproductive: The prostate is nonenlarged.

Other: No abdominal wall hernia or abnormality. No abdominopelvic
ascites.

Musculoskeletal: Healing fracture of the right posterior eighth rib
within an area of sclerosis, likely pathologic fracture within a
metastatic lesion. No new lytic or sclerotic lesions.
IMPRESSION: 1. Interval development of airspace consolidation with near complete
opacification of the superior segment of the right lower lobe, which
obscures the previously demonstrated subpleural pulmonary
malignancy.
2. Interval development of additional peribronchial airspace
consolidation in the right middle lobe.
3. No evidence of metastatic disease to the abdomen or pelvis.
4. Healing pathologic fracture of the right posterior eighth rib.

Aortic Atherosclerosis (OCENH-XKO.O) and Emphysema ([id number]-[id number].G).
